# Patient Record
Sex: Male | Born: 1945
Health system: Southern US, Community
[De-identification: ages and names within clinical notes are randomized; demographics above are authoritative.]

## PROBLEM LIST (undated history)

## (undated) DIAGNOSIS — F419 Anxiety disorder, unspecified: Secondary | ICD-10-CM

## (undated) DIAGNOSIS — F32A Depression, unspecified: Secondary | ICD-10-CM

## (undated) DIAGNOSIS — K2270 Barrett's esophagus without dysplasia: Secondary | ICD-10-CM

## (undated) DIAGNOSIS — J9601 Acute respiratory failure with hypoxia: Secondary | ICD-10-CM

## (undated) DIAGNOSIS — G709 Myoneural disorder, unspecified: Secondary | ICD-10-CM

## (undated) DIAGNOSIS — G4730 Sleep apnea, unspecified: Secondary | ICD-10-CM

## (undated) DIAGNOSIS — I10 Essential (primary) hypertension: Secondary | ICD-10-CM

## (undated) DIAGNOSIS — K219 Gastro-esophageal reflux disease without esophagitis: Secondary | ICD-10-CM

## (undated) DIAGNOSIS — R112 Nausea with vomiting, unspecified: Secondary | ICD-10-CM

## (undated) DIAGNOSIS — E119 Type 2 diabetes mellitus without complications: Secondary | ICD-10-CM

## (undated) DIAGNOSIS — Z9889 Other specified postprocedural states: Secondary | ICD-10-CM

## (undated) DIAGNOSIS — M1990 Unspecified osteoarthritis, unspecified site: Secondary | ICD-10-CM

## (undated) DIAGNOSIS — F329 Major depressive disorder, single episode, unspecified: Secondary | ICD-10-CM

## (undated) DIAGNOSIS — N179 Acute kidney failure, unspecified: Secondary | ICD-10-CM

## (undated) DIAGNOSIS — K8000 Calculus of gallbladder with acute cholecystitis without obstruction: Secondary | ICD-10-CM

## (undated) HISTORY — DX: Acute respiratory failure with hypoxia: J96.01

## (undated) HISTORY — DX: Gastro-esophageal reflux disease without esophagitis: K21.9

## (undated) HISTORY — DX: Type 2 diabetes mellitus without complications: E11.9

## (undated) HISTORY — DX: Calculus of gallbladder with acute cholecystitis without obstruction: K80.00

## (undated) HISTORY — DX: Unspecified osteoarthritis, unspecified site: M19.90

## (undated) HISTORY — DX: Sleep apnea, unspecified: G47.30

## (undated) HISTORY — DX: Myoneural disorder, unspecified: G70.9

## (undated) HISTORY — PX: ESOPHAGOGASTRODUODENOSCOPY: SHX1529

## (undated) HISTORY — DX: Anxiety disorder, unspecified: F41.9

## (undated) HISTORY — PX: BACK SURGERY: SHX140

## (undated) HISTORY — PX: COLONOSCOPY: SHX174

## (undated) HISTORY — DX: Depression, unspecified: F32.A

## (undated) HISTORY — PX: REPLACEMENT TOTAL KNEE: SUR1224

## (undated) HISTORY — DX: Major depressive disorder, single episode, unspecified: F32.9

## (undated) HISTORY — PX: ROTATOR CUFF REPAIR: SHX139

## (undated) HISTORY — DX: Essential (primary) hypertension: I10

---

## 1999-09-24 ENCOUNTER — Inpatient Hospital Stay (HOSPITAL_COMMUNITY): Admission: EM | Admit: 1999-09-24 | Discharge: 1999-09-27 | Payer: Self-pay | Admitting: Emergency Medicine

## 1999-09-24 ENCOUNTER — Encounter: Payer: Self-pay | Admitting: Emergency Medicine

## 1999-09-25 ENCOUNTER — Encounter: Payer: Self-pay | Admitting: Nephrology

## 2000-07-30 ENCOUNTER — Ambulatory Visit (HOSPITAL_COMMUNITY): Admission: RE | Admit: 2000-07-30 | Discharge: 2000-07-30 | Payer: Self-pay | Admitting: Orthopedic Surgery

## 2000-07-30 ENCOUNTER — Encounter: Payer: Self-pay | Admitting: Orthopedic Surgery

## 2003-05-18 ENCOUNTER — Encounter: Admission: RE | Admit: 2003-05-18 | Discharge: 2003-08-16 | Payer: Self-pay | Admitting: Family Medicine

## 2003-07-12 ENCOUNTER — Encounter (INDEPENDENT_AMBULATORY_CARE_PROVIDER_SITE_OTHER): Payer: Self-pay | Admitting: *Deleted

## 2003-07-12 ENCOUNTER — Ambulatory Visit (HOSPITAL_COMMUNITY): Admission: RE | Admit: 2003-07-12 | Discharge: 2003-07-12 | Payer: Self-pay | Admitting: *Deleted

## 2006-07-23 ENCOUNTER — Encounter: Admission: RE | Admit: 2006-07-23 | Discharge: 2006-07-23 | Payer: Self-pay | Admitting: Orthopedic Surgery

## 2006-07-31 ENCOUNTER — Ambulatory Visit (HOSPITAL_BASED_OUTPATIENT_CLINIC_OR_DEPARTMENT_OTHER): Admission: RE | Admit: 2006-07-31 | Discharge: 2006-07-31 | Payer: Self-pay | Admitting: Orthopedic Surgery

## 2007-04-17 ENCOUNTER — Inpatient Hospital Stay (HOSPITAL_COMMUNITY): Admission: RE | Admit: 2007-04-17 | Discharge: 2007-04-20 | Payer: Self-pay | Admitting: Orthopedic Surgery

## 2009-03-12 ENCOUNTER — Ambulatory Visit: Payer: Self-pay | Admitting: Family Medicine

## 2009-03-12 DIAGNOSIS — I10 Essential (primary) hypertension: Secondary | ICD-10-CM

## 2009-03-12 DIAGNOSIS — R9431 Abnormal electrocardiogram [ECG] [EKG]: Secondary | ICD-10-CM

## 2009-03-12 HISTORY — DX: Abnormal electrocardiogram (ECG) (EKG): R94.31

## 2009-03-12 HISTORY — DX: Essential (primary) hypertension: I10

## 2009-03-14 ENCOUNTER — Encounter: Payer: Self-pay | Admitting: Family Medicine

## 2011-01-30 NOTE — H&P (Signed)
[name], SAR NO.:  [id number]   MEDICAL RECORD NO.:  [id number]           PATIENT TYPE:   LOCATION:                                 FACILITY:   PHYSICIAN:  Vania Rea. Supple, M.D.  DATE OF BIRTH:  March 26, 1946   DATE OF ADMISSION:  04/17/2007  DATE OF DISCHARGE:                              HISTORY & PHYSICAL   CHIEF COMPLAINT:  Left knee pain.   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  He has now failed outpatient conservative treatment, including  arthroscopy, as well as multiple injections, as well as treatment with a  functional osteoarthrosis knee brace.  He continues to have significant  pain and limitations and his ability to get around and it is impacting  his quality of life.  At this time, risks and benefits were discussed at  length for a total knee arthroplasty and he agrees and wishes to  proceed.   PAST MEDICAL HISTORY:  1. Non-insulin-dependent diabetes.  2. Hypertension.  3. Anxiety, depression.   PAST SURGICAL HISTORY:  1. Lumbar diskectomy x2.  2. Two previous right rotator cuff repairs.  3. Previous left knee arthroscopy.   CURRENT MEDICATIONS:  Include metformin, Glipizide, Nexium, Altace, a  diuretic he does not remember the name of and Xanax.   ALLERGIES:  NO KNOWN DRUG ALLERGIES.   SOCIAL HISTORY:  He does not drink or smoke.  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  He has family and friends that will be helping him  postoperatively in the home.  He has already been contacted by [place].   REVIEW OF SYSTEMS:  Patient denies recent fevers, chills, night sweats,  no bleeding tendency, has no blurred or double vision, history of  paralysis.  RESPIRATORY:  No shortness of breath, productive cough or  hemoptysis.  CARDIOVASCULAR:  No chest pain or orthopnea.  GI:  No  nausea, vomiting, constipation, occasional diarrhea, no melena or  bloody  stools.  GENITOURINARY:  No dysuria or hematuria.  MUSCULOSKELETAL:  As  per history of present illness.   PHYSICAL EXAMINATION:  GENERAL:  Timothy Chapman is a moderately overweight  65 year old gentleman in no apparent distress.  VITAL SIGNS:  Blood pressure today is 158/82.  HEENT:  Normocephalic.  Extraocular motions intact.  NECK:  Supple.  No  lymphadenopathy.  No carotid bruits.  CHEST:  Clear to auscultation bilaterally.  No rales, no rhonchi.  HEART:  Regular rate and rhythm.  ABDOMEN:  Moderately overweight, but positive bowel sounds.  Nontender.  EXTREMITIES:  Painful work of motion of the left knee with diffuse joint  line tenderness.  He is neurovascularly intact distally with no evidence  of pitting edema.   IMPRESSION:  1. Left knee osteoarthrosis.  2. Hypertension.  3. Non-insulin-dependent diabetes.  4. Anxiety, depression.   PLAN:  At this time, we will proceed with left total knee arthroplasty.      Tracy A. Shuford, P.A.-C.      Vania Rea. Supple, M.D.  Electronically Signed    TAS/MEDQ  D:  04/07/2007  T:  04/07/2007  Job:  [id number]

## 2011-01-30 NOTE — Op Note (Signed)
NAMECHRISTIANO, BLANDON NO.:  [id number]   MEDICAL RECORD NO.:  [id number]          PATIENT TYPE:  INP   LOCATION:  2899                         FACILITY:  [facility]   PHYSICIAN:  Vania Rea. Supple, M.D.  DATE OF BIRTH:  [birth year]   DATE OF PROCEDURE:  04/17/2007  DATE OF DISCHARGE:                               OPERATIVE REPORT   PREOPERATIVE DIAGNOSIS:  Left knee osteoarthrosis.   POSTOPERATIVE DIAGNOSIS:  Left knee osteoarthrosis.   PROCEDURE PERFORMED:  Left cemented total knee arthroplasty utilizing a  DePuy Sigma implant, size 4 femur, size 4 tibia, a 10 mm thick rotating  platform with polyethylene insert, and a 35 mm patellar button.   SURGEON:  Vania Rea. Supple, M.D.   ASSISTANT SURGEON:  French Ana A. Shuford, P.A.-C.   ANESTHESIA:  General endotracheal as well as a preoperative femoral  nerve block.   TOURNIQUET TIME:  Tourniquet time was one hour and 14 minutes.   ESTIMATED BLOOD LOSS:  150 mL.   DRAINS:  A Hemovac x one.   HISTORY:  Mr. Haub is a 65 year old gentleman who has chronic and  progressively increasing left knee pain secondary to underlying  arthrosis.  His symptoms had been refractory to prolonged attempts at  conservative management and, due to his ongoing pain and functional  limitations, he is brought to the operating room at this time for a  planned left total knee replacement as described below.   Preoperatively I counseled Mr. Bracknell on treatment options as well as  risks versus benefits throughout.  Possible surgical complications of  bleeding, infection, neurovascular injury, DVT, PE, persistence of pain,  loss of motion, and possible need for revision surgery were all  reviewed.  He understands and accepts and agrees with our plan.   DESCRIPTION OF PROCEDURE IN DETAIL:  After undergoing routine preop  evaluation, the patient received prophylactic antibiotics.  A femoral  nerve block was established in the holding area by the  anesthesia  department.  Placed supine on the operating table and underwent  establishment of a general endotracheal anesthesia.  Received  prophylactic antibiotics.  A Foley catheter was placed.  The tourniquet  was placed on the left thigh and the left leg was sterilely prepped and  draped in the standard fashion.  The leg was exsanguinated with the  tourniquet inflated to 350 mmHg.  An anterior midline incision was then  made approximately 15 cm in length centered over the patella.  Skin  flaps were circumferentially mobilized and sutured back.  A medial  peripatellar arthrotomy was performed.  The patella was everted and the  infrapatellar fat pad was excised and the patella was everted and the  knee was then flexed up.  Remnants of the menisci were removed and a  slight medial release was also performed of the capsule tissues from the  medial tibial plateau.  A drill was then used to gain access to the  femoral canal and an intramedullary guide was passed.  This was then  used to place a cutting guide for this femoral cut where we removed 11  mm of bone at a 5-degree valgus angulation.  The distal pin was then  measured and had a size 4 fit.  The size 4 cutting guide was then placed  and the anterior, posterior and Chamfer cuts were then made with an  oscillating saw.  A trial size 4 implant showed excellent fit.   Attention was then turned to the tibia where using an extramedullary  guide we removed 10 mm of bone from the tibia, maintaining neutral  alignment.  We then confirmed that the tibial plateau was appropriately  cut and exposed.  This was then trialed and the size 4 had the best fit.  With the size 4 tray pinned in position, we performed a trial reduction  and there were excellent soft tissue balance, excellent knee motion, and  symmetric flexion and extension gaps.  The tibia was then terminally  prepared with the reamer and broach.  Attention was then returned to the   distal femur where the box cutting guide was then used to resect the box  cut out of the distal femur.  We then trialed the implant with the box  and it showed excellent fit.   Attention was then turned to the patella where an oscillating saw was  then used to resect approximately 8.5 mm of bone, and then the  stabilizing drill holes were drilled.  The patellar button, 35 mm,  showed a good fit.  At this point the posterior femoral condyles were  then cleaned and an osteotome was used to remove the bony prominences  and then a rongeur was used to debride the residual soft tissue and  meniscus from the posteromedial and posterolateral compartments of all  residual bone and debris.  The joint was then pulsatilely lavaged and  meticulously cleaned.   Cement was then mixed and at the appropriate consistency we cemented the  implants into position, beginning with the tibia and then the femur and  then the patella.  Meticulous removal of all extra cement was completed.  Once again, trial reduction showed excellent soft tissue balance, good  knee mobility, and good stability.  The trial was removed and the final  10 mm rotating platform with polyethylene insert was then placed.  The  knee was taken through a final range of motion with good patellar  stability and good tracking.  A Hemovac drain was brought out  superolaterally.  The tourniquet was let down.  Hemostasis was obtained.  The peripatellar arthrotomy was closed with a series of figure-of-eight  #1 Vicryl sutures; 2-0 Vicryl was used for the subcu and the  intracuticular; with 3-0 Monocryl for skin; followed by Steri-Strips.  A  dry dressing was applied.  The patient was placed in a knee immobilizer  and then extubated and taken to the recovery room in stable condition.      Vania Rea. Supple, M.D.  Electronically Signed     KMS/MEDQ  D:  04/17/2007  T:  04/17/2007  Job:  [id number]

## 2011-02-02 NOTE — Discharge Summary (Signed)
NAMEDIETER, HANE NO.:  [id number]   MEDICAL RECORD NO.:  [id number]          PATIENT TYPE:  INP   LOCATION:  5020                         FACILITY:  [facility]   PHYSICIAN:  Vania Rea. Supple, M.D.  DATE OF BIRTH:  May 05, 1946   DATE OF ADMISSION:  04/17/2007  DATE OF DISCHARGE:  04/20/2007                               DISCHARGE SUMMARY   ADMISSION DIAGNOSES:  1. End-stage osteoarthrosis of the left knee.  2. Non-insulin-dependent diabetes.  3. Hypertension.  4. Anxiety and depression.   DISCHARGE DIAGNOSES:  1. End-stage osteoarthritis of the left knee, status post left total      knee arthroplasty.  2. Non-insulin-dependent diabetes.  3. Hypertension.  4. Anxiety and depression.   OPERATIONS:  Left total knee arthroplasty.   SURGEON:  Dr. Francena Hanly.   ASSISTANT:  Lucita Lora. Shuford, PA-C.   ANESTHESIA:  General anesthetic with femoral nerve block.   BRIEF HISTORY:  Mr. Vallejo is a pleasant 65 year old gentleman who has  failed outpatient conservative measures for ongoing difficulties with  his left knee.  He has been found to have end-stage osteoarthrosis by  radiograph.  His quality of life was significantly affected, and,  therefore, a total knee arthroplasty was indicated.  Risks and benefits  discussed with the patient, and he is in agreement and wishes to  proceed.   HOSPITAL COURSE:  The patient was admitted, underwent above-named  procedure, and tolerated it well.  All appropriate IV antibiotics,  analgesics were provided.  Postoperatively, he was placed on DVT and PE  prophylaxis with Coumadin and started in total knee arthroplasty  protocol.  He was allowed weightbearing as tolerated.  He did have mild  hyponatremia which resolved with free water restriction.  Otherwise, he  hemodynamically remained stable.  He did have significant pain control  difficulties initially.  These were improved throughout his first  postoperative weekend.  By  date April 20, 2007, he was doing well except  for he had had no bowel movement.  He was passing flatus, is  neurovascularly intact, was afebrile, and he had good results with  laxative and enemas.  His incision was clean and dry, had minimal  swelling.  He was found to be medically and orthopedically stable and  ready for discharge to home.   LABORATORY DATA:  Admission labs:  Hemogram within normal limits, a  hemoglobin of 14.5; postoperatively remained stable at 11.2, 10.4, and  10.2.  Chemistries also in the chart showed mild hyponatremia at 133.  Pro-times, INRs followed by pharmacy on Coumadin, and urinalysis  negative.  EKG showed normal sinus rhythm.  Chest x-ray not found at  time of dictation.   CONDITION ON DISCHARGE:  Stable and improved.   DISCHARGE PLAN:  Discharge to home and resume his home medicines other  than  antiinflammatories, Coumadin for a total of 3 weeks.  Prescriptions for Percocet, Robaxin, Ambien, and Coumadin are provided.  He has [facility] providing home health services.  Encouraged to  follow up in our office in 2 weeks.  May shower on postoperative day  5.  Call our office for any further details.      Tracy A. Shuford, P.A.-C.      Vania Rea. Supple, M.D.  Electronically Signed    TAS/MEDQ  D:  06/19/2007  T:  06/19/2007  Job:  [id number]

## 2011-02-02 NOTE — Op Note (Signed)
Timothy Chapman, Timothy Chapman               ACCOUNT NO.:  [id number]   MEDICAL RECORD NO.:  [id number]          PATIENT TYPE:  AMB   LOCATION:  DSC                          FACILITY:  [facility]   PHYSICIAN:  Mila Homer. Sherlean Foot, M.D. DATE OF BIRTH:  [birth year]   DATE OF PROCEDURE:  DATE OF DISCHARGE:                                 OPERATIVE REPORT   PREOPERATIVE DIAGNOSES:  Left knee osteoarthritis, medial meniscal tear.   POSTOPERATIVE DIAGNOSES:  Left knee osteoarthritis, Plica syndrome.   SURGEON:  Mila Homer. Sherlean Foot, M.D.   ASSISTANT:  None.   ANESTHESIA:  General, laryngeal mask anesthesia.   INDICATION FOR PROCEDURE:  Patient is a 65 year-old with cancer symptoms  plus more chronic arthritic symptoms. MRI shows medial meniscus tear.  Informed consent obtained.   DESCRIPTION OF PROCEDURE:  The patient was laid supine and administered  general LMA anesthesia.  The left lower extremity was prepped and draped in  the usual sterile fashion.  Anterolateral and anteromedial portals were  created with a #11 blade, blunt trocar, and cannula.  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 There was a very large synovitic hypertrophied  plica, and I performed a plica resection with the [employer] shaver.  I  went into flexion, and there was completely eburnated bone over much of the  medial formal condyle, loose pieces floating in the nave from this lesion,  obviously the articular cartilage had been flaking off from this lesion for  quite some time.  There was a posterior horn medial meniscal root tear.  The  loose debris was debrided with the [facility] shaver and then the partial  medial meniscectomy was performed with the [employer] shaver and straight  vascular forceps.  The lateral compartment was actually pretty normal with  only grade 1 chondromalacia.  The ACL and PCL were normal as well.  I  performed an  aggressive chondroplasty of the medial femoral condylar lesion.  It measured 4 x 4 cm in circumference.  I then used a 0.62 mm K-wire and  drilled approximately 10 holes in this lesion.  Then, lavaged and closed  with 4-0 nylon suture.  The wound was dressed with Xeroform dressing,  sponges, Webril, and an Ace wrap.           ______________________________  Mila Homer. Sherlean Foot, M.D.     SDL/MEDQ  D:  07/31/2006  T:  08/01/2006  Job:  [id number]

## 2011-07-02 LAB — CBC
HCT: 33.2 — ABNORMAL LOW
HCT: 42.3
Hemoglobin: 10.2 — ABNORMAL LOW
Hemoglobin: 11.2 — ABNORMAL LOW
MCHC: 33.6
MCHC: 34.4
MCHC: 35.1
MCV: 83.9
MCV: 84.1
MCV: 83.8
Platelets: 218
Platelets: 235
Platelets: 304
RBC: 3.61 — ABNORMAL LOW
RDW: 13.2
RDW: 13.5
RDW: 13.5
RDW: 13.4
WBC: 8.4

## 2011-07-02 LAB — DIFFERENTIAL
Eosinophils Relative: 2
Lymphocytes Relative: 27
Monocytes Absolute: 0.6
Monocytes Relative: 8
Neutro Abs: 5.3

## 2011-07-02 LAB — URINALYSIS, ROUTINE W REFLEX MICROSCOPIC
Ketones, ur: 15 — AB
Nitrite: NEGATIVE
Specific Gravity, Urine: 1.03
pH: 5

## 2011-07-02 LAB — BASIC METABOLIC PANEL
BUN: 12
BUN: 9
CO2: 27
CO2: 30
Calcium: 8.6
Chloride: 99
Creatinine, Ser: 0.85
GFR calc non Af Amer: >60
Glucose, Bld: 163 — ABNORMAL HIGH
Glucose, Bld: 173 — ABNORMAL HIGH
Potassium: 4.2
Sodium: 133 — ABNORMAL LOW

## 2011-07-02 LAB — PROTIME-INR
INR: 1.9 — ABNORMAL HIGH
INR: 1.9 — ABNORMAL HIGH
Prothrombin Time: 22.4 — ABNORMAL HIGH

## 2011-07-02 LAB — COMPREHENSIVE METABOLIC PANEL
AST: 28
Albumin: 4.1
BUN: 25 — ABNORMAL HIGH
Creatinine, Ser: 1.1
GFR calc Af Amer: >60
Potassium: 4.2
Total Protein: 7.2

## 2011-07-02 LAB — APTT: aPTT: 36

## 2014-08-26 ENCOUNTER — Other Ambulatory Visit (HOSPITAL_COMMUNITY): Payer: Self-pay | Admitting: Neurosurgery

## 2014-09-29 ENCOUNTER — Inpatient Hospital Stay (HOSPITAL_COMMUNITY): Admission: RE | Admit: 2014-09-29 | Payer: Self-pay | Source: Ambulatory Visit

## 2014-10-08 ENCOUNTER — Encounter (HOSPITAL_COMMUNITY): Admission: RE | Payer: Self-pay | Source: Ambulatory Visit

## 2014-10-08 ENCOUNTER — Inpatient Hospital Stay (HOSPITAL_COMMUNITY): Admission: RE | Admit: 2014-10-08 | Payer: Self-pay | Source: Ambulatory Visit | Admitting: Neurosurgery

## 2014-10-08 SURGERY — POSTERIOR LUMBAR FUSION 1 LEVEL
Anesthesia: General

## 2015-04-25 DIAGNOSIS — Z5189 Encounter for other specified aftercare: Secondary | ICD-10-CM | POA: Insufficient documentation

## 2015-04-25 HISTORY — DX: Encounter for other specified aftercare: Z51.89

## 2015-09-18 HISTORY — PX: PROSTATE SURGERY: SHX751

## 2015-09-26 DIAGNOSIS — E291 Testicular hypofunction: Secondary | ICD-10-CM

## 2015-09-26 DIAGNOSIS — R972 Elevated prostate specific antigen [PSA]: Secondary | ICD-10-CM

## 2015-09-26 HISTORY — DX: Elevated prostate specific antigen (PSA): R97.20

## 2015-09-26 HISTORY — DX: Testicular hypofunction: E29.1

## 2015-10-06 DIAGNOSIS — E1142 Type 2 diabetes mellitus with diabetic polyneuropathy: Secondary | ICD-10-CM | POA: Insufficient documentation

## 2015-10-06 DIAGNOSIS — F5232 Male orgasmic disorder: Secondary | ICD-10-CM

## 2015-10-06 HISTORY — DX: Type 2 diabetes mellitus with diabetic polyneuropathy: E11.42

## 2015-10-06 HISTORY — DX: Male orgasmic disorder: F52.32

## 2016-01-25 ENCOUNTER — Telehealth: Payer: Self-pay

## 2016-01-25 NOTE — Telephone Encounter (Signed)
Felisha from [facility] - called in to get pt established with provider. She says that pt will be traveling to the [place] and would like to know if he can have Chloroquine 90 day supply? (covered by insurance)

## 2016-02-07 ENCOUNTER — Telehealth: Payer: Self-pay

## 2016-02-07 NOTE — Telephone Encounter (Signed)
Pre visit call made to patient.

## 2016-02-08 ENCOUNTER — Encounter: Payer: Self-pay | Admitting: Family Medicine

## 2016-02-08 ENCOUNTER — Ambulatory Visit (INDEPENDENT_AMBULATORY_CARE_PROVIDER_SITE_OTHER): Payer: Medicare Other | Admitting: Family Medicine

## 2016-02-08 VITALS — BP 130/84 | HR 92 | Temp 98.9°F | Ht 69.75 in | Wt 254.2 lb

## 2016-02-08 DIAGNOSIS — K219 Gastro-esophageal reflux disease without esophagitis: Secondary | ICD-10-CM

## 2016-02-08 DIAGNOSIS — N529 Male erectile dysfunction, unspecified: Secondary | ICD-10-CM

## 2016-02-08 DIAGNOSIS — G4700 Insomnia, unspecified: Secondary | ICD-10-CM

## 2016-02-08 DIAGNOSIS — Z119 Encounter for screening for infectious and parasitic diseases, unspecified: Secondary | ICD-10-CM | POA: Diagnosis not present

## 2016-02-08 DIAGNOSIS — N400 Enlarged prostate without lower urinary tract symptoms: Secondary | ICD-10-CM

## 2016-02-08 DIAGNOSIS — Z8719 Personal history of other diseases of the digestive system: Secondary | ICD-10-CM

## 2016-02-08 DIAGNOSIS — E1140 Type 2 diabetes mellitus with diabetic neuropathy, unspecified: Secondary | ICD-10-CM

## 2016-02-08 DIAGNOSIS — G8929 Other chronic pain: Secondary | ICD-10-CM

## 2016-02-08 DIAGNOSIS — E785 Hyperlipidemia, unspecified: Secondary | ICD-10-CM

## 2016-02-08 DIAGNOSIS — Z7189 Other specified counseling: Secondary | ICD-10-CM

## 2016-02-08 DIAGNOSIS — M549 Dorsalgia, unspecified: Secondary | ICD-10-CM | POA: Diagnosis not present

## 2016-02-08 DIAGNOSIS — F5101 Primary insomnia: Secondary | ICD-10-CM

## 2016-02-08 DIAGNOSIS — Z7184 Encounter for health counseling related to travel: Secondary | ICD-10-CM

## 2016-02-08 DIAGNOSIS — I10 Essential (primary) hypertension: Secondary | ICD-10-CM

## 2016-02-08 HISTORY — DX: Type 2 diabetes mellitus with diabetic neuropathy, unspecified: E11.40

## 2016-02-08 HISTORY — DX: Insomnia, unspecified: G47.00

## 2016-02-08 HISTORY — DX: Personal history of other diseases of the digestive system: Z87.19

## 2016-02-08 HISTORY — DX: Other chronic pain: G89.29

## 2016-02-08 LAB — COMPREHENSIVE METABOLIC PANEL
ALK PHOS: 37 U/L — AB (ref 39–117)
ALT: 41 U/L (ref 0–53)
AST: 27 U/L (ref 0–37)
Albumin: 4.5 g/dL (ref 3.5–5.2)
BILIRUBIN TOTAL: 0.4 mg/dL (ref 0.2–1.2)
BUN: 27 mg/dL — AB (ref 6–23)
CALCIUM: 9.9 mg/dL (ref 8.4–10.5)
CO2: 25 meq/L (ref 19–32)
CREATININE: 1.08 mg/dL (ref 0.40–1.50)
Chloride: 102 mEq/L (ref 96–112)
GFR: 72.37 mL/min (ref >60.00)
GLUCOSE: 162 mg/dL — AB (ref 70–99)
Potassium: 4.3 mEq/L (ref 3.5–5.1)
Sodium: 137 mEq/L (ref 135–145)
Total Protein: 7.2 g/dL (ref 6.0–8.3)

## 2016-02-08 LAB — LIPID PANEL
Cholesterol: 180 mg/dL (ref 0–200)
HDL: 24.3 mg/dL — AB (ref >39.00)
NONHDL: 155.58
Total CHOL/HDL Ratio: 7
Triglycerides: 394 mg/dL — ABNORMAL HIGH (ref 0.0–149.0)
VLDL: 78.8 mg/dL — ABNORMAL HIGH (ref 0.0–40.0)

## 2016-02-08 LAB — PSA: PSA: 5.11 ng/mL — ABNORMAL HIGH (ref 0.10–4.00)

## 2016-02-08 LAB — LDL CHOLESTEROL, DIRECT: LDL DIRECT: 104 mg/dL

## 2016-02-08 LAB — HEMOGLOBIN A1C: Hgb A1c MFr Bld: 7.2 % — ABNORMAL HIGH (ref 4.6–6.5)

## 2016-02-08 MED ORDER — ALPRAZOLAM 0.5 MG PO TABS: ORAL_TABLET | ORAL | Status: DC

## 2016-02-08 MED ORDER — NIACIN ER (ANTIHYPERLIPIDEMIC) 500 MG PO TBCR: 500.0000 mg | EXTENDED_RELEASE_TABLET | Freq: Every day | ORAL | Status: DC

## 2016-02-08 MED ORDER — DOXYCYCLINE HYCLATE 100 MG PO TABS: 100.0000 mg | ORAL_TABLET | Freq: Every day | ORAL | Status: DC

## 2016-02-08 MED ORDER — HYDROCODONE-ACETAMINOPHEN 7.5-325 MG PO TABS: 1.0000 | ORAL_TABLET | Freq: Three times a day (TID) | ORAL | Status: DC | PRN

## 2016-02-08 MED FILL — HYDROCODON-APAP 7.5-325: 7.5-325 | 14 days supply | Qty: 40 | Fill #0

## 2016-02-08 NOTE — Progress Notes (Signed)
[facility] [address] [place], [place] [address] [phone] [id number]  Date:  02/08/2016   Name:  Johaan [facility]   DOB:  Oct 14, 1945   MRN:  [id number]  PCP:  Lamar Blinks, MD    Chief Complaint: Establish Care   History of Present Illness:  Timothy Chapman is a 70 y.o. very pleasant male patient who presents with the following:  Here today to establish care.  History of HTN, DM, GERD, OSA. He has decided to see [place] as his PCP. His DM is treated with glipizide, victoza, metformin He states that his biggest issue is chronic insomnia- he uses melatonin for this.  He has used xanax for this in the past- he has tried trazodone but this did not seem to work.  He prefers the xanax and would like to have some but the [place] will not rx this for him   He is also a pt at the [place].   He will visit the [place] soon and needs malaria prophylaxis. He is engaged to a [place] woman and may be moving there if she does not want to come back to the [place] He will be there 3-6 months if not permanently.   He does have spinal stenosis, has had back surgery a couple of times and some sort of nerve deadening procedure "ablation" not too long ago.  This ablation did seem to help him but can only be repeated every 6 months.   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    Has not had an rx in some time but would like to have on hand for his trip Nothing on [place] for him  He has a urologist Dr. Amalia Hailey at [facility] - will do his PSA today  He was taking xanax 0.5 and he took half up to 3x a week for sleep as needed He is not on cholesterol med- has been unable to tolerate statins due to leg cramps.    Patient Active Problem List   Diagnosis Date Noted  . HYPERTENSION, BENIGN  ESSENTIAL, UNCONTROLLED 03/12/2009  . ABNORMAL ELECTROCARDIOGRAM 03/12/2009    Past Medical History  Diagnosis Date  . Anxiety   . Arthritis   . Depression   . Diabetes mellitus without complication ([place])   . GERD (gastroesophageal reflux disease)   . Sleep apnea   . Hypertension   . Neuromuscular disorder [facility])     Past Surgical History  Procedure Laterality Date  . Rotator cuff repair    . Back surgery      X2  . Replacement total knee Left     Social History  Substance Use Topics  . Smoking status: Never Smoker   . Smokeless tobacco: Never Used  . Alcohol Use: 0.0 oz/week    0 Standard drinks or equivalent per week     Comment: Rarely    Family History  Problem Relation Age of Onset  . Heart failure Father   . Heart disease Father   . Emphysema Mother     Allergies  Allergen Reactions  . Codeine     Medication list has been reviewed and updated.  No current outpatient prescriptions on file prior to visit.   No current facility-administered  medications on file prior to visit.    Review of Systems:  As per HPI- otherwise negative.   Physical Examination: Filed Vitals:   02/08/16 1040 02/08/16 1047  BP: 150/92 130/84  Pulse: 92   Temp: 98.9 F (37.2 C)    Filed Vitals:   02/08/16 1040  Height: 5' 9.75" (1.772 m)  Weight: 254 lb 3.2 oz (115.304 kg)   Body mass index is 36.72 kg/(m^2). Ideal Body Weight: Weight in (lb) to have BMI = 25: 172.6  GEN: WDWN, NAD, Non-toxic, A & O x 3, central obesity, looks well HEENT: Atraumatic, Normocephalic. Neck supple. No masses, No LAD.  Bilateral TM wnl, oropharynx normal.  PEERL,EOMI.   Ears and Nose: No external deformity. CV: RRR, No M/G/R. No JVD. No thrill. No extra heart sounds. PULM: CTA B, no wheezes, crackles, rhonchi. No retractions. No resp. distress. No accessory muscle use. EXTR: No c/c/e NEURO Normal gait.  PSYCH: Normally interactive. Conversant. Not depressed or anxious appearing.  Calm  demeanor.   He recently visited the [facility] and got oral typhoid vaccine and hep A vaccine Assessment and Plan: Controlled type 2 diabetes mellitus with diabetic neuropathy, without long-term current use of insulin ([place]) - Plan: Hemoglobin A1c, Comprehensive metabolic panel, Lipid panel  Chronic back pain - Plan: HYDROcodone-acetaminophen (NORCO) 7.5-325 MG tablet  BPH (benign prostatic hyperplasia) - Plan: PSA  Screening examination for infectious disease - Plan: Hepatitis C antibody  Travel advice encounter - Plan: doxycycline (VIBRA-TABS) 100 MG tablet  Insomnia - Plan: ALPRAZolam (XANAX) 0.5 MG tablet  History of diverticulitis  Here today as a new patient with a few concerns Labs to monitor his DM as above Refilled his norco and gave a supply of xanax to use if needed - cautioned regarding sedation and against using these meds together Doxycycline for malaria prophylaxis  Signed Lamar Blinks, MD  Received his labs and called him- he is willing to try niacin for his lipids. Will rx for him  Results for orders placed or performed in visit on 02/08/16  Hemoglobin A1c  Result Value Ref [place]   Hgb A1c MFr Bld 7.2 (H) 4.6 - 6.5 %  PSA  Result Value Ref [place]   PSA 5.11 (H) 0.10 - 4.00 ng/mL  Comprehensive metabolic panel  Result Value Ref [place]   Sodium 137 135 - 145 mEq/L   Potassium 4.3 3.5 - 5.1 mEq/L   Chloride 102 96 - 112 mEq/L   CO2 25 19 - 32 mEq/L   Glucose, Bld 162 (H) 70 - 99 mg/dL   BUN 27 (H) 6 - 23 mg/dL   Creatinine, Ser 1.08 0.40 - 1.50 mg/dL   Total Bilirubin 0.4 0.2 - 1.2 mg/dL   Alkaline Phosphatase 37 (L) 39 - 117 U/L   AST 27 0 - 37 U/L   ALT 41 0 - 53 U/L   Total Protein 7.2 6.0 - 8.3 g/dL   Albumin 4.5 3.5 - 5.2 g/dL   Calcium 9.9 8.4 - 10.5 mg/dL   GFR 72.37 >60.00 mL/min  Lipid panel  Result Value Ref [place]   Cholesterol 180 0 - 200 mg/dL   Triglycerides 394.0 (H) 0.0 - 149.0 mg/dL   HDL 24.30 (L) >39.00 mg/dL   VLDL 78.8 (H) 0.0 - 40.0  mg/dL   Total CHOL/HDL Ratio 7    NonHDL 155.58   LDL cholesterol, direct  Result Value Ref [place]   Direct LDL 104.0 mg/dL

## 2016-02-08 NOTE — Patient Instructions (Signed)
It was nice to meet you today- enjoy your upcoming trip! Start on doxycycline 1-2 days prior to malaria exposure and continue for 4 weeks once home Use the xanax sparingly for sleep, and the pain medication sparingly for pain  I will be in touch with your labs.  If you are still in the [place] let's plan to recheck in 4-6 months

## 2016-02-08 NOTE — Progress Notes (Signed)
Pre visit review using our clinic tool,if applicable. No additional management support is needed unless otherwise documented below in the visit note.  

## 2016-02-09 LAB — HEPATITIS C ANTIBODY: HCV Ab: NEGATIVE

## 2016-02-28 ENCOUNTER — Telehealth: Payer: Self-pay | Admitting: Family Medicine

## 2016-02-28 NOTE — Telephone Encounter (Signed)
Relationship to patient: self Can be reached: [phone]  Reason for call: pt is going to [place] in September and refrigeration of victoza will be an issue. He is asking if there are alternatives. Please f/u with pt.

## 2016-02-28 NOTE — Telephone Encounter (Signed)
Called him back-there is nothing in the same class of meds as victoza that does not need refrigeration.  We could consider changing to something like [place] but it may not work quite as well.  Let me know what he thinks

## 2016-03-01 ENCOUNTER — Encounter: Payer: Self-pay | Admitting: Family Medicine

## 2016-03-21 DIAGNOSIS — H524 Presbyopia: Secondary | ICD-10-CM

## 2016-03-21 DIAGNOSIS — E113292 Type 2 diabetes mellitus with mild nonproliferative diabetic retinopathy without macular edema, left eye: Secondary | ICD-10-CM

## 2016-03-21 DIAGNOSIS — H52223 Regular astigmatism, bilateral: Secondary | ICD-10-CM

## 2016-03-21 DIAGNOSIS — H2513 Age-related nuclear cataract, bilateral: Secondary | ICD-10-CM

## 2016-03-21 DIAGNOSIS — H5213 Myopia, bilateral: Secondary | ICD-10-CM | POA: Insufficient documentation

## 2016-03-21 HISTORY — DX: Type 2 diabetes mellitus with mild nonproliferative diabetic retinopathy without macular edema, left eye: E11.3292

## 2016-03-21 HISTORY — DX: Regular astigmatism, bilateral: H52.223

## 2016-03-21 HISTORY — DX: Presbyopia: H52.4

## 2016-03-21 HISTORY — DX: Age-related nuclear cataract, bilateral: H25.13

## 2016-03-21 HISTORY — DX: Myopia, bilateral: H52.13

## 2016-05-02 DIAGNOSIS — E291 Testicular hypofunction: Secondary | ICD-10-CM | POA: Diagnosis not present

## 2016-05-02 DIAGNOSIS — N138 Other obstructive and reflux uropathy: Secondary | ICD-10-CM | POA: Diagnosis not present

## 2016-05-02 DIAGNOSIS — N401 Enlarged prostate with lower urinary tract symptoms: Secondary | ICD-10-CM | POA: Diagnosis not present

## 2016-05-02 DIAGNOSIS — R972 Elevated prostate specific antigen [PSA]: Secondary | ICD-10-CM | POA: Diagnosis not present

## 2016-05-21 ENCOUNTER — Other Ambulatory Visit: Payer: Self-pay | Admitting: Family Medicine

## 2016-05-21 DIAGNOSIS — Z7184 Encounter for health counseling related to travel: Secondary | ICD-10-CM

## 2016-05-22 ENCOUNTER — Encounter: Payer: Self-pay | Admitting: Family Medicine

## 2016-05-22 ENCOUNTER — Other Ambulatory Visit: Payer: Self-pay | Admitting: Emergency Medicine

## 2016-05-22 DIAGNOSIS — Z7184 Encounter for health counseling related to travel: Secondary | ICD-10-CM

## 2016-05-22 DIAGNOSIS — E1140 Type 2 diabetes mellitus with diabetic neuropathy, unspecified: Secondary | ICD-10-CM

## 2016-05-22 MED ORDER — DOXYCYCLINE HYCLATE 100 MG PO TABS: 100.0000 mg | ORAL_TABLET | Freq: Every day | ORAL | 0 refills | Status: DC

## 2016-05-22 NOTE — Telephone Encounter (Signed)
Received refill request for DOXYCYCLINE 100MG  TAB HYCL. Pt last office visit and refill was 02/08/16. Is it ok to refill? Please advise.

## 2016-05-23 ENCOUNTER — Encounter: Payer: Self-pay | Admitting: Family Medicine

## 2016-05-23 MED ORDER — METFORMIN HCL ER (OSM) 1000 MG PO TB24: 1000.0000 mg | ORAL_TABLET | Freq: Two times a day (BID) | ORAL | 2 refills | Status: DC

## 2016-05-23 MED ORDER — GLIPIZIDE 10 MG PO TABS
10.0000 mg | ORAL_TABLET | Freq: Two times a day (BID) | ORAL | 2 refills | Status: DC
Start: 2016-05-23 — End: 2017-07-08

## 2016-05-30 ENCOUNTER — Other Ambulatory Visit: Payer: Self-pay | Admitting: Emergency Medicine

## 2016-05-30 DIAGNOSIS — G4700 Insomnia, unspecified: Secondary | ICD-10-CM

## 2016-05-30 DIAGNOSIS — N138 Other obstructive and reflux uropathy: Secondary | ICD-10-CM

## 2016-05-30 HISTORY — DX: Other obstructive and reflux uropathy: N13.8

## 2016-05-30 MED ORDER — ALPRAZOLAM 0.5 MG PO TABS: ORAL_TABLET | ORAL | 2 refills | Status: DC

## 2016-05-30 NOTE — Telephone Encounter (Signed)
Received refill request from [facility] for refill on Xanax Last office visit and refill on  02/08/16

## 2016-06-04 ENCOUNTER — Other Ambulatory Visit: Payer: Self-pay | Admitting: Family Medicine

## 2016-06-04 DIAGNOSIS — G8929 Other chronic pain: Secondary | ICD-10-CM

## 2016-06-04 DIAGNOSIS — M549 Dorsalgia, unspecified: Secondary | ICD-10-CM

## 2016-06-04 DIAGNOSIS — E1140 Type 2 diabetes mellitus with diabetic neuropathy, unspecified: Secondary | ICD-10-CM

## 2016-06-04 DIAGNOSIS — I10 Essential (primary) hypertension: Secondary | ICD-10-CM

## 2016-06-04 NOTE — Telephone Encounter (Signed)
Refill request for hydrochlorothiazide, gabapentin, diclofenac   metformin- he says that his insurance will not cover the new Rx because it has OSM. He would like to have the exact same from before     Pharmacy: [place], [place]

## 2016-06-05 ENCOUNTER — Encounter: Payer: Self-pay | Admitting: Family Medicine

## 2016-06-05 MED ORDER — METFORMIN HCL 1000 MG PO TABS: 1000.0000 mg | ORAL_TABLET | Freq: Two times a day (BID) | ORAL | 3 refills | Status: DC

## 2016-06-06 DIAGNOSIS — N138 Other obstructive and reflux uropathy: Secondary | ICD-10-CM | POA: Diagnosis not present

## 2016-06-06 DIAGNOSIS — N401 Enlarged prostate with lower urinary tract symptoms: Secondary | ICD-10-CM | POA: Diagnosis not present

## 2016-06-08 MED ORDER — HYDROCHLOROTHIAZIDE 25 MG PO TABS: 25.0000 mg | ORAL_TABLET | Freq: Every day | ORAL | 3 refills | Status: DC

## 2016-06-08 MED ORDER — GABAPENTIN 300 MG PO CAPS: 300.0000 mg | ORAL_CAPSULE | Freq: Three times a day (TID) | ORAL | 2 refills | Status: DC

## 2016-06-08 MED ORDER — DICLOFENAC SODIUM 75 MG PO TBEC: 75.0000 mg | DELAYED_RELEASE_TABLET | Freq: Two times a day (BID) | ORAL | 3 refills | Status: DC

## 2016-06-08 NOTE — Addendum Note (Signed)
Addended by: Lamar Blinks C on: 06/08/2016 11:39 AM   Modules accepted: Orders

## 2016-06-12 DIAGNOSIS — E1142 Type 2 diabetes mellitus with diabetic polyneuropathy: Secondary | ICD-10-CM | POA: Diagnosis not present

## 2016-06-12 DIAGNOSIS — N401 Enlarged prostate with lower urinary tract symptoms: Secondary | ICD-10-CM | POA: Diagnosis not present

## 2016-06-12 DIAGNOSIS — Z79899 Other long term (current) drug therapy: Secondary | ICD-10-CM | POA: Diagnosis not present

## 2016-06-12 DIAGNOSIS — E113292 Type 2 diabetes mellitus with mild nonproliferative diabetic retinopathy without macular edema, left eye: Secondary | ICD-10-CM | POA: Diagnosis not present

## 2016-06-12 DIAGNOSIS — M1990 Unspecified osteoarthritis, unspecified site: Secondary | ICD-10-CM | POA: Diagnosis not present

## 2016-06-12 DIAGNOSIS — Z87891 Personal history of nicotine dependence: Secondary | ICD-10-CM | POA: Diagnosis not present

## 2016-06-12 DIAGNOSIS — K219 Gastro-esophageal reflux disease without esophagitis: Secondary | ICD-10-CM | POA: Diagnosis not present

## 2016-06-12 DIAGNOSIS — Z7984 Long term (current) use of oral hypoglycemic drugs: Secondary | ICD-10-CM | POA: Diagnosis not present

## 2016-06-12 DIAGNOSIS — N138 Other obstructive and reflux uropathy: Secondary | ICD-10-CM | POA: Diagnosis not present

## 2016-06-12 DIAGNOSIS — I10 Essential (primary) hypertension: Secondary | ICD-10-CM | POA: Diagnosis not present

## 2016-06-12 DIAGNOSIS — G4730 Sleep apnea, unspecified: Secondary | ICD-10-CM | POA: Diagnosis not present

## 2016-06-18 DIAGNOSIS — N401 Enlarged prostate with lower urinary tract symptoms: Secondary | ICD-10-CM | POA: Diagnosis not present

## 2016-06-18 DIAGNOSIS — N138 Other obstructive and reflux uropathy: Secondary | ICD-10-CM | POA: Diagnosis not present

## 2016-06-20 ENCOUNTER — Ambulatory Visit (INDEPENDENT_AMBULATORY_CARE_PROVIDER_SITE_OTHER): Payer: Medicare Other | Admitting: Family Medicine

## 2016-06-20 ENCOUNTER — Encounter: Payer: Self-pay | Admitting: Family Medicine

## 2016-06-20 VITALS — BP 129/72 | HR 66 | Temp 98.6°F | Ht 69.75 in | Wt 243.4 lb

## 2016-06-20 DIAGNOSIS — E1140 Type 2 diabetes mellitus with diabetic neuropathy, unspecified: Secondary | ICD-10-CM

## 2016-06-20 DIAGNOSIS — I10 Essential (primary) hypertension: Secondary | ICD-10-CM

## 2016-06-20 DIAGNOSIS — Z7184 Encounter for health counseling related to travel: Secondary | ICD-10-CM

## 2016-06-20 DIAGNOSIS — E785 Hyperlipidemia, unspecified: Secondary | ICD-10-CM

## 2016-06-20 DIAGNOSIS — Z7189 Other specified counseling: Secondary | ICD-10-CM

## 2016-06-20 DIAGNOSIS — Z862 Personal history of diseases of the blood and blood-forming organs and certain disorders involving the immune mechanism: Secondary | ICD-10-CM | POA: Diagnosis not present

## 2016-06-20 DIAGNOSIS — N401 Enlarged prostate with lower urinary tract symptoms: Secondary | ICD-10-CM | POA: Diagnosis not present

## 2016-06-20 DIAGNOSIS — R350 Frequency of micturition: Secondary | ICD-10-CM

## 2016-06-20 DIAGNOSIS — Z23 Encounter for immunization: Secondary | ICD-10-CM | POA: Diagnosis not present

## 2016-06-20 LAB — CBC
HEMATOCRIT: 38.3 % — AB (ref 39.0–52.0)
Hemoglobin: 12.9 g/dL — ABNORMAL LOW (ref 13.0–17.0)
MCHC: 33.6 g/dL (ref 30.0–36.0)
MCV: 85.5 fl (ref 78.0–100.0)
PLATELETS: 287 10*3/uL (ref 150.0–400.0)
RBC: 4.48 Mil/uL (ref 4.22–5.81)
RDW: 14 % (ref 11.5–15.5)
WBC: 8.2 10*3/uL (ref 4.0–10.5)

## 2016-06-20 LAB — HEMOGLOBIN A1C: Hgb A1c MFr Bld: 6.6 % — ABNORMAL HIGH (ref 4.6–6.5)

## 2016-06-20 NOTE — Patient Instructions (Signed)
Best of luck on your big adventure!  Let me know if you need anything prior to departure You got your flu shot and "prevnar 13" pneumonia shot today I would recommend a "pneumovax" pneumonia booster in one year and a shingles vaccine at your convenience. Also, please have fasting cholesterol checked in the next few months .

## 2016-06-20 NOTE — Progress Notes (Signed)
Pre visit review using our clinic review tool, if applicable. No additional management support is needed unless otherwise documented below in the visit note. 

## 2016-06-20 NOTE — Progress Notes (Signed)
[place] at [facility] [address] [place], [place] [address] [phone]  Date:  06/20/2016   Name:  Timothy Chapman [facility]   DOB:  04-15-1946   MRN:  [id number]  PCP:  Lamar Blinks, MD    Chief Complaint: Medication concern (Pt would like to discuss Victoza. Will need vaccines today. )   History of Present Illness:  Timothy Chapman is a 70 y.o. very pleasant male patient who presents with the following:  Seen here in May of this year with the following HPI:  Here today to establish care.  History of HTN, DM, GERD, OSA. He has decided to see [place] as his PCP. His DM is treated with glipizide, victoza, metformin He states that his biggest issue is chronic insomnia- he uses melatonin for this.  He has used xanax for this in the past- he has tried trazodone but this did not seem to work.  He prefers the xanax and would like to have some but the [place] will not rx this for him   He is also a pt at the [place].   He will visit the [place] soon and needs malaria prophylaxis. He is engaged to a [place] woman and may be moving there if she does not want to come back to the [place] He will be there 3-6 months if not permanently.   He does have spinal stenosis, has had back surgery a couple of times and some sort of nerve deadening procedure "ablation" not too long ago.  This ablation did seem to help him but can only be repeated every 6 months.   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    Has not had an rx in some time but would like to have on hand for his trip Nothing on [place] for him  He has a urologist Dr. Amalia Hailey at [facility] - will do his PSA today  He was taking xanax 0.5 and he took half up to 3x a week for sleep as needed He is not on cholesterol med- has been  unable to tolerate statins due to leg cramps.    He will be traveling to the [place] soon and is concerned aobut refrigeration for his victoza.  He is leaving at the end of this month. He still does have his malaria meds  Last tetanus less than 10 years ago He has not had any vaccine from the [place] in at least a couple of years. He does not think he has had a pneumonia vaccine. He would like a flu shot as well He has completed hep A and B already, and japanese encephalitis first dose. He plans to repeat this vaccine when he gets to the [place] for cost savings He thinks he had a typhoid vaccine at some point at the [facility]  Since our last visit he had a prostate operation - for BPH- which is helping with his symptoms  He will contact me regarding his refills if he needs anything prior to his trip  He is on victoza, metformin and glipizide.  He is not sure if he will be able to continue victoza once he moves due to logistics/ refrigeration concerns.    He did have a colonoscopy in 2013  We added niacin for his dyslipidemia at last visit but he is NOT fasting today. Main issue was triglycerides so will not check as he is not fasting    Lab Results  Component Value Date   HGBA1C 7.2 (H) 02/08/2016     Patient Active Problem List   Diagnosis Date Noted  . Chronic back pain 02/08/2016  . Controlled type 2 diabetes mellitus with diabetic neuropathy, without long-term current use of insulin ([place]) 02/08/2016  . Insomnia 02/08/2016  . History of diverticulitis 02/08/2016  . HYPERTENSION, BENIGN ESSENTIAL, UNCONTROLLED 03/12/2009  . ABNORMAL ELECTROCARDIOGRAM 03/12/2009    Past Medical History:  Diagnosis Date  . Anxiety   . Arthritis   . Depression   . Diabetes mellitus without complication ([place])   . GERD (gastroesophageal reflux disease)   . Hypertension   . Neuromuscular disorder ([place])   . Sleep apnea     Past Surgical History:  Procedure Laterality Date  . BACK SURGERY      X2  . REPLACEMENT TOTAL KNEE Left   . ROTATOR CUFF REPAIR      Social History  Substance Use Topics  . Smoking status: Never Smoker  . Smokeless tobacco: Never Used  . Alcohol use 0.0 oz/week     Comment: Rarely    Family History  Problem Relation Age of Onset  . Heart failure Father   . Heart disease Father   . Emphysema Mother     Allergies  Allergen Reactions  . Codeine     Medication list has been reviewed and updated.  Current Outpatient Prescriptions on File Prior to Visit  Medication Sig Dispense Refill  . ALPRAZolam (XANAX) 0.5 MG tablet Take 1/2 or 1 at bedtime if needed for anxiety or sleep 30 tablet 2  . benazepril (LOTENSIN) 10 MG tablet Take 10 mg by mouth daily.    . diclofenac (VOLTAREN) 75 MG EC tablet Take 1 tablet (75 mg total) by mouth 2 (two) times daily. Use as needed for joint pains 180 tablet 3  . doxycycline (VIBRA-TABS) 100 MG tablet Take 1 tablet (100 mg total) by mouth daily. Start 1-2 days prior to trip and continue for 4 weeks once home. For malaria prophylaxis 90 tablet 0  . gabapentin (NEURONTIN) 300 MG capsule Take 1 capsule (300 mg total) by mouth 3 (three) times daily. 270 capsule 2  . glipiZIDE (GLUCOTROL) 10 MG tablet Take 1 tablet (10 mg total) by mouth 2 (two) times daily before a meal. 180 tablet 2  . hydrochlorothiazide (HYDRODIURIL) 25 MG tablet Take 1 tablet (25 mg total) by mouth daily. 90 tablet 3  . HYDROcodone-acetaminophen (NORCO) 7.5-325 MG tablet Take 1 tablet by mouth every 8 (eight) hours as needed for moderate pain. 40 tablet 0  . Liraglutide (VICTOZA) 18 MG/3ML SOPN Inject into the skin. Patient receives from [place]    . Melatonin 3 MG TABS Take by mouth.    . metFORMIN (GLUCOPHAGE) 1000 MG tablet Take 1 tablet (1,000 mg total) by mouth 2 (two) times daily with a meal. 180 tablet 3  . Multiple Vitamin (MULTIVITAMIN) tablet Take 1 tablet by mouth daily.    . niacin (NIASPAN) 500 MG CR tablet Take 1 tablet (500 mg total) by  mouth at bedtime. 30 tablet 6  . omeprazole (PRILOSEC) 40 MG capsule Take 40 mg by mouth daily.    Marland Kitchen omeprazole-sodium bicarbonate (ZEGERID) 40-1100 MG capsule Take 1 capsule by mouth daily before breakfast.    . tadalafil (CIALIS)  5 MG tablet Take 5 mg by mouth daily as needed for erectile dysfunction.    . Turmeric 450 MG CAPS Take by mouth.     No current facility-administered medications on file prior to visit.     Review of Systems:  As per HPI- otherwise negative. No fever, chills, cough, CP or SOB   Physical Examination: Vitals:   06/20/16 1107  BP: 129/72  Pulse: 66  Temp: 98.6 F (37 C)   Vitals:   06/20/16 1107  Weight: 243 lb 6.4 oz (110.4 kg)  Height: 5' 9.75" (1.772 m)   Body mass index is 35.18 kg/m. Ideal Body Weight: Weight in (lb) to have BMI = 25: 172.6  GEN: WDWN, NAD, Non-toxic, A & O x 3 HEENT: Atraumatic, Normocephalic. Neck supple. No masses, No LAD. Ears and Nose: No external deformity. CV: RRR, No M/G/R. No JVD. No thrill. No extra heart sounds. PULM: CTA B, no wheezes, crackles, rhonchi. No retractions. No resp. distress. No accessory muscle use. ABD: S, NT, ND, +BS. No rebound. No HSM. EXTR: No c/c/e NEURO Normal gait.  PSYCH: Normally interactive. Conversant. Not depressed or anxious appearing.  Calm demeanor.    Assessment and Plan: Controlled type 2 diabetes mellitus with diabetic neuropathy, without long-term current use of insulin ([place]) - Plan: Hemoglobin A1C  Immunization due - Plan: Pneumococcal conjugate vaccine 13-valent IM  Benign prostatic hyperplasia with urinary frequency  Essential hypertension  Dyslipidemia  Travel advice encounter  History of anemia - Plan: CBC  Here today to follow-up and update vacs prior to a long term visit/ move to the [place].  prevnar and flu today He is otherwise generally UTD He has malaria prophylaxis already Check A1c Did not recheck lipids today BP controlled   Signed Lamar Blinks, MD

## 2016-06-24 ENCOUNTER — Encounter: Payer: Self-pay | Admitting: Family Medicine

## 2016-06-28 ENCOUNTER — Encounter: Payer: Self-pay | Admitting: Family Medicine

## 2016-06-28 ENCOUNTER — Other Ambulatory Visit: Payer: Self-pay

## 2016-06-28 MED ORDER — GLUCOSE BLOOD VI STRP: ORAL_STRIP | 12 refills | Status: DC

## 2016-08-28 ENCOUNTER — Telehealth: Payer: Self-pay | Admitting: Family Medicine

## 2016-08-28 NOTE — Telephone Encounter (Signed)
lvm advising patient to schedule medicare wellness appointment.  °

## 2017-03-07 ENCOUNTER — Telehealth: Payer: Self-pay

## 2017-03-07 NOTE — Telephone Encounter (Signed)
Patient is on the list for [employer] 2018 and may be a good candidate for an AWV. Please let me know if/when appt is scheduled.   

## 2017-03-08 NOTE — Telephone Encounter (Signed)
Called pt to schedule awv. Left vm for patient to call office to schedule appt.

## 2017-06-20 ENCOUNTER — Encounter: Payer: Self-pay | Admitting: Family Medicine

## 2017-06-24 ENCOUNTER — Encounter: Payer: Self-pay | Admitting: Family Medicine

## 2017-07-02 DIAGNOSIS — K219 Gastro-esophageal reflux disease without esophagitis: Secondary | ICD-10-CM | POA: Diagnosis not present

## 2017-07-02 DIAGNOSIS — Z0000 Encounter for general adult medical examination without abnormal findings: Secondary | ICD-10-CM | POA: Diagnosis not present

## 2017-07-02 DIAGNOSIS — I10 Essential (primary) hypertension: Secondary | ICD-10-CM | POA: Diagnosis not present

## 2017-07-02 DIAGNOSIS — G4700 Insomnia, unspecified: Secondary | ICD-10-CM | POA: Diagnosis not present

## 2017-07-02 DIAGNOSIS — N400 Enlarged prostate without lower urinary tract symptoms: Secondary | ICD-10-CM | POA: Diagnosis not present

## 2017-07-02 DIAGNOSIS — Z6834 Body mass index (BMI) 34.0-34.9, adult: Secondary | ICD-10-CM | POA: Diagnosis not present

## 2017-07-02 DIAGNOSIS — E878 Other disorders of electrolyte and fluid balance, not elsewhere classified: Secondary | ICD-10-CM | POA: Diagnosis not present

## 2017-07-02 DIAGNOSIS — N529 Male erectile dysfunction, unspecified: Secondary | ICD-10-CM | POA: Diagnosis not present

## 2017-07-02 DIAGNOSIS — E119 Type 2 diabetes mellitus without complications: Secondary | ICD-10-CM | POA: Diagnosis not present

## 2017-07-02 DIAGNOSIS — E669 Obesity, unspecified: Secondary | ICD-10-CM | POA: Diagnosis not present

## 2017-07-07 NOTE — Progress Notes (Addendum)
[place] at [facility] [address] [place], [place] [address] [phone]  Date:  07/08/2017   Name:  Timothy Chapman   DOB:  1946-07-15   MRN:  [id number]  PCP:  Timothy Mclean, MD    Chief Complaint: Medicare Wellness (Would like refills on all medications)   History of Present Illness:  Timothy Chapman is a 71 y.o. very pleasant male patient who presents with the following:  History of DM, HTN, chronic back pain Here today for a CPE I last saw him about a year ago- he also gets care at tha [place] but has not been there since we met last   He is here with his new wife Timothy Chapman who is from the [place]. They had been there together for the last year but now have returned to live [place] the states. They plan to live her for the foreseeable future   He has not had any care in a few months, needs refills and labs  He is not taking norco except for very rare occasion for back pain  Lab Results  Component Value Date   HGBA1C 6.6 (H) 06/20/2016   He is on metformin, glipizide and victoza for his DM- he is afraid that his A1c will be high   He has increased his victoza from 1.2 to 1.8 mg lately on his own   He is using benazepril and hctz 12.5 daily. Not able to tolerate a higher dose of hctz due to leg cramps  He is not taking niaspan now  Psa- due. History of hypogonadism, ED and BPH His urologist has moved- he needs a referral to a new doc in the area He does take cialis 5 mg daily  Flu shot due today He did have an eye check and got new glasses while he was in the [place]   He has noted his BP running high at home for the last few months - he would be ok with increasing his benazepril to 20 mg   No CP or SOB with exercise    BP Readings from Last 3 Encounters:  07/08/17 (!) 159/95  06/20/16 129/72  02/08/16 130/84     Patient Active Problem List   Diagnosis Date Noted  . Chronic back pain 02/08/2016  .  Controlled type 2 diabetes mellitus with diabetic neuropathy, without long-term current use of insulin ([place]) 02/08/2016  . Insomnia 02/08/2016  . History of diverticulitis 02/08/2016  . HYPERTENSION, BENIGN ESSENTIAL, UNCONTROLLED 03/12/2009  . ABNORMAL ELECTROCARDIOGRAM 03/12/2009    Past Medical History:  Diagnosis Date  . Anxiety   . Arthritis   . Depression   . Diabetes mellitus without complication ([place])   . GERD (gastroesophageal reflux disease)   . Hypertension   . Neuromuscular disorder ([place])   . Sleep apnea     Past Surgical History:  Procedure Laterality Date  . BACK SURGERY     X2  . REPLACEMENT TOTAL KNEE Left   . ROTATOR CUFF REPAIR      Social History  Substance Use Topics  . Smoking status: Former Smoker    Quit date: 09/18/1975  . Smokeless tobacco: Never Used  . Alcohol use 0.0 oz/week     Comment: Rarely    Family History  Problem Relation Age of Onset  . Heart failure Father   . Heart disease Father   . Emphysema Mother     Allergies  Allergen Reactions  .  Codeine     Medication list has been reviewed and updated.  Current Outpatient Prescriptions on File Prior to Visit  Medication Sig Dispense Refill  . Acetylcarnitine HCl 500 MG CAPS Take 500 mg by mouth daily.    . Alpha-Lipoic Acid 600 MG CAPS Take 600 mg by mouth daily.    . diclofenac (VOLTAREN) 75 MG EC tablet Take 1 tablet (75 mg total) by mouth 2 (two) times daily. Use as needed for joint pains 180 tablet 3  . glucose blood test strip Use as instructed 100 each 12  . Melatonin 3 MG TABS Take by mouth.    . Multiple Vitamin (MULTIVITAMIN) tablet Take 1 tablet by mouth daily.    . niacin (NIASPAN) 500 MG CR tablet Take 1 tablet (500 mg total) by mouth at bedtime. 30 tablet 6  . Turmeric 450 MG CAPS Take by mouth.     No current facility-administered medications on file prior to visit.     Review of Systems:  As per HPI- otherwise negative. No fever or chills No nausea,  vomiting or diarrhea    Physical Examination: Vitals:   07/08/17 1505 07/08/17 1509  BP: (!) 168/89 (!) 159/95  Pulse: 86   Temp: 97.8 F (36.6 C)   SpO2: 97%    Vitals:   07/08/17 1505  Weight: 245 lb 3.2 oz (111.2 kg)  Height: _0  (1.753 m)   Body mass index is 36.21 kg/m. Ideal Body Weight: Weight in (lb) to have BMI = 25: 168.9  GEN: WDWN, NAD, Non-toxic, A & O x 3, obese, otherwise looks well HEENT: Atraumatic, Normocephalic. Neck supple. No masses, No LAD.  Bilateral TM wnl, oropharynx normal.  PEERL,EOMI.   Ears and Nose: No external deformity. CV: RRR, No M/G/R. No JVD. No thrill. No extra heart sounds. PULM: CTA B, no wheezes, crackles, rhonchi. No retractions. No resp. distress. No accessory muscle use. ABD: S, NT, ND EXTR: No c/c/e NEURO Normal gait.  PSYCH: Normally interactive. Conversant. Not depressed or anxious appearing.  Calm demeanor.    Assessment and Plan: Controlled type 2 diabetes mellitus with diabetic neuropathy, without long-term current use of insulin ([facility]) - Plan: glipiZIDE (GLUCOTROL) 10 MG tablet, liraglutide (VICTOZA) 18 MG/3ML SOPN, metFORMIN (GLUCOPHAGE) 1000 MG tablet, Comprehensive metabolic panel, Hemoglobin A1c  Essential hypertension - Plan: benazepril (LOTENSIN) 20 MG tablet, hydrochlorothiazide (HYDRODIURIL) 25 MG tablet  Gastroesophageal reflux disease, esophagitis presence not specified - Plan: omeprazole (PRILOSEC) 40 MG capsule  Benign prostatic hyperplasia with lower urinary tract symptoms, symptom details unspecified - Plan: Ambulatory referral to Urology, PSA, tadalafil (CIALIS) 5 MG tablet  Hypogonadism in male - Plan: Testosterone Total,Free,Bio, Males  Dyslipidemia - Plan: Lipid panel  Medication monitoring encounter - Plan: CBC, Comprehensive metabolic panel  Erectile dysfunction, unspecified erectile dysfunction type - Plan: tadalafil (CIALIS) 5 MG tablet  Here today for a recheck visit- last seen here about a year  ago He plans to come in for fasting labs in a few days  Refilled medications as above Increased dose of benazepril to 20 mg  Will plan further follow- up pending labs.   Signed Timothy Blinks, MD  Received labs 10/23 Results for orders placed or performed in visit on 07/09/17 -CBC      Result                      Value             Ref Range  WBC                         7.5               4.0 - 10.5 K*      RBC                         4.34              4.22 - 5.81 *      Platelets                   267.0             150.0 - 400.*      Hemoglobin                  12.3 (L)          13.0 - 17.0 *      HCT                         37.7 (L)          39.0 - 52.0 %      MCV                         86.8              78.0 - 100.0*      MCHC                        32.7              30.0 - 36.0 *      RDW                         14.9              11.5 - 15.5 % -Comprehensive metabolic panel      Result                      Value             Ref Range          Sodium                      139               135 - 145 mE*      Potassium                   4.9               3.5 - 5.1 mE*      Chloride                    102               96 - 112 mEq*      CO2                         29                19 - 32 mEq/L      Glucose, Bld  169 (H)           70 - 99 mg/dL      BUN                         27 (H)            6 - 23 mg/dL       Creatinine, Ser             1.11              0.40 - 1.50 *      Total Bilirubin             0.4               0.2 - 1.2 mg*      Alkaline Phosphatase        32 (L)            39 - 117 U/L       AST                         27                0 - 37 U/L         ALT                         43                0 - 53 U/L         Total Protein               6.9               6.0 - 8.3 g/*      Albumin                     4.2               3.5 - 5.2 g/*      Calcium                     9.9               8.4 - 10.5 m*      GFR                          69.82             >60.00 mL/min -Hemoglobin A1c      Result                      Value             Ref Range          Hgb A1c MFr Bld             8.9 (H)           4.6 - 6.5 %   -Lipid panel      Result                      Value             Ref Range          Cholesterol  141               0 - 200 mg/dL      Triglycerides               285.0 (H)         0.0 - 149.0 *      HDL                         25.70 (L)         >39.00 mg/dL       VLDL                        57.0 (H)          0.0 - 40.0 m*      Total CHOL/HDL Ratio        5                                    NonHDL                      115.41                          -PSA      Result                      Value             Ref Range          PSA                         6.57 (H)          0.10 - 4.00 * -LDL cholesterol, direct      Result                      Value             Ref Range          Direct LDL                  77.0              mg/dL

## 2017-07-08 ENCOUNTER — Ambulatory Visit (INDEPENDENT_AMBULATORY_CARE_PROVIDER_SITE_OTHER): Payer: Medicare HMO | Admitting: Family Medicine

## 2017-07-08 ENCOUNTER — Encounter: Payer: Self-pay | Admitting: Family Medicine

## 2017-07-08 VITALS — BP 159/95 | HR 86 | Temp 97.8°F | Ht 69.0 in | Wt 245.2 lb

## 2017-07-08 DIAGNOSIS — I10 Essential (primary) hypertension: Secondary | ICD-10-CM | POA: Diagnosis not present

## 2017-07-08 DIAGNOSIS — Z5181 Encounter for therapeutic drug level monitoring: Secondary | ICD-10-CM

## 2017-07-08 DIAGNOSIS — N401 Enlarged prostate with lower urinary tract symptoms: Secondary | ICD-10-CM

## 2017-07-08 DIAGNOSIS — E291 Testicular hypofunction: Secondary | ICD-10-CM

## 2017-07-08 DIAGNOSIS — K219 Gastro-esophageal reflux disease without esophagitis: Secondary | ICD-10-CM

## 2017-07-08 DIAGNOSIS — N529 Male erectile dysfunction, unspecified: Secondary | ICD-10-CM | POA: Diagnosis not present

## 2017-07-08 DIAGNOSIS — Z23 Encounter for immunization: Secondary | ICD-10-CM | POA: Diagnosis not present

## 2017-07-08 DIAGNOSIS — E1140 Type 2 diabetes mellitus with diabetic neuropathy, unspecified: Secondary | ICD-10-CM | POA: Diagnosis not present

## 2017-07-08 DIAGNOSIS — E785 Hyperlipidemia, unspecified: Secondary | ICD-10-CM | POA: Diagnosis not present

## 2017-07-08 MED ORDER — TADALAFIL 5 MG PO TABS: 5.0000 mg | ORAL_TABLET | Freq: Every day | ORAL | 1 refills | Status: DC | PRN

## 2017-07-08 MED ORDER — OMEPRAZOLE 40 MG PO CPDR: 40.0000 mg | DELAYED_RELEASE_CAPSULE | Freq: Every day | ORAL | 3 refills | Status: DC

## 2017-07-08 MED ORDER — LIRAGLUTIDE 18 MG/3ML ~~LOC~~ SOPN: PEN_INJECTOR | SUBCUTANEOUS | 3 refills | Status: DC

## 2017-07-08 MED ORDER — GLIPIZIDE 10 MG PO TABS: 10.0000 mg | ORAL_TABLET | Freq: Two times a day (BID) | ORAL | 3 refills | Status: DC

## 2017-07-08 MED ORDER — BENAZEPRIL HCL 20 MG PO TABS: 20.0000 mg | ORAL_TABLET | Freq: Every day | ORAL | 3 refills | Status: DC

## 2017-07-08 MED ORDER — TADALAFIL 5 MG PO TABS: 5.0000 mg | ORAL_TABLET | Freq: Every day | ORAL | 3 refills | Status: DC | PRN

## 2017-07-08 MED ORDER — HYDROCHLOROTHIAZIDE 25 MG PO TABS: 12.5000 mg | ORAL_TABLET | Freq: Every day | ORAL | 3 refills | Status: DC

## 2017-07-08 MED ORDER — METFORMIN HCL 1000 MG PO TABS: 1000.0000 mg | ORAL_TABLET | Freq: Two times a day (BID) | ORAL | 3 refills | Status: DC

## 2017-07-08 NOTE — Patient Instructions (Addendum)
It was nice to see you and meet your wife today!  Please come in for fasting labs at your convenience- please have your blood drawn prior to 9am for the most accurate testosterone level I am going to refer you to urology in [facility] for follow-up We will increase your benazepril to 20 mg once a day for your BP I will be in touch with your labs asap and we can plan out next visit Take care!

## 2017-07-09 ENCOUNTER — Encounter: Payer: Self-pay | Admitting: Family Medicine

## 2017-07-09 ENCOUNTER — Other Ambulatory Visit (INDEPENDENT_AMBULATORY_CARE_PROVIDER_SITE_OTHER): Payer: Medicare HMO

## 2017-07-09 DIAGNOSIS — N401 Enlarged prostate with lower urinary tract symptoms: Secondary | ICD-10-CM

## 2017-07-09 DIAGNOSIS — E1140 Type 2 diabetes mellitus with diabetic neuropathy, unspecified: Secondary | ICD-10-CM | POA: Diagnosis not present

## 2017-07-09 DIAGNOSIS — Z5181 Encounter for therapeutic drug level monitoring: Secondary | ICD-10-CM | POA: Diagnosis not present

## 2017-07-09 DIAGNOSIS — E785 Hyperlipidemia, unspecified: Secondary | ICD-10-CM | POA: Diagnosis not present

## 2017-07-09 DIAGNOSIS — E291 Testicular hypofunction: Secondary | ICD-10-CM

## 2017-07-09 LAB — CBC
HCT: 37.7 % — ABNORMAL LOW (ref 39.0–52.0)
Hemoglobin: 12.3 g/dL — ABNORMAL LOW (ref 13.0–17.0)
MCHC: 32.7 g/dL (ref 30.0–36.0)
MCV: 86.8 fl (ref 78.0–100.0)
PLATELETS: 267 10*3/uL (ref 150.0–400.0)
RBC: 4.34 Mil/uL (ref 4.22–5.81)
RDW: 14.9 % (ref 11.5–15.5)
WBC: 7.5 10*3/uL (ref 4.0–10.5)

## 2017-07-09 LAB — LIPID PANEL
CHOLESTEROL: 141 mg/dL (ref 0–200)
HDL: 25.7 mg/dL — ABNORMAL LOW (ref >39.00)
NonHDL: 115.41
TRIGLYCERIDES: 285 mg/dL — AB (ref 0.0–149.0)
Total CHOL/HDL Ratio: 5
VLDL: 57 mg/dL — ABNORMAL HIGH (ref 0.0–40.0)

## 2017-07-09 LAB — LDL CHOLESTEROL, DIRECT: LDL DIRECT: 77 mg/dL

## 2017-07-09 LAB — COMPREHENSIVE METABOLIC PANEL
ALBUMIN: 4.2 g/dL (ref 3.5–5.2)
ALK PHOS: 32 U/L — AB (ref 39–117)
ALT: 43 U/L (ref 0–53)
AST: 27 U/L (ref 0–37)
BILIRUBIN TOTAL: 0.4 mg/dL (ref 0.2–1.2)
BUN: 27 mg/dL — ABNORMAL HIGH (ref 6–23)
CALCIUM: 9.9 mg/dL (ref 8.4–10.5)
CO2: 29 mEq/L (ref 19–32)
Chloride: 102 mEq/L (ref 96–112)
Creatinine, Ser: 1.11 mg/dL (ref 0.40–1.50)
GFR: 69.82 mL/min (ref >60.00)
Glucose, Bld: 169 mg/dL — ABNORMAL HIGH (ref 70–99)
Potassium: 4.9 mEq/L (ref 3.5–5.1)
SODIUM: 139 meq/L (ref 135–145)
TOTAL PROTEIN: 6.9 g/dL (ref 6.0–8.3)

## 2017-07-09 LAB — PSA: PSA: 6.57 ng/mL — ABNORMAL HIGH (ref 0.10–4.00)

## 2017-07-09 LAB — HEMOGLOBIN A1C: Hgb A1c MFr Bld: 8.9 % — ABNORMAL HIGH (ref 4.6–6.5)

## 2017-07-09 NOTE — Progress Notes (Signed)
Mailed

## 2017-07-10 ENCOUNTER — Encounter: Payer: Self-pay | Admitting: Family Medicine

## 2017-07-10 LAB — TESTOSTERONE TOTAL,FREE,BIO, MALES
Albumin: 4 g/dL (ref 3.6–5.1)
Sex Hormone Binding: 7 nmol/L — ABNORMAL LOW (ref 22–77)
TESTOSTERONE: 142 ng/dL — AB (ref 250–827)

## 2017-07-12 ENCOUNTER — Telehealth: Payer: Self-pay

## 2017-07-12 ENCOUNTER — Encounter: Payer: Self-pay | Admitting: Family Medicine

## 2017-07-12 DIAGNOSIS — N529 Male erectile dysfunction, unspecified: Secondary | ICD-10-CM

## 2017-07-12 DIAGNOSIS — N401 Enlarged prostate with lower urinary tract symptoms: Secondary | ICD-10-CM

## 2017-07-12 NOTE — Telephone Encounter (Signed)
PA initiated via Covermymeds; KEY: [id number]. Awaiting determination.

## 2017-07-15 NOTE — Telephone Encounter (Signed)
PA denied. Medicare Part D rules do not allow coverage of drugs being used for erectile and sexual dysfunction or decreased libido (sex drive).

## 2017-07-15 NOTE — Telephone Encounter (Signed)
Pt actually using Cialis for BPH symptoms. PA re-initiated; KEY: [id number]. Awaiting determination.

## 2017-07-16 NOTE — Telephone Encounter (Signed)
PA appeal approved through 09/16/2017. Approval number: [id number].

## 2017-07-18 ENCOUNTER — Encounter: Payer: Self-pay | Admitting: Family Medicine

## 2017-07-19 ENCOUNTER — Other Ambulatory Visit: Payer: Medicare HMO

## 2017-07-19 ENCOUNTER — Encounter: Payer: Self-pay | Admitting: Family Medicine

## 2017-07-19 MED ORDER — DICLOFENAC SODIUM 75 MG PO TBEC: 75.0000 mg | DELAYED_RELEASE_TABLET | Freq: Two times a day (BID) | ORAL | 3 refills | Status: DC

## 2017-07-19 NOTE — Addendum Note (Signed)
Addended by: Lamar Blinks C on: 07/19/2017 12:52 PM   Modules accepted: Orders

## 2017-07-22 ENCOUNTER — Other Ambulatory Visit: Payer: Self-pay

## 2017-07-22 ENCOUNTER — Telehealth: Payer: Self-pay | Admitting: Family Medicine

## 2017-07-22 ENCOUNTER — Other Ambulatory Visit (INDEPENDENT_AMBULATORY_CARE_PROVIDER_SITE_OTHER): Payer: Medicare HMO

## 2017-07-22 DIAGNOSIS — K625 Hemorrhage of anus and rectum: Secondary | ICD-10-CM | POA: Diagnosis not present

## 2017-07-22 LAB — FECAL OCCULT BLOOD, IMMUNOCHEMICAL: FECAL OCCULT BLD: NEGATIVE

## 2017-07-22 NOTE — Telephone Encounter (Signed)
Copied from [place] [id number]. Topic: General - Other >> Jul 22, 2017 12:06 PM Oneta Rack wrote: [facility] in [place] requesting IFOB LAB TEST order, patient dropped off specimen today, please advise

## 2017-07-22 NOTE — Telephone Encounter (Signed)
Cassandra from [facility] called to have order put in for IFOB. Orders put in while lab was on the phone. Confirmed correct orders and association was put in. Completed

## 2017-07-24 MED ORDER — TADALAFIL 5 MG PO TABS: 5.0000 mg | ORAL_TABLET | Freq: Every day | ORAL | 3 refills | Status: DC | PRN

## 2017-07-24 NOTE — Addendum Note (Signed)
Addended by: Lamar Blinks C on: 07/24/2017 08:40 AM   Modules accepted: Orders

## 2017-08-06 ENCOUNTER — Telehealth: Payer: Self-pay

## 2017-08-06 DIAGNOSIS — D649 Anemia, unspecified: Secondary | ICD-10-CM

## 2017-08-06 NOTE — Telephone Encounter (Signed)
Copied from [place] [id number]. Topic: General - Other >> Jul 22, 2017 12:06 PM Oneta Rack wrote:  Lab in [place] requesting IFOB LAB TEST order, patient dropped off specimen today, please advise

## 2017-08-07 NOTE — Telephone Encounter (Signed)
done

## 2017-08-21 ENCOUNTER — Encounter: Payer: Self-pay | Admitting: Family Medicine

## 2017-08-21 MED ORDER — ALCOHOL PREP PADS: MEDICATED_PAD | 99 refills | Status: DC

## 2017-08-21 MED ORDER — PEN NEEDLES 31G X 5 MM MISC: 1.0000 | Freq: Every day | 99 refills | Status: DC

## 2017-08-22 MED ORDER — PEN NEEDLES 31G X 5 MM MISC: 1.0000 | Freq: Every day | 99 refills | Status: DC

## 2017-08-22 MED ORDER — ALCOHOL PREP PADS: MEDICATED_PAD | 99 refills | Status: DC

## 2017-08-22 NOTE — Addendum Note (Signed)
Addended byDamita Dunnings D on: 08/22/2017 12:50 PM   Modules accepted: Orders

## 2017-10-02 ENCOUNTER — Inpatient Hospital Stay (HOSPITAL_BASED_OUTPATIENT_CLINIC_OR_DEPARTMENT_OTHER)
Admission: EM | Admit: 2017-10-02 | Discharge: 2017-10-11 | DRG: 871 | Disposition: A | Discharge status: Home Health | Payer: Medicare HMO | Attending: Internal Medicine | Admitting: Internal Medicine

## 2017-10-02 ENCOUNTER — Inpatient Hospital Stay (HOSPITAL_COMMUNITY): Payer: Medicare HMO

## 2017-10-02 ENCOUNTER — Encounter (HOSPITAL_BASED_OUTPATIENT_CLINIC_OR_DEPARTMENT_OTHER): Payer: Self-pay | Admitting: Emergency Medicine

## 2017-10-02 ENCOUNTER — Emergency Department (HOSPITAL_BASED_OUTPATIENT_CLINIC_OR_DEPARTMENT_OTHER): Payer: Medicare HMO

## 2017-10-02 ENCOUNTER — Ambulatory Visit: Payer: Self-pay

## 2017-10-02 ENCOUNTER — Other Ambulatory Visit: Payer: Self-pay

## 2017-10-02 DIAGNOSIS — E1165 Type 2 diabetes mellitus with hyperglycemia: Secondary | ICD-10-CM | POA: Diagnosis present

## 2017-10-02 DIAGNOSIS — G4733 Obstructive sleep apnea (adult) (pediatric): Secondary | ICD-10-CM | POA: Diagnosis present

## 2017-10-02 DIAGNOSIS — Z96652 Presence of left artificial knee joint: Secondary | ICD-10-CM | POA: Diagnosis present

## 2017-10-02 DIAGNOSIS — K829 Disease of gallbladder, unspecified: Secondary | ICD-10-CM | POA: Diagnosis not present

## 2017-10-02 DIAGNOSIS — J189 Pneumonia, unspecified organism: Secondary | ICD-10-CM

## 2017-10-02 DIAGNOSIS — G9341 Metabolic encephalopathy: Secondary | ICD-10-CM | POA: Diagnosis present

## 2017-10-02 DIAGNOSIS — Z9911 Dependence on respirator [ventilator] status: Secondary | ICD-10-CM | POA: Diagnosis not present

## 2017-10-02 DIAGNOSIS — D649 Anemia, unspecified: Secondary | ICD-10-CM | POA: Diagnosis present

## 2017-10-02 DIAGNOSIS — R6521 Severe sepsis with septic shock: Secondary | ICD-10-CM | POA: Diagnosis present

## 2017-10-02 DIAGNOSIS — Z5329 Procedure and treatment not carried out because of patient's decision for other reasons: Secondary | ICD-10-CM | POA: Diagnosis not present

## 2017-10-02 DIAGNOSIS — M1990 Unspecified osteoarthritis, unspecified site: Secondary | ICD-10-CM | POA: Diagnosis present

## 2017-10-02 DIAGNOSIS — R933 Abnormal findings on diagnostic imaging of other parts of digestive tract: Secondary | ICD-10-CM | POA: Diagnosis not present

## 2017-10-02 DIAGNOSIS — F40240 Claustrophobia: Secondary | ICD-10-CM | POA: Diagnosis present

## 2017-10-02 DIAGNOSIS — K8063 Calculus of gallbladder and bile duct with acute cholecystitis with obstruction: Secondary | ICD-10-CM | POA: Diagnosis not present

## 2017-10-02 DIAGNOSIS — I10 Essential (primary) hypertension: Secondary | ICD-10-CM | POA: Diagnosis present

## 2017-10-02 DIAGNOSIS — R451 Restlessness and agitation: Secondary | ICD-10-CM | POA: Diagnosis not present

## 2017-10-02 DIAGNOSIS — Z79899 Other long term (current) drug therapy: Secondary | ICD-10-CM

## 2017-10-02 DIAGNOSIS — R402414 Glasgow coma scale score 13-15, 24 hours or more after hospital admission: Secondary | ICD-10-CM | POA: Diagnosis not present

## 2017-10-02 DIAGNOSIS — N189 Chronic kidney disease, unspecified: Secondary | ICD-10-CM | POA: Diagnosis not present

## 2017-10-02 DIAGNOSIS — E876 Hypokalemia: Secondary | ICD-10-CM | POA: Diagnosis present

## 2017-10-02 DIAGNOSIS — G709 Myoneural disorder, unspecified: Secondary | ICD-10-CM | POA: Diagnosis present

## 2017-10-02 DIAGNOSIS — Z6835 Body mass index (BMI) 35.0-35.9, adult: Secondary | ICD-10-CM

## 2017-10-02 DIAGNOSIS — Z0189 Encounter for other specified special examinations: Secondary | ICD-10-CM

## 2017-10-02 DIAGNOSIS — N179 Acute kidney failure, unspecified: Secondary | ICD-10-CM | POA: Diagnosis not present

## 2017-10-02 DIAGNOSIS — R1011 Right upper quadrant pain: Secondary | ICD-10-CM | POA: Diagnosis not present

## 2017-10-02 DIAGNOSIS — R197 Diarrhea, unspecified: Secondary | ICD-10-CM | POA: Diagnosis not present

## 2017-10-02 DIAGNOSIS — Z4659 Encounter for fitting and adjustment of other gastrointestinal appliance and device: Secondary | ICD-10-CM

## 2017-10-02 DIAGNOSIS — R945 Abnormal results of liver function studies: Secondary | ICD-10-CM | POA: Diagnosis not present

## 2017-10-02 DIAGNOSIS — I4891 Unspecified atrial fibrillation: Secondary | ICD-10-CM | POA: Diagnosis not present

## 2017-10-02 DIAGNOSIS — K810 Acute cholecystitis: Secondary | ICD-10-CM

## 2017-10-02 DIAGNOSIS — K5710 Diverticulosis of small intestine without perforation or abscess without bleeding: Secondary | ICD-10-CM | POA: Diagnosis not present

## 2017-10-02 DIAGNOSIS — K760 Fatty (change of) liver, not elsewhere classified: Secondary | ICD-10-CM | POA: Diagnosis not present

## 2017-10-02 DIAGNOSIS — E669 Obesity, unspecified: Secondary | ICD-10-CM | POA: Diagnosis present

## 2017-10-02 DIAGNOSIS — J90 Pleural effusion, not elsewhere classified: Secondary | ICD-10-CM | POA: Diagnosis not present

## 2017-10-02 DIAGNOSIS — Z434 Encounter for attention to other artificial openings of digestive tract: Secondary | ICD-10-CM

## 2017-10-02 DIAGNOSIS — E860 Dehydration: Secondary | ICD-10-CM | POA: Diagnosis present

## 2017-10-02 DIAGNOSIS — R1110 Vomiting, unspecified: Secondary | ICD-10-CM

## 2017-10-02 DIAGNOSIS — R188 Other ascites: Secondary | ICD-10-CM | POA: Diagnosis not present

## 2017-10-02 DIAGNOSIS — K8000 Calculus of gallbladder with acute cholecystitis without obstruction: Secondary | ICD-10-CM

## 2017-10-02 DIAGNOSIS — K56609 Unspecified intestinal obstruction, unspecified as to partial versus complete obstruction: Secondary | ICD-10-CM | POA: Diagnosis not present

## 2017-10-02 DIAGNOSIS — N400 Enlarged prostate without lower urinary tract symptoms: Secondary | ICD-10-CM | POA: Diagnosis present

## 2017-10-02 DIAGNOSIS — R9389 Abnormal findings on diagnostic imaging of other specified body structures: Secondary | ICD-10-CM | POA: Diagnosis not present

## 2017-10-02 DIAGNOSIS — N281 Cyst of kidney, acquired: Secondary | ICD-10-CM | POA: Diagnosis present

## 2017-10-02 DIAGNOSIS — A419 Sepsis, unspecified organism: Secondary | ICD-10-CM

## 2017-10-02 DIAGNOSIS — K8020 Calculus of gallbladder without cholecystitis without obstruction: Secondary | ICD-10-CM | POA: Diagnosis not present

## 2017-10-02 DIAGNOSIS — J9690 Respiratory failure, unspecified, unspecified whether with hypoxia or hypercapnia: Secondary | ICD-10-CM | POA: Diagnosis not present

## 2017-10-02 DIAGNOSIS — R112 Nausea with vomiting, unspecified: Secondary | ICD-10-CM | POA: Diagnosis not present

## 2017-10-02 DIAGNOSIS — K819 Cholecystitis, unspecified: Secondary | ICD-10-CM | POA: Diagnosis not present

## 2017-10-02 DIAGNOSIS — Z87891 Personal history of nicotine dependence: Secondary | ICD-10-CM

## 2017-10-02 DIAGNOSIS — R1111 Vomiting without nausea: Secondary | ICD-10-CM | POA: Diagnosis not present

## 2017-10-02 DIAGNOSIS — Z885 Allergy status to narcotic agent status: Secondary | ICD-10-CM

## 2017-10-02 DIAGNOSIS — K219 Gastro-esophageal reflux disease without esophagitis: Secondary | ICD-10-CM | POA: Diagnosis present

## 2017-10-02 DIAGNOSIS — R109 Unspecified abdominal pain: Secondary | ICD-10-CM | POA: Diagnosis not present

## 2017-10-02 DIAGNOSIS — Z7984 Long term (current) use of oral hypoglycemic drugs: Secondary | ICD-10-CM

## 2017-10-02 DIAGNOSIS — N170 Acute kidney failure with tubular necrosis: Secondary | ICD-10-CM | POA: Diagnosis not present

## 2017-10-02 DIAGNOSIS — Z4682 Encounter for fitting and adjustment of non-vascular catheter: Secondary | ICD-10-CM | POA: Diagnosis not present

## 2017-10-02 DIAGNOSIS — Z01818 Encounter for other preprocedural examination: Secondary | ICD-10-CM

## 2017-10-02 DIAGNOSIS — R7989 Other specified abnormal findings of blood chemistry: Secondary | ICD-10-CM | POA: Diagnosis not present

## 2017-10-02 DIAGNOSIS — K567 Ileus, unspecified: Secondary | ICD-10-CM | POA: Diagnosis not present

## 2017-10-02 DIAGNOSIS — R1084 Generalized abdominal pain: Secondary | ICD-10-CM | POA: Diagnosis not present

## 2017-10-02 DIAGNOSIS — J9601 Acute respiratory failure with hypoxia: Secondary | ICD-10-CM | POA: Diagnosis present

## 2017-10-02 DIAGNOSIS — K828 Other specified diseases of gallbladder: Secondary | ICD-10-CM | POA: Diagnosis not present

## 2017-10-02 DIAGNOSIS — Z888 Allergy status to other drugs, medicaments and biological substances status: Secondary | ICD-10-CM

## 2017-10-02 DIAGNOSIS — K8309 Other cholangitis: Secondary | ICD-10-CM | POA: Diagnosis present

## 2017-10-02 DIAGNOSIS — R52 Pain, unspecified: Secondary | ICD-10-CM

## 2017-10-02 DIAGNOSIS — K7689 Other specified diseases of liver: Secondary | ICD-10-CM | POA: Diagnosis not present

## 2017-10-02 DIAGNOSIS — R509 Fever, unspecified: Secondary | ICD-10-CM | POA: Diagnosis not present

## 2017-10-02 DIAGNOSIS — J9600 Acute respiratory failure, unspecified whether with hypoxia or hypercapnia: Secondary | ICD-10-CM

## 2017-10-02 HISTORY — DX: Acute kidney failure, unspecified: N17.9

## 2017-10-02 LAB — COMPREHENSIVE METABOLIC PANEL
ALK PHOS: 73 U/L (ref 38–126)
ALT: 259 U/L — AB (ref 17–63)
ANION GAP: 14 (ref 5–15)
AST: 89 U/L — ABNORMAL HIGH (ref 15–41)
Albumin: 3.7 g/dL (ref 3.5–5.0)
BILIRUBIN TOTAL: 1 mg/dL (ref 0.3–1.2)
BUN: 47 mg/dL — ABNORMAL HIGH (ref 6–20)
CALCIUM: 9.1 mg/dL (ref 8.9–10.3)
CO2: 19 mmol/L — AB (ref 22–32)
Chloride: 100 mmol/L — ABNORMAL LOW (ref 101–111)
Creatinine, Ser: 3.42 mg/dL — ABNORMAL HIGH (ref 0.61–1.24)
GFR, EST AFRICAN AMERICAN: 20 mL/min — AB (ref >60)
GFR, EST NON AFRICAN AMERICAN: 17 mL/min — AB (ref >60)
Glucose, Bld: 222 mg/dL — ABNORMAL HIGH (ref 65–99)
Potassium: 4.3 mmol/L (ref 3.5–5.1)
SODIUM: 133 mmol/L — AB (ref 135–145)
TOTAL PROTEIN: 7.5 g/dL (ref 6.5–8.1)

## 2017-10-02 LAB — CBC WITH DIFFERENTIAL/PLATELET
Basophils Absolute: 0 10*3/uL (ref 0.0–0.1)
Basophils Relative: 0 %
EOS ABS: 0.1 10*3/uL (ref 0.0–0.7)
EOS PCT: 1 %
HCT: 36.3 % — ABNORMAL LOW (ref 39.0–52.0)
Hemoglobin: 12.3 g/dL — ABNORMAL LOW (ref 13.0–17.0)
LYMPHS ABS: 1.2 10*3/uL (ref 0.7–4.0)
Lymphocytes Relative: 12 %
MCH: 28.9 pg (ref 26.0–34.0)
MCHC: 33.9 g/dL (ref 30.0–36.0)
MCV: 85.2 fL (ref 78.0–100.0)
Monocytes Absolute: 0.9 10*3/uL (ref 0.1–1.0)
Monocytes Relative: 9 %
NEUTROS PCT: 78 %
Neutro Abs: 8.1 10*3/uL — ABNORMAL HIGH (ref 1.7–7.7)
PLATELETS: 238 10*3/uL (ref 150–400)
RBC: 4.26 MIL/uL (ref 4.22–5.81)
RDW: 13.8 % (ref 11.5–15.5)
WBC: 10.4 10*3/uL (ref 4.0–10.5)

## 2017-10-02 LAB — LIPASE, BLOOD: Lipase: 30 U/L (ref 11–51)

## 2017-10-02 LAB — INFLUENZA PANEL BY PCR (TYPE A & B)
INFLAPCR: NEGATIVE
Influenza B By PCR: NEGATIVE

## 2017-10-02 LAB — I-STAT CG4 LACTIC ACID, ED
LACTIC ACID, VENOUS: 2.48 mmol/L — AB (ref 0.5–1.9)
LACTIC ACID, VENOUS: 1.33 mmol/L (ref 0.5–1.9)
Lactic Acid, Venous: 1.54 mmol/L (ref 0.5–1.9)

## 2017-10-02 MED ORDER — ONDANSETRON HCL 4 MG PO TABS: 4.0000 mg | ORAL_TABLET | Freq: Four times a day (QID) | ORAL | Status: DC | PRN

## 2017-10-02 MED ORDER — HEPARIN SODIUM (PORCINE) 5000 UNIT/ML IJ SOLN
5000.0000 [IU] | Freq: Three times a day (TID) | INTRAMUSCULAR | Status: DC
  Administered 2017-10-02 – 2017-10-11 (×25): 5000 [IU] via SUBCUTANEOUS
  Filled 2017-10-02 (×25): qty 1

## 2017-10-02 MED ORDER — SENNA 8.6 MG PO TABS
1.0000 | ORAL_TABLET | Freq: Two times a day (BID) | ORAL | Status: DC
  Administered 2017-10-10 – 2017-10-11 (×2): 8.6 mg via ORAL
  Filled 2017-10-02 (×6): qty 1

## 2017-10-02 MED ORDER — VANCOMYCIN HCL IN DEXTROSE 1-5 GM/200ML-% IV SOLN
1000.0000 mg | INTRAVENOUS | Status: DC
  Administered 2017-10-02: 1000 mg via INTRAVENOUS
  Filled 2017-10-02: qty 200

## 2017-10-02 MED ORDER — SODIUM CHLORIDE 0.9% FLUSH: 3.0000 mL | INTRAVENOUS | Status: DC | PRN

## 2017-10-02 MED ORDER — PANTOPRAZOLE SODIUM 40 MG PO TBEC
40.0000 mg | DELAYED_RELEASE_TABLET | Freq: Two times a day (BID) | ORAL | Status: DC
  Administered 2017-10-03: 40 mg via ORAL
  Filled 2017-10-02: qty 1

## 2017-10-02 MED ORDER — SODIUM CHLORIDE 0.9 % IV BOLUS (SEPSIS)
1000.0000 mL | Freq: Once | INTRAVENOUS | Status: AC
  Administered 2017-10-02: 1000 mL via INTRAVENOUS

## 2017-10-02 MED ORDER — ONDANSETRON HCL 4 MG/2ML IJ SOLN
4.0000 mg | Freq: Four times a day (QID) | INTRAMUSCULAR | Status: DC | PRN
Start: 2017-10-02 — End: 2017-10-11
  Administered 2017-10-04 – 2017-10-07 (×5): 4 mg via INTRAVENOUS
  Filled 2017-10-02 (×5): qty 2

## 2017-10-02 MED ORDER — SODIUM CHLORIDE 0.9 % IV SOLN
INTRAVENOUS | Status: DC
  Administered 2017-10-02: 17:00:00 via INTRAVENOUS
  Filled 2017-10-02 (×6): qty 1000

## 2017-10-02 MED ORDER — IOPAMIDOL (ISOVUE-300) INJECTION 61%
INTRAVENOUS | Status: AC
  Filled 2017-10-02: qty 30

## 2017-10-02 MED ORDER — ADULT MULTIVITAMIN W/MINERALS CH: 1.0000 | ORAL_TABLET | Freq: Every day | ORAL | Status: DC

## 2017-10-02 MED ORDER — POLYETHYLENE GLYCOL 3350 17 G PO PACK: 17.0000 g | PACK | Freq: Every day | ORAL | Status: DC | PRN

## 2017-10-02 MED ORDER — PIPERACILLIN-TAZOBACTAM 3.375 G IVPB
3.3750 g | Freq: Three times a day (TID) | INTRAVENOUS | Status: DC
  Administered 2017-10-02 – 2017-10-11 (×26): 3.375 g via INTRAVENOUS
  Filled 2017-10-02 (×27): qty 50

## 2017-10-02 MED ORDER — MORPHINE SULFATE (PF) 4 MG/ML IV SOLN
4.0000 mg | Freq: Once | INTRAVENOUS | Status: AC
  Administered 2017-10-02: 4 mg via INTRAVENOUS
  Filled 2017-10-02: qty 1

## 2017-10-02 MED ORDER — HYDRALAZINE HCL 20 MG/ML IJ SOLN
10.0000 mg | Freq: Four times a day (QID) | INTRAMUSCULAR | Status: DC | PRN
  Administered 2017-10-04: 10 mg via INTRAVENOUS
  Filled 2017-10-02: qty 1

## 2017-10-02 MED ORDER — SODIUM CHLORIDE 0.9% FLUSH
3.0000 mL | Freq: Two times a day (BID) | INTRAVENOUS | Status: DC
  Administered 2017-10-02: 3 mL via INTRAVENOUS

## 2017-10-02 MED ORDER — ACETAMINOPHEN 325 MG PO TABS: 650.0000 mg | ORAL_TABLET | Freq: Once | ORAL | Status: DC

## 2017-10-02 MED ORDER — BENAZEPRIL HCL 20 MG PO TABS: 20.0000 mg | ORAL_TABLET | Freq: Every day | ORAL | Status: DC

## 2017-10-02 MED ORDER — ACETAMINOPHEN 650 MG RE SUPP
650.0000 mg | Freq: Four times a day (QID) | RECTAL | Status: DC | PRN
  Administered 2017-10-03: 650 mg via RECTAL
  Filled 2017-10-02: qty 1

## 2017-10-02 MED ORDER — ALBUTEROL SULFATE (2.5 MG/3ML) 0.083% IN NEBU: 2.5000 mg | INHALATION_SOLUTION | RESPIRATORY_TRACT | Status: DC | PRN

## 2017-10-02 MED ORDER — LORAZEPAM 2 MG/ML IJ SOLN
1.0000 mg | INTRAMUSCULAR | Status: DC | PRN
  Administered 2017-10-02: 1 mg via INTRAVENOUS
  Filled 2017-10-02: qty 1

## 2017-10-02 MED ORDER — SODIUM CHLORIDE 0.9 % IV SOLN: 250.0000 mL | INTRAVENOUS | Status: DC | PRN

## 2017-10-02 MED ORDER — ACETAMINOPHEN 650 MG RE SUPP
650.0000 mg | Freq: Once | RECTAL | Status: AC
  Administered 2017-10-02: 650 mg via RECTAL
  Filled 2017-10-02: qty 1

## 2017-10-02 MED ORDER — GADOBENATE DIMEGLUMINE 529 MG/ML IV SOLN: 20.0000 mL | Freq: Once | INTRAVENOUS | Status: DC | PRN

## 2017-10-02 MED ORDER — PIPERACILLIN-TAZOBACTAM 3.375 G IVPB 30 MIN
3.3750 g | Freq: Once | INTRAVENOUS | Status: AC
  Administered 2017-10-02: 3.375 g via INTRAVENOUS
  Filled 2017-10-02 (×2): qty 50

## 2017-10-02 MED ORDER — SODIUM CHLORIDE 0.9 % IV BOLUS (SEPSIS): 500.0000 mL | Freq: Once | INTRAVENOUS | Status: DC

## 2017-10-02 MED ORDER — TURMERIC 450 MG PO CAPS: 1.0000 | ORAL_CAPSULE | Freq: Every day | ORAL | Status: DC

## 2017-10-02 MED ORDER — ALPHA-LIPOIC ACID 600 MG PO CAPS: 600.0000 mg | ORAL_CAPSULE | Freq: Every day | ORAL | Status: DC

## 2017-10-02 MED ORDER — INSULIN ASPART 100 UNIT/ML ~~LOC~~ SOLN: 0.0000 [IU] | Freq: Three times a day (TID) | SUBCUTANEOUS | Status: DC

## 2017-10-02 MED ORDER — TRAZODONE HCL 50 MG PO TABS
50.0000 mg | ORAL_TABLET | Freq: Every evening | ORAL | Status: DC | PRN
  Administered 2017-10-03: 50 mg via ORAL
  Filled 2017-10-02: qty 1

## 2017-10-02 MED ORDER — ACETYLCARNITINE HCL 500 MG PO CAPS: 500.0000 mg | ORAL_CAPSULE | Freq: Every day | ORAL | Status: DC

## 2017-10-02 MED ORDER — ONDANSETRON HCL 4 MG/2ML IJ SOLN
4.0000 mg | Freq: Once | INTRAMUSCULAR | Status: AC
Start: 2017-10-02 — End: 2017-10-02
  Administered 2017-10-02: 4 mg via INTRAVENOUS
  Filled 2017-10-02: qty 2

## 2017-10-02 MED ORDER — ACETAMINOPHEN 325 MG PO TABS
650.0000 mg | ORAL_TABLET | Freq: Four times a day (QID) | ORAL | Status: DC | PRN
  Administered 2017-10-02 – 2017-10-03 (×2): 650 mg via ORAL
  Filled 2017-10-02 (×3): qty 2

## 2017-10-02 MED ORDER — SODIUM CHLORIDE 0.9 % IV SOLN
INTRAVENOUS | Status: DC
  Administered 2017-10-02 – 2017-10-03 (×3): via INTRAVENOUS

## 2017-10-02 NOTE — ED Notes (Signed)
Dr Kennon Holter repaged at this time for pt Hypotension

## 2017-10-02 NOTE — Telephone Encounter (Signed)
   Answer Assessment - Initial Assessment Questions 1. VOMITING SEVERITY: "How many times have you vomited in the past 24 hours?"     - MILD:  1 - 2 times/day    - MODERATE: 3 - 5 times/day, decreased oral intake without significant weight loss or symptoms of dehydration    - SEVERE: 6 or more times/day, vomits everything or nearly everything, with significant weight loss, symptoms of dehydration      6-7 2. ONSET: "When did the vomiting begin?"      Started Sunday 3. FLUIDS: "What fluids or food have you vomited up today?" "Have you been able to keep any fluids down?"     Not able to keep anything down 4. ABDOMINAL PAIN: "Are your having any abdominal pain?" If yes : "How bad is it and what does it feel like?" (e.g., crampy, dull, intermittent, constant)      Cramping 5. DIARRHEA: "Is there any diarrhea?" If so, ask: "How many times today?"      Yes - 6-7 in the past 24 hours 6. CONTACTS: "Is there anyone else in the family with the same symptoms?"      No 7. CAUSE: "What do you think is causing your vomiting?"     Unsure 8. HYDRATION STATUS: "Any signs of dehydration?" (e.g., dry mouth [not only dry lips], too weak to stand) "When did you last urinate?"     Keeping power aid down 9. OTHER SYMPTOMS: "Do you have any other symptoms?" (e.g., fever, headache, vertigo, vomiting blood or coffee grounds, recent head injury)     Yes 102 Tuesday 10. PREGNANCY: "Is there any chance you are pregnant?" "When was your last menstrual period?"       No  Protocols used: [facility]

## 2017-10-02 NOTE — ED Notes (Signed)
Gave report to Estill Bamberg, Therapist, sports for [employer].

## 2017-10-02 NOTE — ED Notes (Signed)
Family at bedside.  Wife and daughter

## 2017-10-02 NOTE — Progress Notes (Signed)
GI is aware of this patient.  Abdominal pain, nausea, vomiting.  Elevated AST and ALT with normal total bili and ALP.  Lipase normal.  Ultrasound shows gallstones vs polyps and pericholecystic fluid, CBD 8 mm.  MRCP has been ordered.  Will check results of MRCP tomorrow and if positive for CBD stone then will do formal consult.  Viral hepatitis studies already ordered as well.  Will follow-up repeat LFT's in AM.   Patient didn't tolerate MRI, plan to send him [facility] for MRCP as they have bigger MRI machine.  Full consult note to follow K. Denzil Magnuson , MD [phone] Mon-Fri 8a-5p [phone] after 5p, weekends, holidays

## 2017-10-02 NOTE — ED Notes (Signed)
XKennon Holter notified of BP ok to send pt to floor 1400

## 2017-10-02 NOTE — ED Notes (Signed)
Pt returned from MRI; could not complete MRI

## 2017-10-02 NOTE — ED Triage Notes (Signed)
Patient reports left sided abdominal pain since Sunday.  Reports 6-7 episodes of both vomiting and diarrhea over the past 24 hours.  Denies blood in stool, hematuria, dysuria.

## 2017-10-02 NOTE — ED Notes (Signed)
Patient is resting comfortably. 

## 2017-10-02 NOTE — H&P (Signed)
Patient Demographics:    Timothy Chapman, is a 72 y.o. male  MRN: [id number]   DOB - 1946-01-28  Admit Date - 10/02/2017  Outpatient Primary MD for the patient is Copland, Gay Filler, MD   Assessment & Plan:    Principal Problem:   Abdominal pain Active Problems:   HYPERTENSION, BENIGN ESSENTIAL, UNCONTROLLED  Gallbladder  US- RUQ  Ultrasound shows gallstones vs polyps and pericholecystic fluid, CBD 8 mm.   1)Abd Pain/N/V/Elevated LFTs-   ALT to AST ratio is more than 2:1, so viral hepatitis profile been ordered to rule out acute viral hepatitis, MRCP ordered to rule out gall bladder and biliary etiology, discussed with GI service official GI consult pending findings of MRCP, PRN opiates and PRN antiemetics at this time.  Avoid hepatotoxic agents.  Lipase is not elevated.  Patient admits to EtOH use  2)Abdominal x-rays suggest possible SBO, patient has been kept n.p.o, MRCP pending, surgical consult will depend on findings of MRCP   3)AKI- Elevated creatinine of 3.4, previously renal function was normal with a creatinine of 1.1, hydrate, avoid nephrotoxic agents , hold HCTZ and metformin due to renal concerns at this time  4)Sepsis secondary to presumed GI etiology-patient presented with fevers, cardiac, borderline hypotension which responded to IV fluids, lactic acid peaked at 2.48, after IV fluids lactic acid is down to 1.3, patient received Zosyn and vancomycin in ED, discontinue vancomycin, but continue Zosyn pending cultures and MRCP findings  5)DM- Pt  is currently n.p.o, Allow some permissive Hyperglycemia rather than risk life-threatening hypoglycemia in a patient with unreliable oral intake.  Hold glipizide, Victoza and metformin . use Novolog/Humalog Sliding scale insulin with Accu-Cheks/Fingersticks as  ordered   With History of - Reviewed by me  Past Medical History:  Diagnosis Date  . Anxiety   . Arthritis   . Depression   . Diabetes mellitus without complication ([place])   . GERD (gastroesophageal reflux disease)   . Hypertension   . Neuromuscular disorder ([place])   . Sleep apnea       Past Surgical History:  Procedure Laterality Date  . BACK SURGERY     X2  . REPLACEMENT TOTAL KNEE Left   . ROTATOR CUFF REPAIR      Chief Complaint  Patient presents with  . Abdominal Pain      HPI:    Timothy Chapman  is a 72 y.o. male past medical history relevant for obesity, diabetes, hypertension and GERD who presents from [facility] with concerns about fever, chills, abdominal pain and nausea with emesis for the last 3 days.   Additional history obtained from patient wife and daughter at bedside, apparently no sick contacts at home  No chest pains no palpitations no dizziness no leg pain leg swelling no pleuritic symptoms  Emesis is without bile or blood, no diarrhea, no sick contacts  In ED  Elevated AST and ALT with normal total bili and  ALP noted,(ALT to AST ratio is more than 2:1).  WBC 10.4, lipase 30, AST 89, ALT 259, alk phos 73, total bilirubin 1.0,  Lipase normal. RUQ  Ultrasound shows gallstones vs polyps and pericholecystic fluid, CBD 8 mm.  MRCP pending, viral hepatitis profile pending  In ED--- abdominal x-rays suggest possible SBO, patient has been kept n.p.o.  In ED changes noted to have elevated creatinine of 3.4, previously renal function was normal with a creatinine of 1.1   Review of systems:    In addition to the HPI above,   A full 12 point Review of 10 Systems was done, except as stated above, all other Review of 10 Systems were negative.    Social History:  Reviewed by me    Social History   Tobacco Use  . Smoking status: Former Smoker    Last attempt to quit: 09/18/1975    Years since quitting: 42.0  . Smokeless tobacco: Never  Used  Substance Use Topics  . Alcohol use: Yes    Alcohol/week: 0.0 oz    Comment: Rarely      Family History :  Reviewed by me   Family History  Problem Relation Age of Onset  . Heart failure Father   . Heart disease Father   . Emphysema Mother      Home Medications:   Prior to Admission medications   Medication Sig Start Date End Date Taking? Authorizing Provider  Alpha-Lipoic Acid 600 MG CAPS Take 600 mg by mouth daily.   Yes [provider]  benazepril (LOTENSIN) 20 MG tablet Take 1 tablet (20 mg total) by mouth daily. 07/08/17  Yes Copland, Gay Filler, MD  diclofenac (VOLTAREN) 75 MG EC tablet Take 1 tablet (75 mg total) by mouth 2 (two) times daily. Use as needed for joint pains 07/19/17  Yes Copland, Gay Filler, MD  glipiZIDE (GLUCOTROL) 10 MG tablet Take 1 tablet (10 mg total) by mouth 2 (two) times daily before a meal. 07/08/17  Yes Copland, Gay Filler, MD  hydrochlorothiazide (HYDRODIURIL) 25 MG tablet Take 0.5 tablets (12.5 mg total) by mouth daily. 07/08/17  Yes Copland, Gay Filler, MD  Melatonin 3 MG TABS Take 1 tablet by mouth at bedtime.    Yes [provider]  Multiple Vitamin (MULTIVITAMIN) tablet Take 1 tablet by mouth daily.   Yes [provider]  Turmeric 450 MG CAPS Take 1 tablet by mouth daily.    Yes [provider]  Acetylcarnitine HCl 500 MG CAPS Take 500 mg by mouth daily.    [provider]  Alcohol Swabs (ALCOHOL PREP) PADS Use prior to insulin dose 08/22/17   Copland, Jessica C, MD  glucose blood test strip Use as instructed 06/28/16   Copland, Gay Filler, MD  Insulin Pen Needle (PEN NEEDLES) 31G X 5 MM MISC 1 each by Does not apply route daily. 08/22/17   Copland, Gay Filler, MD  liraglutide (VICTOZA) 18 MG/3ML SOPN Inject 1.8 mg [place] daily 07/08/17   Copland, Gay Filler, MD  metFORMIN (GLUCOPHAGE) 1000 MG tablet Take 1 tablet (1,000 mg total) by mouth 2 (two) times daily with a meal. 07/08/17   Copland, Gay Filler, MD   niacin (NIASPAN) 500 MG CR tablet Take 1 tablet (500 mg total) by mouth at bedtime. Patient not taking: Reported on 10/02/2017 02/08/16   Copland, Gay Filler, MD  omeprazole (PRILOSEC) 40 MG capsule Take 1 capsule (40 mg total) by mouth daily. Patient taking differently: Take 40 mg by mouth  daily as needed (heartburn).  07/08/17   Copland, Gay Filler, MD  tadalafil (CIALIS) 5 MG tablet Take 1 tablet (5 mg total) daily as needed by mouth for erectile dysfunction (and BPH). 07/24/17   Copland, Gay Filler, MD     Allergies:     Allergies  Allergen Reactions  . Codeine      Physical Exam:   Vitals  Blood pressure (!) 102/40, pulse (!) 103, temperature (!) 100.8 F (38.2 C), temperature source Rectal, resp. rate (!) 22, height '5\' 9"'$  (1.753 m), weight 104.3 kg (230 lb), SpO2 95 %.  Physical Examination: General appearance - alert, well appearing, and in no distress  Mental status - alert, oriented to person, place, and time,  Eyes - sclera anicteric Neck - supple, no JVD elevation , Chest - clear  to auscultation bilaterally, symmetrical air movement,  Heart - S1 and S2 normal,  Abdomen - soft, tenderness is worse in the right upper quadrant area without rebound or guarding, no CVA area tenderness  Neurological - screening mental status exam normal, neck supple without rigidity, cranial nerves II through XII intact, DTR's normal and symmetric Extremities - no pedal edema noted, intact peripheral pulses  Skin - warm, dry Psych-affect is flat   Data Review:    CBC Recent Labs  Lab 10/02/17 0948  WBC 10.4  HGB 12.3*  HCT 36.3*  PLT 238  MCV 85.2  MCH 28.9  MCHC 33.9  RDW 13.8  LYMPHSABS 1.2  MONOABS 0.9  EOSABS 0.1  BASOSABS 0.0   ------------------------------------------------------------------------------------------------------------------  Chemistries  Recent Labs  Lab 10/02/17 0948  NA 133*  K 4.3  CL 100*  CO2 19*  GLUCOSE 222*  BUN 47*  CREATININE 3.42*   CALCIUM 9.1  AST 89*  ALT 259*  ALKPHOS 73  BILITOT 1.0   ------------------------------------------------------------------------------------------------------------------ estimated creatinine clearance is 24.2 mL/min (A) (by C-G formula based on SCr of 3.42 mg/dL (H)). ------------------------------------------------------------------------------------------------------------------ No results for input(s): TSH, T4TOTAL, T3FREE, THYROIDAB in the last 72 hours.  Invalid input(s): FREET3   Coagulation profile No results for input(s): INR, PROTIME in the last 168 hours. ------------------------------------------------------------------------------------------------------------------- No results for input(s): DDIMER in the last 72 hours. -------------------------------------------------------------------------------------------------------------------  Cardiac Enzymes No results for input(s): CKMB, TROPONINI, MYOGLOBIN in the last 168 hours.  Invalid input(s): CK ------------------------------------------------------------------------------------------------------------------ No results found for: BNP   ---------------------------------------------------------------------------------------------------------------  Urinalysis    Component Value Date/Time   COLORURINE YELLOW 04/11/2007 [place] 04/11/2007 0917   LABSPEC 1.030 04/11/2007 0917   PHURINE 5.0 04/11/2007 0917   GLUCOSEU NEGATIVE 04/11/2007 0917   HGBUR NEGATIVE 04/11/2007 0917   BILIRUBINUR SMALL (A) 04/11/2007 0917   KETONESUR 15 (A) 04/11/2007 0917   PROTEINUR NEGATIVE 04/11/2007 0917   UROBILINOGEN 0.2 04/11/2007 0917   NITRITE NEGATIVE 04/11/2007 0917   LEUKOCYTESUR  04/11/2007 0917    NEGATIVE MICROSCOPIC NOT DONE ON URINES WITH NEGATIVE PROTEIN, BLOOD, LEUKOCYTES, NITRITE, OR GLUCOSE <1000 mg/dL.     ----------------------------------------------------------------------------------------------------------------   Imaging Results:    Dg Abdomen Acute W/chest  Result Date: 10/02/2017 CLINICAL DATA:  72 year old male with abdominal pain, distention, vomiting, diarrhea. Symptoms for 3 days. EXAM: DG ABDOMEN ACUTE W/ 1V CHEST COMPARISON:  Chest radiograph 04/11/2007. Lumbar radiographs 02/11/2014. FINDINGS: Lung volumes are stable and within normal limits. Normal cardiac size and mediastinal contours. Visualized tracheal air column is within normal limits. No pneumothorax or pneumoperitoneum. No abnormal pulmonary opacity. Multiple upper limits of normal to abnormally dilated gas containing small bowel  loops in the mid abdomen, up to 4.5-5 centimeters diameter. Paucity of large bowel gas. Other abdominal and pelvic visceral contours appear normal. There are surgical clips about the pubic symphysis which are new since 2,015. Degenerative changes in the spine. No acute osseous abnormality identified. IMPRESSION: 1. Bowel-gas pattern suggesting a small bowel obstruction or small bowel ileus. No free air. 2.  No acute cardiopulmonary abnormality. Electronically Signed   By: Genevie Ann M.D.   On: 10/02/2017 10:18   US Abdomen Limited Ruq  Result Date: 10/02/2017 CLINICAL DATA:  Abdominal pain EXAM: ULTRASOUND ABDOMEN LIMITED RIGHT UPPER QUADRANT COMPARISON:  None. FINDINGS: Gallbladder: 2 small echogenic foci in the gallbladder do not move or shadow and could be polyps or stones. Gallbladder wall 2 mm. Mild pericholecystic fluid. Negative sonographic Murphy sign Common bile duct: Diameter: Mildly prominent 8 mm Liver: Echogenic liver difficult to image without focal abnormality. Portal vein is patent on color Doppler imaging with normal direction of blood flow towards the liver. IMPRESSION: Small polyps versus adherent gallstones. Pericholecystic fluid. Negative sonographic Murphy sign. Common bile duct  mildly dilated 8 mm. Fatty liver Electronically Signed   By: Franchot Gallo M.D.   On: 10/02/2017 13:05    Radiological Exams on Admission: Dg Abdomen Acute W/chest  Result Date: 10/02/2017 CLINICAL DATA:  72 year old male with abdominal pain, distention, vomiting, diarrhea. Symptoms for 3 days. EXAM: DG ABDOMEN ACUTE W/ 1V CHEST COMPARISON:  Chest radiograph 04/11/2007. Lumbar radiographs 02/11/2014. FINDINGS: Lung volumes are stable and within normal limits. Normal cardiac size and mediastinal contours. Visualized tracheal air column is within normal limits. No pneumothorax or pneumoperitoneum. No abnormal pulmonary opacity. Multiple upper limits of normal to abnormally dilated gas containing small bowel loops in the mid abdomen, up to 4.5-5 centimeters diameter. Paucity of large bowel gas. Other abdominal and pelvic visceral contours appear normal. There are surgical clips about the pubic symphysis which are new since 2,015. Degenerative changes in the spine. No acute osseous abnormality identified. IMPRESSION: 1. Bowel-gas pattern suggesting a small bowel obstruction or small bowel ileus. No free air. 2.  No acute cardiopulmonary abnormality. Electronically Signed   By: Genevie Ann M.D.   On: 10/02/2017 10:18   US Abdomen Limited Ruq  Result Date: 10/02/2017 CLINICAL DATA:  Abdominal pain EXAM: ULTRASOUND ABDOMEN LIMITED RIGHT UPPER QUADRANT COMPARISON:  None. FINDINGS: Gallbladder: 2 small echogenic foci in the gallbladder do not move or shadow and could be polyps or stones. Gallbladder wall 2 mm. Mild pericholecystic fluid. Negative sonographic Murphy sign Common bile duct: Diameter: Mildly prominent 8 mm Liver: Echogenic liver difficult to image without focal abnormality. Portal vein is patent on color Doppler imaging with normal direction of blood flow towards the liver. IMPRESSION: Small polyps versus adherent gallstones. Pericholecystic fluid. Negative sonographic Murphy sign. Common bile duct mildly  dilated 8 mm. Fatty liver Electronically Signed   By: Franchot Gallo M.D.   On: 10/02/2017 13:05    DVT Prophylaxis -SCD   AM Labs Ordered, also please review Full Orders  Family Communication: Admission, patients condition and plan of care including tests being ordered have been discussed with the patient and daughter who indicate understanding and agree with the plan   Code Status - Full Code  Likely DC to  home  Condition   fair  Roxan Hockey M.D on 10/02/2017 at 6:44 PM   Between 7am to 7pm - Pager - [phone] After 7pm go to [email or web address] - password [email or web address]  [facility] - Office  [phone]  Voice Recognition Viviann Spare dictation system was used to create this note, attempts have been made to correct errors. Please contact the author with questions and/or clarifications.

## 2017-10-02 NOTE — ED Notes (Signed)
Pt taken to MRI  

## 2017-10-02 NOTE — Telephone Encounter (Signed)
FYI. Pt headed to ED.  

## 2017-10-02 NOTE — Telephone Encounter (Signed)
Pt. Instructed to go to ED per protocol. Verbalizes understanding.

## 2017-10-02 NOTE — ED Notes (Signed)
Dr Kennon Holter paged d/t abnormal BP no return call at this time

## 2017-10-02 NOTE — Progress Notes (Signed)
Pharmacy Antibiotic Note  Timothy Chapman is a 72 y.o. male admitted on 10/02/2017 with unknown sorce of infection, potentially inta-abdominal.    Plan: Vanc 1 g q24h Zosyn 3.375 gm iv q 8h Monitor renal fx vt prn  Height: 5\' 9"  (175.3 cm) Weight: 230 lb (104.3 kg) IBW/kg (Calculated) : 70.7  Temp (24hrs), Avg:99.4 F (37.4 C), Min:98.7 F (37.1 C), Max:100.4 F (38 C)  Recent Labs  Lab 10/02/17 0948 10/02/17 1106  WBC 10.4  --   CREATININE 3.42*  --   LATICACIDVEN  --  1.54    Estimated Creatinine Clearance: 24.2 mL/min (A) (by C-G formula based on SCr of 3.42 mg/dL (H)).    Allergies  Allergen Reactions  . Codeine    Levester Fresh, PharmD, BCPS, BCCCP Clinical Pharmacist Clinical phone for 10/02/2017 from 7a-3:30p: [phone] If after 3:30p, please call main pharmacy at: [phone] 10/02/2017 12:57 PM

## 2017-10-02 NOTE — ED Notes (Signed)
ED TO INPATIENT HANDOFF REPORT  Name/Age/Gender Timothy Chapman [place] 72 y.o. male  Code Status    Code Status Orders  (From admission, onward)        Start     Ordered   10/02/17 1857  Full code  Continuous     10/02/17 1856    Code Status History    Date Active Date Inactive Code Status Order ID Comments User Context   This patient has a current code status but no historical code status.      Home/SNF/Other Home  Chief Complaint Abdominal pain [R10.9]  Level of Care/Admitting Diagnosis ED Disposition    ED Disposition Condition [facility] Area: [place] [[id number]]  Level of Care: Telemetry [5]  Admit to tele based on following criteria: Other see comments  Comments: tachy  Diagnosis: Abdominal pain [626948]  Admitting Physician: Morrison Old  Attending Physician: Morrison Old  Estimated length of stay: 3 - 4 days  Certification:: I certify this patient will need inpatient services for at least 2 midnights  Bed request comments: Tele  PT Class (Do Not Modify): Inpatient [101]  PT Acc Code (Do Not Modify): Private [1]       Medical History Past Medical History:  Diagnosis Date  . Anxiety   . Arthritis   . Depression   . Diabetes mellitus without complication ([place])   . GERD (gastroesophageal reflux disease)   . Hypertension   . Neuromuscular disorder ([place])   . Sleep apnea     Allergies Allergies  Allergen Reactions  . Codeine     IV Location/Drains/Wounds Patient Lines/Drains/Airways Status   Active Line/Drains/Airways    Name:   Placement date:   Placement time:   Site:   Days:   Peripheral IV 10/02/17 Left Antecubital   10/02/17    0954    Antecubital   less than 1   Peripheral IV 10/02/17 Right Antecubital   10/02/17    1539    Antecubital   less than 1          Labs/Imaging Results for orders placed or performed during the hospital encounter of 10/02/17 (from the past 48 hour(s))   Comprehensive metabolic panel     Status: Abnormal   Collection Time: 10/02/17  9:48 AM  Result Value Ref Range   Sodium 133 (L) 135 - 145 mmol/L   Potassium 4.3 3.5 - 5.1 mmol/L   Chloride 100 (L) 101 - 111 mmol/L   CO2 19 (L) 22 - 32 mmol/L   Glucose, Bld 222 (H) 65 - 99 mg/dL   BUN 47 (H) 6 - 20 mg/dL   Creatinine, Ser 3.42 (H) 0.61 - 1.24 mg/dL   Calcium 9.1 8.9 - 10.3 mg/dL   Total Protein 7.5 6.5 - 8.1 g/dL   Albumin 3.7 3.5 - 5.0 g/dL   AST 89 (H) 15 - 41 U/L   ALT 259 (H) 17 - 63 U/L   Alkaline Phosphatase 73 38 - 126 U/L   Total Bilirubin 1.0 0.3 - 1.2 mg/dL   GFR calc non Af Amer 17 (L) >60 mL/min   GFR calc Af Amer 20 (L) >60 mL/min    Comment: (NOTE) The eGFR has been calculated using the CKD EPI equation. This calculation has not been validated in all clinical situations. eGFR's persistently <60 mL/min signify possible Chronic Kidney Disease.    Anion gap 14 5 - 15  Lipase, blood  Status: None   Collection Time: 10/02/17  9:48 AM  Result Value Ref Range   Lipase 30 11 - 51 U/L  CBC with Differential     Status: Abnormal   Collection Time: 10/02/17  9:48 AM  Result Value Ref Range   WBC 10.4 4.0 - 10.5 K/uL   RBC 4.26 4.22 - 5.81 MIL/uL   Hemoglobin 12.3 (L) 13.0 - 17.0 g/dL   HCT 36.3 (L) 39.0 - 52.0 %   MCV 85.2 78.0 - 100.0 fL   MCH 28.9 26.0 - 34.0 pg   MCHC 33.9 30.0 - 36.0 g/dL   RDW 13.8 11.5 - 15.5 %   Platelets 238 150 - 400 K/uL   Neutrophils Relative % 78 %   Neutro Abs 8.1 (H) 1.7 - 7.7 K/uL   Lymphocytes Relative 12 %   Lymphs Abs 1.2 0.7 - 4.0 K/uL   Monocytes Relative 9 %   Monocytes Absolute 0.9 0.1 - 1.0 K/uL   Eosinophils Relative 1 %   Eosinophils Absolute 0.1 0.0 - 0.7 K/uL   Basophils Relative 0 %   Basophils Absolute 0.0 0.0 - 0.1 K/uL  I-Stat CG4 Lactic Acid, ED     Status: None   Collection Time: 10/02/17 11:06 AM  Result Value Ref Range   Lactic Acid, Venous 1.54 0.5 - 1.9 mmol/L  Influenza panel by PCR (type A & B)      Status: None   Collection Time: 10/02/17 11:07 AM  Result Value Ref Range   Influenza A By PCR NEGATIVE NEGATIVE   Influenza B By PCR NEGATIVE NEGATIVE    Comment: (NOTE) The Xpert Xpress Flu assay is intended as an aid in the diagnosis of  influenza and should not be used as a sole basis for treatment.  This  assay is FDA approved for nasopharyngeal swab specimens only. Nasal  washings and aspirates are unacceptable for Xpert Xpress Flu testing. Performed at [facility], [place] [address], [place], [place] [address]   I-Stat CG4 Lactic Acid, ED     Status: Abnormal   Collection Time: 10/02/17  1:23 PM  Result Value Ref Range   Lactic Acid, Venous 2.48 (HH) 0.5 - 1.9 mmol/L   Comment NOTIFIED PHYSICIAN   I-Stat CG4 Lactic Acid, ED     Status: None   Collection Time: 10/02/17  3:49 PM  Result Value Ref Range   Lactic Acid, Venous 1.33 0.5 - 1.9 mmol/L   Dg Abdomen Acute W/chest  Result Date: 10/02/2017 CLINICAL DATA:  72 year old male with abdominal pain, distention, vomiting, diarrhea. Symptoms for 3 days. EXAM: DG ABDOMEN ACUTE W/ 1V CHEST COMPARISON:  Chest radiograph 04/11/2007. Lumbar radiographs 02/11/2014. FINDINGS: Lung volumes are stable and within normal limits. Normal cardiac size and mediastinal contours. Visualized tracheal air column is within normal limits. No pneumothorax or pneumoperitoneum. No abnormal pulmonary opacity. Multiple upper limits of normal to abnormally dilated gas containing small bowel loops in the mid abdomen, up to 4.5-5 centimeters diameter. Paucity of large bowel gas. Other abdominal and pelvic visceral contours appear normal. There are surgical clips about the pubic symphysis which are new since 2,015. Degenerative changes in the spine. No acute osseous abnormality identified. IMPRESSION: 1. Bowel-gas pattern suggesting a small bowel obstruction or small bowel ileus. No free air. 2.  No acute cardiopulmonary abnormality. Electronically Signed   By: Genevie Ann M.D.   On: 10/02/2017 10:18   US Abdomen Limited Ruq  Result Date: 10/02/2017 CLINICAL DATA:  Abdominal pain EXAM: ULTRASOUND ABDOMEN LIMITED RIGHT UPPER QUADRANT COMPARISON:  None. FINDINGS: Gallbladder: 2 small echogenic foci in the gallbladder do not move or shadow and could be polyps or stones. Gallbladder wall 2 mm. Mild pericholecystic fluid. Negative sonographic Murphy sign Common bile duct: Diameter: Mildly prominent 8 mm Liver: Echogenic liver difficult to image without focal abnormality. Portal vein is patent on color Doppler imaging with normal direction of blood flow towards the liver. IMPRESSION: Small polyps versus adherent gallstones. Pericholecystic fluid. Negative sonographic Murphy sign. Common bile duct mildly dilated 8 mm. Fatty liver Electronically Signed   By: Franchot Gallo M.D.   On: 10/02/2017 13:05    Pending Labs Unresulted Labs (From admission, onward)   Start     Ordered   10/03/17 0500  CBC  Tomorrow morning,   R     10/02/17 1606   10/03/17 0500  Comprehensive metabolic panel  Tomorrow morning,   R     10/02/17 1606   10/03/17 0500  Lipase, blood  Tomorrow morning,   R     10/02/17 1606   10/02/17 1908  Culture, blood (Routine X 2) w Reflex to ID Panel  BLOOD CULTURE X 2,   R    Comments:  Blood cultures x 2 from peripheral line   Question:  Patient immune status  Answer:  Normal   10/02/17 1907   10/02/17 1040  Hepatitis panel, acute  STAT,   STAT     10/02/17 1040   10/02/17 0944  Urinalysis, Routine w reflex microscopic  STAT,   STAT     10/02/17 0944      Vitals/Pain Today's Vitals   10/02/17 1529 10/02/17 1700 10/02/17 1704 10/02/17 1732  BP:   (!) 102/40   Pulse:  (!) 103    Resp:  (!) 22    Temp: 98.6 F (37 C) (!) 100.8 F (38.2 C)    TempSrc: Oral Rectal    SpO2:  95%    Weight:      Height:      PainSc:    4     Isolation Precautions Droplet precaution  Medications Medications  piperacillin-tazobactam (ZOSYN) IVPB 3.375 g  (not administered)  iopamidol (ISOVUE-300) 61 % injection (not administered)  sodium chloride 0.9 % 1,000 mL infusion ( Intravenous Rate/Dose Change 10/02/17 2038)  multivitamin tablet 1 tablet (not administered)  Acetylcarnitine HCl CAPS 500 mg (not administered)  Alpha-Lipoic Acid CAPS 600 mg (not administered)  pantoprazole (PROTONIX) EC tablet 40 mg (not administered)  sodium chloride flush (NS) 0.9 % injection 3 mL (not administered)  sodium chloride flush (NS) 0.9 % injection 3 mL (not administered)  0.9 %  sodium chloride infusion (not administered)  acetaminophen (TYLENOL) tablet 650 mg (650 mg Oral Given 10/02/17 1722)    Or  acetaminophen (TYLENOL) suppository 650 mg ( Rectal See Alternative 10/02/17 1722)  traZODone (DESYREL) tablet 50 mg (not administered)  senna (SENOKOT) tablet 8.6 mg (not administered)  polyethylene glycol (MIRALAX / GLYCOLAX) packet 17 g (not administered)  ondansetron (ZOFRAN) tablet 4 mg (not administered)    Or  ondansetron (ZOFRAN) injection 4 mg (not administered)  albuterol (PROVENTIL) (2.5 MG/3ML) 0.083% nebulizer solution 2.5 mg (not administered)  heparin injection 5,000 Units (not administered)  0.9 %  sodium chloride infusion (not administered)  LORazepam (ATIVAN) injection 1 mg (1 mg Intravenous Given 10/02/17 1754)  gadobenate dimeglumine (MULTIHANCE) injection 20 mL (not administered)  hydrALAZINE (APRESOLINE) injection 10 mg (not administered)  insulin  aspart (novoLOG) injection 0-9 Units (not administered)  sodium chloride 0.9 % bolus 1,000 mL (not administered)  ondansetron (ZOFRAN) injection 4 mg (4 mg Intravenous Given 10/02/17 1012)  morphine 4 MG/ML injection 4 mg (4 mg Intravenous Given 10/02/17 1012)  sodium chloride 0.9 % bolus 1,000 mL (0 mLs Intravenous Stopped 10/02/17 1102)  sodium chloride 0.9 % bolus 1,000 mL (0 mLs Intravenous Stopped 10/02/17 1227)  acetaminophen (TYLENOL) suppository 650 mg (650 mg Rectal Given 10/02/17 1119)   sodium chloride 0.9 % bolus 1,000 mL (0 mLs Intravenous Stopped 10/02/17 1358)  piperacillin-tazobactam (ZOSYN) IVPB 3.375 g (0 g Intravenous Stopped 10/02/17 1352)    Mobility walks

## 2017-10-02 NOTE — Consult Note (Signed)
[facility] Surgery Consult Note  Timothy Chapman [facility] [birth year]  [id number].    Requesting MD: Jola Schmidt Chief Complaint/Reason for Consult: abdominal pain  HPI:  Timothy Chapman is a 72yo male transferred from [facility] to [facility] today with 3 days of diffuse, sharp and cramping abdominal pain.  States that the pain has gradually gotten worse. It is associated with nausea, vomiting, diarrhea, abdominal bloating, fever, and chills. TMAX 101.3. The pain is not worse in any one location on the abdomen. States that he had pain one time in the past that was similar, but it was due to food poisoning. He has been unable to eat or take his prescribed medications for 3 days. Abdominal xray concerning for SBO versus ileus therefore patient was transferred to [facility] for CT scan. RUQ u/s was first obtained and showed pericholecystic fluid, negative Murphy sign, and a mildly dilated common bile duct at 39m. WBC 10.4, lipase 30, AST 89, ALT 259, alk phos 73, total bilirubin 1.0, creatinine 3.42. Patient has been started on vancomycin and zosyn. He is being admitted to medicine. Gastroenterology has been consulted and recommend MRCP; this is pending.  PMH significant for DM, HTN, GERD Abdominal surgical history: none Anticoagulants: none Nonsmoker Retired Lives at home with wife  ROS: Review of Systems  Constitutional: Positive for chills and fever.  HENT: Negative.   Eyes: Negative.   Respiratory: Negative.   Cardiovascular: Negative.   Gastrointestinal: Positive for abdominal pain, diarrhea, nausea and vomiting. Negative for blood in stool, constipation and melena.  Genitourinary: Negative.   Musculoskeletal: Negative.   Skin: Negative.   Neurological: Positive for weakness.   All systems reviewed and otherwise negative except for as above  Family History  Problem Relation Age of Onset  . Heart failure Father   . Heart disease Father   . Emphysema Mother     Past Medical History:   Diagnosis Date  . Anxiety   . Arthritis   . Depression   . Diabetes mellitus without complication ([place]   . GERD (gastroesophageal reflux disease)   . Hypertension   . Neuromuscular disorder ([place]   . Sleep apnea     Past Surgical History:  Procedure Laterality Date  . BACK SURGERY     X2  . REPLACEMENT TOTAL KNEE Left   . ROTATOR CUFF REPAIR      Social History:  reports that he quit smoking about 42 years ago. he has never used smokeless tobacco. He reports that he drinks alcohol. His drug history is not on file.  Allergies:  Allergies  Allergen Reactions  . Codeine      (Not in a hospital admission)  Prior to Admission medications   Medication Sig Start Date End Date Taking? Authorizing Provider  Alpha-Lipoic Acid 600 MG CAPS Take 600 mg by mouth daily.   Yes [provider]  benazepril (LOTENSIN) 20 MG tablet Take 1 tablet (20 mg total) by mouth daily. 07/08/17  Yes Copland, JGay Filler MD  diclofenac (VOLTAREN) 75 MG EC tablet Take 1 tablet (75 mg total) by mouth 2 (two) times daily. Use as needed for joint pains 07/19/17  Yes Copland, JGay Filler MD  glipiZIDE (GLUCOTROL) 10 MG tablet Take 1 tablet (10 mg total) by mouth 2 (two) times daily before a meal. 07/08/17  Yes Copland, JGay Filler MD  hydrochlorothiazide (HYDRODIURIL) 25 MG tablet Take 0.5 tablets (12.5 mg total) by mouth daily. 07/08/17  Yes Copland, JGay Filler MD  Melatonin 3 MG TABS  Take 1 tablet by mouth at bedtime.    Yes [provider]  Multiple Vitamin (MULTIVITAMIN) tablet Take 1 tablet by mouth daily.   Yes [provider]  Turmeric 450 MG CAPS Take 1 tablet by mouth daily.    Yes [provider]  Acetylcarnitine HCl 500 MG CAPS Take 500 mg by mouth daily.    [provider]  Alcohol Swabs (ALCOHOL PREP) PADS Use prior to insulin dose 08/22/17   Copland, Jessica C, MD  glucose blood test strip Use as instructed 06/28/16   Copland, Gay Filler, MD  Insulin Pen  Needle (PEN NEEDLES) 31G X 5 MM MISC 1 each by Does not apply route daily. 08/22/17   Copland, Gay Filler, MD  liraglutide (VICTOZA) 18 MG/3ML SOPN Inject 1.8 mg Anselmo daily 07/08/17   Copland, Gay Filler, MD  metFORMIN (GLUCOPHAGE) 1000 MG tablet Take 1 tablet (1,000 mg total) by mouth 2 (two) times daily with a meal. 07/08/17   Copland, Gay Filler, MD  niacin (NIASPAN) 500 MG CR tablet Take 1 tablet (500 mg total) by mouth at bedtime. Patient not taking: Reported on 10/02/2017 02/08/16   Copland, Gay Filler, MD  omeprazole (PRILOSEC) 40 MG capsule Take 1 capsule (40 mg total) by mouth daily. Patient taking differently: Take 40 mg by mouth daily as needed (heartburn).  07/08/17   Copland, Gay Filler, MD  tadalafil (CIALIS) 5 MG tablet Take 1 tablet (5 mg total) daily as needed by mouth for erectile dysfunction (and BPH). 07/24/17   Copland, Gay Filler, MD    Blood pressure (!) 95/52, pulse 94, temperature 98.6 F (37 C), temperature source Oral, resp. rate (!) 28, height 5' 9"  (1.753 m), weight 230 lb (104.3 kg), SpO2 91 %. Physical Exam: General: pleasant, WD/WN white male who is laying in bed in NAD but appears uncomfortable HEENT: head is normocephalic, atraumatic.  Sclera are noninjected.  Pupils equal and round.  Ears and nose without any masses or lesions.  Mouth is dry. Dentition fair Heart: regular, rate, and rhythm.  No obvious murmurs, gallops, or rubs noted.  Palpable pedal pulses bilaterally Lungs: CTAB, no wheezes, rhonchi, or rales noted.  Respiratory effort nonlabored Abd: soft, distended, +BS, no masses, hernias, or organomegaly. Mild global tenderness without rebound or guarding, no focal tenderness. Negative Murphy sign MS: all 4 extremities are symmetrical with no cyanosis, clubbing, or edema. Skin: warm and dry with no masses, lesions, or rashes Psych: A&Ox3 with an appropriate affect. Neuro: cranial nerves grossly intact, extremity CSM intact bilaterally, normal speech  Results for  orders placed or performed during the hospital encounter of 10/02/17 (from the past 48 hour(s))  Comprehensive metabolic panel     Status: Abnormal   Collection Time: 10/02/17  9:48 AM  Result Value Ref Range   Sodium 133 (L) 135 - 145 mmol/L   Potassium 4.3 3.5 - 5.1 mmol/L   Chloride 100 (L) 101 - 111 mmol/L   CO2 19 (L) 22 - 32 mmol/L   Glucose, Bld 222 (H) 65 - 99 mg/dL   BUN 47 (H) 6 - 20 mg/dL   Creatinine, Ser 3.42 (H) 0.61 - 1.24 mg/dL   Calcium 9.1 8.9 - 10.3 mg/dL   Total Protein 7.5 6.5 - 8.1 g/dL   Albumin 3.7 3.5 - 5.0 g/dL   AST 89 (H) 15 - 41 U/L   ALT 259 (H) 17 - 63 U/L   Alkaline Phosphatase 73 38 - 126 U/L   Total Bilirubin  1.0 0.3 - 1.2 mg/dL   GFR calc non Af Amer 17 (L) >60 mL/min   GFR calc Af Amer 20 (L) >60 mL/min    Comment: (NOTE) The eGFR has been calculated using the CKD EPI equation. This calculation has not been validated in all clinical situations. eGFR's persistently <60 mL/min signify possible Chronic Kidney Disease.    Anion gap 14 5 - 15  Lipase, blood     Status: None   Collection Time: 10/02/17  9:48 AM  Result Value Ref Range   Lipase 30 11 - 51 U/L  CBC with Differential     Status: Abnormal   Collection Time: 10/02/17  9:48 AM  Result Value Ref Range   WBC 10.4 4.0 - 10.5 K/uL   RBC 4.26 4.22 - 5.81 MIL/uL   Hemoglobin 12.3 (L) 13.0 - 17.0 g/dL   HCT 36.3 (L) 39.0 - 52.0 %   MCV 85.2 78.0 - 100.0 fL   MCH 28.9 26.0 - 34.0 pg   MCHC 33.9 30.0 - 36.0 g/dL   RDW 13.8 11.5 - 15.5 %   Platelets 238 150 - 400 K/uL   Neutrophils Relative % 78 %   Neutro Abs 8.1 (H) 1.7 - 7.7 K/uL   Lymphocytes Relative 12 %   Lymphs Abs 1.2 0.7 - 4.0 K/uL   Monocytes Relative 9 %   Monocytes Absolute 0.9 0.1 - 1.0 K/uL   Eosinophils Relative 1 %   Eosinophils Absolute 0.1 0.0 - 0.7 K/uL   Basophils Relative 0 %   Basophils Absolute 0.0 0.0 - 0.1 K/uL  I-Stat CG4 Lactic Acid, ED     Status: None   Collection Time: 10/02/17 11:06 AM  Result Value  Ref Range   Lactic Acid, Venous 1.54 0.5 - 1.9 mmol/L  Influenza panel by PCR (type A & B)     Status: None   Collection Time: 10/02/17 11:07 AM  Result Value Ref Range   Influenza A By PCR NEGATIVE NEGATIVE   Influenza B By PCR NEGATIVE NEGATIVE    Comment: (NOTE) The Xpert Xpress Flu assay is intended as an aid in the diagnosis of  influenza and should not be used as a sole basis for treatment.  This  assay is FDA approved for nasopharyngeal swab specimens only. Nasal  washings and aspirates are unacceptable for Xpert Xpress Flu testing. Performed at [facility], [place] [address], [place], [place] [address]   I-Stat CG4 Lactic Acid, ED     Status: Abnormal   Collection Time: 10/02/17  1:23 PM  Result Value Ref Range   Lactic Acid, Venous 2.48 (HH) 0.5 - 1.9 mmol/L   Comment NOTIFIED PHYSICIAN   I-Stat CG4 Lactic Acid, ED     Status: None   Collection Time: 10/02/17  3:49 PM  Result Value Ref Range   Lactic Acid, Venous 1.33 0.5 - 1.9 mmol/L   Dg Abdomen Acute W/chest  Result Date: 10/02/2017 CLINICAL DATA:  72 year old male with abdominal pain, distention, vomiting, diarrhea. Symptoms for 3 days. EXAM: DG ABDOMEN ACUTE W/ 1V CHEST COMPARISON:  Chest radiograph 04/11/2007. Lumbar radiographs 02/11/2014. FINDINGS: Lung volumes are stable and within normal limits. Normal cardiac size and mediastinal contours. Visualized tracheal air column is within normal limits. No pneumothorax or pneumoperitoneum. No abnormal pulmonary opacity. Multiple upper limits of normal to abnormally dilated gas containing small bowel loops in the mid abdomen, up to 4.5-5 centimeters diameter. Paucity of large bowel gas. Other abdominal and pelvic  visceral contours appear normal. There are surgical clips about the pubic symphysis which are new since 2,015. Degenerative changes in the spine. No acute osseous abnormality identified. IMPRESSION: 1. Bowel-gas pattern suggesting a small bowel obstruction or small  bowel ileus. No free air. 2.  No acute cardiopulmonary abnormality. Electronically Signed   By: Genevie Ann M.D.   On: 10/02/2017 10:18   US Abdomen Limited Ruq  Result Date: 10/02/2017 CLINICAL DATA:  Abdominal pain EXAM: ULTRASOUND ABDOMEN LIMITED RIGHT UPPER QUADRANT COMPARISON:  None. FINDINGS: Gallbladder: 2 small echogenic foci in the gallbladder do not move or shadow and could be polyps or stones. Gallbladder wall 2 mm. Mild pericholecystic fluid. Negative sonographic Murphy sign Common bile duct: Diameter: Mildly prominent 8 mm Liver: Echogenic liver difficult to image without focal abnormality. Portal vein is patent on color Doppler imaging with normal direction of blood flow towards the liver. IMPRESSION: Small polyps versus adherent gallstones. Pericholecystic fluid. Negative sonographic Murphy sign. Common bile duct mildly dilated 8 mm. Fatty liver Electronically Signed   By: Franchot Gallo M.D.   On: 10/02/2017 13:05      Assessment/Plan DM HTN GERD AKI - Cr 3.4, it was 1.08 on 02/08/2016. Continue IVF and monitor I&Os closely  Abdominal pain, nausea, vomiting, diarrhea Elevated LFTs - 3 days of diffuse abdominal pain, nausea, vomiting, diarrhea - RUQ u/s showed pericholecystic fluid, negative Murphy sign, and a mildly dilated common bile duct at 72m - WBC 10.4, lipase 30, AST 89, ALT 259, alk phos 73, total bilirubin 1.0 - MRCP pending  ID - zosyn/vancomycin 1/16>> VTE - SCDs FEN - IVF, NPO  Plan - Patient with 3 days of abdominal pain, nausea, vomiting, diarrhea, and fever. He was found to have elevated LFTs, a normal WBC, and on u/s pericholecystic fluid and a mildly dilated common bile duct. Concern for ascending cholangitis, acute cholecystitis, or enteritis.  Admit to medicine. Continue IVF, NPO, antiemetics, pain control, and broad spectrum antibiotics. MRCP is pending.  Recommend repeat labs in AM. Will continue to follow.  [facility] 10/02/2017, 4:04 PM Pager: [phone]: [phone]-Fri 7:00 am-4:30 pm Sat-Sun 7:00 am-11:30 am

## 2017-10-02 NOTE — ED Provider Notes (Signed)
[facility] EMERGENCY DEPARTMENT Provider Note   CSN: [id number] Arrival date & time: 10/02/17  9937     History   Chief Complaint Chief Complaint  Patient presents with  . Abdominal Pain    HPI Timothy Chapman is a 72 y.o. male.  HPI 72 year old Caucasian male past medical history significant for hypertension, diabetes, depression, GERD presents to the emergency department today with complaints of diffuse abdominal pain, vomiting and diarrhea.  The patient states that his symptoms started approximately 3 days ago.  Patient reports a 6-7 episodes of vomiting and diarrhea over the past 24 hours.  Patient denies any blood in the stool.  Denies any blood in his vomit.  Patient reports fever with T-max of 101.3.  Patient not taking for symptoms prior to arrival.  Nothing makes better or worse.  Pain not associated with food.  Patient denies any associated urinary symptoms.  States that he did have similar episodes 1 year ago while he was in the [place] that was likely related to food poisoning.  States that he denies any p.o. intake for the past 3 days.  Patient also states that he has not been able to take his diabetes and hypertensive medications as well.  Patient describes the pain as sharp and cramping in nature.  The pain is constant.  Pt denies any fever, chill, ha, vision changes, lightheadedness, dizziness, congestion, neck pain, cp, sob, cough, urinary symptoms,  melena, hematochezia, lower extremity paresthesias.  Past Medical History:  Diagnosis Date  . Anxiety   . Arthritis   . Depression   . Diabetes mellitus without complication ([place])   . GERD (gastroesophageal reflux disease)   . Hypertension   . Neuromuscular disorder ([place])   . Sleep apnea     Patient Active Problem List   Diagnosis Date Noted  . Chronic back pain 02/08/2016  . Controlled type 2 diabetes mellitus with diabetic neuropathy, without long-term current use of insulin ([place]) 02/08/2016  .  Insomnia 02/08/2016  . History of diverticulitis 02/08/2016  . HYPERTENSION, BENIGN ESSENTIAL, UNCONTROLLED 03/12/2009  . ABNORMAL ELECTROCARDIOGRAM 03/12/2009    Past Surgical History:  Procedure Laterality Date  . BACK SURGERY     X2  . REPLACEMENT TOTAL KNEE Left   . ROTATOR CUFF REPAIR         Home Medications    Prior to Admission medications   Medication Sig Start Date End Date Taking? Authorizing Provider  Acetylcarnitine HCl 500 MG CAPS Take 500 mg by mouth daily.    [provider]  Alcohol Swabs (ALCOHOL PREP) PADS Use prior to insulin dose 08/22/17   Copland, Gay Filler, MD  Alpha-Lipoic Acid 600 MG CAPS Take 600 mg by mouth daily.    [provider]  benazepril (LOTENSIN) 20 MG tablet Take 1 tablet (20 mg total) by mouth daily. 07/08/17   Copland, Gay Filler, MD  diclofenac (VOLTAREN) 75 MG EC tablet Take 1 tablet (75 mg total) by mouth 2 (two) times daily. Use as needed for joint pains 07/19/17   Copland, Gay Filler, MD  glipiZIDE (GLUCOTROL) 10 MG tablet Take 1 tablet (10 mg total) by mouth 2 (two) times daily before a meal. 07/08/17   Copland, Gay Filler, MD  glucose blood test strip Use as instructed 06/28/16   Copland, Gay Filler, MD  hydrochlorothiazide (HYDRODIURIL) 25 MG tablet Take 0.5 tablets (12.5 mg total) by mouth daily. 07/08/17   Copland, Gay Filler, MD  Insulin Pen Needle (PEN NEEDLES) 31G  X 5 MM MISC 1 each by Does not apply route daily. 08/22/17   Copland, Gay Filler, MD  liraglutide (VICTOZA) 18 MG/3ML SOPN Inject 1.8 mg [place] daily 07/08/17   Copland, Gay Filler, MD  Melatonin 3 MG TABS Take by mouth.    [provider]  metFORMIN (GLUCOPHAGE) 1000 MG tablet Take 1 tablet (1,000 mg total) by mouth 2 (two) times daily with a meal. 07/08/17   Copland, Gay Filler, MD  Multiple Vitamin (MULTIVITAMIN) tablet Take 1 tablet by mouth daily.    [provider]  niacin (NIASPAN) 500 MG CR tablet Take 1 tablet (500 mg total) by mouth at  bedtime. 02/08/16   Copland, Gay Filler, MD  omeprazole (PRILOSEC) 40 MG capsule Take 1 capsule (40 mg total) by mouth daily. 07/08/17   Copland, Gay Filler, MD  tadalafil (CIALIS) 5 MG tablet Take 1 tablet (5 mg total) daily as needed by mouth for erectile dysfunction (and BPH). 07/24/17   Copland, Gay Filler, MD  Turmeric 450 MG CAPS Take by mouth.    [provider]    Family History Family History  Problem Relation Age of Onset  . Heart failure Father   . Heart disease Father   . Emphysema Mother     Social History Social History   Tobacco Use  . Smoking status: Former Smoker    Last attempt to quit: 09/18/1975    Years since quitting: 42.0  . Smokeless tobacco: Never Used  Substance Use Topics  . Alcohol use: Yes    Alcohol/week: 0.0 oz    Comment: Rarely  . Drug use: Not on file     Allergies   Codeine   Review of Systems Review of Systems  Constitutional: Positive for activity change, appetite change, chills and fever.  HENT: Negative for congestion and sore throat.   Eyes: Negative for visual disturbance.  Respiratory: Negative for cough and shortness of breath.   Cardiovascular: Negative for chest pain.  Gastrointestinal: Positive for abdominal pain, diarrhea, nausea and vomiting.  Genitourinary: Negative for dysuria, flank pain, frequency, hematuria, scrotal swelling, testicular pain and urgency.  Musculoskeletal: Negative for arthralgias and myalgias.  Skin: Negative for rash.  Neurological: Negative for dizziness, syncope, weakness, light-headedness, numbness and headaches.  Psychiatric/Behavioral: Negative for sleep disturbance. The patient is not nervous/anxious.      Physical Exam Updated Vital Signs BP 107/68 (BP Location: Left Arm)   Pulse (!) 107   Temp 98.7 F (37.1 C) (Oral)   Resp 20   Ht 5\' 9"  (1.753 m)   Wt 104.3 kg (230 lb)   SpO2 100%   BMI 33.97 kg/m   Physical Exam  Constitutional: He is oriented to person, place, and time.  He appears well-developed and well-nourished.  Non-toxic appearance. No distress.  HENT:  Head: Normocephalic and atraumatic.  Mouth/Throat: Oropharynx is clear and moist.  Mucous membranes are dry  Eyes: Conjunctivae are normal. Pupils are equal, round, and reactive to light. Right eye exhibits no discharge. Left eye exhibits no discharge.  Neck: Normal range of motion. Neck supple.  Cardiovascular: Normal rate, regular rhythm, normal heart sounds and intact distal pulses. Exam reveals no gallop and no friction rub.  No murmur heard. Pulmonary/Chest: Effort normal and breath sounds normal. No respiratory distress. He exhibits no tenderness.  Abdominal: Soft. He exhibits no distension. Bowel sounds are increased. There is generalized tenderness. There is guarding. There is no rigidity, no rebound, no CVA tenderness, no tenderness at McBurney's point  and negative Murphy's sign.  Musculoskeletal: Normal range of motion. He exhibits no tenderness.  Lymphadenopathy:    He has no cervical adenopathy.  Neurological: He is alert and oriented to person, place, and time.  Skin: Skin is warm and dry. Capillary refill takes less than 2 seconds. No rash noted.  Psychiatric: His behavior is normal. Judgment and thought content normal.  Nursing note and vitals reviewed.    ED Treatments / Results  Labs (all labs ordered are listed, but only abnormal results are displayed) Labs Reviewed  COMPREHENSIVE METABOLIC PANEL  LIPASE, BLOOD  CBC WITH DIFFERENTIAL/PLATELET  URINALYSIS, ROUTINE W REFLEX MICROSCOPIC    EKG  EKG Interpretation None       Radiology No results found.  Procedures Procedures (including critical care time)  Medications Ordered in ED Medications  ondansetron (ZOFRAN) injection 4 mg (not administered)  morphine 4 MG/ML injection 4 mg (not administered)     Initial Impression / Assessment and Plan / ED Course  I have reviewed the triage vital signs and the nursing  notes.  Pertinent labs & imaging results that were available during my care of the patient were reviewed by me and considered in my medical decision making (see chart for details).  Clinical Course as of Oct 02 1206  Wed Oct 02, 2017  1100 X-rays show signs of possible small bowel obstruction versus ileus.  Will consult general surgery.  Patient is not actively vomiting at this time. DG Abdomen Acute W/Chest [KL]  1101 Creatinine is 3.42 up from baseline of 1.  Patient reports ability to urinate this morning.  Creatinine: (!) 3.42 [KL]  1102 Elevated liver enzymes with without history of same.  Will obtain ultrasound right upper quadrant to assess for liver pathology versus obstructing stone.  Hepatitis panel was ordered. ALT: (!) 259 [KL]    Clinical Course User Index [KL] Doristine Devoid, PA-C    Patient presents to the ED with complaints of generalized abdominal pain, nausea, vomiting, diarrhea.  Patient also noted to have a fever at home.  Is any blood in his stool.  No history of same.  Prior colonoscopy 6 years ago that was unremarkable.  She is unsure if he is passing gas.  Last normal bowel movement was 3 days ago.  Reports lots of belching.  On exam patient is nontoxic or septic appearing.  He is febrile with some tachycardia noted.  His blood pressures are soft with systolics in the low 053Z to upper 90s.  Satting at 100% on room air.  Patient has decreased bowel sounds on exam.  There is no distention.  No peritonitis noted.  No focal abdominal tenderness.  Lungs clear to auscultation bilaterally.  Tachycardia noted on exam.  Lab work reveals no leukocytosis.  Patient does have an elevated creatinine at 3.42 up from baseline of 1.  He also has noted elevation in his liver enzymes.  Bilirubin is normal..  Is within normal limits.  Her glycemia noted with corrected sodium that is normal.  Hemoglobin appears at patient's baseline.  Hepatitis panel is pending.  UA is pending.   Influenza is pending.  Unable to obtain CT scan in the ED today.  I did perform an x-ray that shows concern for small bowel obstruction.  Given the elevated liver enzymes ultrasound was performed.  These results are pending at this time.  I spoke with PA Creig Hines with general surgery who will see patient in consultation.  Recommends hospital admission.  Spoke with  Dr. Denton Brick with hospital medicine who agrees to admission will place admission orders.  Patient will be sent to [facility] by EMS.  Patient remains hemodynamically stable with maintaining stable blood pressure at this time.  He has been treated with 2 and half liters of fluid.  Patient's fever was treated with Tylenol.  Pt seen and eval by my attending who agrees with the above plan.   1345: Patient has decompensated while in the ED.  Patient's blood pressures are failing to respond to IV fluids.  They are married remaining stable however in the low to upper 41U systolic.  Ultrasound returned that shows some peri-cholecystic fluid with probable stone with a mildly dilated common bile duct.  Patient had a repeat lactic acid given the blood pressures.  His lactic acid is now 2.48.  Patient was treated with a 30 cc/kg fluid bolus.  Broad-spectrum antibiotics were started.  Patient did not have blood cultures prior to antibiotic initiation.  Patient has no signs of peritonitis on exam.  Unknown etiology of patient's symptoms.  Differential diagnosis includes cholecystitis, obstructing/infected renal stone, small bowel obstruction.  Patient needs a CT scan however unable to provide this at this time.  Given that patient is not improving and decompensating in the ED awaiting bed at this time.  I spoke back with the hospitalist to order stepdown bed for patient.  I did speak with Dr. Venora Maples at [facility] ED for ED ED transfer to obtain CT scan given that we cannot obtain a CT scan in the ED here at [facility] today.  General surgery is still on  board patient and will evaluate patient once patient is transferred.  Patient's care has been discussed with my attending who is also evaluated patient and agrees with this plan.  Patient is transferred by EMS to [facility] ED for CT scan and admission.  Patient has a bed placement pending at this time.  Final Clinical Impressions(s) / ED Diagnoses   Final diagnoses:  Abdominal pain  Small bowel obstruction (HCC)  Nausea vomiting and diarrhea    ED Discharge Orders    None       Doristine Devoid, PA-C 10/02/17 1214    Doristine Devoid, PA-C 10/02/17 1403    [place], Fredia Sorrow, MD 10/04/17 1707

## 2017-10-02 NOTE — Telephone Encounter (Signed)
  Reason for Disposition . [1] SEVERE vomiting (e.g., 6 or more times/day) AND [2] present > 8 hours  Protocols used: VOMITING-A-AH

## 2017-10-02 NOTE — ED Notes (Signed)
CALL TO sONYA rn  Unable to take report at this time

## 2017-10-02 NOTE — ED Notes (Signed)
Pt can go off monitor for MRI, then needs to be on cardiac monitor

## 2017-10-02 NOTE — ED Notes (Signed)
Critical Lactic Acid 2.48 reported to Dr. Thomasene Lot and Eugene Garnet, [place].

## 2017-10-02 NOTE — ED Notes (Signed)
BP falling to 86/52 500 ml bolus begun

## 2017-10-02 NOTE — ED Notes (Signed)
Patient to [facility] ED[facility] accepting.  Report called to Patty, CN

## 2017-10-02 NOTE — ED Provider Notes (Signed)
2:46 PM Arrived to [facility] ER. RUQ focused tenderness. Dilated CBD and radiographic signs concerning for acute cholecystitis as pt has pericholecysitic fluid.  Zosyn given prior to transfer. hospitalist admission and GI consultation. May benefit from ERCP.   I personally reviewed the imaging tests through PACS system I reviewed available ER/hospitalization records through the EMR   Dg Abdomen Acute W/chest IMPRESSION: 1. Bowel-gas pattern suggesting a small bowel obstruction or small bowel ileus. No free air. 2.  No acute cardiopulmonary abnormality.    [place] RUQ: IMPRESSION: Small polyps versus adherent gallstones. Pericholecystic fluid. Negative sonographic Murphy sign. Common bile duct mildly dilated 8 mm. Fatty liver      ALT  Date Value Ref Range Status  10/02/2017 259 (H) 17 - 63 U/L Final  07/09/2017 43 0 - 53 U/L Final  02/08/2016 41 0 - 53 U/L Final  04/11/2007 31  Final    AST  Date Value Ref Range Status  10/02/2017 89 (H) 15 - 41 U/L Final  07/09/2017 27 0 - 37 U/L Final  02/08/2016 27 0 - 37 U/L Final  04/11/2007 28  Final      Jola Schmidt, MD 10/02/17 1449

## 2017-10-02 NOTE — Progress Notes (Signed)
Patient gives permission to ask questions on nursing admission history in front of his daughter and wife.  Lucius Conn BSN, RN-BC Admissions RN 10/02/2017 4:04 PM

## 2017-10-02 NOTE — ED Notes (Signed)
ED Provider at bedside. 

## 2017-10-03 ENCOUNTER — Other Ambulatory Visit (HOSPITAL_COMMUNITY): Payer: Medicare HMO

## 2017-10-03 ENCOUNTER — Inpatient Hospital Stay (HOSPITAL_COMMUNITY): Payer: Medicare HMO

## 2017-10-03 ENCOUNTER — Ambulatory Visit (HOSPITAL_COMMUNITY)
Admit: 2017-10-03 | Discharge: 2017-10-03 | Disposition: A | Discharge status: Home / Self Care | Payer: Medicare HMO | Attending: Family Medicine | Admitting: Family Medicine

## 2017-10-03 DIAGNOSIS — R933 Abnormal findings on diagnostic imaging of other parts of digestive tract: Secondary | ICD-10-CM

## 2017-10-03 DIAGNOSIS — I10 Essential (primary) hypertension: Secondary | ICD-10-CM

## 2017-10-03 DIAGNOSIS — R112 Nausea with vomiting, unspecified: Secondary | ICD-10-CM

## 2017-10-03 DIAGNOSIS — R509 Fever, unspecified: Secondary | ICD-10-CM

## 2017-10-03 DIAGNOSIS — K8020 Calculus of gallbladder without cholecystitis without obstruction: Secondary | ICD-10-CM | POA: Diagnosis not present

## 2017-10-03 DIAGNOSIS — R945 Abnormal results of liver function studies: Secondary | ICD-10-CM

## 2017-10-03 DIAGNOSIS — R9389 Abnormal findings on diagnostic imaging of other specified body structures: Secondary | ICD-10-CM | POA: Diagnosis not present

## 2017-10-03 DIAGNOSIS — N189 Chronic kidney disease, unspecified: Secondary | ICD-10-CM

## 2017-10-03 LAB — COMPREHENSIVE METABOLIC PANEL
ALT: 156 U/L — ABNORMAL HIGH (ref 17–63)
ANION GAP: 9 (ref 5–15)
AST: 43 U/L — ABNORMAL HIGH (ref 15–41)
Albumin: 3.2 g/dL — ABNORMAL LOW (ref 3.5–5.0)
Alkaline Phosphatase: 56 U/L (ref 38–126)
BUN: 48 mg/dL — ABNORMAL HIGH (ref 6–20)
CHLORIDE: 110 mmol/L (ref 101–111)
CO2: 16 mmol/L — AB (ref 22–32)
Calcium: 7.8 mg/dL — ABNORMAL LOW (ref 8.9–10.3)
Creatinine, Ser: 2.87 mg/dL — ABNORMAL HIGH (ref 0.61–1.24)
GFR calc non Af Amer: 21 mL/min — ABNORMAL LOW (ref >60)
GFR, EST AFRICAN AMERICAN: 24 mL/min — AB (ref >60)
Glucose, Bld: 173 mg/dL — ABNORMAL HIGH (ref 65–99)
Potassium: 4.2 mmol/L (ref 3.5–5.1)
SODIUM: 135 mmol/L (ref 135–145)
Total Bilirubin: 1.3 mg/dL — ABNORMAL HIGH (ref 0.3–1.2)
Total Protein: 6.6 g/dL (ref 6.5–8.1)

## 2017-10-03 LAB — CBC
HCT: 32.4 % — ABNORMAL LOW (ref 39.0–52.0)
HEMATOCRIT: 29.7 % — AB (ref 39.0–52.0)
Hemoglobin: 11.1 g/dL — ABNORMAL LOW (ref 13.0–17.0)
Hemoglobin: 9.9 g/dL — ABNORMAL LOW (ref 13.0–17.0)
MCH: 28.6 pg (ref 26.0–34.0)
MCH: 29.4 pg (ref 26.0–34.0)
MCHC: 33.3 g/dL (ref 30.0–36.0)
MCHC: 34.3 g/dL (ref 30.0–36.0)
MCV: 85.7 fL (ref 78.0–100.0)
MCV: 85.8 fL (ref 78.0–100.0)
PLATELETS: 192 10*3/uL (ref 150–400)
Platelets: 207 10*3/uL (ref 150–400)
RBC: 3.46 MIL/uL — ABNORMAL LOW (ref 4.22–5.81)
RBC: 3.78 MIL/uL — ABNORMAL LOW (ref 4.22–5.81)
RDW: 14 % (ref 11.5–15.5)
RDW: 14.1 % (ref 11.5–15.5)
WBC: 10.7 10*3/uL — ABNORMAL HIGH (ref 4.0–10.5)
WBC: 9.6 10*3/uL (ref 4.0–10.5)

## 2017-10-03 LAB — GLUCOSE, CAPILLARY
Glucose-Capillary: 185 mg/dL — ABNORMAL HIGH (ref 65–99)
Glucose-Capillary: 184 mg/dL — ABNORMAL HIGH (ref 65–99)
Glucose-Capillary: 151 mg/dL — ABNORMAL HIGH (ref 65–99)
Glucose-Capillary: 152 mg/dL — ABNORMAL HIGH (ref 65–99)

## 2017-10-03 LAB — HEPATITIS PANEL, ACUTE
HCV Ab: <0.1 s/co ratio (ref 0.0–0.9)
HEP A IGM: NEGATIVE
HEP B C IGM: NEGATIVE
HEP B S AG: NEGATIVE

## 2017-10-03 LAB — LIPASE, BLOOD: Lipase: 24 U/L (ref 11–51)

## 2017-10-03 MED ORDER — IOPAMIDOL (ISOVUE-300) INJECTION 61%
INTRAVENOUS | Status: AC
  Filled 2017-10-03: qty 30

## 2017-10-03 MED ORDER — IOPAMIDOL (ISOVUE-300) INJECTION 61%: 15.0000 mL | Freq: Two times a day (BID) | INTRAVENOUS | Status: DC | PRN

## 2017-10-03 MED ORDER — TECHNETIUM TC 99M MEBROFENIN IV KIT
5.5000 | PACK | Freq: Once | INTRAVENOUS | Status: AC | PRN
  Administered 2017-10-03: 5.5 via INTRAVENOUS

## 2017-10-03 MED ORDER — TRAMADOL HCL 50 MG PO TABS
50.0000 mg | ORAL_TABLET | Freq: Four times a day (QID) | ORAL | Status: DC | PRN
  Administered 2017-10-03 – 2017-10-04 (×2): 50 mg via ORAL
  Filled 2017-10-03 (×2): qty 1

## 2017-10-03 MED ORDER — ACETAMINOPHEN 500 MG PO TABS
1000.0000 mg | ORAL_TABLET | Freq: Once | ORAL | Status: AC
  Administered 2017-10-03: 1000 mg via ORAL
  Filled 2017-10-03: qty 2

## 2017-10-03 MED ORDER — LORAZEPAM 2 MG/ML IJ SOLN
1.0000 mg | INTRAMUSCULAR | Status: DC | PRN
  Administered 2017-10-03 – 2017-10-04 (×2): 2 mg via INTRAVENOUS
  Filled 2017-10-03 (×3): qty 1

## 2017-10-03 NOTE — Progress Notes (Addendum)
Attending MD note  Patient was seen, examined,treatment plan was discussed with the PA-.  I have personally reviewed the clinical findings, lab, imaging studies and management of this patient in detail. I agree with the documentation, as recorded by the PA-S.   Patient is a 72 year old male with DM-2, hypertension who presented with 2-3-day history of abdominal pain, nausea vomiting, diarrhea and fever.  He was admitted to the hospitalist service for further evaluation and treatment.   Assessment and plan: Fever along with nausea/vomiting and abdominal pain: Could be gastroenteritis/viral syndrome-but need to rule out biliary pathology given elevated LFTs.  He is slightly improved-but continues to have fever overnight, and continues to complain of diffuse abdominal pain.No Peritoneal signs.  Plans are to continue with IV fluids, antiemetics, and empiric Zosyn.  Awaiting MRCP (could not complete yesterday due to claustrophobia) and blood cultures.  Sepsis: Likely secondary to above-sepsis pathophysiology slowly resolving.  Awaiting cultures-continue with empiric Zosyn-but will go ahead and stop vancomycin.  Transaminitis: Probably a viral syndrome (although acute hepatitis panel negative)-or biliary etiology-awaiting MRCP  Acute kidney injury: Likely hemodynamically mediated in the setting of sepsis and ACE inhibitor use-slowly improving with IV fluids.  Avoid nephrotoxic agents.  DM-2: Continue to hold oral hypoglycemic agents-follow CBGs while on SSI.  Will adjust accordingly.  Hypertension: Continue to hold Lotensin-blood pressure appears to be reasonably well controlled.    On Exam: Vitals:   10/03/17 0555 10/03/17 0900 10/03/17 1048 10/03/17 1113  BP: (!) 136/58 132/74 (!) 122/109 125/60  Pulse: (!) 112 (!) 102 (!) 102   Resp: (!) 22 (!) 21 18   Temp: 99.8 F (37.7 C)  (!) 100.9 F (38.3 C) 100.3 F (37.9 C)  TempSrc: Oral  Oral Oral  SpO2: 98% 97% 98%   Weight:      Height:        Gen. exam: Awake, alert, not in any distress Chest: Good air entry bilaterally, no rhonchi or rales CVS: S1-S2 regular, no murmurs Abdomen: Soft, mild diffuse tenderness-but no peritoneal signs.  No Murphy's sign. Neurology: Non-focal Skin: No rash or lesions   Lab Data: CBC: Recent Labs  Lab 10/02/17 0948 10/03/17 0443  WBC 10.4 10.7*  NEUTROABS 8.1*  --   HGB 12.3* 11.1*  HCT 36.3* 32.4*  MCV 85.2 85.7  PLT 238 671    Basic Metabolic Panel: Recent Labs  Lab 10/02/17 0948 10/03/17 0443  NA 133* 135  K 4.3 4.2  CL 100* 110  CO2 19* 16*  GLUCOSE 222* 173*  BUN 47* 48*  CREATININE 3.42* 2.87*  CALCIUM 9.1 7.8*    GFR: Estimated Creatinine Clearance: 29.7 mL/min (A) (by C-G formula based on SCr of 2.87 mg/dL (H)).  Liver Function Tests: Recent Labs  Lab 10/02/17 0948 10/03/17 0443  AST 89* 43*  ALT 259* 156*  ALKPHOS 73 56  BILITOT 1.0 1.3*  PROT 7.5 6.6  ALBUMIN 3.7 3.2*   Recent Labs  Lab 10/02/17 0948 10/03/17 0443  LIPASE 30 24   No results for input(s): AMMONIA in the last 168 hours.  Coagulation Profile: No results for input(s): INR, PROTIME in the last 168 hours.  [facility]         PROGRESS NOTE  Caedmon Louque Gryder IWP:[id number] DOB: [birth year] DOA: 10/02/2017 PCP: [name], MD   LOS: 1 day   Brief Narrative / Interim history: 72 y.o. Male with history of DM uncontrolled, HTN, GERD, depression presented to the ED yesterday  for diffuse abdominal accompanied by nausua, vomiting, diarrhea, and fever of 102.2. Symptoms started Sunday night and has been n.p.o since then. Pain is dull and constant in RUQ, increased with pressure. In ED patient became unstable, B/P dropped, lactic acid was 2.48. Labs in ED showed elevated LFT's, normal bilirubin, normal lipase, normal alk phose. X-ray showed possible bowel obstruction.Hep panel and Influenza negative. MCRP performed, waiting on results. Today patient  appeared lethargic/weak with mild confusion, reported diarrhea x 2, no n/v.   Assessment & Plan: Principal Problem:   Abdominal pain Active Problems:   HYPERTENSION, BENIGN ESSENTIAL, UNCONTROLLED  Abdominal pain,N/V/D/  -  New onset N,v,d since Sunday with diffuse abdominal pain, persistent. -possible bilary etiology. Elevated LFTs on admission, trending down. AST-43 and ALT-156. Lipase and alk phos normal. WBC normal. Hep panel negative.  - RUQ [place] with no significant findings, negative Murphy's sign - MCRP performed, awaiting reuslts  Fever -102.2 on admission, 99.8 today with some tachycardia.  - Lactic acid yesterday elevated at 2.39, but improved since receiving zosyn in ED. Today levels normal 1.33 - WBC 10.7 unchanged since admission - possible gasteroenteritis  DM, uncontrolled -  07/09/17 the A1C was 8.9.  - continue to hold glipizide, victoza, and metformin -Novology/humalog sliding scale. Continue to monitor   AKI - Creatinine 3.42 upon admission. Likely due to dehydration, n/v/d. Creatin today 2.87. Continue IVF -continue to hold HCTZ and metformin      DVT prophylaxis: SCD Code Status: Full Family Communication:wife and daughter at bedside Disposition Plan: home  Consultants:     Procedures:   MRCP  Antimicrobials:  zosyn  Subjective: Patient appears lethargic with obvious discomfort on movement. Some confusion while answering questions. He says he is feeling a little better today.  Objective: Vitals:   10/03/17 0018 10/03/17 0112 10/03/17 0555 10/03/17 0900  BP:   (!) 136/58 132/74  Pulse:   (!) 112 (!) 102  Resp:   (!) 22 (!) 21  Temp: (!) 103 F (39.4 C) (!) 100.7 F (38.2 C) 99.8 F (37.7 C)   TempSrc: Oral Oral Oral   SpO2:   98% 97%  Weight:      Height:        Intake/Output Summary (Last 24 hours) at 10/03/2017 1024 Last data filed at 10/02/2017 2200 Gross per 24 hour  Intake 4728.63 ml  Output -  Net 4728.63 ml   Filed  Weights   10/02/17 0936 10/02/17 2206  Weight: 104.3 kg (230 lb) 110.3 kg (243 lb 3.2 oz)    Examination:  Constitutional: Generalized weakness, fatigue Eyes: PERRL, lids and conjunctivae normal ENMT: Mucous membranes are moist. No oropharyngeal exudates Neck: normal, supple, no LAD Respiratory: clear to auscultation bilaterally, no wheezing, no crackles. Normal respiratory effort. No accessory muscle use.  Cardiovascular: Regular rate and rhythm, no murmurs / rubs / gallops. No LE edema. 2+ pedal pulses.  Abdomen:  Tenderness RUQ.  -Murphy's Musculoskeletal: No joint deformity upper and lower extremities. No contractures. Normal muscle tone.  Skin: no rashes, lesions, ulcers. No induration Neurologic: CN 2-12 grossly intact. Strength 5/5 in all 4.  Psychiatric: Normal judgment and insight. minimal confusion  Data Reviewed: I have independently reviewed following labs and imaging studies   CBC: Recent Labs  Lab 10/02/17 0948 10/03/17 0443  WBC 10.4 10.7*  NEUTROABS 8.1*  --   HGB 12.3* 11.1*  HCT 36.3* 32.4*  MCV 85.2 85.7  PLT 238 027   Basic Metabolic Panel: Recent Labs  Lab  10/02/17 0948 10/03/17 0443  NA 133* 135  K 4.3 4.2  CL 100* 110  CO2 19* 16*  GLUCOSE 222* 173*  BUN 47* 48*  CREATININE 3.42* 2.87*  CALCIUM 9.1 7.8*   GFR: Estimated Creatinine Clearance: 29.7 mL/min (A) (by C-G formula based on SCr of 2.87 mg/dL (H)). Liver Function Tests: Recent Labs  Lab 10/02/17 0948 10/03/17 0443  AST 89* 43*  ALT 259* 156*  ALKPHOS 73 56  BILITOT 1.0 1.3*  PROT 7.5 6.6  ALBUMIN 3.7 3.2*   Recent Labs  Lab 10/02/17 0948 10/03/17 0443  LIPASE 30 24   No results for input(s): AMMONIA in the last 168 hours. Coagulation Profile: No results for input(s): INR, PROTIME in the last 168 hours. Cardiac Enzymes: No results for input(s): CKTOTAL, CKMB, CKMBINDEX, TROPONINI in the last 168 hours. BNP (last 3 results) No results for input(s): PROBNP in the last  8760 hours. HbA1C: No results for input(s): HGBA1C in the last 72 hours. CBG: Recent Labs  Lab 10/03/17 0746  GLUCAP 185*   Lipid Profile: No results for input(s): CHOL, HDL, LDLCALC, TRIG, CHOLHDL, LDLDIRECT in the last 72 hours. Thyroid Function Tests: No results for input(s): TSH, T4TOTAL, FREET4, T3FREE, THYROIDAB in the last 72 hours. Anemia Panel: No results for input(s): VITAMINB12, FOLATE, FERRITIN, TIBC, IRON, RETICCTPCT in the last 72 hours. Urine analysis:    Component Value Date/Time   COLORURINE YELLOW 04/11/2007 [place] 04/11/2007 0917   LABSPEC 1.030 04/11/2007 0917   PHURINE 5.0 04/11/2007 0917   GLUCOSEU NEGATIVE 04/11/2007 0917   HGBUR NEGATIVE 04/11/2007 0917   BILIRUBINUR SMALL (A) 04/11/2007 0917   KETONESUR 15 (A) 04/11/2007 0917   PROTEINUR NEGATIVE 04/11/2007 0917   UROBILINOGEN 0.2 04/11/2007 0917   NITRITE NEGATIVE 04/11/2007 0917   LEUKOCYTESUR  04/11/2007 0917    NEGATIVE MICROSCOPIC NOT DONE ON URINES WITH NEGATIVE PROTEIN, BLOOD, LEUKOCYTES, NITRITE, OR GLUCOSE <1000 mg/dL.   Sepsis Labs: Invalid input(s): PROCALCITONIN, LACTICIDVEN  No results found for this or any previous visit (from the past 240 hour(s)).    Radiology Studies: Dg Abdomen Acute W/chest  Result Date: 10/02/2017 CLINICAL DATA:  72 year old male with abdominal pain, distention, vomiting, diarrhea. Symptoms for 3 days. EXAM: DG ABDOMEN ACUTE W/ 1V CHEST COMPARISON:  Chest radiograph 04/11/2007. Lumbar radiographs 02/11/2014. FINDINGS: Lung volumes are stable and within normal limits. Normal cardiac size and mediastinal contours. Visualized tracheal air column is within normal limits. No pneumothorax or pneumoperitoneum. No abnormal pulmonary opacity. Multiple upper limits of normal to abnormally dilated gas containing small bowel loops in the mid abdomen, up to 4.5-5 centimeters diameter. Paucity of large bowel gas. Other abdominal and pelvic visceral contours  appear normal. There are surgical clips about the pubic symphysis which are new since 2,015. Degenerative changes in the spine. No acute osseous abnormality identified. IMPRESSION: 1. Bowel-gas pattern suggesting a small bowel obstruction or small bowel ileus. No free air. 2.  No acute cardiopulmonary abnormality. Electronically Signed   By: Genevie Ann M.D.   On: 10/02/2017 10:18   US Abdomen Limited Ruq  Result Date: 10/02/2017 CLINICAL DATA:  Abdominal pain EXAM: ULTRASOUND ABDOMEN LIMITED RIGHT UPPER QUADRANT COMPARISON:  None. FINDINGS: Gallbladder: 2 small echogenic foci in the gallbladder do not move or shadow and could be polyps or stones. Gallbladder wall 2 mm. Mild pericholecystic fluid. Negative sonographic Murphy sign Common bile duct: Diameter: Mildly prominent 8 mm Liver: Echogenic liver difficult to image without focal abnormality. Portal  vein is patent on color Doppler imaging with normal direction of blood flow towards the liver. IMPRESSION: Small polyps versus adherent gallstones. Pericholecystic fluid. Negative sonographic Murphy sign. Common bile duct mildly dilated 8 mm. Fatty liver Electronically Signed   By: Franchot Gallo M.D.   On: 10/02/2017 13:05     Scheduled Meds: . acetaminophen  1,000 mg Oral Once  . heparin  5,000 Units Subcutaneous Q8H  . insulin aspart  0-9 Units Subcutaneous TID WC  . multivitamin with minerals  1 tablet Oral Daily  . pantoprazole  40 mg Oral BID  . senna  1 tablet Oral BID  . sodium chloride flush  3 mL Intravenous Q12H   Continuous Infusions: . sodium chloride    . sodium chloride 200 mL/hr at 10/02/17 2231  . piperacillin-tazobactam (ZOSYN)  IV 3.375 g (10/03/17 0627)  . sodium chloride 0.9 % 1,000 mL infusion 999 mL/hr at 10/02/17 2038    Time spent: 25 minutes  Cadence Kathlen Mody, PA-S

## 2017-10-03 NOTE — Consult Note (Signed)
Referring Provider:  Dr. Venora Maples Primary Care Physician:  Lorelei Pont Gay Filler, MD Primary Gastroenterologist:  None, unassigned  Reason for Consultation:  ? CBD stone, elevated LFT's  HPI: Timothy Chapman is a 72 y.o. male with past medical history significant for obesity, diabetes, hypertension, and GERD who presented to the ED on 1/16 with concerns about fever, chills, abdominal pain, and nausea with emesis for the last 3 days prior to presentation.   In ED Elevated AST at 89 and ALT at 259 with normal total bili and ALP noted.  WBC 10.4, lipase 30.  RUQ ultrasound shows gallstones vs polyps and pericholecystic fluid, CBD 8 mm.  In ED--- abdominal x-rays suggest possible SBO vs ileus.  Changes noted to have elevated creatinine of 3.4, previously renal function was normal with a creatinine of 1.1.  MRCP was ordered, but apparently the patient has refused twice due to claustrophobia and being uncomfortable.  He is going to be transferred to [facility] for open MRI later today.  Viral hepatitis studies are negative.  He's spiked fever to 103 overnight.  Is on Zosyn.  AST is down to 43 and ALT down to 156.  ALP remains normal, but total bili trending up slightly to 1.3.  Past Medical History:  Diagnosis Date  . Anxiety   . Arthritis   . Depression   . Diabetes mellitus without complication ([place])   . GERD (gastroesophageal reflux disease)   . Hypertension   . Neuromuscular disorder ([place])   . Sleep apnea     Past Surgical History:  Procedure Laterality Date  . BACK SURGERY     X2  . REPLACEMENT TOTAL KNEE Left   . ROTATOR CUFF REPAIR      Prior to Admission medications   Medication Sig Start Date End Date Taking? Authorizing Provider  Alpha-Lipoic Acid 600 MG CAPS Take 600 mg by mouth daily.   Yes [provider]  benazepril (LOTENSIN) 20 MG tablet Take 1 tablet (20 mg total) by mouth daily. 07/08/17  Yes Copland, Gay Filler, MD  diclofenac (VOLTAREN) 75 MG EC tablet  Take 1 tablet (75 mg total) by mouth 2 (two) times daily. Use as needed for joint pains 07/19/17  Yes Copland, Gay Filler, MD  glipiZIDE (GLUCOTROL) 10 MG tablet Take 1 tablet (10 mg total) by mouth 2 (two) times daily before a meal. 07/08/17  Yes Copland, Gay Filler, MD  hydrochlorothiazide (HYDRODIURIL) 25 MG tablet Take 0.5 tablets (12.5 mg total) by mouth daily. 07/08/17  Yes Copland, Gay Filler, MD  Melatonin 3 MG TABS Take 1 tablet by mouth at bedtime.    Yes [provider]  Multiple Vitamin (MULTIVITAMIN) tablet Take 1 tablet by mouth daily.   Yes [provider]  Turmeric 450 MG CAPS Take 1 tablet by mouth daily.    Yes [provider]  Acetylcarnitine HCl 500 MG CAPS Take 500 mg by mouth daily.    [provider]  Alcohol Swabs (ALCOHOL PREP) PADS Use prior to insulin dose 08/22/17   Copland, Jessica C, MD  glucose blood test strip Use as instructed 06/28/16   Copland, Gay Filler, MD  Insulin Pen Needle (PEN NEEDLES) 31G X 5 MM MISC 1 each by Does not apply route daily. 08/22/17   Copland, Gay Filler, MD  liraglutide (VICTOZA) 18 MG/3ML SOPN Inject 1.8 mg [place] daily 07/08/17   Copland, Gay Filler, MD  metFORMIN (GLUCOPHAGE) 1000 MG tablet Take 1 tablet (1,000 mg total) by mouth 2 (  two) times daily with a meal. 07/08/17   Copland, Gay Filler, MD  niacin (NIASPAN) 500 MG CR tablet Take 1 tablet (500 mg total) by mouth at bedtime. Patient not taking: Reported on 10/02/2017 02/08/16   Copland, Gay Filler, MD  omeprazole (PRILOSEC) 40 MG capsule Take 1 capsule (40 mg total) by mouth daily. Patient taking differently: Take 40 mg by mouth daily as needed (heartburn).  07/08/17   Copland, Gay Filler, MD  tadalafil (CIALIS) 5 MG tablet Take 1 tablet (5 mg total) daily as needed by mouth for erectile dysfunction (and BPH). 07/24/17   Copland, Gay Filler, MD    Current Facility-Administered Medications  Medication Dose Route Frequency Provider Last Rate Last Dose  . 0.9 %   sodium chloride infusion  250 mL Intravenous PRN Emokpae, Courage, MD      . 0.9 %  sodium chloride infusion   Intravenous Continuous Emokpae, Courage, MD 200 mL/hr at 10/03/17 1111    . acetaminophen (TYLENOL) tablet 650 mg  650 mg Oral Q6H PRN Roxan Hockey, MD   650 mg at 10/03/17 0017   Or  . acetaminophen (TYLENOL) suppository 650 mg  650 mg Rectal Q6H PRN Emokpae, Courage, MD      . albuterol (PROVENTIL) (2.5 MG/3ML) 0.083% nebulizer solution 2.5 mg  2.5 mg Nebulization Q2H PRN Emokpae, Courage, MD      . gadobenate dimeglumine (MULTIHANCE) injection 20 mL  20 mL Intravenous Once PRN Emokpae, Courage, MD      . heparin injection 5,000 Units  5,000 Units Subcutaneous Q8H Roxan Hockey, MD   5,000 Units at 10/03/17 7902  . hydrALAZINE (APRESOLINE) injection 10 mg  10 mg Intravenous Q6H PRN Emokpae, Courage, MD      . insulin aspart (novoLOG) injection 0-9 Units  0-9 Units Subcutaneous TID WC Roxan Hockey, MD   2 Units at 10/03/17 0846  . iopamidol (ISOVUE-300) 61 % injection 15 mL  15 mL Oral BID PRN Meuth, Brooke A, PA-C      . iopamidol (ISOVUE-300) 61 % injection           . LORazepam (ATIVAN) injection 1-2 mg  1-2 mg Intravenous Q4H PRN Meuth, Brooke A, PA-C      . multivitamin with minerals tablet 1 tablet  1 tablet Oral Daily Emokpae, Courage, MD      . ondansetron (ZOFRAN) tablet 4 mg  4 mg Oral Q6H PRN Emokpae, Courage, MD       Or  . ondansetron (ZOFRAN) injection 4 mg  4 mg Intravenous Q6H PRN Emokpae, Courage, MD      . pantoprazole (PROTONIX) EC tablet 40 mg  40 mg Oral BID Emokpae, Courage, MD      . piperacillin-tazobactam (ZOSYN) IVPB 3.375 g  3.375 g Intravenous Q8H Wynell Balloon, RPH 12.5 mL/hr at 10/03/17 0627 3.375 g at 10/03/17 0627  . polyethylene glycol (MIRALAX / GLYCOLAX) packet 17 g  17 g Oral Daily PRN Emokpae, Courage, MD      . senna (SENOKOT) tablet 8.6 mg  1 tablet Oral BID Emokpae, Courage, MD      . sodium chloride 0.9 % 1,000 mL infusion    Intravenous Continuous Roxan Hockey, MD 999 mL/hr at 10/02/17 2038    . sodium chloride flush (NS) 0.9 % injection 3 mL  3 mL Intravenous Q12H Emokpae, Courage, MD   3 mL at 10/02/17 2235  . sodium chloride flush (NS) 0.9 % injection 3 mL  3 mL Intravenous PRN Emokpae,  Courage, MD      . traZODone (DESYREL) tablet 50 mg  50 mg Oral QHS PRN Roxan Hockey, MD        Allergies as of 10/02/2017 - Review Complete 10/02/2017  Allergen Reaction Noted  . Codeine  03/12/2009    Family History  Problem Relation Age of Onset  . Heart failure Father   . Heart disease Father   . Emphysema Mother     Social History   Socioeconomic History  . Marital status: Divorced    Spouse name: Not on file  . Number of children: Not on file  . Years of education: Not on file  . Highest education level: Not on file  Social Needs  . Financial resource strain: Not on file  . Food insecurity - worry: Not on file  . Food insecurity - inability: Not on file  . Transportation needs - medical: Not on file  . Transportation needs - non-medical: Not on file  Occupational History  . Not on file  Tobacco Use  . Smoking status: Former Smoker    Last attempt to quit: 09/18/1975    Years since quitting: 42.0  . Smokeless tobacco: Never Used  Substance and Sexual Activity  . Alcohol use: Yes    Alcohol/week: 0.0 oz    Comment: Rarely  . Drug use: Not on file  . Sexual activity: Not on file  Other Topics Concern  . Not on file  Social History Narrative  . Not on file    Review of Systems: ROS is O/W negative except as mentioned in HPI.  Physical Exam: Vital signs in last 24 hours: Temp:  [98.6 F (37 C)-103 F (39.4 C)] 100.3 F (37.9 C) (01/17 1113) Pulse Rate:  [94-114] 102 (01/17 1048) Resp:  [16-32] 18 (01/17 1048) BP: (90-136)/(40-109) 125/60 (01/17 1113) SpO2:  [91 %-100 %] 98 % (01/17 1048) Weight:  [243 lb 3.2 oz (110.3 kg)] 243 lb 3.2 oz (110.3 kg) (01/16 2206)   General:  Alert,  Well-developed, well-nourished, cooperative in NAD Head:  Normocephalic and atraumatic. Eyes:  Sclera clear, no icterus.  Conjunctiva pink. Ears:  Normal auditory acuity. Mouth:  No deformity or lesions.   Lungs:  Clear throughout to auscultation.  No wheezes, crackles, or rhonchi.  Heart:  Slightly tachy, no murmurs. Abdomen:  Soft, non-distended.  BS present.  Moderate RUQ TTP. Rectal:  Deferred  Msk:  Symmetrical without gross deformities. Pulses:  Normal pulses noted. Extremities:  Without clubbing or edema. Neurologic:  Alert and oriented x 4;  grossly normal neurologically. Skin:  Intact without significant lesions or rashes. Psych:  Alert and cooperative. Normal mood and affect.  Intake/Output from previous day: 01/16 0701 - 01/17 0700 In: 4728.6 [I.V.:1728.6; IV Piggyback:3000] Out: -   Lab Results: Recent Labs    10/02/17 0948 10/03/17 0443  WBC 10.4 10.7*  HGB 12.3* 11.1*  HCT 36.3* 32.4*  PLT 238 192   BMET Recent Labs    10/02/17 0948 10/03/17 0443  NA 133* 135  K 4.3 4.2  CL 100* 110  CO2 19* 16*  GLUCOSE 222* 173*  BUN 47* 48*  CREATININE 3.42* 2.87*  CALCIUM 9.1 7.8*   LFT Recent Labs    10/03/17 0443  PROT 6.6  ALBUMIN 3.2*  AST 43*  ALT 156*  ALKPHOS 56  BILITOT 1.3*   Hepatitis Panel Recent Labs    10/02/17 1057  HEPBSAG Negative  HCVAB <0.1  HEPAIGM Negative  HEPBIGM Negative  Studies/Results: Dg Abdomen Acute W/chest  Result Date: 10/02/2017 CLINICAL DATA:  72 year old male with abdominal pain, distention, vomiting, diarrhea. Symptoms for 3 days. EXAM: DG ABDOMEN ACUTE W/ 1V CHEST COMPARISON:  Chest radiograph 04/11/2007. Lumbar radiographs 02/11/2014. FINDINGS: Lung volumes are stable and within normal limits. Normal cardiac size and mediastinal contours. Visualized tracheal air column is within normal limits. No pneumothorax or pneumoperitoneum. No abnormal pulmonary opacity. Multiple upper limits of normal to abnormally  dilated gas containing small bowel loops in the mid abdomen, up to 4.5-5 centimeters diameter. Paucity of large bowel gas. Other abdominal and pelvic visceral contours appear normal. There are surgical clips about the pubic symphysis which are new since 2,015. Degenerative changes in the spine. No acute osseous abnormality identified. IMPRESSION: 1. Bowel-gas pattern suggesting a small bowel obstruction or small bowel ileus. No free air. 2.  No acute cardiopulmonary abnormality. Electronically Signed   By: Genevie Ann M.D.   On: 10/02/2017 10:18   US Abdomen Limited Ruq  Result Date: 10/02/2017 CLINICAL DATA:  Abdominal pain EXAM: ULTRASOUND ABDOMEN LIMITED RIGHT UPPER QUADRANT COMPARISON:  None. FINDINGS: Gallbladder: 2 small echogenic foci in the gallbladder do not move or shadow and could be polyps or stones. Gallbladder wall 2 mm. Mild pericholecystic fluid. Negative sonographic Murphy sign Common bile duct: Diameter: Mildly prominent 8 mm Liver: Echogenic liver difficult to image without focal abnormality. Portal vein is patent on color Doppler imaging with normal direction of blood flow towards the liver. IMPRESSION: Small polyps versus adherent gallstones. Pericholecystic fluid. Negative sonographic Murphy sign. Common bile duct mildly dilated 8 mm. Fatty liver Electronically Signed   By: Franchot Gallo M.D.   On: 10/02/2017 13:05   IMPRESSION:  *RUQ abdominal pain, fever, nausea, vomiting:  LFT's elevated.  Ultrasound shows gallstones vs gallbladder polyps and pericholecystic fluid.  8 mm CBD.  Viral hepatitis panel negative.  ? Cholecystitis vs cholangitis.  MRCP has been attempted twice, but patient refusing to being uncomfortable.  He is being transferred to [facility] for open MRI later today.  He is on zosyn and surgery is following as well. *AKI:  Improving.  PLAN: *Continue antibiotics. *Await results of MRCP, if positive for CBD stone then will need ERCP.  If negative for CBD stone then likely will  need cholecystectomy.  Laban Emperor. Zehr  10/03/2017, 11:18 AM  Pager number [phone]   Attending physician's note   I have taken a history, examined the patient and reviewed the chart. I agree with the Advanced Practitioner's note, impression and recommendations. 39 yr admitted with abdominal pain, fever, nausea and vomiting, abdominal ultrasound showed gallstones versus gallbladder polyp and pericholecystic fluid.  CBD 8 mm with no definitive stone He has severe right upper quadrant tenderness on exam with rebound Patient did not tolerate MRI at [facility], transferred to [facility] for MRCP as they have an open MRI machine. Has mild elevation of bilirubin 1.3 , as well as AST 43 and ALT156 Acute cholecystitis +,cannot exclude cholangitis MRCP results to evaluate CBD and exclude CBD stones  K Denzil Magnuson, MD [phone] Mon-Fri 8a-5p [phone] after 5p, weekends, holidays

## 2017-10-03 NOTE — Progress Notes (Signed)
Subjective: Says he feels a little better.  Not much. No apparent vomiting or diarrhea overnight.  MRCP not done yet.  Radiology says he refused.  He says his shoulder hurt.  We are going to try again this morning.  Radiology called.  Vital signs reveal BP 136/58.  Temp 99.8 but was higher yesterday.  Heart rate 100.  A little tachypnea kit 22.  SPO2 98%.  Lab work this morning reveals AST down to 43.  A LT down to 156.  Total bilirubin 1.3.  Lipase 24.  Creatinine down to 2.87 from 3.42.  WBC 10,700.  Hemoglobin 11.1.  CO2 16.  Objective: Vital signs in last 24 hours: Temp:  [98.6 F (37 C)-103 F (39.4 C)] 99.8 F (37.7 C) (01/17 0555) Pulse Rate:  [94-114] 112 (01/17 0555) Resp:  [16-32] 22 (01/17 0555) BP: (90-136)/(40-68) 136/58 (01/17 0555) SpO2:  [91 %-100 %] 98 % (01/17 0555) Weight:  [104.3 kg (230 lb)-110.3 kg (243 lb 3.2 oz)] 110.3 kg (243 lb 3.2 oz) (01/16 2206)    Intake/Output from previous day: 01/16 0701 - 01/17 0700 In: 4728.6 [I.V.:1728.6; IV Piggyback:3000] Out: -  Intake/Output this shift: Total I/O In: 1728.6 [I.V.:1728.6] Out: -   General appearance: Await.  Moderate distress.  Answers questions appropriately but not a very good historian. Resp: clear to auscultation bilaterally.  Mild tachypnea GI: Abdomen somewhat obese.  Somewhat distended.  Soft without guarding.  Mild subjective tenderness but no focal tenderness.  Particularly no tenderness to deep palpation in upper abdomen.  No tenderness to percuss costal margins right or left.  No mass.  Lab Results:  Recent Labs    10/02/17 0948 10/03/17 0443  WBC 10.4 10.7*  HGB 12.3* 11.1*  HCT 36.3* 32.4*  PLT 238 192   BMET Recent Labs    10/02/17 0948 10/03/17 0443  NA 133* 135  K 4.3 4.2  CL 100* 110  CO2 19* 16*  GLUCOSE 222* 173*  BUN 47* 48*  CREATININE 3.42* 2.87*  CALCIUM 9.1 7.8*   PT/INR No results for input(s): LABPROT, INR in the last 72 hours. ABG No results for  input(s): PHART, HCO3 in the last 72 hours.  Invalid input(s): PCO2, PO2  Studies/Results: Dg Abdomen Acute W/chest  Result Date: 10/02/2017 CLINICAL DATA:  72 year old male with abdominal pain, distention, vomiting, diarrhea. Symptoms for 3 days. EXAM: DG ABDOMEN ACUTE W/ 1V CHEST COMPARISON:  Chest radiograph 04/11/2007. Lumbar radiographs 02/11/2014. FINDINGS: Lung volumes are stable and within normal limits. Normal cardiac size and mediastinal contours. Visualized tracheal air column is within normal limits. No pneumothorax or pneumoperitoneum. No abnormal pulmonary opacity. Multiple upper limits of normal to abnormally dilated gas containing small bowel loops in the mid abdomen, up to 4.5-5 centimeters diameter. Paucity of large bowel gas. Other abdominal and pelvic visceral contours appear normal. There are surgical clips about the pubic symphysis which are new since 2,015. Degenerative changes in the spine. No acute osseous abnormality identified. IMPRESSION: 1. Bowel-gas pattern suggesting a small bowel obstruction or small bowel ileus. No free air. 2.  No acute cardiopulmonary abnormality. Electronically Signed   By: Genevie Ann M.D.   On: 10/02/2017 10:18   US Abdomen Limited Ruq  Result Date: 10/02/2017 CLINICAL DATA:  Abdominal pain EXAM: ULTRASOUND ABDOMEN LIMITED RIGHT UPPER QUADRANT COMPARISON:  None. FINDINGS: Gallbladder: 2 small echogenic foci in the gallbladder do not move or shadow and could be polyps or stones. Gallbladder wall 2 mm. Mild pericholecystic fluid. Negative sonographic  Murphy sign Common bile duct: Diameter: Mildly prominent 8 mm Liver: Echogenic liver difficult to image without focal abnormality. Portal vein is patent on color Doppler imaging with normal direction of blood flow towards the liver. IMPRESSION: Small polyps versus adherent gallstones. Pericholecystic fluid. Negative sonographic Murphy sign. Common bile duct mildly dilated 8 mm. Fatty liver Electronically Signed    By: Franchot Gallo M.D.   On: 10/02/2017 13:05    Anti-infectives: Anti-infectives (From admission, onward)   Start     Dose/Rate Route Frequency Ordered Stop   10/02/17 2200  piperacillin-tazobactam (ZOSYN) IVPB 3.375 g     3.375 g 12.5 mL/hr over 240 Minutes Intravenous Every 8 hours 10/02/17 1257     10/02/17 1400  vancomycin (VANCOCIN) IVPB 1000 mg/200 mL premix  Status:  Discontinued     1,000 mg 200 mL/hr over 60 Minutes Intravenous Every 24 hours 10/02/17 1257 10/02/17 1843   10/02/17 1300  piperacillin-tazobactam (ZOSYN) IVPB 3.375 g     3.375 g 100 mL/hr over 30 Minutes Intravenous  Once 10/02/17 1257 10/02/17 1352      Assessment/Plan:  Abdominal pain, fever ,nausea, vomiting, diarrhea.  Acute illness. -Imaging studies and physical exam are not classic for acute cholecystitis or cholangitis, but this needs to be considered -Other etiologies are possible, including SBO or other enteritis -Proceed with MRCP now -If this is nondiagnostic would proceed with CT scan with oral contrast... Avoid IV contrast due to abnormal creatinine  Sepsis.  Likely GI source.  Elevated creatinine.  This appears to be acute renal failure as creatinine was 1.1 on 07/09/2017.  History DM 2 History hypertension History BPH -   LOS: 1 day    Timothy Chapman 10/03/2017

## 2017-10-03 NOTE — Progress Notes (Signed)
Patient to hold off on oral contrast d/t timing of CT and scheduled [facility] pick-up for MRCP at [facility] (patient to leave here at 1230pm to go to [facility]).

## 2017-10-03 NOTE — Progress Notes (Signed)
Patient has refused/stated he preferred not to have meal coverage novolog today.  Patient has been NPO throughout the day and going for multiple procedures; d/t risk of patient CBG dropping and RN not with patient for large amounts of time today, RN did hold novolog today. Will continue to monitor.

## 2017-10-03 NOTE — Progress Notes (Signed)
Patient back from MRCP.  Patient stable at this time.  Wife at bedside.  See flowsheet for vitals.

## 2017-10-03 NOTE — Progress Notes (Signed)
Surgery:  MRCP inconclusive:  -Extensive motion artifact  -Tiny stones probable in gallbladder with trace pericholecystic fluid no wall thickening -CBD incompletely evaluated but no CBD stone seen -Pancreas probably okay  We are no closer to establishing whether he has acute cholecystitis or not. History physical exam is nonlocalizing  I have ordered stat hepatobiliary scan Repeat labs in a.m. If hepatobiliary scan normal, then would proceed with CT abdomen and pelvis with oral contrast only  If hepatobiliary scan positive for cystic duct obstruction we will need to  decide whether he gets cholecystectomy or percutaneous cholecystostomy.   Edsel Petrin. Dalbert Batman, M.D., [facility] General and Minimally invasive Surgery Breast and Colorectal Surgery Office:   [phone]

## 2017-10-03 NOTE — Progress Notes (Signed)
Nuc Med Tech called to floor stating patient was refusing test and trying to crawl out of his bed during start of procedure.  Tech has informed Dr. Marlou Starks that patient is refusing test.  Patient agitated, stating he wants to go home tomorrow, 'why can't this be done outpatient, i'm not that sick, no one told me about this test'.  Attempted to educate patient that he is acutely ill and these tests are a way for MDs to assess his symptoms without invasive procedures.  Patient stated ' you are trying to starve me to death'.  Daughter at bedside, attempted to calm patient down.  Patient sitting in recliner with ice chips (per Dr. Sloan Leiter) at this time.  Patient and wife stated no one has talked to them about the HIDA scan or the ERCP that is ordered in the computer.  Night RN aware.

## 2017-10-04 ENCOUNTER — Encounter (HOSPITAL_COMMUNITY): Payer: Self-pay | Admitting: Internal Medicine

## 2017-10-04 ENCOUNTER — Inpatient Hospital Stay (HOSPITAL_COMMUNITY): Payer: Medicare HMO

## 2017-10-04 ENCOUNTER — Encounter (HOSPITAL_COMMUNITY)
Admission: EM | Disposition: A | Discharge status: Home Health | Payer: Self-pay | Source: Home / Self Care | Attending: Emergency Medicine

## 2017-10-04 DIAGNOSIS — J9601 Acute respiratory failure with hypoxia: Secondary | ICD-10-CM

## 2017-10-04 DIAGNOSIS — N179 Acute kidney failure, unspecified: Secondary | ICD-10-CM

## 2017-10-04 DIAGNOSIS — A419 Sepsis, unspecified organism: Secondary | ICD-10-CM

## 2017-10-04 DIAGNOSIS — K8000 Calculus of gallbladder with acute cholecystitis without obstruction: Secondary | ICD-10-CM

## 2017-10-04 DIAGNOSIS — R6521 Severe sepsis with septic shock: Secondary | ICD-10-CM

## 2017-10-04 DIAGNOSIS — R1011 Right upper quadrant pain: Secondary | ICD-10-CM

## 2017-10-04 HISTORY — DX: Sepsis, unspecified organism: A41.9

## 2017-10-04 LAB — CBC
HCT: 31 % — ABNORMAL LOW (ref 39.0–52.0)
Hemoglobin: 10.3 g/dL — ABNORMAL LOW (ref 13.0–17.0)
MCH: 28.3 pg (ref 26.0–34.0)
MCHC: 33.2 g/dL (ref 30.0–36.0)
MCV: 85.2 fL (ref 78.0–100.0)
Platelets: 226 10*3/uL (ref 150–400)
RBC: 3.64 MIL/uL — ABNORMAL LOW (ref 4.22–5.81)
RDW: 14 % (ref 11.5–15.5)
WBC: 10.8 10*3/uL — ABNORMAL HIGH (ref 4.0–10.5)

## 2017-10-04 LAB — COMPREHENSIVE METABOLIC PANEL
ALK PHOS: 53 U/L (ref 38–126)
ALT: 115 U/L — ABNORMAL HIGH (ref 17–63)
ANION GAP: 9 (ref 5–15)
AST: 53 U/L — ABNORMAL HIGH (ref 15–41)
Albumin: 3.4 g/dL — ABNORMAL LOW (ref 3.5–5.0)
BILIRUBIN TOTAL: 1.1 mg/dL (ref 0.3–1.2)
BUN: 44 mg/dL — ABNORMAL HIGH (ref 6–20)
CALCIUM: 8.4 mg/dL — AB (ref 8.9–10.3)
CO2: 17 mmol/L — ABNORMAL LOW (ref 22–32)
Chloride: 112 mmol/L — ABNORMAL HIGH (ref 101–111)
Creatinine, Ser: 1.63 mg/dL — ABNORMAL HIGH (ref 0.61–1.24)
GFR calc non Af Amer: 41 mL/min — ABNORMAL LOW (ref >60)
GFR, EST AFRICAN AMERICAN: 48 mL/min — AB (ref >60)
Glucose, Bld: 207 mg/dL — ABNORMAL HIGH (ref 65–99)
Potassium: 4 mmol/L (ref 3.5–5.1)
SODIUM: 138 mmol/L (ref 135–145)
TOTAL PROTEIN: 7 g/dL (ref 6.5–8.1)

## 2017-10-04 LAB — BASIC METABOLIC PANEL
ANION GAP: 8 (ref 5–15)
BUN: 40 mg/dL — ABNORMAL HIGH (ref 6–20)
CHLORIDE: 115 mmol/L — AB (ref 101–111)
CO2: 16 mmol/L — ABNORMAL LOW (ref 22–32)
Calcium: 8.1 mg/dL — ABNORMAL LOW (ref 8.9–10.3)
Creatinine, Ser: 1.65 mg/dL — ABNORMAL HIGH (ref 0.61–1.24)
GFR, EST AFRICAN AMERICAN: 47 mL/min — AB (ref >60)
GFR, EST NON AFRICAN AMERICAN: 41 mL/min — AB (ref >60)
Glucose, Bld: 145 mg/dL — ABNORMAL HIGH (ref 65–99)
POTASSIUM: 3.7 mmol/L (ref 3.5–5.1)
Sodium: 139 mmol/L (ref 135–145)

## 2017-10-04 LAB — BLOOD GAS, ARTERIAL
ACID-BASE DEFICIT: 7.5 mmol/L — AB (ref 0.0–2.0)
Bicarbonate: 16.1 mmol/L — ABNORMAL LOW (ref 20.0–28.0)
DRAWN BY: 103701
FIO2: 100
MECHVT: 600 mL
O2 Saturation: 99.7 %
PEEP/CPAP: 5 cmH2O
PO2 ART: 401 mmHg — AB (ref 83.0–108.0)
Patient temperature: 101.4
RATE: 20 resp/min
pCO2 arterial: 30.1 mmHg — ABNORMAL LOW (ref 32.0–48.0)
pH, Arterial: 7.358 (ref 7.350–7.450)

## 2017-10-04 LAB — PROTIME-INR
INR: 1.04
PROTHROMBIN TIME: 13.5 s (ref 11.4–15.2)

## 2017-10-04 LAB — GLUCOSE, CAPILLARY
GLUCOSE-CAPILLARY: 175 mg/dL — AB (ref 65–99)
GLUCOSE-CAPILLARY: 218 mg/dL — AB (ref 65–99)
GLUCOSE-CAPILLARY: 199 mg/dL — AB (ref 65–99)
GLUCOSE-CAPILLARY: 149 mg/dL — AB (ref 65–99)
GLUCOSE-CAPILLARY: 149 mg/dL — AB (ref 65–99)
Glucose-Capillary: 129 mg/dL — ABNORMAL HIGH (ref 65–99)

## 2017-10-04 LAB — GRAM STAIN

## 2017-10-04 LAB — TRIGLYCERIDES: TRIGLYCERIDES: 315 mg/dL — AB (ref <150)

## 2017-10-04 SURGERY — ERCP, WITH INTERVENTION IF INDICATED
Anesthesia: General

## 2017-10-04 MED ORDER — IOPAMIDOL (ISOVUE-300) INJECTION 61%
INTRAVENOUS | Status: AC
  Filled 2017-10-04: qty 30

## 2017-10-04 MED ORDER — METOPROLOL TARTRATE 5 MG/5ML IV SOLN
5.0000 mg | INTRAVENOUS | Status: DC | PRN
  Administered 2017-10-04: 5 mg via INTRAVENOUS
  Filled 2017-10-04: qty 5

## 2017-10-04 MED ORDER — MEPERIDINE HCL 100 MG/ML IJ SOLN
INTRAMUSCULAR | Status: AC
  Filled 2017-10-04: qty 1

## 2017-10-04 MED ORDER — PROPOFOL 1000 MG/100ML IV EMUL
INTRAVENOUS | Status: AC
  Administered 2017-10-04: 5 ug/kg/min via INTRAVENOUS
  Filled 2017-10-04: qty 100

## 2017-10-04 MED ORDER — PROPOFOL 1000 MG/100ML IV EMUL
0.0000 ug/kg/min | INTRAVENOUS | Status: DC
  Administered 2017-10-04: 35 ug/kg/min via INTRAVENOUS
  Administered 2017-10-04: 45 ug/kg/min via INTRAVENOUS
  Administered 2017-10-04: 5 ug/kg/min via INTRAVENOUS
  Administered 2017-10-04: 45 ug/kg/min via INTRAVENOUS
  Administered 2017-10-05: 40 ug/kg/min via INTRAVENOUS
  Administered 2017-10-05: 30 ug/kg/min via INTRAVENOUS
  Administered 2017-10-05 (×2): 35 ug/kg/min via INTRAVENOUS
  Administered 2017-10-05: 20 ug/kg/min via INTRAVENOUS
  Administered 2017-10-06 (×2): 40 ug/kg/min via INTRAVENOUS
  Filled 2017-10-04 (×10): qty 100

## 2017-10-04 MED ORDER — INSULIN ASPART 100 UNIT/ML ~~LOC~~ SOLN
2.0000 [IU] | SUBCUTANEOUS | Status: DC
  Administered 2017-10-04 (×2): 2 [IU] via SUBCUTANEOUS
  Administered 2017-10-04: 4 [IU] via SUBCUTANEOUS
  Administered 2017-10-05 (×4): 2 [IU] via SUBCUTANEOUS
  Administered 2017-10-05: 4 [IU] via SUBCUTANEOUS
  Administered 2017-10-05: 2 [IU] via SUBCUTANEOUS
  Administered 2017-10-06: 4 [IU] via SUBCUTANEOUS
  Administered 2017-10-06 (×5): 2 [IU] via SUBCUTANEOUS
  Administered 2017-10-07 (×2): 4 [IU] via SUBCUTANEOUS
  Administered 2017-10-07: 3 [IU] via SUBCUTANEOUS
  Administered 2017-10-07: 4 [IU] via SUBCUTANEOUS
  Administered 2017-10-07: 6 [IU] via SUBCUTANEOUS
  Administered 2017-10-07: 4 [IU] via SUBCUTANEOUS
  Administered 2017-10-08 (×2): 2 [IU] via SUBCUTANEOUS
  Administered 2017-10-08 (×3): 4 [IU] via SUBCUTANEOUS
  Administered 2017-10-09: 2 [IU] via SUBCUTANEOUS
  Administered 2017-10-09: 4 [IU] via SUBCUTANEOUS
  Administered 2017-10-09: 2 [IU] via SUBCUTANEOUS
  Administered 2017-10-09: 6 [IU] via SUBCUTANEOUS
  Administered 2017-10-09: 4 [IU] via SUBCUTANEOUS
  Administered 2017-10-09: 2 [IU] via SUBCUTANEOUS
  Administered 2017-10-10 (×2): 4 [IU] via SUBCUTANEOUS
  Administered 2017-10-10 – 2017-10-11 (×3): 2 [IU] via SUBCUTANEOUS
  Administered 2017-10-11 (×2): 4 [IU] via SUBCUTANEOUS

## 2017-10-04 MED ORDER — MIDAZOLAM HCL 2 MG/2ML IJ SOLN
INTRAMUSCULAR | Status: AC
  Filled 2017-10-04: qty 6

## 2017-10-04 MED ORDER — FLUMAZENIL 0.5 MG/5ML IV SOLN
INTRAVENOUS | Status: AC
  Filled 2017-10-04: qty 5

## 2017-10-04 MED ORDER — ACETAMINOPHEN 160 MG/5ML PO SOLN
650.0000 mg | Freq: Once | ORAL | Status: AC
  Administered 2017-10-04: 650 mg
  Filled 2017-10-04: qty 20.3

## 2017-10-04 MED ORDER — PIPERACILLIN-TAZOBACTAM 3.375 G IVPB
INTRAVENOUS | Status: AC
  Administered 2017-10-04: 3.375 g via INTRAVENOUS
  Filled 2017-10-04: qty 50

## 2017-10-04 MED ORDER — FENTANYL CITRATE (PF) 100 MCG/2ML IJ SOLN
50.0000 ug | INTRAMUSCULAR | Status: DC | PRN
  Administered 2017-10-04 – 2017-10-05 (×3): 50 ug via INTRAVENOUS
  Filled 2017-10-04 (×4): qty 2

## 2017-10-04 MED ORDER — FENTANYL CITRATE (PF) 100 MCG/2ML IJ SOLN
100.0000 ug | Freq: Once | INTRAMUSCULAR | Status: AC
  Administered 2017-10-04: 100 ug via INTRAVENOUS

## 2017-10-04 MED ORDER — FAMOTIDINE IN NACL 20-0.9 MG/50ML-% IV SOLN
20.0000 mg | Freq: Two times a day (BID) | INTRAVENOUS | Status: DC
  Administered 2017-10-04 – 2017-10-09 (×12): 20 mg via INTRAVENOUS
  Filled 2017-10-04 (×12): qty 50

## 2017-10-04 MED ORDER — FENTANYL CITRATE (PF) 100 MCG/2ML IJ SOLN
INTRAMUSCULAR | Status: AC
  Filled 2017-10-04: qty 6

## 2017-10-04 MED ORDER — HYDROMORPHONE HCL 1 MG/ML IJ SOLN
0.5000 mg | INTRAMUSCULAR | Status: DC | PRN
  Administered 2017-10-04: 0.5 mg via INTRAVENOUS
  Filled 2017-10-04: qty 0.5

## 2017-10-04 MED ORDER — FENTANYL CITRATE (PF) 100 MCG/2ML IJ SOLN
50.0000 ug | INTRAMUSCULAR | Status: AC | PRN
  Administered 2017-10-04 (×4): 50 ug via INTRAVENOUS
  Filled 2017-10-04 (×3): qty 2

## 2017-10-04 MED ORDER — MIDAZOLAM HCL 2 MG/2ML IJ SOLN
2.0000 mg | Freq: Once | INTRAMUSCULAR | Status: AC
  Administered 2017-10-04: 2 mg via INTRAVENOUS

## 2017-10-04 MED ORDER — NALOXONE HCL 0.4 MG/ML IJ SOLN
INTRAMUSCULAR | Status: AC
  Filled 2017-10-04: qty 1

## 2017-10-04 NOTE — Progress Notes (Signed)
During shift change report patient having nausea/vomiting and in severe pain in the abdomen. Patient with increased restlessness and was up and down from the bed to the chair. Respirations 50/minute. Zofran given for N/V and MD at bedside to assess patient. Orders received for Dilaudid 0.5mg  IV for pain, medication given and patient appeared to be a little more comfortable. MD gave orders to transfer to ICU for possible intubation to protect airway. Rapid Response RN at bedside to assess patient and assist with transfer to ICU. Temp 102.5 Rectal. BP 189/96, after receiving via IV- 10mg  of Hydralazine at 0241 and 5mg  of Metoprolol @ 0532. HR 112. O2 sats 90% on RA. Family updated by MD at the bedside.   Othella Boyer [facility] 10/04/2017 8:13 AM

## 2017-10-04 NOTE — Procedures (Signed)
Intubation Procedure Note Timothy Chapman [facility] [id number] 1945/10/10  Procedure: Intubation Indications: Airway protection and maintenance  Procedure Details Consent: Risks of procedure as well as the alternatives and risks of each were explained to the (patient/caregiver).  Consent for procedure obtained. Time Out: Verified patient identification, verified procedure, site/side was marked, verified correct patient position, special equipment/implants available, medications/allergies/relevent history reviewed, required imaging and test results available.  Performed  Maximum sterile technique was used including antiseptics, gloves, gown and hand hygiene.   MAC-4 Glidescope.   Meds: Versed 2 mg, fentanyl 100 mcg, etomidate 40 mg, rocuronium 70  Patient intubated using an 8.0 ET tube, MAC-4 glide scope.  No complications.  On initial placement it was unclear as to whether we were adequately positioned.  Endotracheal tube was withdrawn and repositioned posteriorly and inserted without difficulty.  Good position confirmed by direct visualization, end-tidal CO2, auscultation.  I participated in and supervised the entire procedure by PA-student.   Evaluation Hemodynamic Status: BP stable throughout; O2 sats: transiently fell during during procedure Patient's Current Condition: stable Complications: No apparent complications Patient did tolerate procedure well. Chest X-ray ordered to verify placement.  CXR: pending.  Baltazar Apo, MD, PhD 10/04/2017, 9:27 AM [facility] [phone] or if no answer [phone]

## 2017-10-04 NOTE — Progress Notes (Signed)
PROGRESS NOTE        PATIENT DETAILS Name: Timothy Chapman Age: 72 y.o. Sex: male Date of Birth: [birth year] Admit Date: 10/02/2017 Admitting Physician Courage Denton Brick, MD DXI:[id number], Gay Filler, MD  Brief Narrative: Patient is a 72 y.o. male with prior history of DM-2, hypertension, GERD who presented to the ED with complaints of diffuse abdominal pain, along with nausea, vomiting, diarrhea and fever.  He was subsequently evaluated and admitted to the hospitalist service.  Initially concerns for either gastroenteritis or biliary infection-given significant amounts of anxiety/agitation-numerous diagnostic studies were attempted but were inconclusive due to motion artifact/lack of cooperation.He was maintained on empiric Zosyn throughout this hospital stay, on 1/18, patient had markedly deteriorated with worsening abdominal pain, tachypnea and persistent fever.  After discussion with general surgery-patient was transferred to the intensive care unit-for elective intubation-and subsequent cholecystostomy tube placement and possible ERCP.  See below for further details  Subjective: Looks markedly worse than yesterday-very tachypneic.  Complains of diffuse abdominal pain.  Sitting a chair at bedside.   Assessment/Plan: Sepsis likely secondary to acute calculus cholecystitis: Initially diagnosis was not very evident-biliary etiology was being ruled out with numerous radiological studies (either inconclusive or incomplete)-it appears that the patient has a markedly worsened-with significant tachypnea-he will be transferred to the intensive care unit-where he will be intubated-so that we can then proceed with cholecystostomy tube placement, and further imaging. CT of the abdomen is also currently pending to rule out other etiologies.  Plans are to continue with Zosyn-follow cultures.  Have spoken with Dr. Dalbert Batman of general surgery service-who actually recommended  above-subsequently spoke with GI MD over the phone in regards to possible ERCP.  GI does not think patient has choledocholithiasis/cholangitis at this time.  Have also consulted Dr. Gabriel Rung.    Acute hypoxic respiratory failure: Tachypneic-likely due to worsening sepsis pathophysiology due to probable calculus cholecystitis.  As noted above-we will likely need to be intubated in order to proceed with further imaging and intervention.  Have consulted critical care MD-being transferred to the ICU.  See above.  Acute kidney injury: Likely hemodynamically mediated in the setting of sepsis and ACE inhibitor use-improving with supportive care.  Continue to avoid nephrotoxic agents.  Metabolic encephalopathy: Probably due to renal failure and sepsis pathophysiology.  Nonfocal exam-follow some commands.  Metabolic encephalopathy should improve after treatment of underlying sepsis.  Monitor closely.  Hypertension: Blood pressure relatively well controlled-continue to hold Lotensin.  DM-2:  relatively stable with SSI  DVT Prophylaxis: Prophylactic Heparin   Code Status: Full code   Family Communication: Spouse at bedside  Disposition Plan: Remain inpatient-transfer to ICU  Antimicrobial agents: Anti-infectives (From admission, onward)   Start     Dose/Rate Route Frequency Ordered Stop   10/02/17 2200  piperacillin-tazobactam (ZOSYN) IVPB 3.375 g     3.375 g 12.5 mL/hr over 240 Minutes Intravenous Every 8 hours 10/02/17 1257     10/02/17 1400  vancomycin (VANCOCIN) IVPB 1000 mg/200 mL premix  Status:  Discontinued     1,000 mg 200 mL/hr over 60 Minutes Intravenous Every 24 hours 10/02/17 1257 10/02/17 1843   10/02/17 1300  piperacillin-tazobactam (ZOSYN) IVPB 3.375 g     3.375 g 100 mL/hr over 30 Minutes Intravenous  Once 10/02/17 1257 10/02/17 1352      Procedures: None  CONSULTS:  pulmonary/intensive care, GI and general  surgery  Time spent: 50 minutes-Greater than 50% of this  time was spent in counseling, explanation of diagnosis, planning of further management, and coordination of care.  MEDICATIONS: Scheduled Meds: . heparin  5,000 Units Subcutaneous Q8H  . insulin aspart  0-9 Units Subcutaneous TID WC  . multivitamin with minerals  1 tablet Oral Daily  . pantoprazole  40 mg Oral BID  . senna  1 tablet Oral BID  . sodium chloride flush  3 mL Intravenous Q12H   Continuous Infusions: . sodium chloride    . sodium chloride 125 mL/hr at 10/03/17 2042  . piperacillin-tazobactam (ZOSYN)  IV 3.375 g (10/04/17 0009)  . sodium chloride 0.9 % 1,000 mL infusion 999 mL/hr at 10/02/17 2038   PRN Meds:.sodium chloride, acetaminophen **OR** acetaminophen, albuterol, hydrALAZINE, HYDROmorphone (DILAUDID) injection, iopamidol, LORazepam, metoprolol tartrate, ondansetron **OR** ondansetron (ZOFRAN) IV, polyethylene glycol, sodium chloride flush, traMADol, traZODone   PHYSICAL EXAM: Vital signs: Vitals:   10/03/17 2300 10/04/17 0230 10/04/17 0444 10/04/17 0615  BP: 138/79 (!) 173/100 (!) 172/107 (!) 181/78  Pulse: 95 92 88 81  Resp: 18 20 (!) 22   Temp: 98.7 F (37.1 C) 98.8 F (37.1 C) 98.9 F (37.2 C)   TempSrc: Oral Oral Oral   SpO2: 99% 100% 98%   Weight:      Height:       Filed Weights   10/02/17 0936 10/02/17 2206  Weight: 104.3 kg (230 lb) 110.3 kg (243 lb 3.2 oz)   Body mass index is 35.91 kg/m.   General appearance: Acutely sick appearing-very tachypneic.  Answer some questions appropriately. Eyes:, pupils equally reactive to light and accomodation HEENT: Atraumatic and Normocephalic Neck: supple, no JVD. Resp:Good air entry bilaterally, bibasilar rales CVS: S1 S2 regular, no murmurs.  GI: Bowel sounds present, abdomen seems to be more distended today-he has more RUQ tenderness today compared to yesterday.  He still has generalized tenderness-he has significant amount of guarding this morning. Extremities: B/L Lower Ext shows no edema, both legs  are warm to touch Neurology:  speech clear,Non focal, sensation is grossly intact. Musculoskeletal:No digital cyanosis Skin:No Rash, warm and dry Wounds:N/A  I have personally reviewed following labs and imaging studies  LABORATORY DATA: CBC: Recent Labs  Lab 10/02/17 0948 10/03/17 0443 10/03/17 2340 10/04/17 0556  WBC 10.4 10.7* 9.6 10.8*  NEUTROABS 8.1*  --   --   --   HGB 12.3* 11.1* 9.9* 10.3*  HCT 36.3* 32.4* 29.7* 31.0*  MCV 85.2 85.7 85.8 85.2  PLT 238 192 207 322    Basic Metabolic Panel: Recent Labs  Lab 10/02/17 0948 10/03/17 0443 10/04/17 0412  NA 133* 135 138  K 4.3 4.2 4.0  CL 100* 110 112*  CO2 19* 16* 17*  GLUCOSE 222* 173* 207*  BUN 47* 48* 44*  CREATININE 3.42* 2.87* 1.63*  CALCIUM 9.1 7.8* 8.4*    GFR: Estimated Creatinine Clearance: 52.3 mL/min (A) (by C-G formula based on SCr of 1.63 mg/dL (H)).  Liver Function Tests: Recent Labs  Lab 10/02/17 0948 10/03/17 0443 10/04/17 0412  AST 89* 43* 53*  ALT 259* 156* 115*  ALKPHOS 73 56 53  BILITOT 1.0 1.3* 1.1  PROT 7.5 6.6 7.0  ALBUMIN 3.7 3.2* 3.4*   Recent Labs  Lab 10/02/17 0948 10/03/17 0443  LIPASE 30 24   No results for input(s): AMMONIA in the last 168 hours.  Coagulation Profile: Recent Labs  Lab 10/04/17 0412  INR 1.04    Cardiac Enzymes:  No results for input(s): CKTOTAL, CKMB, CKMBINDEX, TROPONINI in the last 168 hours.  BNP (last 3 results) No results for input(s): PROBNP in the last 8760 hours.  HbA1C: No results for input(s): HGBA1C in the last 72 hours.  CBG: Recent Labs  Lab 10/03/17 0746 10/03/17 1147 10/03/17 1643 10/03/17 2057 10/04/17 0228  GLUCAP 185* 184* 151* 152* 175*    Lipid Profile: No results for input(s): CHOL, HDL, LDLCALC, TRIG, CHOLHDL, LDLDIRECT in the last 72 hours.  Thyroid Function Tests: No results for input(s): TSH, T4TOTAL, FREET4, T3FREE, THYROIDAB in the last 72 hours.  Anemia Panel: No results for input(s):  VITAMINB12, FOLATE, FERRITIN, TIBC, IRON, RETICCTPCT in the last 72 hours.  Urine analysis:    Component Value Date/Time   COLORURINE YELLOW 04/11/2007 [place] 04/11/2007 0917   LABSPEC 1.030 04/11/2007 0917   PHURINE 5.0 04/11/2007 0917   GLUCOSEU NEGATIVE 04/11/2007 0917   HGBUR NEGATIVE 04/11/2007 0917   BILIRUBINUR SMALL (A) 04/11/2007 0917   KETONESUR 15 (A) 04/11/2007 0917   PROTEINUR NEGATIVE 04/11/2007 0917   UROBILINOGEN 0.2 04/11/2007 0917   NITRITE NEGATIVE 04/11/2007 0917   LEUKOCYTESUR  04/11/2007 0917    NEGATIVE MICROSCOPIC NOT DONE ON URINES WITH NEGATIVE PROTEIN, BLOOD, LEUKOCYTES, NITRITE, OR GLUCOSE <1000 mg/dL.    Sepsis Labs: Lactic Acid, Venous    Component Value Date/Time   LATICACIDVEN 1.33 10/02/2017 1549    MICROBIOLOGY: No results found for this or any previous visit (from the past 240 hour(s)).  RADIOLOGY STUDIES/RESULTS: Nm Hepatobiliary Liver Func  Result Date: 10/03/2017 CLINICAL DATA:  Clinical concern for cystic duct obstruction. EXAM: NUCLEAR MEDICINE HEPATOBILIARY IMAGING TECHNIQUE: Sequential images of the abdomen were obtained out to 60 minutes following intravenous administration of radiopharmaceutical. RADIOPHARMACEUTICALS:  5.5 millicurie mCi [id number]  Choletec IV COMPARISON:  Ultrasound exam from yesterday. MRI from earlier today. FINDINGS: The patient was unable to complete the exam and terminated the study after 25 minutes of imaging. During this 25 minutes, there is brisk uptake of radiotracer by the hepatic parenchyma with rapid normal clearance of blood pool activity. Common duct activity is visible by 15 minutes and gut activity is visible by 20 minutes. No gallbladder filling observed during this initial 25 minutes of imaging. IMPRESSION: 1. Patent common bile duct. 2. Cystic duct patency cannot be assessed on this study as patient terminated the exam before it was complete. Electronically Signed   By: Misty Stanley M.D.    On: 10/03/2017 19:36   Mr Abdomen Mrcp Wo Contrast  Result Date: 10/03/2017 CLINICAL DATA:  Jaundice with abdominal pain. Gallstones noted on recent ultrasound exam. EXAM: MRI ABDOMEN WITHOUT CONTRAST  (INCLUDING MRCP) TECHNIQUE: Multiplanar multisequence MR imaging of the abdomen was performed. Heavily T2-weighted images of the biliary and pancreatic ducts were obtained, and three-dimensional MRCP images were rendered by post processing. COMPARISON:  Ultrasound exam 10/02/2017 FINDINGS: Study is markedly motion degraded due to patient inability to reproducibly breath hold. Lower chest: Heart size upper normal. Small bilateral pleural effusions. Hepatobiliary: Liver measures 24.3 cm craniocaudal length, enlarged. Diffuse fatty deposition noted within the liver parenchyma. Tiny stones are seen dependently in the lumen of the gallbladder (images 30 and 31 of series 13). There is some debris or possible stones in the fundus of the gallbladder. Trace peri cholecystic fluid evident. No substantial intrahepatic biliary duct dilatation. The common duct and common bile ducts are not well evaluated due to the substantial motion artifact. Extrahepatic common duct measures 7 mm  diameter where visualized on axial single shot T2 weighted imaging. Common bile duct in the head of the pancreas is 7 mm diameter although incompletely visualized. There is some central flow artifact within the common bile duct on the axial T2 imaging. Pancreas: No dilatation of the main duct. 17 x 11 x 20 mm cystic lesion identified in the posterior pancreatic head. Susceptibility artifact in the head of the pancreas on multiple sequences may be related to gas and duodenal diverticuli or parenchymal calcification in the pancreatic head. Spleen:  No splenomegaly. No focal mass lesion. Adrenals/Urinary Tract: No adrenal nodule or mass. 4.1 cm simple appearing cyst identified posterior right kidney. No overtly suspicious lesion identified in either  kidney on this noncontrast exam. Stomach/Bowel: Stomach is nondistended. Duodenum is normally positioned as is the ligament of Treitz. No small bowel or colonic dilatation within the visualized abdomen. Vascular/Lymphatic: No abdominal aortic aneurysm. Signal void within the portal vein suggests patency. No abdominal lymphadenopathy. Other:  Trace fluid is noted at the inferior liver. Musculoskeletal: No abnormal marrow signal within the visualized bony anatomy. IMPRESSION: 1. Markedly motion degraded study due to patient inability to reproducibly breath hold. Common duct and common bile duct are incompletely evaluated. Where visualized, the extrahepatic biliary tree measures up to 7 mm diameter. Portions of the common bile duct entering and tracking through the head of pancreas are not visualized. 2. Tiny gallstones noted in the dependent gallbladder lumen. There is trace pericholecystic fluid. Debris is noted in the gallbladder fundus. 3. No dilatation of the main pancreatic duct with a 7 x 11 x 20 mm cystic lesion in the posterior pancreatic head showing no definite communication to the main duct but motion artifact limits assessment. Repeat MRI in 6 months recommended to re-evaluate. This recommendation follows ACR consensus guidelines: Management of Incidental Pancreatic Cysts: A White Paper of the ACR Incidental Findings Committee. [place] [id number]. 4. Hepatic steatosis with hepatomegaly. 5. Small bilateral pleural effusions. Electronically Signed   By: Misty Stanley M.D.   On: 10/03/2017 16:04   Dg Abdomen Acute W/chest  Result Date: 10/02/2017 CLINICAL DATA:  72 year old male with abdominal pain, distention, vomiting, diarrhea. Symptoms for 3 days. EXAM: DG ABDOMEN ACUTE W/ 1V CHEST COMPARISON:  Chest radiograph 04/11/2007. Lumbar radiographs 02/11/2014. FINDINGS: Lung volumes are stable and within normal limits. Normal cardiac size and mediastinal contours. Visualized tracheal air column  is within normal limits. No pneumothorax or pneumoperitoneum. No abnormal pulmonary opacity. Multiple upper limits of normal to abnormally dilated gas containing small bowel loops in the mid abdomen, up to 4.5-5 centimeters diameter. Paucity of large bowel gas. Other abdominal and pelvic visceral contours appear normal. There are surgical clips about the pubic symphysis which are new since 2,015. Degenerative changes in the spine. No acute osseous abnormality identified. IMPRESSION: 1. Bowel-gas pattern suggesting a small bowel obstruction or small bowel ileus. No free air. 2.  No acute cardiopulmonary abnormality. Electronically Signed   By: Genevie Ann M.D.   On: 10/02/2017 10:18   US Abdomen Limited Ruq  Result Date: 10/02/2017 CLINICAL DATA:  Abdominal pain EXAM: ULTRASOUND ABDOMEN LIMITED RIGHT UPPER QUADRANT COMPARISON:  None. FINDINGS: Gallbladder: 2 small echogenic foci in the gallbladder do not move or shadow and could be polyps or stones. Gallbladder wall 2 mm. Mild pericholecystic fluid. Negative sonographic Murphy sign Common bile duct: Diameter: Mildly prominent 8 mm Liver: Echogenic liver difficult to image without focal abnormality. Portal vein is patent on color Doppler imaging with  normal direction of blood flow towards the liver. IMPRESSION: Small polyps versus adherent gallstones. Pericholecystic fluid. Negative sonographic Murphy sign. Common bile duct mildly dilated 8 mm. Fatty liver Electronically Signed   By: Franchot Gallo M.D.   On: 10/02/2017 13:05     LOS: 2 days   Oren Binet, MD  [facility] Pager:[phone]  If 7PM-7AM, please contact night-coverage [email or web address] Password [facility] 10/04/2017, 7:45 AM

## 2017-10-04 NOTE — Progress Notes (Signed)
Patient is Alert and oriented, however he has moments of confusion. As the night progressed, family reported the patient was "talking out of his head." Orientation was reassessed and patient remained oriented however there were moments in which the patient was slow to make decisions and Wife reported odd conversations from the patient. There was noted increased diarrhea, weakness and [place] compared to the previous night. Concerns of the patient's condition were communicated to the MD along with assessment findings. MD reviewed patients chart and advised to continue to monitor the patient. Patient also had increased blood pressure which did not seem to respond to PRN medication when reassessed, MD was made aware and ordered another PRN medication. Patient's blood pressure still remained high after second PRN medication was administered. MD made aware. Family expressed increasing concern for the patient. The surgeon, later followed by internal medicine, came and updated the family with findings and communicated the new plan of care addressing the families concerns. It was determined that the patient would undergo further testing and anesthesiology was consulted to aid in the patients ability to complete needed tests. The patient was also ordered to transfer to ICU/Stepdown. Report was given to the receiving nurse.

## 2017-10-04 NOTE — Procedures (Signed)
I Pre-operative Diagnosis: Acute cholecystitis      Post-operative Diagnosis: Acute cholecystitis   Indications: Abdominal pain.  Confusion.  Procedure: Image guided cholecystostomy tube placement  Findings: Dilated gallbladder with wall thickening and perihepatic ascites.  10 Fr drain placed in gallbladder.  Removed 90 ml of cloudy brownish fluid.    Complications: No immediate      EBL: Minimal  Plan: Back to ICU.  Send fluid for culture.

## 2017-10-04 NOTE — Progress Notes (Signed)
Discussed patient with Dr. Angela Cox, the hospitalist, on behalf of Dr. Nyoka Cowden. Hospitalist states patient will be transferred to ICU and Critical Care service will assume care. Instructed MD Anesthesia is available if needed.

## 2017-10-04 NOTE — Progress Notes (Signed)
[facility] ROUNDING NOTE   Subjective:  Worsening clinical status with respiratory distress, abdominal pain and confusion. Transferred to ICU this morning.  MRCP poor quality due to motion artifact. Dr Lamonte Sakai was preparing to intubate the patient soon.   Objective: Vital signs in last 24 hours: Temp:  [98.2 F (36.8 C)-102.5 F (39.2 C)] 102.5 F (39.2 C) (01/18 0747) Pulse Rate:  [81-112] 112 (01/18 0747) Resp:  [18-32] 32 (01/18 0747) BP: (122-189)/(60-109) 189/96 (01/18 0747) SpO2:  [90 %-100 %] 90 % (01/18 0747) Last BM Date: 10/03/17 General: NAD Lungs: tachypnic  Heart: tachycardic, s1s2 Abdomen: distended, firm, generalized tenderness     Intake/Output from previous day: 01/17 0701 - 01/18 0700 In: 4754.2 [I.V.:4554.2; IV Piggyback:200] Out: -  Intake/Output this shift: No intake/output data recorded.   Lab Results: Recent Labs    10/03/17 0443 10/03/17 2340 10/04/17 0556  WBC 10.7* 9.6 10.8*  HGB 11.1* 9.9* 10.3*  PLT 192 207 226  MCV 85.7 85.8 85.2   BMET Recent Labs    10/02/17 0948 10/03/17 0443 10/04/17 0412  NA 133* 135 138  K 4.3 4.2 4.0  CL 100* 110 112*  CO2 19* 16* 17*  GLUCOSE 222* 173* 207*  BUN 47* 48* 44*  CREATININE 3.42* 2.87* 1.63*  CALCIUM 9.1 7.8* 8.4*   LFT Recent Labs    10/02/17 0948 10/03/17 0443 10/04/17 0412  PROT 7.5 6.6 7.0  ALBUMIN 3.7 3.2* 3.4*  AST 89* 43* 53*  ALT 259* 156* 115*  ALKPHOS 73 56 53  BILITOT 1.0 1.3* 1.1   PT/INR Recent Labs    10/04/17 0412  INR 1.04      Imaging/Other results: Nm Hepatobiliary Liver Func  Result Date: 10/03/2017 CLINICAL DATA:  Clinical concern for cystic duct obstruction. EXAM: NUCLEAR MEDICINE HEPATOBILIARY IMAGING TECHNIQUE: Sequential images of the abdomen were obtained out to 60 minutes following intravenous administration of radiopharmaceutical. RADIOPHARMACEUTICALS:  5.5 millicurie mCi [id number]  Choletec IV COMPARISON:  Ultrasound exam from  yesterday. MRI from earlier today. FINDINGS: The patient was unable to complete the exam and terminated the study after 25 minutes of imaging. During this 25 minutes, there is brisk uptake of radiotracer by the hepatic parenchyma with rapid normal clearance of blood pool activity. Common duct activity is visible by 15 minutes and gut activity is visible by 20 minutes. No gallbladder filling observed during this initial 25 minutes of imaging. IMPRESSION: 1. Patent common bile duct. 2. Cystic duct patency cannot be assessed on this study as patient terminated the exam before it was complete. Electronically Signed   By: Misty Stanley M.D.   On: 10/03/2017 19:36   Mr Abdomen Mrcp Wo Contrast  Result Date: 10/03/2017 CLINICAL DATA:  Jaundice with abdominal pain. Gallstones noted on recent ultrasound exam. EXAM: MRI ABDOMEN WITHOUT CONTRAST  (INCLUDING MRCP) TECHNIQUE: Multiplanar multisequence MR imaging of the abdomen was performed. Heavily T2-weighted images of the biliary and pancreatic ducts were obtained, and three-dimensional MRCP images were rendered by post processing. COMPARISON:  Ultrasound exam 10/02/2017 FINDINGS: Study is markedly motion degraded due to patient inability to reproducibly breath hold. Lower chest: Heart size upper normal. Small bilateral pleural effusions. Hepatobiliary: Liver measures 24.3 cm craniocaudal length, enlarged. Diffuse fatty deposition noted within the liver parenchyma. Tiny stones are seen dependently in the lumen of the gallbladder (images 30 and 31 of series 13). There is some debris or possible stones in the fundus of the gallbladder. Trace peri cholecystic fluid evident. No substantial intrahepatic  biliary duct dilatation. The common duct and common bile ducts are not well evaluated due to the substantial motion artifact. Extrahepatic common duct measures 7 mm diameter where visualized on axial single shot T2 weighted imaging. Common bile duct in the head of the pancreas  is 7 mm diameter although incompletely visualized. There is some central flow artifact within the common bile duct on the axial T2 imaging. Pancreas: No dilatation of the main duct. 17 x 11 x 20 mm cystic lesion identified in the posterior pancreatic head. Susceptibility artifact in the head of the pancreas on multiple sequences may be related to gas and duodenal diverticuli or parenchymal calcification in the pancreatic head. Spleen:  No splenomegaly. No focal mass lesion. Adrenals/Urinary Tract: No adrenal nodule or mass. 4.1 cm simple appearing cyst identified posterior right kidney. No overtly suspicious lesion identified in either kidney on this noncontrast exam. Stomach/Bowel: Stomach is nondistended. Duodenum is normally positioned as is the ligament of Treitz. No small bowel or colonic dilatation within the visualized abdomen. Vascular/Lymphatic: No abdominal aortic aneurysm. Signal void within the portal vein suggests patency. No abdominal lymphadenopathy. Other:  Trace fluid is noted at the inferior liver. Musculoskeletal: No abnormal marrow signal within the visualized bony anatomy. IMPRESSION: 1. Markedly motion degraded study due to patient inability to reproducibly breath hold. Common duct and common bile duct are incompletely evaluated. Where visualized, the extrahepatic biliary tree measures up to 7 mm diameter. Portions of the common bile duct entering and tracking through the head of pancreas are not visualized. 2. Tiny gallstones noted in the dependent gallbladder lumen. There is trace pericholecystic fluid. Debris is noted in the gallbladder fundus. 3. No dilatation of the main pancreatic duct with a 7 x 11 x 20 mm cystic lesion in the posterior pancreatic head showing no definite communication to the main duct but motion artifact limits assessment. Repeat MRI in 6 months recommended to re-evaluate. This recommendation follows ACR consensus guidelines: Management of Incidental Pancreatic Cysts: A  White Paper of the ACR Incidental Findings Committee. [place] 5465;68:[id number]. 4. Hepatic steatosis with hepatomegaly. 5. Small bilateral pleural effusions. Electronically Signed   By: Misty Stanley M.D.   On: 10/03/2017 16:04   Dg Abdomen Acute W/chest  Result Date: 10/02/2017 CLINICAL DATA:  72 year old male with abdominal pain, distention, vomiting, diarrhea. Symptoms for 3 days. EXAM: DG ABDOMEN ACUTE W/ 1V CHEST COMPARISON:  Chest radiograph 04/11/2007. Lumbar radiographs 02/11/2014. FINDINGS: Lung volumes are stable and within normal limits. Normal cardiac size and mediastinal contours. Visualized tracheal air column is within normal limits. No pneumothorax or pneumoperitoneum. No abnormal pulmonary opacity. Multiple upper limits of normal to abnormally dilated gas containing small bowel loops in the mid abdomen, up to 4.5-5 centimeters diameter. Paucity of large bowel gas. Other abdominal and pelvic visceral contours appear normal. There are surgical clips about the pubic symphysis which are new since 2,015. Degenerative changes in the spine. No acute osseous abnormality identified. IMPRESSION: 1. Bowel-gas pattern suggesting a small bowel obstruction or small bowel ileus. No free air. 2.  No acute cardiopulmonary abnormality. Electronically Signed   By: Genevie Ann M.D.   On: 10/02/2017 10:18   US Abdomen Limited Ruq  Result Date: 10/02/2017 CLINICAL DATA:  Abdominal pain EXAM: ULTRASOUND ABDOMEN LIMITED RIGHT UPPER QUADRANT COMPARISON:  None. FINDINGS: Gallbladder: 2 small echogenic foci in the gallbladder do not move or shadow and could be polyps or stones. Gallbladder wall 2 mm. Mild pericholecystic fluid. Negative sonographic Percell Miller sign  Common bile duct: Diameter: Mildly prominent 8 mm Liver: Echogenic liver difficult to image without focal abnormality. Portal vein is patent on color Doppler imaging with normal direction of blood flow towards the liver. IMPRESSION: Small polyps versus  adherent gallstones. Pericholecystic fluid. Negative sonographic Murphy sign. Common bile duct mildly dilated 8 mm. Fatty liver Electronically Signed   By: Franchot Gallo M.D.   On: 10/02/2017 13:05      Assessment &Plan  72 yr male admitted with abdominal pain, fever, nausea and vomiting.  Evidence of pericholecystic fluid and gallstones on abdominal ultrasound.  MRCP was significantly limited due to motion degradation, there was no proximal or mid CBD dilation, 7 mm in size.  Distal CBD was not visualized.  He does not have elevated bilirubin or alk phos to suggest cholestasis or biliary obstruction. HIDA scan was incomplete but did note tracer in CBD along with prompt flow of tracer into the small bowel.  He has acute abdomen based on exam Plan for CT abdomen & pelvis this morning post intubation to evaluate  Dr Dalbert Batman requested IR for Cholecystostomy  Based on labs and imaging done so far, it seems very unlikely that he has CBD obstruction or cholangitis is precipitating his symptoms Will f/u CT abd & pelvis We will be available for any questions or concerns    K. Denzil Magnuson , MD [phone] Mon-Fri 8a-5p [phone] after 5p, weekends, holidays [facility]

## 2017-10-04 NOTE — Progress Notes (Signed)
150 mcg of Fentanyl wasted in the trash from three separate 176mcg vials where 27mcg of each vial was administered to the patient and documented in the [facility]. Each waste witnessed by Elwin Mocha, RN at time of medication administration.

## 2017-10-04 NOTE — Progress Notes (Addendum)
Subjective: Remains confused, poorly cooperative. MRCP not diagnostic for acute cholecystitis or common duct stones due to motion artifact trace peri-cholecystic fluid. Hepatobiliary scan shows patent common bile duct but the patient terminated the procedure early and so inconclusive about cystic duct obstruction Gallbladder did not fill on early phase, however  This morning wife, daughter, and son-in-law in room Temp has come down to 98.9.  Heart rate down to 81.  SPO2 98%.  BP 181/78  Physical exam has changed with crackles at both lung bases and localized tenderness and guarding in the right upper quadrant.confusion continues.  Lab work shows bilirubin 1.1.  Alkaline phosphatase normal.  AST and ALTs still elevated somewhat.  Creatinine 1.63, a little better.  WBC 10,800.  Hemoglobin 10.3.  INR 1.04.  Glucose 207.    Objective: Vital signs in last 24 hours: Temp:  [98.7 F (37.1 C)-100.9 F (38.3 C)] 98.9 F (37.2 C) (01/18 0444) Pulse Rate:  [81-102] 81 (01/18 0615) Resp:  [18-22] 22 (01/18 0444) BP: (122-181)/(60-109) 181/78 (01/18 0615) SpO2:  [97 %-100 %] 98 % (01/18 0444) Last BM Date: 10/03/17  Intake/Output from previous day: 01/17 0701 - 01/18 0700 In: 2904.2 [I.V.:2804.2; IV Piggyback:100] Out: -  Intake/Output this shift: No intake/output data recorded.  General appearance: Slightly agitated.  Can't sit still.  Did cooperate for exam.  Somewhat confused which is a well change for him at home Resp: Crackles at both lung bases GI: Soft.  Borderline distended.  Exam today reveals localized tenderness and guarding in the right upper quadrant.  I do not palpate a mass.  Much less tender elsewhere.  Lab Results:  Recent Labs    10/03/17 0443 10/03/17 2340  WBC 10.7* 9.6  HGB 11.1* 9.9*  HCT 32.4* 29.7*  PLT 192 207   BMET Recent Labs    10/03/17 0443 10/04/17 0412  NA 135 138  K 4.2 4.0  CL 110 112*  CO2 16* 17*  GLUCOSE 173* 207*  BUN 48* 44*   CREATININE 2.87* 1.63*  CALCIUM 7.8* 8.4*   PT/INR Recent Labs    10/04/17 0412  LABPROT 13.5  INR 1.04   ABG No results for input(s): PHART, HCO3 in the last 72 hours.  Invalid input(s): PCO2, PO2  Studies/Results: Nm Hepatobiliary Liver Func  Result Date: 10/03/2017 CLINICAL DATA:  Clinical concern for cystic duct obstruction. EXAM: NUCLEAR MEDICINE HEPATOBILIARY IMAGING TECHNIQUE: Sequential images of the abdomen were obtained out to 60 minutes following intravenous administration of radiopharmaceutical. RADIOPHARMACEUTICALS:  5.5 millicurie mCi [id number]  Choletec IV COMPARISON:  Ultrasound exam from yesterday. MRI from earlier today. FINDINGS: The patient was unable to complete the exam and terminated the study after 25 minutes of imaging. During this 25 minutes, there is brisk uptake of radiotracer by the hepatic parenchyma with rapid normal clearance of blood pool activity. Common duct activity is visible by 15 minutes and gut activity is visible by 20 minutes. No gallbladder filling observed during this initial 25 minutes of imaging. IMPRESSION: 1. Patent common bile duct. 2. Cystic duct patency cannot be assessed on this study as patient terminated the exam before it was complete. Electronically Signed   By: Misty Stanley M.D.   On: 10/03/2017 19:36   Mr Abdomen Mrcp Wo Contrast  Result Date: 10/03/2017 CLINICAL DATA:  Jaundice with abdominal pain. Gallstones noted on recent ultrasound exam. EXAM: MRI ABDOMEN WITHOUT CONTRAST  (INCLUDING MRCP) TECHNIQUE: Multiplanar multisequence MR imaging of the abdomen was performed. Heavily T2-weighted images of the  biliary and pancreatic ducts were obtained, and three-dimensional MRCP images were rendered by post processing. COMPARISON:  Ultrasound exam 10/02/2017 FINDINGS: Study is markedly motion degraded due to patient inability to reproducibly breath hold. Lower chest: Heart size upper normal. Small bilateral pleural effusions. Hepatobiliary:  Liver measures 24.3 cm craniocaudal length, enlarged. Diffuse fatty deposition noted within the liver parenchyma. Tiny stones are seen dependently in the lumen of the gallbladder (images 30 and 31 of series 13). There is some debris or possible stones in the fundus of the gallbladder. Trace peri cholecystic fluid evident. No substantial intrahepatic biliary duct dilatation. The common duct and common bile ducts are not well evaluated due to the substantial motion artifact. Extrahepatic common duct measures 7 mm diameter where visualized on axial single shot T2 weighted imaging. Common bile duct in the head of the pancreas is 7 mm diameter although incompletely visualized. There is some central flow artifact within the common bile duct on the axial T2 imaging. Pancreas: No dilatation of the main duct. 17 x 11 x 20 mm cystic lesion identified in the posterior pancreatic head. Susceptibility artifact in the head of the pancreas on multiple sequences may be related to gas and duodenal diverticuli or parenchymal calcification in the pancreatic head. Spleen:  No splenomegaly. No focal mass lesion. Adrenals/Urinary Tract: No adrenal nodule or mass. 4.1 cm simple appearing cyst identified posterior right kidney. No overtly suspicious lesion identified in either kidney on this noncontrast exam. Stomach/Bowel: Stomach is nondistended. Duodenum is normally positioned as is the ligament of Treitz. No small bowel or colonic dilatation within the visualized abdomen. Vascular/Lymphatic: No abdominal aortic aneurysm. Signal void within the portal vein suggests patency. No abdominal lymphadenopathy. Other:  Trace fluid is noted at the inferior liver. Musculoskeletal: No abnormal marrow signal within the visualized bony anatomy. IMPRESSION: 1. Markedly motion degraded study due to patient inability to reproducibly breath hold. Common duct and common bile duct are incompletely evaluated. Where visualized, the extrahepatic biliary tree  measures up to 7 mm diameter. Portions of the common bile duct entering and tracking through the head of pancreas are not visualized. 2. Tiny gallstones noted in the dependent gallbladder lumen. There is trace pericholecystic fluid. Debris is noted in the gallbladder fundus. 3. No dilatation of the main pancreatic duct with a 7 x 11 x 20 mm cystic lesion in the posterior pancreatic head showing no definite communication to the main duct but motion artifact limits assessment. Repeat MRI in 6 months recommended to re-evaluate. This recommendation follows ACR consensus guidelines: Management of Incidental Pancreatic Cysts: A White Paper of the ACR Incidental Findings Committee. [place] 6440;[id number]. 4. Hepatic steatosis with hepatomegaly. 5. Small bilateral pleural effusions. Electronically Signed   By: Misty Stanley M.D.   On: 10/03/2017 16:04   Dg Abdomen Acute W/chest  Result Date: 10/02/2017 CLINICAL DATA:  72 year old male with abdominal pain, distention, vomiting, diarrhea. Symptoms for 3 days. EXAM: DG ABDOMEN ACUTE W/ 1V CHEST COMPARISON:  Chest radiograph 04/11/2007. Lumbar radiographs 02/11/2014. FINDINGS: Lung volumes are stable and within normal limits. Normal cardiac size and mediastinal contours. Visualized tracheal air column is within normal limits. No pneumothorax or pneumoperitoneum. No abnormal pulmonary opacity. Multiple upper limits of normal to abnormally dilated gas containing small bowel loops in the mid abdomen, up to 4.5-5 centimeters diameter. Paucity of large bowel gas. Other abdominal and pelvic visceral contours appear normal. There are surgical clips about the pubic symphysis which are new since 2,015. Degenerative changes in  the spine. No acute osseous abnormality identified. IMPRESSION: 1. Bowel-gas pattern suggesting a small bowel obstruction or small bowel ileus. No free air. 2.  No acute cardiopulmonary abnormality. Electronically Signed   By: Genevie Ann M.D.   On:  10/02/2017 10:18   US Abdomen Limited Ruq  Result Date: 10/02/2017 CLINICAL DATA:  Abdominal pain EXAM: ULTRASOUND ABDOMEN LIMITED RIGHT UPPER QUADRANT COMPARISON:  None. FINDINGS: Gallbladder: 2 small echogenic foci in the gallbladder do not move or shadow and could be polyps or stones. Gallbladder wall 2 mm. Mild pericholecystic fluid. Negative sonographic Murphy sign Common bile duct: Diameter: Mildly prominent 8 mm Liver: Echogenic liver difficult to image without focal abnormality. Portal vein is patent on color Doppler imaging with normal direction of blood flow towards the liver. IMPRESSION: Small polyps versus adherent gallstones. Pericholecystic fluid. Negative sonographic Murphy sign. Common bile duct mildly dilated 8 mm. Fatty liver Electronically Signed   By: Franchot Gallo M.D.   On: 10/02/2017 13:05    Anti-infectives: Anti-infectives (From admission, onward)   Start     Dose/Rate Route Frequency Ordered Stop   10/02/17 2200  piperacillin-tazobactam (ZOSYN) IVPB 3.375 g     3.375 g 12.5 mL/hr over 240 Minutes Intravenous Every 8 hours 10/02/17 1257     10/02/17 1400  vancomycin (VANCOCIN) IVPB 1000 mg/200 mL premix  Status:  Discontinued     1,000 mg 200 mL/hr over 60 Minutes Intravenous Every 24 hours 10/02/17 1257 10/02/17 1843   10/02/17 1300  piperacillin-tazobactam (ZOSYN) IVPB 3.375 g     3.375 g 100 mL/hr over 30 Minutes Intravenous  Once 10/02/17 1257 10/02/17 1352      Assessment/Plan: s/p Procedure(s): ENDOSCOPIC RETROGRADE CHOLANGIOPANCREATOGRAPHY (ERCP)  Abdominal pain, fever ,nausea, vomiting, diarrhea.  Acute illness. -Given imaging findings and change in physical exam, I believe that the most likely diagnosis is acute cholecystitis -Altered mental status makes me suspect that he is still septic. -There may be a component of cholangitis -Because of his acute abdominal pain is not entirely clear, however -Given his altered mental status, possibly developing  pneumonia, elevated creatinine I think he is too high risk for cholecystectomy -I have requested stat percutaneous cholecystostomy this morning -I will talk with anesthesia and radiology.  I suspect he may have to be intubated to cooperate with the procedure and taken to ICU postop -We will need to proceed with CT scan abdomen and pelvis today to rule out other etiologies. -I have discussed his care plan with Dr. Markus Daft, the anesthesia department, and Dr. Silverio Decamp -Very long talk with family regarding indications and risks of these procedures.  Question cholangitis. -Defer to GI as to timing of ERCP  Question pneumonia. -2 view chest x-ray ordered  Sepsis.  Likely GI source.  Elevated creatinine.  This appears to be acute renal failure as creatinine was 1.1 on 07/09/2017.  History DM 2 History hypertension History BPH     LOS: 2 days    Adin Hector 10/04/2017

## 2017-10-04 NOTE — Sedation Documentation (Signed)
Pt sedated with propofol. Will not given additional sedation unless patient condition warrants. MD aware. Will continue to monitor. Roselyn Reef Alden Bensinger,RN

## 2017-10-04 NOTE — Consult Note (Signed)
Chief Complaint: Patient was seen in consultation today for percutaneous cholecystostomy Chief Complaint  Patient presents with  . Abdominal Pain    Referring Physician(s): Ingram,H  Supervising Physician: Markus Daft  [facility]  History of Present Illness: Timothy Chapman is a 72 y.o. male with history of diabetes, hypertension, obesity, GERD, sleep apnea recently admitted to [facility] on 1/16 with several day history of fever, chills, abdominal pain, nausea and vomiting.  Subsequent labs revealed elevated creatinine, elevated LFTs.  Plain abdominal films revealed bowel gas pattern suggesting small bowel obstruction or small bowel ileus.  No free air.  Ultrasound abdomen revealed small polyps versus adherent gallstones with pericholecystic fluid.  Common bile duct was mildly dilated and Murphy sign was negative sonographically.  MRCP was markedly motion degraded due to patient inability to hold breath.  There were tiny gallstones noted in the dependent gallbladder lumen with trace pericholecystic fluid, no dilatation of the main pancreatic duct 20 mm cystic lesion in the posterior pancreatic head.  Nuclear medicine hepatobiliary study yesterday revealed patent common bile duct but cystic duct patency cannot be assessed due to patient agitation.  Today patient has altered mental status and is extremely restless.  He is currently being intubated.  He is a poor surgical candidate for cholecystectomy at this time and request now received from surgery for percutaneous cholecystostomy.   Past Medical History:  Diagnosis Date  . Anxiety   . Arthritis   . Depression   . Diabetes mellitus without complication ([place])   . GERD (gastroesophageal reflux disease)   . Hypertension   . Neuromuscular disorder ([place])   . Sleep apnea     Past Surgical History:  Procedure Laterality Date  . BACK SURGERY     X2  . REPLACEMENT TOTAL KNEE Left   . ROTATOR CUFF REPAIR       Allergies: Codeine  Medications: Prior to Admission medications   Medication Sig Start Date End Date Taking? Authorizing Provider  Alpha-Lipoic Acid 600 MG CAPS Take 600 mg by mouth daily.   Yes [provider]  benazepril (LOTENSIN) 20 MG tablet Take 1 tablet (20 mg total) by mouth daily. 07/08/17  Yes Copland, Gay Filler, MD  diclofenac (VOLTAREN) 75 MG EC tablet Take 1 tablet (75 mg total) by mouth 2 (two) times daily. Use as needed for joint pains 07/19/17  Yes Copland, Gay Filler, MD  glipiZIDE (GLUCOTROL) 10 MG tablet Take 1 tablet (10 mg total) by mouth 2 (two) times daily before a meal. 07/08/17  Yes Copland, Gay Filler, MD  hydrochlorothiazide (HYDRODIURIL) 25 MG tablet Take 0.5 tablets (12.5 mg total) by mouth daily. 07/08/17  Yes Copland, Gay Filler, MD  Melatonin 3 MG TABS Take 1 tablet by mouth at bedtime.    Yes [provider]  Multiple Vitamin (MULTIVITAMIN) tablet Take 1 tablet by mouth daily.   Yes [provider]  Turmeric 450 MG CAPS Take 1 tablet by mouth daily.    Yes [provider]  Acetylcarnitine HCl 500 MG CAPS Take 500 mg by mouth daily.    [provider]  Alcohol Swabs (ALCOHOL PREP) PADS Use prior to insulin dose 08/22/17   Copland, Jessica C, MD  glucose blood test strip Use as instructed 06/28/16   Copland, Gay Filler, MD  Insulin Pen Needle (PEN NEEDLES) 31G X 5 MM MISC 1 each by Does not apply route daily. 08/22/17   Copland, Gay Filler, MD  liraglutide (Ephrata)  18 MG/3ML SOPN Inject 1.8 mg [place] daily 07/08/17   Copland, Gay Filler, MD  metFORMIN (GLUCOPHAGE) 1000 MG tablet Take 1 tablet (1,000 mg total) by mouth 2 (two) times daily with a meal. 07/08/17   Copland, Gay Filler, MD  niacin (NIASPAN) 500 MG CR tablet Take 1 tablet (500 mg total) by mouth at bedtime. Patient not taking: Reported on 10/02/2017 02/08/16   Copland, Gay Filler, MD  omeprazole (PRILOSEC) 40 MG capsule Take 1 capsule (40 mg total) by mouth  daily. Patient taking differently: Take 40 mg by mouth daily as needed (heartburn).  07/08/17   Copland, Gay Filler, MD  tadalafil (CIALIS) 5 MG tablet Take 1 tablet (5 mg total) daily as needed by mouth for erectile dysfunction (and BPH). 07/24/17   Copland, Gay Filler, MD     Family History  Problem Relation Age of Onset  . Heart failure Father   . Heart disease Father   . Emphysema Mother     Social History   Socioeconomic History  . Marital status: Divorced    Spouse name: None  . Number of children: None  . Years of education: None  . Highest education level: None  Social Needs  . Financial resource strain: None  . Food insecurity - worry: None  . Food insecurity - inability: None  . Transportation needs - medical: None  . Transportation needs - non-medical: None  Occupational History  . None  Tobacco Use  . Smoking status: Former Smoker    Last attempt to quit: 09/18/1975    Years since quitting: 42.0  . Smokeless tobacco: Never Used  Substance and Sexual Activity  . Alcohol use: Yes    Alcohol/week: 0.0 oz    Comment: Rarely  . Drug use: None  . Sexual activity: None  Other Topics Concern  . None  Social History Narrative  . None      Review of Systems see above  Vital Signs: BP (!) 181/78 (BP Location: Right Arm) Comment: MD notified  Pulse 81   Temp 98.9 F (37.2 C) (Oral)   Resp (!) 22   Ht 5\' 9"  (1.753 m)   Wt 243 lb 3.2 oz (110.3 kg)   SpO2 98%   BMI 35.91 kg/m   Physical Exam patient currently being intubated.  Chest with bibasilar crackles.  Heart with tachycardic but regular rhythm; abdomen slightly distended, moderate tenderness and guarding right upper quadrant. Bilateral LE edema noted Imaging: Nm Hepatobiliary Liver Func  Result Date: 10/03/2017 CLINICAL DATA:  Clinical concern for cystic duct obstruction. EXAM: NUCLEAR MEDICINE HEPATOBILIARY IMAGING TECHNIQUE: Sequential images of the abdomen were obtained out to 60 minutes following  intravenous administration of radiopharmaceutical. RADIOPHARMACEUTICALS:  5.5 millicurie mCi [id number]  Choletec IV COMPARISON:  Ultrasound exam from yesterday. MRI from earlier today. FINDINGS: The patient was unable to complete the exam and terminated the study after 25 minutes of imaging. During this 25 minutes, there is brisk uptake of radiotracer by the hepatic parenchyma with rapid normal clearance of blood pool activity. Common duct activity is visible by 15 minutes and gut activity is visible by 20 minutes. No gallbladder filling observed during this initial 25 minutes of imaging. IMPRESSION: 1. Patent common bile duct. 2. Cystic duct patency cannot be assessed on this study as patient terminated the exam before it was complete. Electronically Signed   By: Misty Stanley M.D.   On: 10/03/2017 19:36   Mr Abdomen Mrcp Wo Contrast  Result Date: 10/03/2017 CLINICAL DATA:  Jaundice with abdominal pain. Gallstones noted on recent ultrasound exam. EXAM: MRI ABDOMEN WITHOUT CONTRAST  (INCLUDING MRCP) TECHNIQUE: Multiplanar multisequence MR imaging of the abdomen was performed. Heavily T2-weighted images of the biliary and pancreatic ducts were obtained, and three-dimensional MRCP images were rendered by post processing. COMPARISON:  Ultrasound exam 10/02/2017 FINDINGS: Study is markedly motion degraded due to patient inability to reproducibly breath hold. Lower chest: Heart size upper normal. Small bilateral pleural effusions. Hepatobiliary: Liver measures 24.3 cm craniocaudal length, enlarged. Diffuse fatty deposition noted within the liver parenchyma. Tiny stones are seen dependently in the lumen of the gallbladder (images 30 and 31 of series 13). There is some debris or possible stones in the fundus of the gallbladder. Trace peri cholecystic fluid evident. No substantial intrahepatic biliary duct dilatation. The common duct and common bile ducts are not well evaluated due to the substantial motion artifact.  Extrahepatic common duct measures 7 mm diameter where visualized on axial single shot T2 weighted imaging. Common bile duct in the head of the pancreas is 7 mm diameter although incompletely visualized. There is some central flow artifact within the common bile duct on the axial T2 imaging. Pancreas: No dilatation of the main duct. 17 x 11 x 20 mm cystic lesion identified in the posterior pancreatic head. Susceptibility artifact in the head of the pancreas on multiple sequences may be related to gas and duodenal diverticuli or parenchymal calcification in the pancreatic head. Spleen:  No splenomegaly. No focal mass lesion. Adrenals/Urinary Tract: No adrenal nodule or mass. 4.1 cm simple appearing cyst identified posterior right kidney. No overtly suspicious lesion identified in either kidney on this noncontrast exam. Stomach/Bowel: Stomach is nondistended. Duodenum is normally positioned as is the ligament of Treitz. No small bowel or colonic dilatation within the visualized abdomen. Vascular/Lymphatic: No abdominal aortic aneurysm. Signal void within the portal vein suggests patency. No abdominal lymphadenopathy. Other:  Trace fluid is noted at the inferior liver. Musculoskeletal: No abnormal marrow signal within the visualized bony anatomy. IMPRESSION: 1. Markedly motion degraded study due to patient inability to reproducibly breath hold. Common duct and common bile duct are incompletely evaluated. Where visualized, the extrahepatic biliary tree measures up to 7 mm diameter. Portions of the common bile duct entering and tracking through the head of pancreas are not visualized. 2. Tiny gallstones noted in the dependent gallbladder lumen. There is trace pericholecystic fluid. Debris is noted in the gallbladder fundus. 3. No dilatation of the main pancreatic duct with a 7 x 11 x 20 mm cystic lesion in the posterior pancreatic head showing no definite communication to the main duct but motion artifact limits  assessment. Repeat MRI in 6 months recommended to re-evaluate. This recommendation follows ACR consensus guidelines: Management of Incidental Pancreatic Cysts: A White Paper of the ACR Incidental Findings Committee. Why 7510;[id number]. 4. Hepatic steatosis with hepatomegaly. 5. Small bilateral pleural effusions. Electronically Signed   By: Misty Stanley M.D.   On: 10/03/2017 16:04   Dg Abdomen Acute W/chest  Result Date: 10/02/2017 CLINICAL DATA:  72 year old male with abdominal pain, distention, vomiting, diarrhea. Symptoms for 3 days. EXAM: DG ABDOMEN ACUTE W/ 1V CHEST COMPARISON:  Chest radiograph 04/11/2007. Lumbar radiographs 02/11/2014. FINDINGS: Lung volumes are stable and within normal limits. Normal cardiac size and mediastinal contours. Visualized tracheal air column is within normal limits. No pneumothorax or pneumoperitoneum. No abnormal pulmonary opacity. Multiple upper limits of normal to abnormally dilated gas containing small bowel loops in the mid abdomen, up  to 4.5-5 centimeters diameter. Paucity of large bowel gas. Other abdominal and pelvic visceral contours appear normal. There are surgical clips about the pubic symphysis which are new since 2,015. Degenerative changes in the spine. No acute osseous abnormality identified. IMPRESSION: 1. Bowel-gas pattern suggesting a small bowel obstruction or small bowel ileus. No free air. 2.  No acute cardiopulmonary abnormality. Electronically Signed   By: Genevie Ann M.D.   On: 10/02/2017 10:18   US Abdomen Limited Ruq  Result Date: 10/02/2017 CLINICAL DATA:  Abdominal pain EXAM: ULTRASOUND ABDOMEN LIMITED RIGHT UPPER QUADRANT COMPARISON:  None. FINDINGS: Gallbladder: 2 small echogenic foci in the gallbladder do not move or shadow and could be polyps or stones. Gallbladder wall 2 mm. Mild pericholecystic fluid. Negative sonographic Murphy sign Common bile duct: Diameter: Mildly prominent 8 mm Liver: Echogenic liver difficult to image  without focal abnormality. Portal vein is patent on color Doppler imaging with normal direction of blood flow towards the liver. IMPRESSION: Small polyps versus adherent gallstones. Pericholecystic fluid. Negative sonographic Murphy sign. Common bile duct mildly dilated 8 mm. Fatty liver Electronically Signed   By: Franchot Gallo M.D.   On: 10/02/2017 13:05    Labs:  CBC: Recent Labs    10/02/17 0948 10/03/17 0443 10/03/17 2340 10/04/17 0556  WBC 10.4 10.7* 9.6 10.8*  HGB 12.3* 11.1* 9.9* 10.3*  HCT 36.3* 32.4* 29.7* 31.0*  PLT 238 192 207 226    COAGS: Recent Labs    10/04/17 0412  INR 1.04    BMP: Recent Labs    07/09/17 0826 10/02/17 0948 10/03/17 0443 10/04/17 0412  NA 139 133* 135 138  K 4.9 4.3 4.2 4.0  CL 102 100* 110 112*  CO2 29 19* 16* 17*  GLUCOSE 169* 222* 173* 207*  BUN 27* 47* 48* 44*  CALCIUM 9.9 9.1 7.8* 8.4*  CREATININE 1.11 3.42* 2.87* 1.63*  GFRNONAA  --  17* 21* 41*  GFRAA  --  20* 24* 48*    LIVER FUNCTION TESTS: Recent Labs    07/09/17 0826 10/02/17 0948 10/03/17 0443 10/04/17 0412  BILITOT 0.4 1.0 1.3* 1.1  AST 27 89* 43* 53*  ALT 43 259* 156* 115*  ALKPHOS 32* 73 56 53  PROT 6.9 7.5 6.6 7.0  ALBUMIN 4.2 3.7 3.2* 3.4*    TUMOR MARKERS: No results for input(s): AFPTM, CEA, CA199, CHROMGRNA in the last 8760 hours.  Assessment and Plan: 72 y.o. male with history of diabetes, hypertension, obesity, GERD, sleep apnea recently admitted to [facility] on 1/16 with several day history of fever, chills, abdominal pain, nausea and vomiting.  Subsequent labs revealed elevated creatinine, elevated LFTs.  Plain abdominal films revealed bowel gas pattern suggesting small bowel obstruction or small bowel ileus.  No free air.  Ultrasound abdomen revealed small polyps versus adherent gallstones with pericholecystic fluid.  Common bile duct was mildly dilated and Murphy sign was negative sonographically.  MRCP was markedly motion degraded  due to patient inability to hold breath.  There were tiny gallstones noted in the dependent gallbladder lumen with trace pericholecystic fluid, no dilatation of the main pancreatic duct 20 mm cystic lesion in the posterior pancreatic head.  Nuclear medicine hepatobiliary study yesterday revealed patent common bile duct but cystic duct patency cannot be assessed due to patient agitation.  Today patient has altered mental status and is extremely restless.  He is currently being intubated.  He is a poor surgical candidate for cholecystectomy at this time and request  now received from surgery for percutaneous cholecystostomy.  Last recorded temperature 102.5, WBC 10.8, hemoglobin 10.3, platelets 226 k, creatinine 1.63, PT 13.5, INR 1.04, T bili 1.1.  Imaging studies have been reviewed by Dr. Anselm Pancoast and case discussed with Drs. Dalbert Batman and Byrum.  Following intubation patient will undergo CT abdomen prior to planned cholecystostomy.  Details/risks of procedure, including but not limited to, internal bleeding, infection, injury to adjacent structures, need for emergent surgery, discussed with patient's daughter with her understanding and consent.  Patient received SQ heparin this morning at 0530.   Thank you for this interesting consult.  I greatly enjoyed meeting Timothy Chapman and look forward to participating in their care.  A copy of this report was sent to the requesting provider on this date.  Electronically Signed: D. Rowe Robert, PA-C 10/04/2017, 9:01 AM   I spent a total of 40 minutes  in face to face in clinical consultation, greater than 50% of which was counseling/coordinating care for percutaneous cholecystostomy

## 2017-10-04 NOTE — Progress Notes (Signed)
Pt intubated at bedside with Dr. Lamonte Sakai present. Verbal orders for the following medications provided and medications administered at the following times per orders: 2 versed 0853 100 fentanyl 0906 20 etomidate 0856 20 etomidate 0901 100 rocuronium 0911  Intubation successful.

## 2017-10-04 NOTE — Significant Event (Signed)
Rapid Response Event Note  Overview: Time Called: 0745(Not called, ICU admission placed, helped to assist ) Arrival Time: 0746 Event Type: Respiratory, Other (Comment)(AMS)  Order was placed for ICU transfer, went to 1445 to assist with transfer. Upon arrival patient had increase WOB, and AMS with restlessness. Staff RN at beside.  Initial Focused Assessment: Neuro: Patient was able to follow simple commands: grip hands, and wiggle toes with ease and with equal strength. Pupils 3+, both side equals, reactive but sluggish to light. Patient was oriented to self and place but not time or situation. Cardiac: ST, HR 113, pulses 1-2+ upon palpation of radial and pedal pulses. BP HTN 189/96 Pulmonary: Patient utilizing accessory muscles to breathe, RR 30-40, O2 Sats 93% on RA. Interventions: Transfer to ICU for intubation  CCM MD aware  Plan of Care (if not transferred):  Event Summary:   at      at          [facility]

## 2017-10-04 NOTE — Progress Notes (Signed)
Initial Nutrition Assessment  DOCUMENTATION CODES:   Obesity unspecified  INTERVENTION:  - Will monitor for plan concerning nutrition-support and provide recommendations/interventions as warranted.   NUTRITION DIAGNOSIS:   Inadequate oral intake related to inability to eat as evidenced by NPO status.  GOAL:   Provide needs based on ASPEN/SCCM guidelines  MONITOR:   Vent status, Weight trends, Labs, Other (Comment)(Plan concerning nutrition support)  REASON FOR ASSESSMENT:   Malnutrition Screening Tool, Ventilator  ASSESSMENT:   72 year old obese gentleman with a history of hypertension, diabetes, obstructive sleep apnea, GERD.  Admitted on 1/16 with abdominal pain, nausea with vomiting, fever and chills for proximally 3 days.  His evaluation revealed a transaminitis, acute renal failure.  Initial x-rays suggested a small bowel obstruction and he was kept n.p.o.  Concern for either a viral hepatitis or cholangitis.  Unfortunately his course been complicated by some respiratory distress and agitation, delirium likely due to his evolving underlying illness.    BMI indicates obesity. OGT in place at this time. GI note from this AM states that MRCP was of poor quality and HIDA scan was incomplete. CT abd/pelvis done earlier today and report states findings of bilateral pleural effusions with overlying atelectasis, gallstones present, concern for acute cholecystitis, OGT is in the stomach. Pt was intubated at about 9:25 AM today.  Cholecystostomy tube placed by IR with 90cc cloudy brown fluid removed at time of placement.   No family/visitors present at this time. On MST screen, pt or another individual had reported a 30 lb weight loss in the past 1 year. Based on chart review, current weight is consistent with weight as far back as 06/20/16.   Patient is currently intubated on ventilator support MV: 14.9 L/min Temp (24hrs), Avg:101.1 F (38.4 C), Min:98.2 F (36.8 C), Max:104.2 F  (40.1 C) Propofol: 29.8 ml/hr (787 kcal) BP: 124/70 and MAP: 86  Medications reviewed; 20 mg IV Pepcid BID, sliding scale Novolog, 1 tablet Senokot BID.  Labs reviewed; CBGs: 175, 218, 199 mg/dL today, Cl: 112 mmol/L, BUN: 44 mg/dL, creatinine: 1.63 mg/dL, Ca: 8.4 mg/dL, LFTs elevated, GFR: 41 mL/min.    IVF: NS @ 30 mL/hr.  Drip: Propofol @ 45 mcg/kg/min.   NUTRITION - FOCUSED PHYSICAL EXAM:  Completed/assessed with no fat or muscle wasting noted, no edema.  Diet Order:  Diet NPO time specified  EDUCATION NEEDS:   No education needs have been identified at this time  Skin:  Skin Assessment: Reviewed RN Assessment  Last BM:  1/18  Height:   Ht Readings from Last 1 Encounters:  10/04/17 5\' 9"  (1.753 m)    Weight:   Wt Readings from Last 1 Encounters:  10/04/17 243 lb 9.7 oz (110.5 kg)    Ideal Body Weight:  72.73 kg  BMI:  Body mass index is 35.97 kg/m.  Estimated Nutritional Needs:   Kcal:  4665-9935   Protein:  145 grams  Fluid:  >/= 2 L/day     Jarome Matin, MS, RD, LDN, [facility] Inpatient Clinical Dietitian Pager # [phone] After hours/weekend pager # [phone]

## 2017-10-04 NOTE — Consult Note (Signed)
PULMONARY / CRITICAL CARE MEDICINE   Name: Timothy Chapman MRN: [id number] DOB: October 14, 1945    ADMISSION DATE:  10/02/2017 CONSULTATION DATE: 10/04/17  REFERRING MD:  Dr Sloan Leiter, [facility]  Reason for consultation: Agitation, tachypnea, in the setting of acute abdomen  HISTORY OF PRESENT ILLNESS:   72 year old obese gentleman with a history of hypertension, diabetes, obstructive sleep apnea, GERD.  Admitted on 1/16 with abdominal pain, nausea with vomiting, fever and chills for proximally 3 days.  His evaluation revealed a transaminitis, acute renal failure.  Initial x-rays suggested a small bowel obstruction and he was kept n.p.o.  Concern for either a viral hepatitis or cholangitis.  Unfortunately his course been complicated by some respiratory distress and agitation, delirium likely due to his evolving underlying illness.  Imaging has not been definitive- HIDA scan did not show occlusion of the common bile duct, MRCP was a poor study but the common bile duct was 7 mm in size.  Transaminases and renal function slightly improved but patient overall clinically worsening.  He moved to the ICU for stabilization in order to facilitate next steps including better abdominal imaging, possible cholecystostomy or surgical procedure.  PAST MEDICAL HISTORY :  He  has a past medical history of Anxiety, Arthritis, Depression, Diabetes mellitus without complication ([place]), GERD (gastroesophageal reflux disease), Hypertension, Neuromuscular disorder ([place]), and Sleep apnea.  PAST SURGICAL HISTORY: He  has a past surgical history that includes Rotator cuff repair; Back surgery; and Replacement total knee (Left).  Allergies  Allergen Reactions  . Codeine     No current facility-administered medications on file prior to encounter.    Current Outpatient Medications on File Prior to Encounter  Medication Sig  . Alpha-Lipoic Acid 600 MG CAPS Take 600 mg by mouth daily.  . benazepril (LOTENSIN) 20 MG tablet Take 1  tablet (20 mg total) by mouth daily.  . diclofenac (VOLTAREN) 75 MG EC tablet Take 1 tablet (75 mg total) by mouth 2 (two) times daily. Use as needed for joint pains  . glipiZIDE (GLUCOTROL) 10 MG tablet Take 1 tablet (10 mg total) by mouth 2 (two) times daily before a meal.  . hydrochlorothiazide (HYDRODIURIL) 25 MG tablet Take 0.5 tablets (12.5 mg total) by mouth daily.  . Melatonin 3 MG TABS Take 1 tablet by mouth at bedtime.   . Multiple Vitamin (MULTIVITAMIN) tablet Take 1 tablet by mouth daily.  . Turmeric 450 MG CAPS Take 1 tablet by mouth daily.   . Acetylcarnitine HCl 500 MG CAPS Take 500 mg by mouth daily.  . Alcohol Swabs (ALCOHOL PREP) PADS Use prior to insulin dose  . glucose blood test strip Use as instructed  . Insulin Pen Needle (PEN NEEDLES) 31G X 5 MM MISC 1 each by Does not apply route daily.  Marland Kitchen liraglutide (VICTOZA) 18 MG/3ML SOPN Inject 1.8 mg [place] daily  . metFORMIN (GLUCOPHAGE) 1000 MG tablet Take 1 tablet (1,000 mg total) by mouth 2 (two) times daily with a meal.  . niacin (NIASPAN) 500 MG CR tablet Take 1 tablet (500 mg total) by mouth at bedtime. (Patient not taking: Reported on 10/02/2017)  . omeprazole (PRILOSEC) 40 MG capsule Take 1 capsule (40 mg total) by mouth daily. (Patient taking differently: Take 40 mg by mouth daily as needed (heartburn). )  . tadalafil (CIALIS) 5 MG tablet Take 1 tablet (5 mg total) daily as needed by mouth for erectile dysfunction (and BPH).    FAMILY HISTORY:  His indicated that his mother is deceased.  He indicated that his father is deceased.   SOCIAL HISTORY: He  reports that he quit smoking about 42 years ago. he has never used smokeless tobacco. He reports that he drinks alcohol.  REVIEW OF SYSTEMS:   Uncomfortable, some abd pain, feels that he needs to get up, to urinate.  Tachypnea, dyspnea Some jaundice  SUBJECTIVE:   VITAL SIGNS: BP (!) 181/78 (BP Location: Right Arm) Comment: MD notified  Pulse 81   Temp 98.9 F (37.2  C) (Oral)   Resp (!) 22   Ht 5\' 9"  (1.753 m)   Wt 110.3 kg (243 lb 3.2 oz)   SpO2 98%   BMI 35.91 kg/m   HEMODYNAMICS:    VENTILATOR SETTINGS:    INTAKE / OUTPUT: I/O last 3 completed shifts: In: 6482.8 [I.V.:6282.8; IV Piggyback:200] Out: -   PHYSICAL EXAMINATION: General: Obese man, uncomfortable and somewhat agitated, mild jaundice Neuro: Awake, agitated, able to answer questions but fidgeting and wants to get up, moves all extremities with good strength HEENT: Oropharynx dry, pupils equal Cardiovascular: Tachycardic, regular, distant but no murmur heard, trace bilateral pretibial edema Lungs: Tachypneic, small volumes, decreased at both bases, no wheezing or crackles Abdomen: Obese, somewhat distended and tympanitic, tender to palpation especially in the right upper quadrant but no rebound Musculoskeletal: No deformities Skin: No rash, no breakdown noted  LABS:  BMET Recent Labs  Lab 10/02/17 0948 10/03/17 0443 10/04/17 0412  NA 133* 135 138  K 4.3 4.2 4.0  CL 100* 110 112*  CO2 19* 16* 17*  BUN 47* 48* 44*  CREATININE 3.42* 2.87* 1.63*  GLUCOSE 222* 173* 207*    Electrolytes Recent Labs  Lab 10/02/17 0948 10/03/17 0443 10/04/17 0412  CALCIUM 9.1 7.8* 8.4*    CBC Recent Labs  Lab 10/03/17 0443 10/03/17 2340 10/04/17 0556  WBC 10.7* 9.6 10.8*  HGB 11.1* 9.9* 10.3*  HCT 32.4* 29.7* 31.0*  PLT 192 207 226    Coag's Recent Labs  Lab 10/04/17 0412  INR 1.04    Sepsis Markers Recent Labs  Lab 10/02/17 1106 10/02/17 1323 10/02/17 1549  LATICACIDVEN 1.54 2.48* 1.33    ABG No results for input(s): PHART, PCO2ART, PO2ART in the last 168 hours.  Liver Enzymes Recent Labs  Lab 10/02/17 0948 10/03/17 0443 10/04/17 0412  AST 89* 43* 53*  ALT 259* 156* 115*  ALKPHOS 73 56 53  BILITOT 1.0 1.3* 1.1  ALBUMIN 3.7 3.2* 3.4*    Cardiac Enzymes No results for input(s): TROPONINI, PROBNP in the last 168 hours.  Glucose Recent Labs   Lab 10/03/17 0746 10/03/17 1147 10/03/17 1643 10/03/17 2057 10/04/17 0228 10/04/17 0744  GLUCAP 185* 184* 151* 152* 175* 218*    Imaging Nm Hepatobiliary Liver Func  Result Date: 10/03/2017 CLINICAL DATA:  Clinical concern for cystic duct obstruction. EXAM: NUCLEAR MEDICINE HEPATOBILIARY IMAGING TECHNIQUE: Sequential images of the abdomen were obtained out to 60 minutes following intravenous administration of radiopharmaceutical. RADIOPHARMACEUTICALS:  5.5 millicurie mCi [id number]  Choletec IV COMPARISON:  Ultrasound exam from yesterday. MRI from earlier today. FINDINGS: The patient was unable to complete the exam and terminated the study after 25 minutes of imaging. During this 25 minutes, there is brisk uptake of radiotracer by the hepatic parenchyma with rapid normal clearance of blood pool activity. Common duct activity is visible by 15 minutes and gut activity is visible by 20 minutes. No gallbladder filling observed during this initial 25 minutes of imaging. IMPRESSION: 1. Patent common bile duct. 2. Cystic  duct patency cannot be assessed on this study as patient terminated the exam before it was complete. Electronically Signed   By: Misty Stanley M.D.   On: 10/03/2017 19:36   Mr Abdomen Mrcp Wo Contrast  Result Date: 10/03/2017 CLINICAL DATA:  Jaundice with abdominal pain. Gallstones noted on recent ultrasound exam. EXAM: MRI ABDOMEN WITHOUT CONTRAST  (INCLUDING MRCP) TECHNIQUE: Multiplanar multisequence MR imaging of the abdomen was performed. Heavily T2-weighted images of the biliary and pancreatic ducts were obtained, and three-dimensional MRCP images were rendered by post processing. COMPARISON:  Ultrasound exam 10/02/2017 FINDINGS: Study is markedly motion degraded due to patient inability to reproducibly breath hold. Lower chest: Heart size upper normal. Small bilateral pleural effusions. Hepatobiliary: Liver measures 24.3 cm craniocaudal length, enlarged. Diffuse fatty deposition noted  within the liver parenchyma. Tiny stones are seen dependently in the lumen of the gallbladder (images 30 and 31 of series 13). There is some debris or possible stones in the fundus of the gallbladder. Trace peri cholecystic fluid evident. No substantial intrahepatic biliary duct dilatation. The common duct and common bile ducts are not well evaluated due to the substantial motion artifact. Extrahepatic common duct measures 7 mm diameter where visualized on axial single shot T2 weighted imaging. Common bile duct in the head of the pancreas is 7 mm diameter although incompletely visualized. There is some central flow artifact within the common bile duct on the axial T2 imaging. Pancreas: No dilatation of the main duct. 17 x 11 x 20 mm cystic lesion identified in the posterior pancreatic head. Susceptibility artifact in the head of the pancreas on multiple sequences may be related to gas and duodenal diverticuli or parenchymal calcification in the pancreatic head. Spleen:  No splenomegaly. No focal mass lesion. Adrenals/Urinary Tract: No adrenal nodule or mass. 4.1 cm simple appearing cyst identified posterior right kidney. No overtly suspicious lesion identified in either kidney on this noncontrast exam. Stomach/Bowel: Stomach is nondistended. Duodenum is normally positioned as is the ligament of Treitz. No small bowel or colonic dilatation within the visualized abdomen. Vascular/Lymphatic: No abdominal aortic aneurysm. Signal void within the portal vein suggests patency. No abdominal lymphadenopathy. Other:  Trace fluid is noted at the inferior liver. Musculoskeletal: No abnormal marrow signal within the visualized bony anatomy. IMPRESSION: 1. Markedly motion degraded study due to patient inability to reproducibly breath hold. Common duct and common bile duct are incompletely evaluated. Where visualized, the extrahepatic biliary tree measures up to 7 mm diameter. Portions of the common bile duct entering and tracking  through the head of pancreas are not visualized. 2. Tiny gallstones noted in the dependent gallbladder lumen. There is trace pericholecystic fluid. Debris is noted in the gallbladder fundus. 3. No dilatation of the main pancreatic duct with a 7 x 11 x 20 mm cystic lesion in the posterior pancreatic head showing no definite communication to the main duct but motion artifact limits assessment. Repeat MRI in 6 months recommended to re-evaluate. This recommendation follows ACR consensus guidelines: Management of Incidental Pancreatic Cysts: A White Paper of the ACR Incidental Findings Committee. [place] 9211;[id number]. 4. Hepatic steatosis with hepatomegaly. 5. Small bilateral pleural effusions. Electronically Signed   By: Misty Stanley M.D.   On: 10/03/2017 16:04     STUDIES:  KUB 1/16 >> dilated gas containing small bowel loops, paucity of large bowel gas consistent with a small bowel obstruction or small bowel ileus Right upper quadrant ultrasound 1/16 >> 2 small echogenic foci in the gallbladder, either  stones or possibly polyps.  Mild pericholecystic fluid, negative sonographic Murphy, CBD 8 mm, good portal flow. HIDA scan 1/16 >> incomplete study, good uptake of tracer and rapid normal clearance, no gallbladder filling noted (study terminated early by the patient) MRCP 1/17 >> poor study due to motion, tiny gallstones in the dependent gallbladder lumen, trace pericholecystic fluid and gallbladder debris, no dilation of the main pancreatic duct, the incompletely visualized extrahepatic biliary tree measures up to 7 mm in diameter  CULTURES: Blood 1/16 >> C. Difficile PCR >>   ANTIBIOTICS: Zosyn 1/16 >>   SIGNIFICANT EVENTS: Intubated for stabilization, sedation and procedures 1/18  LINES/TUBES: ETT 1/18 >>   DISCUSSION: 72 year old man with hypertension, diabetes, sleep apnea, GERD.  He presents with abdominal pain, transaminitis and has evolved an acute abdomen.  Unclear as to  whether this reflects small bowel obstruction, bowel injury.  Cholangitis also being considered but compromised radiographical evaluation not entirely consistent.  General surgery and GI following.  He needs definitive imaging in order to plan next steps.  He will not be able to get this as long as he is agitated, tachypneic with compromised respiratory status.  He will be sedated, intubated to facilitate work up and trreatment  ASSESSMENT / PLAN:  PULMONARY A: Acute respiratory failure P:   Sedate and intubate now PRVC 8cc/kg  Daily WUA Assess for SBT once abdominal issues have been addressed  VAP prevention orders  CARDIOVASCULAR A:  Hypertension, likely exacerbated by his abdominal pain, respiratory discomfort P:  Add metoprolol, hydralazine as needed Suspect this will improve also with adequate pain control, sedation  RENAL A:   Acute renal failure, probably ATN, improving P:   Follow BMP, urine output Careful with contrast media and possible nephrotoxins   GASTROINTESTINAL A:   Abdominal pain, evolving acute abdomen Transaminitis, improving SUP P:   Etiology unclear.  Some aspects of his exam and imaging have suggested possible gallbladder disease, but other than consistent with cholangitis.  Studies have been compromised by technique. Once stabilized he would go for dedicated CT scan of the abdomen and pelvis, plan for placement of a percutaneous cholecystostomy tube. Plan to review the CT scan, gallbladder findings with Drs Briant Sites.  He may need abdominal exploration.  If he does have a cholangitis he will at some point need cholecystectomy. Add Pepcid Continue to follow LFT.  Lipase has been normal but will recheck  HEMATOLOGIC A:   Mild leukocytosis Relative anemia, normocytic, likely due to acute illness P:  Follow CBC No evidence of active blood loss  INFECTIOUS A:   Suspect SBO, possible bowel ischemia Possible cholangitis -Viral hepatitis panel  negative -Flu panel negative P:   On empiric Zosyn at this time. Blood cultures pending  ENDOCRINE A:   Diabetes mellitus P:   Change to ICU hyperglycemia protocol  NEUROLOGIC A:   Abdominal pain Sedation for MV P:   RASS goal: -1 Propofol for sedation, hopefully will facilitate a quick wake up once procedures have been done and he is stabilized Watch for elevated triglycerides, any risk for pancreatic inflammation on propofol Fentanyl for pain control   FAMILY  - Updates: discussed acute issues, plans with his daughter at bedside 1/18.   - Inter-disciplinary family meet or Palliative Care meeting due by:  10/11/17  Independent CC time 90 minutes   Baltazar Apo, MD, PhD 10/04/2017, 10:04 AM [facility] [phone] or if no answer [phone]

## 2017-10-05 ENCOUNTER — Inpatient Hospital Stay (HOSPITAL_COMMUNITY): Payer: Medicare HMO

## 2017-10-05 LAB — GLUCOSE, CAPILLARY
GLUCOSE-CAPILLARY: 143 mg/dL — AB (ref 65–99)
GLUCOSE-CAPILLARY: 153 mg/dL — AB (ref 65–99)
GLUCOSE-CAPILLARY: 136 mg/dL — AB (ref 65–99)
Glucose-Capillary: 123 mg/dL — ABNORMAL HIGH (ref 65–99)
Glucose-Capillary: 130 mg/dL — ABNORMAL HIGH (ref 65–99)

## 2017-10-05 LAB — COMPREHENSIVE METABOLIC PANEL
ALBUMIN: 2.6 g/dL — AB (ref 3.5–5.0)
ALK PHOS: 143 U/L — AB (ref 38–126)
ALT: 176 U/L — AB (ref 17–63)
AST: 103 U/L — AB (ref 15–41)
Anion gap: 8 (ref 5–15)
BUN: 40 mg/dL — AB (ref 6–20)
CALCIUM: 7.7 mg/dL — AB (ref 8.9–10.3)
CO2: 16 mmol/L — AB (ref 22–32)
CREATININE: 1.67 mg/dL — AB (ref 0.61–1.24)
Chloride: 112 mmol/L — ABNORMAL HIGH (ref 101–111)
GFR calc Af Amer: 47 mL/min — ABNORMAL LOW (ref >60)
GFR calc non Af Amer: 40 mL/min — ABNORMAL LOW (ref >60)
GLUCOSE: 144 mg/dL — AB (ref 65–99)
Potassium: 3.9 mmol/L (ref 3.5–5.1)
SODIUM: 136 mmol/L (ref 135–145)
Total Bilirubin: 3.4 mg/dL — ABNORMAL HIGH (ref 0.3–1.2)
Total Protein: 6.2 g/dL — ABNORMAL LOW (ref 6.5–8.1)

## 2017-10-05 LAB — CBC
HEMATOCRIT: 31 % — AB (ref 39.0–52.0)
HEMOGLOBIN: 10.2 g/dL — AB (ref 13.0–17.0)
MCH: 28.3 pg (ref 26.0–34.0)
MCHC: 32.9 g/dL (ref 30.0–36.0)
MCV: 85.9 fL (ref 78.0–100.0)
Platelets: 167 10*3/uL (ref 150–400)
RBC: 3.61 MIL/uL — AB (ref 4.22–5.81)
RDW: 14.2 % (ref 11.5–15.5)
WBC: 7.5 10*3/uL (ref 4.0–10.5)

## 2017-10-05 LAB — LIPASE, BLOOD: Lipase: 19 U/L (ref 11–51)

## 2017-10-05 MED ORDER — CHLORHEXIDINE GLUCONATE 0.12% ORAL RINSE (MEDLINE KIT)
15.0000 mL | Freq: Two times a day (BID) | OROMUCOSAL | Status: DC
  Administered 2017-10-05 – 2017-10-06 (×3): 15 mL via OROMUCOSAL

## 2017-10-05 MED ORDER — HYDRALAZINE HCL 20 MG/ML IJ SOLN
10.0000 mg | Freq: Four times a day (QID) | INTRAMUSCULAR | Status: DC | PRN
  Administered 2017-10-06: 10 mg via INTRAVENOUS
  Filled 2017-10-05: qty 1

## 2017-10-05 MED ORDER — ORAL CARE MOUTH RINSE
15.0000 mL | Freq: Four times a day (QID) | OROMUCOSAL | Status: DC
  Administered 2017-10-05 – 2017-10-06 (×4): 15 mL via OROMUCOSAL

## 2017-10-05 MED ORDER — DEXTROSE IN LACTATED RINGERS 5 % IV SOLN
INTRAVENOUS | Status: DC
  Administered 2017-10-05 – 2017-10-07 (×3): via INTRAVENOUS

## 2017-10-05 MED ORDER — LACTATED RINGERS IV BOLUS (SEPSIS)
1000.0000 mL | Freq: Once | INTRAVENOUS | Status: AC
  Administered 2017-10-05: 1000 mL via INTRAVENOUS

## 2017-10-05 MED ORDER — METOPROLOL TARTRATE 5 MG/5ML IV SOLN
5.0000 mg | INTRAVENOUS | Status: AC | PRN
  Administered 2017-10-05: 5 mg via INTRAVENOUS
  Filled 2017-10-05: qty 5

## 2017-10-05 NOTE — Progress Notes (Signed)
PULMONARY / CRITICAL CARE MEDICINE   Name: Timothy Chapman MRN: [id number] DOB: 08-03-1946    ADMISSION DATE:  10/02/2017 CONSULTATION DATE: 10/04/17  REFERRING MD:  Dr Sloan Leiter, [facility]  Reason for consultation: Agitation, tachypnea, in the setting of acute abdomen   BRIEF  72 year old obese gentleman with a history of hypertension, diabetes, obstructive sleep apnea, GERD.  Admitted on 1/16 with abdominal pain, nausea with vomiting, fever and chills for proximally 3 days.  His evaluation revealed a transaminitis, acute renal failure.  Initial x-rays suggested a small bowel obstruction and he was kept n.p.o.  Concern for either a viral hepatitis or cholangitis.  Unfortunately his course been complicated by some respiratory distress and agitation, delirium likely due to his evolving underlying illness.  Imaging has not been definitive- HIDA scan did not show occlusion of the common bile duct, MRCP was a poor study but the common bile duct was 7 mm in size.  Transaminases and renal function slightly improved but patient overall clinically worsening.  He moved to the ICU for stabilization in order to facilitate next steps including better abdominal imaging, possible cholecystostomy or surgical procedure.    PAST MEDICAL HISTORY :  He  has a past medical history of AKI (acute kidney injury) ([place]), Anxiety, Arthritis, Depression, Diabetes mellitus without complication ([place]), GERD (gastroesophageal reflux disease), Hypertension, Neuromuscular disorder ([place]), and Sleep apnea.  PAST SURGICAL HISTORY: He  has a past surgical history that includes Rotator cuff repair; Back surgery; and Replacement total knee (Left).     STUDIES:  KUB 1/16 >> dilated gas containing small bowel loops, paucity of large bowel gas consistent with a small bowel obstruction or small bowel ileus Right upper quadrant ultrasound 1/16 >> 2 small echogenic foci in the gallbladder, either stones or possibly polyps.  Mild  pericholecystic fluid, negative sonographic Murphy, CBD 8 mm, good portal flow. HIDA scan 1/16 >> incomplete study, good uptake of tracer and rapid normal clearance, no gallbladder filling noted (study terminated early by the patient) MRCP 1/17 >> poor study due to motion, tiny gallstones in the dependent gallbladder lumen, trace pericholecystic fluid and gallbladder debris, no dilation of the main pancreatic duct, the incompletely visualized extrahepatic biliary tree measures up to 7 mm in diameter  CULTURES: Blood 1/16 >> C. Difficile PCR >>   ANTIBIOTICS: Zosyn 1/16 >>   SIGNIFICANT EVENTS: Intubated for stabilization, sedation and procedures 1/18  LINES/TUBES: ETT 1/18 >>     SUBJECTIVE/OVERNIGHT/INTERVAL HX 10/05/2017 - urine cloudy. Not on pressors. On diprivan being weaned. On SBT /s/p cholecystostomy yesterday by IR.  Got very tachpneic when diprivan stopped while on SBT  VITAL SIGNS: BP (!) 109/55   Pulse 82   Temp (!) 100.9 F (38.3 C)   Resp (!) 26   Ht 5\' 9"  (1.753 m)   Wt 107.7 kg (237 lb 7 oz)   SpO2 100%   BMI 35.06 kg/m   HEMODYNAMICS:    VENTILATOR SETTINGS: Vent Mode: PRVC FiO2 (%):  [30 %-100 %] 30 % Set Rate:  [16 bmp-18 bmp] 16 bmp Vt Set:  [570 mL-600 mL] 570 mL PEEP:  [5 cmH20] 5 cmH20 Plateau Pressure:  [17 cmH20-29 cmH20] 19 cmH20  INTAKE / OUTPUT: I/O last 3 completed shifts: In: 6873.2 [I.V.:6573.2; IV Piggyback:300] Out: 1685 [Urine:1635; Drains:50]  PHYSICAL EXAMINATION:  General Appearance:    Looks criticall ill OBESE - yes3  Head:    Normocephalic, without obvious abnormality, atraumatic  Eyes:    PERRL - yes, conjunctiva/corneas -  clear      Ears:    Normal external ear canals, both ears  Nose:   NG tube - no  Throat:  ETT TUBE - yes , OG tube - yes  Neck:   Supple,  No enlargement/tenderness/nodules     Lungs:     Clear to auscultation bilaterally, Ventilator   Synchrony - yes  Chest wall:    No deformity  Heart:    S1 and  S2 normal, no murmur, CVP - no.  Pressors - no  Abdomen:     Soft, no masses, no organomegaly  Genitalia:    Not done  Rectal:   not done  Extremities:   Extremities- intact     Skin:   Intact in exposed areas . Sacral area - no decub per rn     Neurologic:   Sedation - dioprivan gtt -> RASS - -1 . Moves all 4s - yes. CAM-ICU - na . Orientation - na     PULMONARY Recent Labs  Lab 10/04/17 1250  PHART 7.358  PCO2ART 30.1*  PO2ART 401*  HCO3 16.1*  O2SAT 99.7    CBC Recent Labs  Lab 10/03/17 2340 10/04/17 0556 10/05/17 0300  HGB 9.9* 10.3* 10.2*  HCT 29.7* 31.0* 31.0*  WBC 9.6 10.8* 7.5  PLT 207 226 167    COAGULATION Recent Labs  Lab 10/04/17 0412  INR 1.04    CARDIAC  No results for input(s): TROPONINI in the last 168 hours. No results for input(s): PROBNP in the last 168 hours.   CHEMISTRY Recent Labs  Lab 10/02/17 0948 10/03/17 0443 10/04/17 0412 10/04/17 2149 10/05/17 0300  NA 133* 135 138 139 136  K 4.3 4.2 4.0 3.7 3.9  CL 100* 110 112* 115* 112*  CO2 19* 16* 17* 16* 16*  GLUCOSE 222* 173* 207* 145* 144*  BUN 47* 48* 44* 40* 40*  CREATININE 3.42* 2.87* 1.63* 1.65* 1.67*  CALCIUM 9.1 7.8* 8.4* 8.1* 7.7*   Estimated Creatinine Clearance: 50.5 mL/min (A) (by C-G formula based on SCr of 1.67 mg/dL (H)).   LIVER Recent Labs  Lab 10/02/17 0948 10/03/17 0443 10/04/17 0412 10/05/17 0300  AST 89* 43* 53* 103*  ALT 259* 156* 115* 176*  ALKPHOS 73 56 53 143*  BILITOT 1.0 1.3* 1.1 3.4*  PROT 7.5 6.6 7.0 6.2*  ALBUMIN 3.7 3.2* 3.4* 2.6*  INR  --   --  1.04  --      INFECTIOUS Recent Labs  Lab 10/02/17 1106 10/02/17 1323 10/02/17 1549  LATICACIDVEN 1.54 2.48* 1.33     ENDOCRINE CBG (last 3)  Recent Labs    10/04/17 1931 10/04/17 2334 10/05/17 0328  GLUCAP 149* 129* 143*         IMAGING x48h  - image(s) personally visualized  -   highlighted in bold Ct Abdomen Pelvis Wo Contrast  Result Date: 10/04/2017 CLINICAL  DATA:  Fever, chills, abdominal pain, nausea and vomiting. Elevated liver function studies. EXAM: CT ABDOMEN AND PELVIS WITHOUT CONTRAST TECHNIQUE: Multidetector CT imaging of the abdomen and pelvis was performed following the standard protocol without IV contrast. COMPARISON:  MRI abdomen 10/03/2017 FINDINGS: Lower chest: Small to moderate-sized bilateral pleural effusions with overlying atelectasis. Mild cardiac enlargement. Coronary artery calcifications. There is an NG tube coursing down the esophagus and into the stomach. Hepatobiliary: The gallbladder is distended and contains gallstones. Surrounding inflammatory changes suspicious for acute cholecystitis. Mild common bile duct dilatation with maximum diameter of 8.5 mm. No obvious common  bile duct stones. Pancreas: No mass, inflammation or ductal dilatation. Duodenum diverticulum noted near the pancreatic head. Spleen: Normal size.  No focal lesions. Adrenals/Urinary Tract: The adrenal glands and kidneys are unremarkable. Right renal cyst is noted. The bladder contains a Foley catheter. Stomach/Bowel: The stomach, duodenum, small bowel and colon are grossly normal without oral contrast. 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 There is an NG tube in the stomach. Vascular/Lymphatic: Scattered aortic calcifications but no aneurysm. Small scattered mesenteric and retroperitoneal lymph nodes but no mass or adenopathy. Reproductive: The prostate gland contains brachytherapy seeds. There is a Foley catheter in the bladder. Seminal vesicles appear normal. Other: Moderate free pelvic fluid and mild free abdominal fluid. This measures as simple fluid. No pelvic mass or adenopathy. No inguinal mass or hernia. Musculoskeletal: Advanced degenerative changes involving the spine. Severe disc disease at L2-3. No acute bony findings. IMPRESSION: 1. CT findings most suggestive of acute cholecystitis. Right upper quadrant inflammatory process could also be due to duodenitis or  pancreatitis but I think both of these are much less likely. 2. Bilateral pleural effusions and overlying atelectasis. 3. Small amount of free abdominal fluid and moderate amount of free pelvic fluid. Electronically Signed   By: Marijo Sanes M.D.   On: 10/04/2017 12:41   Nm Hepatobiliary Liver Func  Result Date: 10/03/2017 CLINICAL DATA:  Clinical concern for cystic duct obstruction. EXAM: NUCLEAR MEDICINE HEPATOBILIARY IMAGING TECHNIQUE: Sequential images of the abdomen were obtained out to 60 minutes following intravenous administration of radiopharmaceutical. RADIOPHARMACEUTICALS:  5.5 millicurie mCi YT-03T  Choletec IV COMPARISON:  Ultrasound exam from yesterday. MRI from earlier today. FINDINGS: The patient was unable to complete the exam and terminated the study after 25 minutes of imaging. During this 25 minutes, there is brisk uptake of radiotracer by the hepatic parenchyma with rapid normal clearance of blood pool activity. Common duct activity is visible by 15 minutes and gut activity is visible by 20 minutes. No gallbladder filling observed during this initial 25 minutes of imaging. IMPRESSION: 1. Patent common bile duct. 2. Cystic duct patency cannot be assessed on this study as patient terminated the exam before it was complete. Electronically Signed   By: Misty Stanley M.D.   On: 10/03/2017 19:36   Mr Abdomen Mrcp Wo Contrast  Result Date: 10/03/2017 CLINICAL DATA:  Jaundice with abdominal pain. Gallstones noted on recent ultrasound exam. EXAM: MRI ABDOMEN WITHOUT CONTRAST  (INCLUDING MRCP) TECHNIQUE: Multiplanar multisequence MR imaging of the abdomen was performed. Heavily T2-weighted images of the biliary and pancreatic ducts were obtained, and three-dimensional MRCP images were rendered by post processing. COMPARISON:  Ultrasound exam 10/02/2017 FINDINGS: Study is markedly motion degraded due to patient inability to reproducibly breath hold. Lower chest: Heart size upper normal. Small  bilateral pleural effusions. Hepatobiliary: Liver measures 24.3 cm craniocaudal length, enlarged. Diffuse fatty deposition noted within the liver parenchyma. Tiny stones are seen dependently in the lumen of the gallbladder (images 30 and 31 of series 13). There is some debris or possible stones in the fundus of the gallbladder. Trace peri cholecystic fluid evident. No substantial intrahepatic biliary duct dilatation. The common duct and common bile ducts are not well evaluated due to the substantial motion artifact. Extrahepatic common duct measures 7 mm diameter where visualized on axial single shot T2 weighted imaging. Common bile duct in the head of the pancreas is 7 mm diameter although incompletely visualized. There is some central flow artifact within the common bile duct on the axial T2 imaging. Pancreas: No  dilatation of the main duct. 17 x 11 x 20 mm cystic lesion identified in the posterior pancreatic head. Susceptibility artifact in the head of the pancreas on multiple sequences may be related to gas and duodenal diverticuli or parenchymal calcification in the pancreatic head. Spleen:  No splenomegaly. No focal mass lesion. Adrenals/Urinary Tract: No adrenal nodule or mass. 4.1 cm simple appearing cyst identified posterior right kidney. No overtly suspicious lesion identified in either kidney on this noncontrast exam. Stomach/Bowel: Stomach is nondistended. Duodenum is normally positioned as is the ligament of Treitz. No small bowel or colonic dilatation within the visualized abdomen. Vascular/Lymphatic: No abdominal aortic aneurysm. Signal void within the portal vein suggests patency. No abdominal lymphadenopathy. Other:  Trace fluid is noted at the inferior liver. Musculoskeletal: No abnormal marrow signal within the visualized bony anatomy. IMPRESSION: 1. Markedly motion degraded study due to patient inability to reproducibly breath hold. Common duct and common bile duct are incompletely evaluated.  Where visualized, the extrahepatic biliary tree measures up to 7 mm diameter. Portions of the common bile duct entering and tracking through the head of pancreas are not visualized. 2. Tiny gallstones noted in the dependent gallbladder lumen. There is trace pericholecystic fluid. Debris is noted in the gallbladder fundus. 3. No dilatation of the main pancreatic duct with a 7 x 11 x 20 mm cystic lesion in the posterior pancreatic head showing no definite communication to the main duct but motion artifact limits assessment. Repeat MRI in 6 months recommended to re-evaluate. This recommendation follows ACR consensus guidelines: Management of Incidental Pancreatic Cysts: A White Paper of the ACR Incidental Findings Committee. [place] [id number]. 4. Hepatic steatosis with hepatomegaly. 5. Small bilateral pleural effusions. Electronically Signed   By: Misty Stanley M.D.   On: 10/03/2017 16:04   Dg Chest Port 1 View  Result Date: 10/05/2017 CLINICAL DATA:  Hypoxia EXAM: PORTABLE CHEST 1 VIEW COMPARISON:  October 04, 2017 FINDINGS: Endotracheal tube tip is 3.2 cm above the carina. Nasogastric tube tip and side port are below the diaphragm. No pneumothorax. There is atelectatic change in the left base with small left pleural effusion. Lungs elsewhere are clear. Heart is mildly enlarged with pulmonary vascularity within normal limits. No adenopathy. No bone lesions. IMPRESSION: Tube positions as described without pneumothorax. Left base atelectasis with small left pleural effusion. Lungs elsewhere clear. Stable cardiac silhouette. Electronically Signed   By: Lowella Grip III M.D.   On: 10/05/2017 07:15   Dg Chest Port 1 View  Result Date: 10/04/2017 CLINICAL DATA:  Endotracheal and orogastric tube placement. EXAM: PORTABLE CHEST 1 VIEW COMPARISON:  Radiograph October 02, 2017. FINDINGS: Stable cardiomediastinal silhouette. Endotracheal tube is seen projected over tracheal air shadow with distal tip  3 cm above the carina. Orogastric tube is seen passing through esophagus into stomach. Distal tip is not clearly visualized. Mild central pulmonary vascular congestion is noted. Mild bibasilar subsegmental atelectasis is noted. No pneumothorax is noted. Bony thorax is unremarkable. IMPRESSION: Endotracheal and orogastric tubes are in grossly good position. Mild central pulmonary vascular congestion is noted. Mild bibasilar subsegmental atelectasis is noted. Electronically Signed   By: Marijo Conception, M.D.   On: 10/04/2017 09:55   Dg Abd Portable 1v  Result Date: 10/04/2017 CLINICAL DATA:  Orogastric tube placement. EXAM: PORTABLE ABDOMEN - 1 VIEW COMPARISON:  Radiographs of October 02, 2017. FINDINGS: No definite abnormal bowel dilatation is seen currently. Air-filled colon is noted. Distal tip of orogastric tube is seen in proximal  stomach. IMPRESSION: Distal tip of orogastric tube seen in proximal stomach. Electronically Signed   By: Marijo Conception, M.D.   On: 10/04/2017 09:56   Ct Perc Cholecystostomy  Result Date: 10/04/2017 INDICATION: 72 year old with abdominal pain, sepsis and confusion. Imaging studies and physical examination are suggestive for acute cholecystitis. EXAM: IMAGE GUIDED PERCUTANEOUS CHOLECYSTOSTOMY TUBE PLACEMENT MEDICATIONS: Scheduled IV Zosyn 3.375 gm was given immediately prior to the procedure. ANESTHESIA/SEDATION: Patient was intubated and sedated prior to arriving in the Radiology department. Patient was monitored by radiology nurse throughout the procedure. FLUOROSCOPY TIME:  None COMPLICATIONS: None immediate. PROCEDURE: Informed written consent was obtained from the patient's daughter after a thorough discussion of the procedural risks, benefits and alternatives. All questions were addressed. Maximal Sterile Barrier Technique was utilized including caps, mask, sterile gowns, sterile gloves, sterile drape, hand hygiene and skin antiseptic. A timeout was performed prior to the  initiation of the procedure. Patient was supine on the CT scanner. The gallbladder was identified with CT and ultrasound. The anterior abdomen was prepped and draped in sterile fashion. Skin was anesthetized with 1% lidocaine. Using ultrasound guidance, an 18 gauge trocar needle was directed into the dilated gallbladder. A stiff Amplatz wire was advanced into the gallbladder with ultrasound guidance. The tract was dilated to accommodate a 10.2 [place] multipurpose drain. 90 mL of cloudy dark brown fluid was removed. Catheter was sutured to skin and placed to gravity bag. Follow up CT images confirm placement in the gallbladder. Ultrasound and CT images were saved for documentation. FINDINGS: Mild distention of the gallbladder with mild gallbladder wall thickening. Small amount of perihepatic ascites. Gallbladder drain was successfully placed within the gallbladder. 90 mL of cloudy brown fluid was removed and sent for culture. IMPRESSION: Successful image guided percutaneous cholecystostomy tube placement. Electronically Signed   By: Markus Daft M.D.   On: 10/04/2017 14:44       DISCUSSION: 72 year old man with hypertension, diabetes, sleep apnea, GERD.  He presents with abdominal pain, transaminitis and has evolved an acute abdomen.  Unclear as to whether this reflects small bowel obstruction, bowel injury.  Cholangitis also being considered but compromised radiographical evaluation not entirely consistent.  General surgery and GI following.  He needs definitive imaging in order to plan next steps.  He will not be able to get this as long as he is agitated, tachypneic with compromised respiratory status.  He will be sedated, intubated to facilitate work up and trreatment  ASSESSMENT / PLAN:  PULMONARY A: Acute respiratory failure  10/05/2017 - seems to fail sBT off sedation  P:   PRVC 8cc/kg ; doubt we can extubate 10/05/2017 Daily WUA Assess for SBT once abdominal issues have been addressed  VAP  prevention orders  CARDIOVASCULAR A:  Hypertension, likely exacerbated by his abdominal pain, respiratory discomfort   - 10/05/2017 - not on pressors P:  metoprolol, hydralazine as needed Suspect this will improve also with adequate pain control, sedation  RENAL Recent Labs  Lab 10/02/17 0948 10/03/17 0443 10/04/17 0412 10/04/17 2149 10/05/17 0300  CREATININE 3.42* 2.87* 1.63* 1.65* 1.67*    A:   Acute renal failure, probably ATN,   10/05/2017  - urine cloudy  P:   Follow BMP, urine output Careful with contrast media and possible nephrotoxins Change saline to LR LR bolus   GASTROINTESTINAL A:   Abdominal pain, evolving acute abdomen Transaminitis Possible Pancreat lesion on MRI at admit :   Etiology unclear.  Some aspects of his exam and imaging  have suggested possible gallbladder disease, but other than consistent with cholangitis.  Studies have been compromised by technique.. S/p  percutaneous cholecystostomy tube.  PLAN H2 blockade TF 10/06/17 if ok with consults Continue gall bladder drain OPD fu pancreatic lesion needed  HEMATOLOGIC A:   Mild leukocytosis Relative anemia, normocytic, likely due to acute illness P:  Follow CBC - PRBC for hgb </= 6.9gm%    - exceptions are   -  if ACS susepcted/confirmed then transfuse for hgb </= 8.0gm%,  or    -  active bleeding with hemodynamic instability, then transfuse regardless of hemoglobin value   At at all times try to transfuse 1 unit prbc as possible with exception of active hemorrhage    INFECTIOUS A:   Suspect SBO, possible bowel ischemia Possible cholangitis -Viral hepatitis panel negative -Flu panel negative P:   On empiric Zosyn at this time. Blood cultures pending  ENDOCRINE A:   Diabetes mellitus P:    ICU hyperglycemia protocol  NEUROLOGIC A:   Abdominal pain Sedation for MV  - tacypneic when diprivan weaned  P:   RASS goal: -1 Propofol for sedation, hopefully will facilitate  a quick wake up once procedures have been done and he is stabilized Watch for elevated triglycerides, any risk for pancreatic inflammation on propofol Fentanyl for pain control   FAMILY  - Updates: discussed acute issues, plans with his daughter at bedside 1/18. No family bedside 10/05/2017   - Inter-disciplinary family meet or Palliative Care meeting due by:  10/11/17   The patient is critically ill with multiple organ systems failure and requires high complexity decision making for assessment and support, frequent evaluation and titration of therapies, application of advanced monitoring technologies and extensive interpretation of multiple databases.   Critical Care Time devoted to patient care services described in this note is  30  Minutes. This time reflects time of care of this signee Dr Brand Males. This critical care time does not reflect procedure time, or teaching time or supervisory time of PA/NP/Med student/Med Resident etc but could involve care discussion time    Dr. Brand Males, M.D., [facility].C.P Pulmonary and Critical Care Medicine Staff Physician [place] [facility] Pager: [phone], If no answer or between  15:00h - 7:00h: call [phone]  10/05/2017 9:14 AM

## 2017-10-05 NOTE — Progress Notes (Signed)
Referring Physician(s): Dr. Dalbert Batman  Supervising Physician: Markus Daft  Patient Status:  [facility] - In-pt  Chief Complaint: Acute cholecystitis  Subjective: Remains intubated.  Fevers up to 102 today. Drain intact.   Allergies: Codeine  Medications: Prior to Admission medications   Medication Sig Start Date End Date Taking? Authorizing Provider  Alpha-Lipoic Acid 600 MG CAPS Take 600 mg by mouth daily.   Yes [provider]  benazepril (LOTENSIN) 20 MG tablet Take 1 tablet (20 mg total) by mouth daily. 07/08/17  Yes Copland, Gay Filler, MD  diclofenac (VOLTAREN) 75 MG EC tablet Take 1 tablet (75 mg total) by mouth 2 (two) times daily. Use as needed for joint pains 07/19/17  Yes Copland, Gay Filler, MD  glipiZIDE (GLUCOTROL) 10 MG tablet Take 1 tablet (10 mg total) by mouth 2 (two) times daily before a meal. 07/08/17  Yes Copland, Gay Filler, MD  hydrochlorothiazide (HYDRODIURIL) 25 MG tablet Take 0.5 tablets (12.5 mg total) by mouth daily. 07/08/17  Yes Copland, Gay Filler, MD  Melatonin 3 MG TABS Take 1 tablet by mouth at bedtime.    Yes [provider]  Multiple Vitamin (MULTIVITAMIN) tablet Take 1 tablet by mouth daily.   Yes [provider]  Turmeric 450 MG CAPS Take 1 tablet by mouth daily.    Yes [provider]  Acetylcarnitine HCl 500 MG CAPS Take 500 mg by mouth daily.    [provider]  Alcohol Swabs (ALCOHOL PREP) PADS Use prior to insulin dose 08/22/17   Copland, Jessica C, MD  glucose blood test strip Use as instructed 06/28/16   Copland, Gay Filler, MD  Insulin Pen Needle (PEN NEEDLES) 31G X 5 MM MISC 1 each by Does not apply route daily. 08/22/17   Copland, Gay Filler, MD  liraglutide (VICTOZA) 18 MG/3ML SOPN Inject 1.8 mg [place] daily 07/08/17   Copland, Gay Filler, MD  metFORMIN (GLUCOPHAGE) 1000 MG tablet Take 1 tablet (1,000 mg total) by mouth 2 (two) times daily with a meal. 07/08/17   Copland, Gay Filler, MD  niacin (NIASPAN) 500  MG CR tablet Take 1 tablet (500 mg total) by mouth at bedtime. Patient not taking: Reported on 10/02/2017 02/08/16   Copland, Gay Filler, MD  omeprazole (PRILOSEC) 40 MG capsule Take 1 capsule (40 mg total) by mouth daily. Patient taking differently: Take 40 mg by mouth daily as needed (heartburn).  07/08/17   Copland, Gay Filler, MD  tadalafil (CIALIS) 5 MG tablet Take 1 tablet (5 mg total) daily as needed by mouth for erectile dysfunction (and BPH). 07/24/17   Copland, Gay Filler, MD     Vital Signs: BP 131/65   Pulse (!) 128   Temp (!) 102.9 F (39.4 C)   Resp (!) 25   Ht 5\' 9"  (1.753 m)   Wt 237 lb 7 oz (107.7 kg)   SpO2 98%   BMI 35.06 kg/m   Physical Exam  NAD, alert Abd: distended.  Insertion site intact.  Dressing replaced.  Thin, bilious output in bag which is placed to gravity.   Imaging: Ct Abdomen Pelvis Wo Contrast  Result Date: 10/04/2017 CLINICAL DATA:  Fever, chills, abdominal pain, nausea and vomiting. Elevated liver function studies. EXAM: CT ABDOMEN AND PELVIS WITHOUT CONTRAST TECHNIQUE: Multidetector CT imaging of the abdomen and pelvis was performed following the standard protocol without IV contrast. COMPARISON:  MRI abdomen 10/03/2017 FINDINGS: Lower chest: Small to moderate-sized bilateral pleural effusions with overlying atelectasis. Mild cardiac enlargement. Coronary  artery calcifications. There is an NG tube coursing down the esophagus and into the stomach. Hepatobiliary: The gallbladder is distended and contains gallstones. Surrounding inflammatory changes suspicious for acute cholecystitis. Mild common bile duct dilatation with maximum diameter of 8.5 mm. No obvious common bile duct stones. Pancreas: No mass, inflammation or ductal dilatation. Duodenum diverticulum noted near the pancreatic head. Spleen: Normal size.  No focal lesions. Adrenals/Urinary Tract: The adrenal glands and kidneys are unremarkable. Right renal cyst is noted. The bladder contains a Foley  catheter. Stomach/Bowel: The stomach, duodenum, small bowel and colon are grossly normal without oral contrast. 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 There is an NG tube in the stomach. Vascular/Lymphatic: Scattered aortic calcifications but no aneurysm. Small scattered mesenteric and retroperitoneal lymph nodes but no mass or adenopathy. Reproductive: The prostate gland contains brachytherapy seeds. There is a Foley catheter in the bladder. Seminal vesicles appear normal. Other: Moderate free pelvic fluid and mild free abdominal fluid. This measures as simple fluid. No pelvic mass or adenopathy. No inguinal mass or hernia. Musculoskeletal: Advanced degenerative changes involving the spine. Severe disc disease at L2-3. No acute bony findings. IMPRESSION: 1. CT findings most suggestive of acute cholecystitis. Right upper quadrant inflammatory process could also be due to duodenitis or pancreatitis but I think both of these are much less likely. 2. Bilateral pleural effusions and overlying atelectasis. 3. Small amount of free abdominal fluid and moderate amount of free pelvic fluid. Electronically Signed   By: Marijo Sanes M.D.   On: 10/04/2017 12:41   Nm Hepatobiliary Liver Func  Result Date: 10/03/2017 CLINICAL DATA:  Clinical concern for cystic duct obstruction. EXAM: NUCLEAR MEDICINE HEPATOBILIARY IMAGING TECHNIQUE: Sequential images of the abdomen were obtained out to 60 minutes following intravenous administration of radiopharmaceutical. RADIOPHARMACEUTICALS:  5.5 millicurie mCi [id number]  Choletec IV COMPARISON:  Ultrasound exam from yesterday. MRI from earlier today. FINDINGS: The patient was unable to complete the exam and terminated the study after 25 minutes of imaging. During this 25 minutes, there is brisk uptake of radiotracer by the hepatic parenchyma with rapid normal clearance of blood pool activity. Common duct activity is visible by 15 minutes and gut activity is visible by 20 minutes. No gallbladder  filling observed during this initial 25 minutes of imaging. IMPRESSION: 1. Patent common bile duct. 2. Cystic duct patency cannot be assessed on this study as patient terminated the exam before it was complete. Electronically Signed   By: Misty Stanley M.D.   On: 10/03/2017 19:36   Mr Abdomen Mrcp Wo Contrast  Result Date: 10/03/2017 CLINICAL DATA:  Jaundice with abdominal pain. Gallstones noted on recent ultrasound exam. EXAM: MRI ABDOMEN WITHOUT CONTRAST  (INCLUDING MRCP) TECHNIQUE: Multiplanar multisequence MR imaging of the abdomen was performed. Heavily T2-weighted images of the biliary and pancreatic ducts were obtained, and three-dimensional MRCP images were rendered by post processing. COMPARISON:  Ultrasound exam 10/02/2017 FINDINGS: Study is markedly motion degraded due to patient inability to reproducibly breath hold. Lower chest: Heart size upper normal. Small bilateral pleural effusions. Hepatobiliary: Liver measures 24.3 cm craniocaudal length, enlarged. Diffuse fatty deposition noted within the liver parenchyma. Tiny stones are seen dependently in the lumen of the gallbladder (images 30 and 31 of series 13). There is some debris or possible stones in the fundus of the gallbladder. Trace peri cholecystic fluid evident. No substantial intrahepatic biliary duct dilatation. The common duct and common bile ducts are not well evaluated due to the substantial motion artifact. Extrahepatic common duct measures 7 mm  diameter where visualized on axial single shot T2 weighted imaging. Common bile duct in the head of the pancreas is 7 mm diameter although incompletely visualized. There is some central flow artifact within the common bile duct on the axial T2 imaging. Pancreas: No dilatation of the main duct. 17 x 11 x 20 mm cystic lesion identified in the posterior pancreatic head. Susceptibility artifact in the head of the pancreas on multiple sequences may be related to gas and duodenal diverticuli or  parenchymal calcification in the pancreatic head. Spleen:  No splenomegaly. No focal mass lesion. Adrenals/Urinary Tract: No adrenal nodule or mass. 4.1 cm simple appearing cyst identified posterior right kidney. No overtly suspicious lesion identified in either kidney on this noncontrast exam. Stomach/Bowel: Stomach is nondistended. Duodenum is normally positioned as is the ligament of Treitz. No small bowel or colonic dilatation within the visualized abdomen. Vascular/Lymphatic: No abdominal aortic aneurysm. Signal void within the portal vein suggests patency. No abdominal lymphadenopathy. Other:  Trace fluid is noted at the inferior liver. Musculoskeletal: No abnormal marrow signal within the visualized bony anatomy. IMPRESSION: 1. Markedly motion degraded study due to patient inability to reproducibly breath hold. Common duct and common bile duct are incompletely evaluated. Where visualized, the extrahepatic biliary tree measures up to 7 mm diameter. Portions of the common bile duct entering and tracking through the head of pancreas are not visualized. 2. Tiny gallstones noted in the dependent gallbladder lumen. There is trace pericholecystic fluid. Debris is noted in the gallbladder fundus. 3. No dilatation of the main pancreatic duct with a 7 x 11 x 20 mm cystic lesion in the posterior pancreatic head showing no definite communication to the main duct but motion artifact limits assessment. Repeat MRI in 6 months recommended to re-evaluate. This recommendation follows ACR consensus guidelines: Management of Incidental Pancreatic Cysts: A White Paper of the ACR Incidental Findings Committee. [place] 2263;[id number]. 4. Hepatic steatosis with hepatomegaly. 5. Small bilateral pleural effusions. Electronically Signed   By: Misty Stanley M.D.   On: 10/03/2017 16:04   Dg Chest Port 1 View  Result Date: 10/05/2017 CLINICAL DATA:  Hypoxia EXAM: PORTABLE CHEST 1 VIEW COMPARISON:  October 04, 2017 FINDINGS:  Endotracheal tube tip is 3.2 cm above the carina. Nasogastric tube tip and side port are below the diaphragm. No pneumothorax. There is atelectatic change in the left base with small left pleural effusion. Lungs elsewhere are clear. Heart is mildly enlarged with pulmonary vascularity within normal limits. No adenopathy. No bone lesions. IMPRESSION: Tube positions as described without pneumothorax. Left base atelectasis with small left pleural effusion. Lungs elsewhere clear. Stable cardiac silhouette. Electronically Signed   By: Lowella Grip III M.D.   On: 10/05/2017 07:15   Dg Chest Port 1 View  Result Date: 10/04/2017 CLINICAL DATA:  Endotracheal and orogastric tube placement. EXAM: PORTABLE CHEST 1 VIEW COMPARISON:  Radiograph October 02, 2017. FINDINGS: Stable cardiomediastinal silhouette. Endotracheal tube is seen projected over tracheal air shadow with distal tip 3 cm above the carina. Orogastric tube is seen passing through esophagus into stomach. Distal tip is not clearly visualized. Mild central pulmonary vascular congestion is noted. Mild bibasilar subsegmental atelectasis is noted. No pneumothorax is noted. Bony thorax is unremarkable. IMPRESSION: Endotracheal and orogastric tubes are in grossly good position. Mild central pulmonary vascular congestion is noted. Mild bibasilar subsegmental atelectasis is noted. Electronically Signed   By: Marijo Conception, M.D.   On: 10/04/2017 09:55   Dg Abdomen Acute W/chest  Result Date: 10/02/2017 CLINICAL DATA:  72 year old male with abdominal pain, distention, vomiting, diarrhea. Symptoms for 3 days. EXAM: DG ABDOMEN ACUTE W/ 1V CHEST COMPARISON:  Chest radiograph 04/11/2007. Lumbar radiographs 02/11/2014. FINDINGS: Lung volumes are stable and within normal limits. Normal cardiac size and mediastinal contours. Visualized tracheal air column is within normal limits. No pneumothorax or pneumoperitoneum. No abnormal pulmonary opacity. Multiple upper limits  of normal to abnormally dilated gas containing small bowel loops in the mid abdomen, up to 4.5-5 centimeters diameter. Paucity of large bowel gas. Other abdominal and pelvic visceral contours appear normal. There are surgical clips about the pubic symphysis which are new since 2,015. Degenerative changes in the spine. No acute osseous abnormality identified. IMPRESSION: 1. Bowel-gas pattern suggesting a small bowel obstruction or small bowel ileus. No free air. 2.  No acute cardiopulmonary abnormality. Electronically Signed   By: Genevie Ann M.D.   On: 10/02/2017 10:18   Dg Abd Portable 1v  Result Date: 10/04/2017 CLINICAL DATA:  Orogastric tube placement. EXAM: PORTABLE ABDOMEN - 1 VIEW COMPARISON:  Radiographs of October 02, 2017. FINDINGS: No definite abnormal bowel dilatation is seen currently. Air-filled colon is noted. Distal tip of orogastric tube is seen in proximal stomach. IMPRESSION: Distal tip of orogastric tube seen in proximal stomach. Electronically Signed   By: Marijo Conception, M.D.   On: 10/04/2017 09:56   Ct Perc Cholecystostomy  Result Date: 10/04/2017 INDICATION: 72 year old with abdominal pain, sepsis and confusion. Imaging studies and physical examination are suggestive for acute cholecystitis. EXAM: IMAGE GUIDED PERCUTANEOUS CHOLECYSTOSTOMY TUBE PLACEMENT MEDICATIONS: Scheduled IV Zosyn 3.375 gm was given immediately prior to the procedure. ANESTHESIA/SEDATION: Patient was intubated and sedated prior to arriving in the Radiology department. Patient was monitored by radiology nurse throughout the procedure. FLUOROSCOPY TIME:  None COMPLICATIONS: None immediate. PROCEDURE: Informed written consent was obtained from the patient's daughter after a thorough discussion of the procedural risks, benefits and alternatives. All questions were addressed. Maximal Sterile Barrier Technique was utilized including caps, mask, sterile gowns, sterile gloves, sterile drape, hand hygiene and skin antiseptic.  A timeout was performed prior to the initiation of the procedure. Patient was supine on the CT scanner. The gallbladder was identified with CT and ultrasound. The anterior abdomen was prepped and draped in sterile fashion. Skin was anesthetized with 1% lidocaine. Using ultrasound guidance, an 18 gauge trocar needle was directed into the dilated gallbladder. A stiff Amplatz wire was advanced into the gallbladder with ultrasound guidance. The tract was dilated to accommodate a 10.2 [place] multipurpose drain. 90 mL of cloudy dark brown fluid was removed. Catheter was sutured to skin and placed to gravity bag. Follow up CT images confirm placement in the gallbladder. Ultrasound and CT images were saved for documentation. FINDINGS: Mild distention of the gallbladder with mild gallbladder wall thickening. Small amount of perihepatic ascites. Gallbladder drain was successfully placed within the gallbladder. 90 mL of cloudy brown fluid was removed and sent for culture. IMPRESSION: Successful image guided percutaneous cholecystostomy tube placement. Electronically Signed   By: Markus Daft M.D.   On: 10/04/2017 14:44   US Abdomen Limited Ruq  Result Date: 10/02/2017 CLINICAL DATA:  Abdominal pain EXAM: ULTRASOUND ABDOMEN LIMITED RIGHT UPPER QUADRANT COMPARISON:  None. FINDINGS: Gallbladder: 2 small echogenic foci in the gallbladder do not move or shadow and could be polyps or stones. Gallbladder wall 2 mm. Mild pericholecystic fluid. Negative sonographic Murphy sign Common bile duct: Diameter: Mildly prominent 8 mm Liver: Echogenic liver difficult to  image without focal abnormality. Portal vein is patent on color Doppler imaging with normal direction of blood flow towards the liver. IMPRESSION: Small polyps versus adherent gallstones. Pericholecystic fluid. Negative sonographic Murphy sign. Common bile duct mildly dilated 8 mm. Fatty liver Electronically Signed   By: Franchot Gallo M.D.   On: 10/02/2017 13:05     Labs:  CBC: Recent Labs    10/03/17 0443 10/03/17 2340 10/04/17 0556 10/05/17 0300  WBC 10.7* 9.6 10.8* 7.5  HGB 11.1* 9.9* 10.3* 10.2*  HCT 32.4* 29.7* 31.0* 31.0*  PLT 192 207 226 167    COAGS: Recent Labs    10/04/17 0412  INR 1.04    BMP: Recent Labs    10/03/17 0443 10/04/17 0412 10/04/17 2149 10/05/17 0300  NA 135 138 139 136  K 4.2 4.0 3.7 3.9  CL 110 112* 115* 112*  CO2 16* 17* 16* 16*  GLUCOSE 173* 207* 145* 144*  BUN 48* 44* 40* 40*  CALCIUM 7.8* 8.4* 8.1* 7.7*  CREATININE 2.87* 1.63* 1.65* 1.67*  GFRNONAA 21* 41* 41* 40*  GFRAA 24* 48* 47* 47*    LIVER FUNCTION TESTS: Recent Labs    10/02/17 0948 10/03/17 0443 10/04/17 0412 10/05/17 0300  BILITOT 1.0 1.3* 1.1 3.4*  AST 89* 43* 53* 103*  ALT 259* 156* 115* 176*  ALKPHOS 73 56 53 143*  PROT 7.5 6.6 7.0 6.2*  ALBUMIN 3.7 3.2* 3.4* 2.6*    Assessment and Plan: Acute cholecystitis, s/p drain placement 1/18 Drain remains in place with thin, bilious output.  WBC improved, but febrile today up to 102.1 Abdomen distended.  Bag placed to gravity.  Continue current management.   Electronically Signed: Docia Barrier, PA 10/05/2017, 3:49 PM   I spent a total of 15 Minutes at the the patient's bedside AND on the patient's hospital floor or unit, greater than 50% of which was counseling/coordinating care for acute cholecystitis

## 2017-10-05 NOTE — Progress Notes (Signed)
Pharmacy Antibiotic Note  JAYDE DAFFIN is a 72 y.o. male admitted on 10/02/2017 with intra-abdominal infection.  Pharmacy has been consulted for Zosyn dosing.  Acute cholecystitis s/p perc cholecystomy drainage 10/04/2017.  Plan:  Continue Zosyn 3.375g IV Q8H infused over 4hrs.  Follow up renal fxn, culture results, and clinical course.   Height: 5\' 9"  (175.3 cm) Weight: 237 lb 7 oz (107.7 kg) IBW/kg (Calculated) : 70.7  Temp (24hrs), Avg:102.6 F (39.2 C), Min:100.9 F (38.3 C), Max:104.2 F (40.1 C)  Recent Labs  Lab 10/02/17 0948 10/02/17 1106 10/02/17 1323 10/02/17 1549 10/03/17 0443 10/03/17 2340 10/04/17 0412 10/04/17 0556 10/04/17 2149 10/05/17 0300  WBC 10.4  --   --   --  10.7* 9.6  --  10.8*  --  7.5  CREATININE 3.42*  --   --   --  2.87*  --  1.63*  --  1.65* 1.67*  LATICACIDVEN  --  1.54 2.48* 1.33  --   --   --   --   --   --     Estimated Creatinine Clearance: 50.5 mL/min (A) (by C-G formula based on SCr of 1.67 mg/dL (H)).    Allergies  Allergen Reactions  . Codeine     Antimicrobials this admission:  1/16 Zosyn >> 1/16 Vanc >> 1/16  Dose adjustments this admission:   Microbiology results:  1/16 BCx: ngtd 1/18 Bile fluid: pending  Thank you for allowing pharmacy to be a part of this patient's care.  Gretta Arab PharmD, BCPS Pager [phone] 10/05/2017 8:26 AM

## 2017-10-05 NOTE — Progress Notes (Signed)
Pt RR sustaining 40's while weaning on the vent, HR sustaining in 150's. Pt coughing and gagging, actively trying to remove ETT. Pt not redirectable at this time. 28mcg of Fentanyl given and RT notified to terminate wean at this time. Will re-evaluate as the day progresses.

## 2017-10-05 NOTE — Progress Notes (Signed)
[facility]:  [phone] General Surgery Progress Note   LOS: 3 days  POD -     Chief Complaint: Confusion, sepsis  Assessment and Plan: 1.  Cholecystitis  Perc drain of gall bladder - 10/04/2017   WBC - 7,500 - 10/05/2017  T. Bili - 3.4  Zosyn   2.  Sepsis 3.  VDRF 4.  A Fib - now back in sinus rythm 5.  Creatinine - 1.67 - 10/05/2017 6.  DVT prophylaxis - SQ Heparin   Principal Problem:   Acute cholecystitis s/p perc cholecystomy drainage 10/04/2017 Active Problems:   HYPERTENSION, BENIGN ESSENTIAL, UNCONTROLLED   Abdominal pain   Sepsis ([place])   Acute respiratory failure with hypoxia ([place])   AKI (acute kidney injury) ([place])   Septic shock ([facility])   Subjective:  Intubated and sedated  Objective:   Vitals:   10/05/17 0600 10/05/17 0700  BP: 105/60 (!) 109/55  Pulse: 89 82  Resp: (!) 23 (!) 26  Temp: (!) 101.1 F (38.4 C) (!) 100.9 F (38.3 C)  SpO2: 100% 100%     Intake/Output from previous day:  01/18 0701 - 01/19 0700 In: 5023.2 [I.V.:4823.2; IV Piggyback:200] Out: 1685 [Urine:1635; Drains:50]  Intake/Output this shift:  No intake/output data recorded.   Physical Exam:   General: Older WM who is intubated and sedated   HEENT: Normal. Pupils equal. .   Lungs: Rhonchi in the bases   Abdomen: Distended and quiet   Wound: Drain in RUQ, 50 cc recorded last 24 hours   Lab Results:    Recent Labs    10/04/17 0556 10/05/17 0300  WBC 10.8* 7.5  HGB 10.3* 10.2*  HCT 31.0* 31.0*  PLT 226 167    BMET   Recent Labs    10/04/17 2149 10/05/17 0300  NA 139 136  K 3.7 3.9  CL 115* 112*  CO2 16* 16*  GLUCOSE 145* 144*  BUN 40* 40*  CREATININE 1.65* 1.67*  CALCIUM 8.1* 7.7*    PT/INR   Recent Labs    10/04/17 0412  LABPROT 13.5  INR 1.04    ABG   Recent Labs    10/04/17 1250  PHART 7.358  HCO3 16.1*     Studies/Results:  Ct Abdomen Pelvis Wo Contrast  Result Date: 10/04/2017 CLINICAL DATA:  Fever, chills,  abdominal pain, nausea and vomiting. Elevated liver function studies. EXAM: CT ABDOMEN AND PELVIS WITHOUT CONTRAST TECHNIQUE: Multidetector CT imaging of the abdomen and pelvis was performed following the standard protocol without IV contrast. COMPARISON:  MRI abdomen 10/03/2017 FINDINGS: Lower chest: Small to moderate-sized bilateral pleural effusions with overlying atelectasis. Mild cardiac enlargement. Coronary artery calcifications. There is an NG tube coursing down the esophagus and into the stomach. Hepatobiliary: The gallbladder is distended and contains gallstones. Surrounding inflammatory changes suspicious for acute cholecystitis. Mild common bile duct dilatation with maximum diameter of 8.5 mm. No obvious common bile duct stones. Pancreas: No mass, inflammation or ductal dilatation. Duodenum diverticulum noted near the pancreatic head. Spleen: Normal size.  No focal lesions. Adrenals/Urinary Tract: The adrenal glands and kidneys are unremarkable. Right renal cyst is noted. The bladder contains a Foley catheter. Stomach/Bowel: The stomach, duodenum, small bowel and colon are grossly normal without oral contrast. 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 There is an NG tube in the stomach. Vascular/Lymphatic: Scattered aortic calcifications but no aneurysm. Small scattered mesenteric and retroperitoneal lymph nodes but no mass or adenopathy. Reproductive: The prostate gland contains brachytherapy seeds. There is a Foley  catheter in the bladder. Seminal vesicles appear normal. Other: Moderate free pelvic fluid and mild free abdominal fluid. This measures as simple fluid. No pelvic mass or adenopathy. No inguinal mass or hernia. Musculoskeletal: Advanced degenerative changes involving the spine. Severe disc disease at L2-3. No acute bony findings. IMPRESSION: 1. CT findings most suggestive of acute cholecystitis. Right upper quadrant inflammatory process could also be due to duodenitis or pancreatitis but I think both  of these are much less likely. 2. Bilateral pleural effusions and overlying atelectasis. 3. Small amount of free abdominal fluid and moderate amount of free pelvic fluid. Electronically Signed   By: Marijo Sanes M.D.   On: 10/04/2017 12:41   Nm Hepatobiliary Liver Func  Result Date: 10/03/2017 CLINICAL DATA:  Clinical concern for cystic duct obstruction. EXAM: NUCLEAR MEDICINE HEPATOBILIARY IMAGING TECHNIQUE: Sequential images of the abdomen were obtained out to 60 minutes following intravenous administration of radiopharmaceutical. RADIOPHARMACEUTICALS:  5.5 millicurie mCi QI-34V  Choletec IV COMPARISON:  Ultrasound exam from yesterday. MRI from earlier today. FINDINGS: The patient was unable to complete the exam and terminated the study after 25 minutes of imaging. During this 25 minutes, there is brisk uptake of radiotracer by the hepatic parenchyma with rapid normal clearance of blood pool activity. Common duct activity is visible by 15 minutes and gut activity is visible by 20 minutes. No gallbladder filling observed during this initial 25 minutes of imaging. IMPRESSION: 1. Patent common bile duct. 2. Cystic duct patency cannot be assessed on this study as patient terminated the exam before it was complete. Electronically Signed   By: Misty Stanley M.D.   On: 10/03/2017 19:36   Mr Abdomen Mrcp Wo Contrast  Result Date: 10/03/2017 CLINICAL DATA:  Jaundice with abdominal pain. Gallstones noted on recent ultrasound exam. EXAM: MRI ABDOMEN WITHOUT CONTRAST  (INCLUDING MRCP) TECHNIQUE: Multiplanar multisequence MR imaging of the abdomen was performed. Heavily T2-weighted images of the biliary and pancreatic ducts were obtained, and three-dimensional MRCP images were rendered by post processing. COMPARISON:  Ultrasound exam 10/02/2017 FINDINGS: Study is markedly motion degraded due to patient inability to reproducibly breath hold. Lower chest: Heart size upper normal. Small bilateral pleural effusions.  Hepatobiliary: Liver measures 24.3 cm craniocaudal length, enlarged. Diffuse fatty deposition noted within the liver parenchyma. Tiny stones are seen dependently in the lumen of the gallbladder (images 30 and 31 of series 13). There is some debris or possible stones in the fundus of the gallbladder. Trace peri cholecystic fluid evident. No substantial intrahepatic biliary duct dilatation. The common duct and common bile ducts are not well evaluated due to the substantial motion artifact. Extrahepatic common duct measures 7 mm diameter where visualized on axial single shot T2 weighted imaging. Common bile duct in the head of the pancreas is 7 mm diameter although incompletely visualized. There is some central flow artifact within the common bile duct on the axial T2 imaging. Pancreas: No dilatation of the main duct. 17 x 11 x 20 mm cystic lesion identified in the posterior pancreatic head. Susceptibility artifact in the head of the pancreas on multiple sequences may be related to gas and duodenal diverticuli or parenchymal calcification in the pancreatic head. Spleen:  No splenomegaly. No focal mass lesion. Adrenals/Urinary Tract: No adrenal nodule or mass. 4.1 cm simple appearing cyst identified posterior right kidney. No overtly suspicious lesion identified in either kidney on this noncontrast exam. Stomach/Bowel: Stomach is nondistended. Duodenum is normally positioned as is the ligament of Treitz. No small bowel or colonic  dilatation within the visualized abdomen. Vascular/Lymphatic: No abdominal aortic aneurysm. Signal void within the portal vein suggests patency. No abdominal lymphadenopathy. Other:  Trace fluid is noted at the inferior liver. Musculoskeletal: No abnormal marrow signal within the visualized bony anatomy. IMPRESSION: 1. Markedly motion degraded study due to patient inability to reproducibly breath hold. Common duct and common bile duct are incompletely evaluated. Where visualized, the  extrahepatic biliary tree measures up to 7 mm diameter. Portions of the common bile duct entering and tracking through the head of pancreas are not visualized. 2. Tiny gallstones noted in the dependent gallbladder lumen. There is trace pericholecystic fluid. Debris is noted in the gallbladder fundus. 3. No dilatation of the main pancreatic duct with a 7 x 11 x 20 mm cystic lesion in the posterior pancreatic head showing no definite communication to the main duct but motion artifact limits assessment. Repeat MRI in 6 months recommended to re-evaluate. This recommendation follows ACR consensus guidelines: Management of Incidental Pancreatic Cysts: A White Paper of the ACR Incidental Findings Committee. [place] 0109;[id number]. 4. Hepatic steatosis with hepatomegaly. 5. Small bilateral pleural effusions. Electronically Signed   By: Misty Stanley M.D.   On: 10/03/2017 16:04   Dg Chest Port 1 View  Result Date: 10/05/2017 CLINICAL DATA:  Hypoxia EXAM: PORTABLE CHEST 1 VIEW COMPARISON:  October 04, 2017 FINDINGS: Endotracheal tube tip is 3.2 cm above the carina. Nasogastric tube tip and side port are below the diaphragm. No pneumothorax. There is atelectatic change in the left base with small left pleural effusion. Lungs elsewhere are clear. Heart is mildly enlarged with pulmonary vascularity within normal limits. No adenopathy. No bone lesions. IMPRESSION: Tube positions as described without pneumothorax. Left base atelectasis with small left pleural effusion. Lungs elsewhere clear. Stable cardiac silhouette. Electronically Signed   By: Lowella Grip III M.D.   On: 10/05/2017 07:15   Dg Chest Port 1 View  Result Date: 10/04/2017 CLINICAL DATA:  Endotracheal and orogastric tube placement. EXAM: PORTABLE CHEST 1 VIEW COMPARISON:  Radiograph October 02, 2017. FINDINGS: Stable cardiomediastinal silhouette. Endotracheal tube is seen projected over tracheal air shadow with distal tip 3 cm above the carina.  Orogastric tube is seen passing through esophagus into stomach. Distal tip is not clearly visualized. Mild central pulmonary vascular congestion is noted. Mild bibasilar subsegmental atelectasis is noted. No pneumothorax is noted. Bony thorax is unremarkable. IMPRESSION: Endotracheal and orogastric tubes are in grossly good position. Mild central pulmonary vascular congestion is noted. Mild bibasilar subsegmental atelectasis is noted. Electronically Signed   By: Marijo Conception, M.D.   On: 10/04/2017 09:55   Dg Abd Portable 1v  Result Date: 10/04/2017 CLINICAL DATA:  Orogastric tube placement. EXAM: PORTABLE ABDOMEN - 1 VIEW COMPARISON:  Radiographs of October 02, 2017. FINDINGS: No definite abnormal bowel dilatation is seen currently. Air-filled colon is noted. Distal tip of orogastric tube is seen in proximal stomach. IMPRESSION: Distal tip of orogastric tube seen in proximal stomach. Electronically Signed   By: Marijo Conception, M.D.   On: 10/04/2017 09:56   Ct Perc Cholecystostomy  Result Date: 10/04/2017 INDICATION: 72 year old with abdominal pain, sepsis and confusion. Imaging studies and physical examination are suggestive for acute cholecystitis. EXAM: IMAGE GUIDED PERCUTANEOUS CHOLECYSTOSTOMY TUBE PLACEMENT MEDICATIONS: Scheduled IV Zosyn 3.375 gm was given immediately prior to the procedure. ANESTHESIA/SEDATION: Patient was intubated and sedated prior to arriving in the Radiology department. Patient was monitored by radiology nurse throughout the procedure. FLUOROSCOPY TIME:  None COMPLICATIONS:  None immediate. PROCEDURE: Informed written consent was obtained from the patient's daughter after a thorough discussion of the procedural risks, benefits and alternatives. All questions were addressed. Maximal Sterile Barrier Technique was utilized including caps, mask, sterile gowns, sterile gloves, sterile drape, hand hygiene and skin antiseptic. A timeout was performed prior to the initiation of the  procedure. Patient was supine on the CT scanner. The gallbladder was identified with CT and ultrasound. The anterior abdomen was prepped and draped in sterile fashion. Skin was anesthetized with 1% lidocaine. Using ultrasound guidance, an 18 gauge trocar needle was directed into the dilated gallbladder. A stiff Amplatz wire was advanced into the gallbladder with ultrasound guidance. The tract was dilated to accommodate a 10.2 [place] multipurpose drain. 90 mL of cloudy dark brown fluid was removed. Catheter was sutured to skin and placed to gravity bag. Follow up CT images confirm placement in the gallbladder. Ultrasound and CT images were saved for documentation. FINDINGS: Mild distention of the gallbladder with mild gallbladder wall thickening. Small amount of perihepatic ascites. Gallbladder drain was successfully placed within the gallbladder. 90 mL of cloudy brown fluid was removed and sent for culture. IMPRESSION: Successful image guided percutaneous cholecystostomy tube placement. Electronically Signed   By: Markus Daft M.D.   On: 10/04/2017 14:44     Anti-infectives:   Anti-infectives (From admission, onward)   Start     Dose/Rate Route Frequency Ordered Stop   10/02/17 2200  piperacillin-tazobactam (ZOSYN) IVPB 3.375 g     3.375 g 12.5 mL/hr over 240 Minutes Intravenous Every 8 hours 10/02/17 1257     10/02/17 1400  vancomycin (VANCOCIN) IVPB 1000 mg/200 mL premix  Status:  Discontinued     1,000 mg 200 mL/hr over 60 Minutes Intravenous Every 24 hours 10/02/17 1257 10/02/17 1843   10/02/17 1300  piperacillin-tazobactam (ZOSYN) IVPB 3.375 g     3.375 g 100 mL/hr over 30 Minutes Intravenous  Once 10/02/17 1257 10/02/17 1352      Alphonsa Overall, MD, FACS Pager: [facility] Office: [phone] 10/05/2017

## 2017-10-06 ENCOUNTER — Encounter (HOSPITAL_COMMUNITY): Payer: Self-pay | Admitting: Student

## 2017-10-06 DIAGNOSIS — K8000 Calculus of gallbladder with acute cholecystitis without obstruction: Secondary | ICD-10-CM

## 2017-10-06 LAB — CBC WITH DIFFERENTIAL/PLATELET
Basophils Absolute: 0 10*3/uL (ref 0.0–0.1)
Basophils Relative: 0 %
EOS PCT: 1 %
Eosinophils Absolute: 0.1 10*3/uL (ref 0.0–0.7)
HCT: 26.4 % — ABNORMAL LOW (ref 39.0–52.0)
Hemoglobin: 8.9 g/dL — ABNORMAL LOW (ref 13.0–17.0)
LYMPHS ABS: 0.9 10*3/uL (ref 0.7–4.0)
LYMPHS PCT: 11 %
MCH: 28.6 pg (ref 26.0–34.0)
MCHC: 33.7 g/dL (ref 30.0–36.0)
MCV: 84.9 fL (ref 78.0–100.0)
MONO ABS: 0.4 10*3/uL (ref 0.1–1.0)
Monocytes Relative: 5 %
Neutro Abs: 6.6 10*3/uL (ref 1.7–7.7)
Neutrophils Relative %: 83 %
PLATELETS: 208 10*3/uL (ref 150–400)
RBC: 3.11 MIL/uL — ABNORMAL LOW (ref 4.22–5.81)
RDW: 14.8 % (ref 11.5–15.5)
WBC: 8 10*3/uL (ref 4.0–10.5)

## 2017-10-06 LAB — GLUCOSE, CAPILLARY
GLUCOSE-CAPILLARY: 138 mg/dL — AB (ref 65–99)
GLUCOSE-CAPILLARY: 180 mg/dL — AB (ref 65–99)
GLUCOSE-CAPILLARY: 186 mg/dL — AB (ref 65–99)
Glucose-Capillary: 138 mg/dL — ABNORMAL HIGH (ref 65–99)
Glucose-Capillary: 123 mg/dL — ABNORMAL HIGH (ref 65–99)
Glucose-Capillary: 133 mg/dL — ABNORMAL HIGH (ref 65–99)
Glucose-Capillary: 136 mg/dL — ABNORMAL HIGH (ref 65–99)

## 2017-10-06 LAB — HEPATIC FUNCTION PANEL
ALK PHOS: 123 U/L (ref 38–126)
ALT: 144 U/L — AB (ref 17–63)
AST: 83 U/L — ABNORMAL HIGH (ref 15–41)
Albumin: 2.3 g/dL — ABNORMAL LOW (ref 3.5–5.0)
BILIRUBIN INDIRECT: 1.2 mg/dL — AB (ref 0.3–0.9)
Bilirubin, Direct: 2.4 mg/dL — ABNORMAL HIGH (ref 0.1–0.5)
TOTAL PROTEIN: 6.1 g/dL — AB (ref 6.5–8.1)
Total Bilirubin: 3.6 mg/dL — ABNORMAL HIGH (ref 0.3–1.2)

## 2017-10-06 LAB — CK TOTAL AND CKMB (NOT AT ARMC)
CK, MB: 22.3 ng/mL — ABNORMAL HIGH (ref 0.5–5.0)
RELATIVE INDEX: 19.4 — AB (ref 0.0–2.5)
Total CK: 115 U/L (ref 49–397)

## 2017-10-06 LAB — URINE CULTURE: CULTURE: NO GROWTH

## 2017-10-06 LAB — BASIC METABOLIC PANEL
ANION GAP: 7 (ref 5–15)
BUN: 37 mg/dL — AB (ref 6–20)
CALCIUM: 8.1 mg/dL — AB (ref 8.9–10.3)
CO2: 19 mmol/L — AB (ref 22–32)
Chloride: 115 mmol/L — ABNORMAL HIGH (ref 101–111)
Creatinine, Ser: 1.93 mg/dL — ABNORMAL HIGH (ref 0.61–1.24)
GFR calc Af Amer: 39 mL/min — ABNORMAL LOW (ref >60)
GFR, EST NON AFRICAN AMERICAN: 34 mL/min — AB (ref >60)
GLUCOSE: 130 mg/dL — AB (ref 65–99)
Potassium: 3.7 mmol/L (ref 3.5–5.1)
Sodium: 141 mmol/L (ref 135–145)

## 2017-10-06 LAB — PHOSPHORUS: Phosphorus: 2.9 mg/dL (ref 2.5–4.6)

## 2017-10-06 LAB — PROCALCITONIN: Procalcitonin: 10.9 ng/mL

## 2017-10-06 LAB — MAGNESIUM: Magnesium: 1.7 mg/dL (ref 1.7–2.4)

## 2017-10-06 LAB — LACTIC ACID, PLASMA: Lactic Acid, Venous: 1.1 mmol/L (ref 0.5–1.9)

## 2017-10-06 MED ORDER — HYDRALAZINE HCL 20 MG/ML IJ SOLN
10.0000 mg | Freq: Four times a day (QID) | INTRAMUSCULAR | Status: DC | PRN
  Administered 2017-10-06: 30 mg via INTRAVENOUS
  Filled 2017-10-06: qty 2

## 2017-10-06 MED ORDER — LACTATED RINGERS IV BOLUS (SEPSIS)
1000.0000 mL | Freq: Once | INTRAVENOUS | Status: AC
  Administered 2017-10-06: 1000 mL via INTRAVENOUS

## 2017-10-06 MED ORDER — LABETALOL HCL 5 MG/ML IV SOLN
5.0000 mg | INTRAVENOUS | Status: DC | PRN
  Administered 2017-10-06 – 2017-10-07 (×3): 5 mg via INTRAVENOUS
  Administered 2017-10-08 (×4): 10 mg via INTRAVENOUS
  Administered 2017-10-09: 5 mg via INTRAVENOUS
  Administered 2017-10-09 – 2017-10-10 (×4): 10 mg via INTRAVENOUS
  Filled 2017-10-06 (×10): qty 4

## 2017-10-06 MED ORDER — LIP MEDEX EX OINT
TOPICAL_OINTMENT | CUTANEOUS | Status: AC
  Administered 2017-10-06: 11:00:00
  Filled 2017-10-06: qty 7

## 2017-10-06 MED ORDER — MAGNESIUM SULFATE 2 GM/50ML IV SOLN
2.0000 g | Freq: Once | INTRAVENOUS | Status: AC
  Administered 2017-10-06: 2 g via INTRAVENOUS
  Filled 2017-10-06: qty 50

## 2017-10-06 MED ORDER — VANCOMYCIN HCL IN DEXTROSE 1-5 GM/200ML-% IV SOLN
1000.0000 mg | INTRAVENOUS | Status: DC
  Administered 2017-10-07: 1000 mg via INTRAVENOUS
  Filled 2017-10-06: qty 200

## 2017-10-06 MED ORDER — CHLORHEXIDINE GLUCONATE 0.12 % MT SOLN: 15.0000 mL | Freq: Two times a day (BID) | OROMUCOSAL | Status: DC

## 2017-10-06 MED ORDER — ORAL CARE MOUTH RINSE
15.0000 mL | Freq: Two times a day (BID) | OROMUCOSAL | Status: DC
  Administered 2017-10-07 – 2017-10-08 (×3): 15 mL via OROMUCOSAL

## 2017-10-06 MED ORDER — HYDRALAZINE HCL 20 MG/ML IJ SOLN
10.0000 mg | Freq: Four times a day (QID) | INTRAMUSCULAR | Status: DC | PRN
  Administered 2017-10-07 – 2017-10-10 (×4): 20 mg via INTRAVENOUS
  Filled 2017-10-06: qty 1
  Filled 2017-10-06: qty 2
  Filled 2017-10-06 (×2): qty 1

## 2017-10-06 MED ORDER — FENTANYL CITRATE (PF) 100 MCG/2ML IJ SOLN
25.0000 ug | INTRAMUSCULAR | Status: DC | PRN
  Administered 2017-10-06: 50 ug via INTRAVENOUS
  Administered 2017-10-06: 100 ug via INTRAVENOUS
  Administered 2017-10-06: 50 ug via INTRAVENOUS
  Administered 2017-10-07: 100 ug via INTRAVENOUS
  Administered 2017-10-07: 25 ug via INTRAVENOUS
  Administered 2017-10-07: 100 ug via INTRAVENOUS
  Administered 2017-10-07 – 2017-10-10 (×3): 50 ug via INTRAVENOUS
  Filled 2017-10-06 (×10): qty 2

## 2017-10-06 MED ORDER — VANCOMYCIN HCL 10 G IV SOLR
1500.0000 mg | Freq: Once | INTRAVENOUS | Status: AC
  Administered 2017-10-06: 1500 mg via INTRAVENOUS
  Filled 2017-10-06: qty 1500

## 2017-10-06 NOTE — Procedures (Signed)
Extubation Procedure Note  Patient Details:   Name: Timothy Chapman [facility] DOB: 30-Nov-1945 MRN: [id number]   Airway Documentation:  Airway 8 mm (Active)  Secured at (cm) 25 cm 10/06/2017  8:22 AM  Measured From Lips 10/06/2017  8:22 AM  [place] 10/06/2017  3:38 AM  Secured By [employer] 10/06/2017  8:22 AM  Tube Holder Repositioned Yes 10/06/2017  8:22 AM  Cuff Pressure (cm H2O) 32 cm H2O 10/05/2017  8:33 PM  Site Condition Dry 10/06/2017  3:38 AM    Evaluation  O2 sats: stable throughout Complications: No apparent complications Patient did tolerate procedure well. Bilateral Breath Sounds: Clear, Diminished   Yes  Baldwin [place] Nannette 10/06/2017, 10:22 AM   Positive cuff leak pre extubation. Placed on 3 lpm [place] post extubation.

## 2017-10-06 NOTE — Progress Notes (Signed)
Pharmacy Antibiotic Note  Timothy Chapman is a 72 y.o. male admitted on 10/02/2017 with intra-abdominal infection.  Pharmacy has been consulted for Zosyn dosing.  Acute cholecystitis s/p perc cholecystomy drainage 10/04/2017. Pharmacy is also consulted to dose vancomycin with ongoing fevers and gram positive cocci in bile drain culture.  Low threshold to stop vancomycin if not MRSA, especially with worsening SCr 1.93.  Plan:  Continue Zosyn 3.375g IV Q8H infused over 4hrs. Vancomycin 1500 mg IV x1 dose followed by 1g IV q24h. Measure Vanc trough, peak at steady state.  Follow up renal fxn, culture results, and clinical course.   Height: 5\' 9"  (175.3 cm) Weight: 241 lb 2.9 oz (109.4 kg) IBW/kg (Calculated) : 70.7  Temp (24hrs), Avg:102.1 F (38.9 C), Min:100.6 F (38.1 C), Max:103.1 F (39.5 C)  Recent Labs  Lab 10/02/17 1106 10/02/17 1323 10/02/17 1549 10/03/17 0443 10/03/17 2340 10/04/17 0412 10/04/17 0556 10/04/17 2149 10/05/17 0300 10/06/17 0216 10/06/17 0719  WBC  --   --   --  10.7* 9.6  --  10.8*  --  7.5  --  8.0  CREATININE  --   --   --  2.87*  --  1.63*  --  1.65* 1.67* 1.93*  --   LATICACIDVEN 1.54 2.48* 1.33  --   --   --   --   --   --  1.1  --     Estimated Creatinine Clearance: 44 mL/min (A) (by C-G formula based on SCr of 1.93 mg/dL (H)).    Allergies  Allergen Reactions  . Codeine     Antimicrobials this admission:  1/16 Vanc >> 1/16, resumed 1/20 >> 1/16 Zosyn >>  Dose adjustments this admission:   Microbiology results:  1/16 BCx: ngtd 1/18 Bile fluid: Gram variable rod, GPC 1/19 UCx:   Thank you for allowing pharmacy to be a part of this patient's care.  Gretta Arab PharmD, BCPS Pager [phone] 10/06/2017 10:16 AM

## 2017-10-06 NOTE — Progress Notes (Signed)
PULMONARY / CRITICAL CARE MEDICINE   Name: Timothy Chapman MRN: [id number] DOB: 1946/01/03    ADMISSION DATE:  10/02/2017 CONSULTATION DATE: 10/04/17  REFERRING MD:  Dr Sloan Leiter, [facility]  Reason for consultation: Agitation, tachypnea, in the setting of acute abdomen   BRIEF  72 year old obese gentleman with a history of hypertension, diabetes, obstructive sleep apnea, GERD.  Admitted on 1/16 with abdominal pain, nausea with vomiting, fever and chills for proximally 3 days.  His evaluation revealed a transaminitis, acute renal failure.  Initial x-rays suggested a small bowel obstruction and he was kept n.p.o.  Concern for either a viral hepatitis or cholangitis.  Unfortunately his course been complicated by some respiratory distress and agitation, delirium likely due to his evolving underlying illness.  Imaging has not been definitive- HIDA scan did not show occlusion of the common bile duct, MRCP was a poor study but the common bile duct was 7 mm in size.  Transaminases and renal function slightly improved but patient overall clinically worsening.  He moved to the ICU for stabilization in order to facilitate next steps including better abdominal imaging, possible cholecystostomy or surgical procedure.    STUDIES and EVENTS Blood 1/16 >> C. Difficile PCR >> never passed stookl KUB 1/16 >> dilated gas containing small bowel loops, paucity of large bowel gas consistent with a small bowel obstruction or small bowel ileus Right upper quadrant ultrasound 1/16 >> 2 small echogenic foci in the gallbladder, either stones or possibly polyps.  Mild pericholecystic fluid, negative sonographic Murphy, CBD 8 mm, good portal flow. HIDA scan 1/16 >> incomplete study, good uptake of tracer and rapid normal clearance, no gallbladder filling noted (study terminated early by the patient) MRCP 1/17 >> poor study due to motion, tiny gallstones in the dependent gallbladder lumen, trace pericholecystic fluid and  gallbladder debris, no dilation of the main pancreatic duct, the incompletely visualized extrahepatic biliary tree measures up to 7 mm in diameter  Zosyn 1/16 >>  ETT 1/18 >>  1/19 -  - urine cloudy. Not on pressors. On diprivan being weaned. On SBT /s/p cholecystostomy yesterday by IR.  Got very tachpneic when diprivan stopped while on SBT   SUBJECTIVE/OVERNIGHT/INTERVAL HX 1/20 - Gram positive cocci in biole fouids/ Doing well on sBT. Stil febrile. Creat rising despite LR bolus 1L: yesterday and maintenance at 50cc/h. Demands extuibation  VITAL SIGNS: BP (!) 143/68 (BP Location: Right Arm)   Pulse 83   Temp (!) 101.5 F (38.6 C)   Resp (!) 24   Ht 5\' 9"  (1.753 m)   Wt 109.4 kg (241 lb 2.9 oz)   SpO2 95%   BMI 35.62 kg/m   HEMODYNAMICS:    VENTILATOR SETTINGS: Vent Mode: CPAP;PSV FiO2 (%):  [30 %] 30 % Set Rate:  [16 bmp] 16 bmp Vt Set:  [570 mL] 570 mL PEEP:  [5 cmH20] 5 cmH20 Pressure Support:  [5 cmH20] 5 cmH20 Plateau Pressure:  [18 cmH20-20 cmH20] 19 cmH20  INTAKE / OUTPUT: I/O last 3 completed shifts: In: 3369.4 [I.V.:1969.4; IV Piggyback:1400] Out: 2245 [Urine:2035; Drains:210]  PHYSICAL EXAMINATION:   General Appearance:    Looks criticall ill OBESE - yes  Head:    Normocephalic, without obvious abnormality, atraumatic  Eyes:    PERRL - yes, conjunctiva/corneas - clear      Ears:    Normal external ear canals, both ears  Nose:   NG tube - no  Throat:  ETT TUBE - yes , OG tube - yes  Neck:  Supple,  No enlargement/tenderness/nodules     Lungs:     Clear to auscultation bilaterally, Ventilator   Synchrony - yes on sBT  Chest wall:    No deformity  Heart:    S1 and S2 normal, no murmur, CVP - no.  Pressors - no  Abdomen:     Soft, no masses, no organomegaly  Genitalia:    Not done  Rectal:   not done  Extremities:   Extremities- intact     Skin:   Intact in exposed areas . Sacral area - no decub     Neurologic:   Sedation - none -> RASS - +1 . Moves  all 4s - yes. CAM-ICU - neg . Orientation - x3+      PULMONARY Recent Labs  Lab 10/04/17 1250  PHART 7.358  PCO2ART 30.1*  PO2ART 401*  HCO3 16.1*  O2SAT 99.7    CBC Recent Labs  Lab 10/04/17 0556 10/05/17 0300 10/06/17 0719  HGB 10.3* 10.2* 8.9*  HCT 31.0* 31.0* 26.4*  WBC 10.8* 7.5 8.0  PLT 226 167 208    COAGULATION Recent Labs  Lab 10/04/17 0412  INR 1.04    CARDIAC  No results for input(s): TROPONINI in the last 168 hours. No results for input(s): PROBNP in the last 168 hours.   CHEMISTRY Recent Labs  Lab 10/03/17 0443 10/04/17 0412 10/04/17 2149 10/05/17 0300 10/06/17 0216  NA 135 138 139 136 141  K 4.2 4.0 3.7 3.9 3.7  CL 110 112* 115* 112* 115*  CO2 16* 17* 16* 16* 19*  GLUCOSE 173* 207* 145* 144* 130*  BUN 48* 44* 40* 40* 37*  CREATININE 2.87* 1.63* 1.65* 1.67* 1.93*  CALCIUM 7.8* 8.4* 8.1* 7.7* 8.1*  MG  --   --   --   --  1.7  PHOS  --   --   --   --  2.9   Estimated Creatinine Clearance: 44 mL/min (A) (by C-G formula based on SCr of 1.93 mg/dL (H)).   LIVER Recent Labs  Lab 10/02/17 0948 10/03/17 0443 10/04/17 0412 10/05/17 0300 10/06/17 0216  AST 89* 43* 53* 103* 83*  ALT 259* 156* 115* 176* 144*  ALKPHOS 73 56 53 143* 123  BILITOT 1.0 1.3* 1.1 3.4* 3.6*  PROT 7.5 6.6 7.0 6.2* 6.1*  ALBUMIN 3.7 3.2* 3.4* 2.6* 2.3*  INR  --   --  1.04  --   --      INFECTIOUS Recent Labs  Lab 10/02/17 1323 10/02/17 1549 10/06/17 0216  LATICACIDVEN 2.48* 1.33 1.1     ENDOCRINE CBG (last 3)  Recent Labs    10/05/17 1939 10/05/17 2336 10/06/17 0328  GLUCAP 136* 123* 123*         IMAGING x48h  - image(s) personally visualized  -   highlighted in bold Ct Abdomen Pelvis Wo Contrast  Result Date: 10/04/2017 CLINICAL DATA:  Fever, chills, abdominal pain, nausea and vomiting. Elevated liver function studies. EXAM: CT ABDOMEN AND PELVIS WITHOUT CONTRAST TECHNIQUE: Multidetector CT imaging of the abdomen and pelvis was  performed following the standard protocol without IV contrast. COMPARISON:  MRI abdomen 10/03/2017 FINDINGS: Lower chest: Small to moderate-sized bilateral pleural effusions with overlying atelectasis. Mild cardiac enlargement. Coronary artery calcifications. There is an NG tube coursing down the esophagus and into the stomach. Hepatobiliary: The gallbladder is distended and contains gallstones. Surrounding inflammatory changes suspicious for acute cholecystitis. Mild common bile duct dilatation with maximum diameter of 8.5 mm. No obvious  common bile duct stones. Pancreas: No mass, inflammation or ductal dilatation. Duodenum diverticulum noted near the pancreatic head. Spleen: Normal size.  No focal lesions. Adrenals/Urinary Tract: The adrenal glands and kidneys are unremarkable. Right renal cyst is noted. The bladder contains a Foley catheter. Stomach/Bowel: The stomach, duodenum, small bowel and colon are grossly normal without oral contrast. 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 There is an NG tube in the stomach. Vascular/Lymphatic: Scattered aortic calcifications but no aneurysm. Small scattered mesenteric and retroperitoneal lymph nodes but no mass or adenopathy. Reproductive: The prostate gland contains brachytherapy seeds. There is a Foley catheter in the bladder. Seminal vesicles appear normal. Other: Moderate free pelvic fluid and mild free abdominal fluid. This measures as simple fluid. No pelvic mass or adenopathy. No inguinal mass or hernia. Musculoskeletal: Advanced degenerative changes involving the spine. Severe disc disease at L2-3. No acute bony findings. IMPRESSION: 1. CT findings most suggestive of acute cholecystitis. Right upper quadrant inflammatory process could also be due to duodenitis or pancreatitis but I think both of these are much less likely. 2. Bilateral pleural effusions and overlying atelectasis. 3. Small amount of free abdominal fluid and moderate amount of free pelvic fluid.  Electronically Signed   By: Marijo Sanes M.D.   On: 10/04/2017 12:41   Dg Chest Port 1 View  Result Date: 10/05/2017 CLINICAL DATA:  Hypoxia EXAM: PORTABLE CHEST 1 VIEW COMPARISON:  October 04, 2017 FINDINGS: Endotracheal tube tip is 3.2 cm above the carina. Nasogastric tube tip and side port are below the diaphragm. No pneumothorax. There is atelectatic change in the left base with small left pleural effusion. Lungs elsewhere are clear. Heart is mildly enlarged with pulmonary vascularity within normal limits. No adenopathy. No bone lesions. IMPRESSION: Tube positions as described without pneumothorax. Left base atelectasis with small left pleural effusion. Lungs elsewhere clear. Stable cardiac silhouette. Electronically Signed   By: Lowella Grip III M.D.   On: 10/05/2017 07:15   Ct Perc Cholecystostomy  Result Date: 10/04/2017 INDICATION: 72 year old with abdominal pain, sepsis and confusion. Imaging studies and physical examination are suggestive for acute cholecystitis. EXAM: IMAGE GUIDED PERCUTANEOUS CHOLECYSTOSTOMY TUBE PLACEMENT MEDICATIONS: Scheduled IV Zosyn 3.375 gm was given immediately prior to the procedure. ANESTHESIA/SEDATION: Patient was intubated and sedated prior to arriving in the Radiology department. Patient was monitored by radiology nurse throughout the procedure. FLUOROSCOPY TIME:  None COMPLICATIONS: None immediate. PROCEDURE: Informed written consent was obtained from the patient's daughter after a thorough discussion of the procedural risks, benefits and alternatives. All questions were addressed. Maximal Sterile Barrier Technique was utilized including caps, mask, sterile gowns, sterile gloves, sterile drape, hand hygiene and skin antiseptic. A timeout was performed prior to the initiation of the procedure. Patient was supine on the CT scanner. The gallbladder was identified with CT and ultrasound. The anterior abdomen was prepped and draped in sterile fashion. Skin was  anesthetized with 1% lidocaine. Using ultrasound guidance, an 18 gauge trocar needle was directed into the dilated gallbladder. A stiff Amplatz wire was advanced into the gallbladder with ultrasound guidance. The tract was dilated to accommodate a 10.2 [place] multipurpose drain. 90 mL of cloudy dark brown fluid was removed. Catheter was sutured to skin and placed to gravity bag. Follow up CT images confirm placement in the gallbladder. Ultrasound and CT images were saved for documentation. FINDINGS: Mild distention of the gallbladder with mild gallbladder wall thickening. Small amount of perihepatic ascites. Gallbladder drain was successfully placed within the gallbladder. 90 mL of cloudy brown fluid  was removed and sent for culture. IMPRESSION: Successful image guided percutaneous cholecystostomy tube placement. Electronically Signed   By: Markus Daft M.D.   On: 10/04/2017 14:44       DISCUSSION: 72 year old man with hypertension, diabetes, sleep apnea, GERD.  He presents with abdominal pain, transaminitis and has evolved an acute abdomen.  Unclear as to whether this reflects small bowel obstruction, bowel injury.  Cholangitis also being considered but compromised radiographical evaluation not entirely consistent.  General surgery and GI following.  He needs definitive imaging in order to plan next steps.  He will not be able to get this as long as he is agitated, tachypneic with compromised respiratory status.  He will be sedated, intubated to facilitate work up and trreatment  ASSESSMENT / PLAN:  PULMONARY A: Acute respiratory failure  10/06/2017 - meets extubation criteria  P:   Extubate 10/06/2017 VAP prevention orders  CARDIOVASCULAR A:  Hypertension, likely exacerbated by his abdominal pain, respiratory discomfort   - 10/06/2017 - not on pressors P:  metoprolol, hydralazine as needed Suspect this will improve also with adequate pain control, sedation  RENAL Recent Labs  Lab  10/03/17 0443 10/04/17 0412 10/04/17 2149 10/05/17 0300 10/06/17 0216  CREATININE 2.87* 1.63* 1.65* 1.67* 1.93*    A:   Acute renal failure, probably ATN,   10/06/2017  - urine cloudy and creat rising despite fluid bolus yesterday  P:   Repeat LR bolus Monitor If still rising, consider [place] ortry diuresis   GASTROINTESTINAL A:   Abdominal pain, evolving acute abdomen Transaminitis Possible Pancreat lesion on MRI at admit :   Etiology unclear.  Some aspects of his exam and imaging have suggested possible gallbladder disease, but other than consistent with cholangitis.  Studies have been compromised by technique.. S/p  percutaneous cholecystostomy tube.  PLAN H2 blockade Continue gall bladder drain OPD fu pancreatic lesion needed NPO post extubation  HEMATOLOGIC A:   Mild leukocytosis Relative anemia, normocytic, likely due to acute illness P:  Follow CBC - PRBC for hgb </= 6.9gm%    - exceptions are   -  if ACS susepcted/confirmed then transfuse for hgb </= 8.0gm%,  or    -  active bleeding with hemodynamic instability, then transfuse regardless of hemoglobin value   At at all times try to transfuse 1 unit prbc as possible with exception of active hemorrhage    INFECTIOUS A:   Suspect SBO, possible bowel ischemia Possible cholangitis -Viral hepatitis panel negative -Flu panel negative P:   On empiric Zosyn at this time. Blood cultures pending  ENDOCRINE A:   Diabetes mellitus P:    ICU hyperglycemia protocol  NEUROLOGIC A:   Abdominal pain Sedation for MV  - normal WUA  P:   RASS goal: -1 Dc diprivan fent prn for pain  FAMILY  - Updates: discussed acute issues, plans with his daughter at bedside 1/18. Wife updated at bedside 10/06/17  - Inter-disciplinary family meet or Palliative Care meeting due by:  10/11/17   The patient is critically ill with multiple organ systems failure and requires high complexity decision making for assessment and  support, frequent evaluation and titration of therapies, application of advanced monitoring technologies and extensive interpretation of multiple databases.   Critical Care Time devoted to patient care services described in this note is  30  Minutes. This time reflects time of care of this signee Dr Brand Males. This critical care time does not reflect procedure time, or teaching time or supervisory time  of PA/NP/Med student/Med Resident etc but could involve care discussion time    Dr. Brand Males, M.D., [facility].C.P Pulmonary and Critical Care Medicine Staff Physician [facility] Pager: [phone], If no answer or between  15:00h - 7:00h: call [phone]  10/06/2017 9:52 AM

## 2017-10-06 NOTE — Plan of Care (Signed)
  Pain Managment: General experience of comfort will improve 10/06/2017 2105 - Progressing by Mickie Kay, RN

## 2017-10-06 NOTE — Plan of Care (Signed)
  Progressing Health Behavior/Discharge Planning: Ability to manage health-related needs will improve 10/06/2017 1634 - Progressing by Orma Render, RN Clinical Measurements: Ability to maintain clinical measurements within normal limits will improve 10/06/2017 1634 - Progressing by Orma Render, RN Diagnostic test results will improve 10/06/2017 1634 - Progressing by Orma Render, RN Respiratory complications will improve 10/06/2017 1634 - Progressing by Orma Render, RN Cardiovascular complication will be avoided 10/06/2017 1634 - Progressing by Orma Render, RN Activity: Risk for activity intolerance will decrease 10/06/2017 1634 - Progressing by Orma Render, RN Nutrition: Adequate nutrition will be maintained 10/06/2017 1634 - Progressing by Orma Render, RN Coping: Level of anxiety will decrease 10/06/2017 1634 - Progressing by Orma Render, RN Elimination: Will not experience complications related to urinary retention 10/06/2017 1634 - Progressing by Orma Render, RN Pain Managment: General experience of comfort will improve 10/06/2017 1634 - Progressing by Orma Render, RN Safety: Ability to remain free from injury will improve 10/06/2017 1634 - Progressing by Orma Render, RN Skin Integrity: Risk for impaired skin integrity will decrease 10/06/2017 1634 - Progressing by Clariece Roesler, Coralee Pesa, RN

## 2017-10-06 NOTE — Progress Notes (Signed)
[facility]:  [phone] General Surgery Progress Note   LOS: 4 days  POD -     Chief Complaint: Confusion, sepsis  Assessment and Plan: 1.  Cholecystitis  Perc drain of gall bladder - 10/04/2017   WBC - 8,000 - 10/07/2017  T. Bili - 3.6 - 10/07/2017  Zosyn - 1/16 >>>  If extubated - OG tube can come out and he can be started on PO's  2.  Sepsis 3.  VDRF  More alert today.  It looks like he can be weaned. 4.  A Fib - now back in sinus rythm 5.  Creatinine - 1.67 - 10/05/2017 6.  DVT prophylaxis - SQ Heparin   Principal Problem:   Acute cholecystitis s/p perc cholecystomy drainage 10/04/2017 Active Problems:   HYPERTENSION, BENIGN ESSENTIAL, UNCONTROLLED   Abdominal pain   Sepsis ([place])   Acute respiratory failure with hypoxia ([place])   AKI (acute kidney injury) ([place])   Septic shock ([facility])   Subjective:  Intubated, but alert.  Wife, Almi, at bedside.  She is from the [place].  They were married in March 2018.  Objective:   Vitals:   10/06/17 0800 10/06/17 0822  BP: (!) 143/68   Pulse: 83   Resp: (!) 24   Temp: (!) 101.5 F (38.6 C)   SpO2: 98% 95%     Intake/Output from previous day:  01/19 0701 - 01/20 0700 In: 2744.5 [I.V.:1444.5; IV Piggyback:1300] Out: 1540 [Urine:1275; Drains:160]  Intake/Output this shift:  No intake/output data recorded.   Physical Exam:   General: Older WM who is intubated.  But he is alert and responds.   HEENT: Normal. Pupils equal.  NGT in place, but it is probably just in the esophagus.  It will hopefully come out if he gets extubated. .   Lungs: Rhonchi in the bases   Abdomen: Less distended.  Few BS.   Wound: Drain in RUQ, 160 cc recorded last 24 hours   Lab Results:    Recent Labs    10/05/17 0300 10/06/17 0719  WBC 7.5 8.0  HGB 10.2* 8.9*  HCT 31.0* 26.4*  PLT 167 208    BMET   Recent Labs    10/05/17 0300 10/06/17 0216  NA 136 141  K 3.9 3.7  CL 112* 115*  CO2 16* 19*  GLUCOSE 144*  130*  BUN 40* 37*  CREATININE 1.67* 1.93*  CALCIUM 7.7* 8.1*    PT/INR   Recent Labs    10/04/17 0412  LABPROT 13.5  INR 1.04    ABG   Recent Labs    10/04/17 1250  PHART 7.358  HCO3 16.1*     Studies/Results:  Ct Abdomen Pelvis Wo Contrast  Result Date: 10/04/2017 CLINICAL DATA:  Fever, chills, abdominal pain, nausea and vomiting. Elevated liver function studies. EXAM: CT ABDOMEN AND PELVIS WITHOUT CONTRAST TECHNIQUE: Multidetector CT imaging of the abdomen and pelvis was performed following the standard protocol without IV contrast. COMPARISON:  MRI abdomen 10/03/2017 FINDINGS: Lower chest: Small to moderate-sized bilateral pleural effusions with overlying atelectasis. Mild cardiac enlargement. Coronary artery calcifications. There is an NG tube coursing down the esophagus and into the stomach. Hepatobiliary: The gallbladder is distended and contains gallstones. Surrounding inflammatory changes suspicious for acute cholecystitis. Mild common bile duct dilatation with maximum diameter of 8.5 mm. No obvious common bile duct stones. Pancreas: No mass, inflammation or ductal dilatation. Duodenum diverticulum noted near the pancreatic head. Spleen: Normal size.  No focal lesions. Adrenals/Urinary Tract:  The adrenal glands and kidneys are unremarkable. Right renal cyst is noted. The bladder contains a Foley catheter. Stomach/Bowel: The stomach, duodenum, small bowel and colon are grossly normal without oral contrast. 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 There is an NG tube in the stomach. Vascular/Lymphatic: Scattered aortic calcifications but no aneurysm. Small scattered mesenteric and retroperitoneal lymph nodes but no mass or adenopathy. Reproductive: The prostate gland contains brachytherapy seeds. There is a Foley catheter in the bladder. Seminal vesicles appear normal. Other: Moderate free pelvic fluid and mild free abdominal fluid. This measures as simple fluid. No pelvic mass or adenopathy.  No inguinal mass or hernia. Musculoskeletal: Advanced degenerative changes involving the spine. Severe disc disease at L2-3. No acute bony findings. IMPRESSION: 1. CT findings most suggestive of acute cholecystitis. Right upper quadrant inflammatory process could also be due to duodenitis or pancreatitis but I think both of these are much less likely. 2. Bilateral pleural effusions and overlying atelectasis. 3. Small amount of free abdominal fluid and moderate amount of free pelvic fluid. Electronically Signed   By: Marijo Sanes M.D.   On: 10/04/2017 12:41   Dg Chest Port 1 View  Result Date: 10/05/2017 CLINICAL DATA:  Hypoxia EXAM: PORTABLE CHEST 1 VIEW COMPARISON:  October 04, 2017 FINDINGS: Endotracheal tube tip is 3.2 cm above the carina. Nasogastric tube tip and side port are below the diaphragm. No pneumothorax. There is atelectatic change in the left base with small left pleural effusion. Lungs elsewhere are clear. Heart is mildly enlarged with pulmonary vascularity within normal limits. No adenopathy. No bone lesions. IMPRESSION: Tube positions as described without pneumothorax. Left base atelectasis with small left pleural effusion. Lungs elsewhere clear. Stable cardiac silhouette. Electronically Signed   By: Lowella Grip III M.D.   On: 10/05/2017 07:15   Dg Chest Port 1 View  Result Date: 10/04/2017 CLINICAL DATA:  Endotracheal and orogastric tube placement. EXAM: PORTABLE CHEST 1 VIEW COMPARISON:  Radiograph October 02, 2017. FINDINGS: Stable cardiomediastinal silhouette. Endotracheal tube is seen projected over tracheal air shadow with distal tip 3 cm above the carina. Orogastric tube is seen passing through esophagus into stomach. Distal tip is not clearly visualized. Mild central pulmonary vascular congestion is noted. Mild bibasilar subsegmental atelectasis is noted. No pneumothorax is noted. Bony thorax is unremarkable. IMPRESSION: Endotracheal and orogastric tubes are in grossly good  position. Mild central pulmonary vascular congestion is noted. Mild bibasilar subsegmental atelectasis is noted. Electronically Signed   By: Marijo Conception, M.D.   On: 10/04/2017 09:55   Dg Abd Portable 1v  Result Date: 10/04/2017 CLINICAL DATA:  Orogastric tube placement. EXAM: PORTABLE ABDOMEN - 1 VIEW COMPARISON:  Radiographs of October 02, 2017. FINDINGS: No definite abnormal bowel dilatation is seen currently. Air-filled colon is noted. Distal tip of orogastric tube is seen in proximal stomach. IMPRESSION: Distal tip of orogastric tube seen in proximal stomach. Electronically Signed   By: Marijo Conception, M.D.   On: 10/04/2017 09:56   Ct Perc Cholecystostomy  Result Date: 10/04/2017 INDICATION: 72 year old with abdominal pain, sepsis and confusion. Imaging studies and physical examination are suggestive for acute cholecystitis. EXAM: IMAGE GUIDED PERCUTANEOUS CHOLECYSTOSTOMY TUBE PLACEMENT MEDICATIONS: Scheduled IV Zosyn 3.375 gm was given immediately prior to the procedure. ANESTHESIA/SEDATION: Patient was intubated and sedated prior to arriving in the Radiology department. Patient was monitored by radiology nurse throughout the procedure. FLUOROSCOPY TIME:  None COMPLICATIONS: None immediate. PROCEDURE: Informed written consent was obtained from the patient's daughter after a thorough discussion of  the procedural risks, benefits and alternatives. All questions were addressed. Maximal Sterile Barrier Technique was utilized including caps, mask, sterile gowns, sterile gloves, sterile drape, hand hygiene and skin antiseptic. A timeout was performed prior to the initiation of the procedure. Patient was supine on the CT scanner. The gallbladder was identified with CT and ultrasound. The anterior abdomen was prepped and draped in sterile fashion. Skin was anesthetized with 1% lidocaine. Using ultrasound guidance, an 18 gauge trocar needle was directed into the dilated gallbladder. A stiff Amplatz wire was  advanced into the gallbladder with ultrasound guidance. The tract was dilated to accommodate a 10.2 [place] multipurpose drain. 90 mL of cloudy dark brown fluid was removed. Catheter was sutured to skin and placed to gravity bag. Follow up CT images confirm placement in the gallbladder. Ultrasound and CT images were saved for documentation. FINDINGS: Mild distention of the gallbladder with mild gallbladder wall thickening. Small amount of perihepatic ascites. Gallbladder drain was successfully placed within the gallbladder. 90 mL of cloudy brown fluid was removed and sent for culture. IMPRESSION: Successful image guided percutaneous cholecystostomy tube placement. Electronically Signed   By: Markus Daft M.D.   On: 10/04/2017 14:44     Anti-infectives:   Anti-infectives (From admission, onward)   Start     Dose/Rate Route Frequency Ordered Stop   10/02/17 2200  piperacillin-tazobactam (ZOSYN) IVPB 3.375 g     3.375 g 12.5 mL/hr over 240 Minutes Intravenous Every 8 hours 10/02/17 1257     10/02/17 1400  vancomycin (VANCOCIN) IVPB 1000 mg/200 mL premix  Status:  Discontinued     1,000 mg 200 mL/hr over 60 Minutes Intravenous Every 24 hours 10/02/17 1257 10/02/17 1843   10/02/17 1300  piperacillin-tazobactam (ZOSYN) IVPB 3.375 g     3.375 g 100 mL/hr over 30 Minutes Intravenous  Once 10/02/17 1257 10/02/17 1352      Alphonsa Overall, MD, FACS Pager: [facility] Office: [phone] 10/06/2017

## 2017-10-07 ENCOUNTER — Inpatient Hospital Stay (HOSPITAL_COMMUNITY): Payer: Medicare HMO

## 2017-10-07 LAB — CBC WITH DIFFERENTIAL/PLATELET
BASOS PCT: 0 %
Basophils Absolute: 0 10*3/uL (ref 0.0–0.1)
EOS ABS: 0.2 10*3/uL (ref 0.0–0.7)
EOS PCT: 2 %
HEMATOCRIT: 29.4 % — AB (ref 39.0–52.0)
Hemoglobin: 9.8 g/dL — ABNORMAL LOW (ref 13.0–17.0)
Lymphocytes Relative: 10 %
Lymphs Abs: 1.2 10*3/uL (ref 0.7–4.0)
MCH: 28.4 pg (ref 26.0–34.0)
MCHC: 33.3 g/dL (ref 30.0–36.0)
MCV: 85.2 fL (ref 78.0–100.0)
MONO ABS: 0.8 10*3/uL (ref 0.1–1.0)
MONOS PCT: 7 %
Neutro Abs: 9.7 10*3/uL — ABNORMAL HIGH (ref 1.7–7.7)
Neutrophils Relative %: 81 %
PLATELETS: 297 10*3/uL (ref 150–400)
RBC: 3.45 MIL/uL — ABNORMAL LOW (ref 4.22–5.81)
RDW: 14.5 % (ref 11.5–15.5)
WBC: 11.9 10*3/uL — ABNORMAL HIGH (ref 4.0–10.5)

## 2017-10-07 LAB — TROPONIN I
TROPONIN I: 0.03 ng/mL — AB (ref <0.03)
Troponin I: 0.06 ng/mL (ref <0.03)
Troponin I: 0.05 ng/mL (ref <0.03)
Troponin I: 0.03 ng/mL (ref <0.03)

## 2017-10-07 LAB — BASIC METABOLIC PANEL
Anion gap: 8 (ref 5–15)
BUN: 33 mg/dL — AB (ref 6–20)
CALCIUM: 8.4 mg/dL — AB (ref 8.9–10.3)
CO2: 18 mmol/L — AB (ref 22–32)
CREATININE: 1.77 mg/dL — AB (ref 0.61–1.24)
Chloride: 113 mmol/L — ABNORMAL HIGH (ref 101–111)
GFR calc Af Amer: 43 mL/min — ABNORMAL LOW (ref >60)
GFR calc non Af Amer: 37 mL/min — ABNORMAL LOW (ref >60)
GLUCOSE: 141 mg/dL — AB (ref 65–99)
Potassium: 3.4 mmol/L — ABNORMAL LOW (ref 3.5–5.1)
Sodium: 139 mmol/L (ref 135–145)

## 2017-10-07 LAB — CULTURE, BODY FLUID W GRAM STAIN -BOTTLE

## 2017-10-07 LAB — GLUCOSE, CAPILLARY
GLUCOSE-CAPILLARY: 134 mg/dL — AB (ref 65–99)
GLUCOSE-CAPILLARY: 187 mg/dL — AB (ref 65–99)
Glucose-Capillary: 163 mg/dL — ABNORMAL HIGH (ref 65–99)
Glucose-Capillary: 204 mg/dL — ABNORMAL HIGH (ref 65–99)
Glucose-Capillary: 194 mg/dL — ABNORMAL HIGH (ref 65–99)
Glucose-Capillary: 177 mg/dL — ABNORMAL HIGH (ref 65–99)

## 2017-10-07 LAB — COMPREHENSIVE METABOLIC PANEL
ALBUMIN: 2.2 g/dL — AB (ref 3.5–5.0)
ALK PHOS: 96 U/L (ref 38–126)
ALT: 75 U/L — ABNORMAL HIGH (ref 17–63)
ANION GAP: 7 (ref 5–15)
AST: 30 U/L (ref 15–41)
BILIRUBIN TOTAL: 2 mg/dL — AB (ref 0.3–1.2)
BUN: 36 mg/dL — AB (ref 6–20)
CALCIUM: 8.4 mg/dL — AB (ref 8.9–10.3)
CO2: 19 mmol/L — ABNORMAL LOW (ref 22–32)
Chloride: 114 mmol/L — ABNORMAL HIGH (ref 101–111)
Creatinine, Ser: 1.85 mg/dL — ABNORMAL HIGH (ref 0.61–1.24)
GFR calc Af Amer: 41 mL/min — ABNORMAL LOW (ref >60)
GFR calc non Af Amer: 36 mL/min — ABNORMAL LOW (ref >60)
GLUCOSE: 215 mg/dL — AB (ref 65–99)
Potassium: 3.9 mmol/L (ref 3.5–5.1)
Sodium: 140 mmol/L (ref 135–145)
TOTAL PROTEIN: 6.3 g/dL — AB (ref 6.5–8.1)

## 2017-10-07 LAB — PHOSPHORUS: Phosphorus: 3.2 mg/dL (ref 2.5–4.6)

## 2017-10-07 LAB — CULTURE, BODY FLUID-BOTTLE

## 2017-10-07 LAB — CREATININE, SERUM
Creatinine, Ser: 1.83 mg/dL — ABNORMAL HIGH (ref 0.61–1.24)
GFR calc Af Amer: 42 mL/min — ABNORMAL LOW (ref >60)
GFR, EST NON AFRICAN AMERICAN: 36 mL/min — AB (ref >60)

## 2017-10-07 LAB — PROCALCITONIN: Procalcitonin: 6.39 ng/mL

## 2017-10-07 LAB — MAGNESIUM: Magnesium: 1.9 mg/dL (ref 1.7–2.4)

## 2017-10-07 LAB — TRIGLYCERIDES: Triglycerides: 362 mg/dL — ABNORMAL HIGH (ref <150)

## 2017-10-07 MED ORDER — ASPIRIN 81 MG PO CHEW
81.0000 mg | CHEWABLE_TABLET | Freq: Every day | ORAL | Status: DC
  Administered 2017-10-07 – 2017-10-11 (×5): 81 mg via ORAL
  Filled 2017-10-07 (×5): qty 1

## 2017-10-07 NOTE — Progress Notes (Signed)
Independently examined pt, evaluated data & formulated above care plan with NP/resident    72 year old obese hypertensive diabetic admitted 1/16 with sepsis due to ascending cholangitis and AK I, underwent cholecystostomy, extubated 1/21 Continues to have distended abdomen Complains of nausea On exam-distended, nontender, afebrile, S1-S2 tacky, 1+ bipedal edema  Labs and imaging reviewed -creatinine is stable procalcitonin decreasing X-ray abdomen shows small bowel obstruction  Impression/plan Acute respiratory failure-resolving Small bowel obstruction-reinsert NG tube in place to low intermittent suction AKI -improving, monitor lites Sepsis/acute cholangitis -discontinue vancomycin, continue Zosyn, cultures negative so far  Rakesh V. Elsworth Soho MD

## 2017-10-07 NOTE — Progress Notes (Signed)
[facility] Progress Note     Subjective: CC: emesis Patient throwing up bilious emesis this AM. Per family member present he was drinking too much liquid at once, patient thinks emesis was more related to movement. Nausea easing off currently. Denies abdominal pain, passing flatus and having stools. Patient wanting to be able to get up out of bed more.  Tmax up to 102.2. UOP good  Objective: Vital signs in last 24 hours: Temp:  [98.5 F (36.9 C)-102.2 F (39 C)] 99.9 F (37.7 C) (01/21 0600) Pulse Rate:  [68-113] 73 (01/21 0600) Resp:  [24-45] 28 (01/21 0600) BP: (158-199)/(68-160) 169/78 (01/21 0600) SpO2:  [95 %-100 %] 98 % (01/21 0600) FiO2 (%):  [30 %] 30 % (01/20 0822) Last BM Date: 10/06/17  Intake/Output from previous day: 01/20 0701 - 01/21 0700 In: 1513 [I.V.:803; IV Piggyback:700] Out: 1715 [Urine:1575; Drains:140] Intake/Output this shift: No intake/output data recorded.  PE: Gen:  Alert, NAD, pleasant Card:  Regular rate and rhythm, pedal pulses 2+ BL, no LE edema BL  Pulm:  Normal effort, mild bibasilar rales Abd: non-tender, distended, bowel sounds hypoactive, drain in RUQ with serous output Skin: warm and dry, no rashes  Psych: A&Ox3   Lab Results:  Recent Labs    10/06/17 0719 10/07/17 0336  WBC 8.0 11.9*  HGB 8.9* 9.8*  HCT 26.4* 29.4*  PLT 208 297   BMET Recent Labs    10/06/17 0216 10/07/17 0336  NA 141 139  K 3.7 3.4*  CL 115* 113*  CO2 19* 18*  GLUCOSE 130* 141*  BUN 37* 33*  CREATININE 1.93* 1.83*  1.77*  CALCIUM 8.1* 8.4*   PT/INR No results for input(s): LABPROT, INR in the last 72 hours. CMP     Component Value Date/Time   NA 139 10/07/2017 0336   K 3.4 (L) 10/07/2017 0336   CL 113 (H) 10/07/2017 0336   CO2 18 (L) 10/07/2017 0336   GLUCOSE 141 (H) 10/07/2017 0336   BUN 33 (H) 10/07/2017 0336   CREATININE 1.77 (H) 10/07/2017 0336   CREATININE 1.83 (H) 10/07/2017 0336   CALCIUM 8.4 (L) 10/07/2017 0336   PROT 6.1 (L) 10/06/2017 0216   ALBUMIN 2.3 (L) 10/06/2017 0216   AST 83 (H) 10/06/2017 0216   ALT 144 (H) 10/06/2017 0216   ALKPHOS 123 10/06/2017 0216   BILITOT 3.6 (H) 10/06/2017 0216   GFRNONAA 37 (L) 10/07/2017 0336   GFRNONAA 36 (L) 10/07/2017 0336   GFRAA 43 (L) 10/07/2017 0336   GFRAA 42 (L) 10/07/2017 0336   Lipase     Component Value Date/Time   LIPASE 19 10/05/2017 0300       Studies/Results: No results found.  Anti-infectives: Anti-infectives (From admission, onward)   Start     Dose/Rate Route Frequency Ordered Stop   10/07/17 1200  vancomycin (VANCOCIN) IVPB 1000 mg/200 mL premix     1,000 mg 200 mL/hr over 60 Minutes Intravenous Every 24 hours 10/06/17 1114     10/06/17 1200  vancomycin (VANCOCIN) 1,500 mg in sodium chloride 0.9 % 500 mL IVPB     1,500 mg 250 mL/hr over 120 Minutes Intravenous  Once 10/06/17 1114 10/06/17 1423   10/02/17 2200  piperacillin-tazobactam (ZOSYN) IVPB 3.375 g     3.375 g 12.5 mL/hr over 240 Minutes Intravenous Every 8 hours 10/02/17 1257     10/02/17 1400  vancomycin (VANCOCIN) IVPB 1000 mg/200 mL premix  Status:  Discontinued     1,000 mg 200  mL/hr over 60 Minutes Intravenous Every 24 hours 10/02/17 1257 10/02/17 1843   10/02/17 1300  piperacillin-tazobactam (ZOSYN) IVPB 3.375 g     3.375 g 100 mL/hr over 30 Minutes Intravenous  Once 10/02/17 1257 10/02/17 1352       Assessment/Plan HYPERTENSION, BENIGN ESSENTIAL, UNCONTROLLED Acute respiratory failure with hypoxia  - patient extubated 1/20 AKI (acute kidney injury) - Cr 1.83 Atrial fibrillation - now in NSR    Sepsis/Septic shock    Acute cholecystitis s/p perc cholecystomy drainage 10/04/2017 - drain with 140 cc/24h - WBC - 11.9 today, trending back up; Tmax up to 102.2 but curve trending down this AM - T. Bili - 3.6 10/06/17 - continue Zosyn  - patient with n/v this AM - if continues will need NGT decompression. Can hold off on NGT for right now and treat with fluid  replacement and prn antiemetics. Check KUB  - mobilize as tolerated  FEN: CLD, IVF VTE: SCDs, SQ heparin  ID: IV zosyn 1/16>>; IV vanc 1/20>>  Patient appears dehydrated, will increase IVF rate slightly. Check KUB. Recheck labs PM, if bili increases further may need repeat MRCP and GI consult. Allow sips of clears as tolerated but NPO if further n/v. PRN antiemetics. PT/OT.                LOS: 5 days    Brigid Re , [facility] 10/07/2017, 8:03 AM Pager: [phone] Mon-Fri 7:00 am-4:30 pm Sat-Sun 7:00 am-11:30 am

## 2017-10-07 NOTE — Progress Notes (Signed)
PULMONARY / CRITICAL CARE MEDICINE   Name: Timothy Chapman MRN: [id number] DOB: May 24, 1946    ADMISSION DATE:  10/02/2017 CONSULTATION DATE: 10/04/17  REFERRING MD:  Dr Sloan Leiter, [facility]  Reason for consultation: Agitation, tachypnea, in the setting of acute abdomen   BRIEF SUMMARY: 72 year old obese gentleman with a history of hypertension, diabetes, obstructive sleep apnea, GERD.  Admitted on 1/16 with abdominal pain, nausea with vomiting, fever and chills for proximally 3 days.  His evaluation revealed a transaminitis, acute renal failure.  Initial x-rays suggested a small bowel obstruction and he was kept n.p.o.  Concern for either a viral hepatitis or cholangitis.  Unfortunately his course been complicated by some respiratory distress and agitation, delirium likely due to his evolving underlying illness.  Imaging has not been definitive- HIDA scan did not show occlusion of the common bile duct, MRCP was a poor study but the common bile duct was 7 mm in size.  Transaminases and renal function slightly improved but patient overall clinically worsening.  He moved to the ICU for stabilization in order to facilitate next steps including better abdominal imaging, possible cholecystostomy or surgical procedure.   SUBJECTIVE:  Tmax 102.2 / WBC 11.9.     VITAL SIGNS: BP (!) 162/77 (BP Location: Right Arm)   Pulse 76   Temp 99.3 F (37.4 C) (Core)   Resp (!) 31   Ht 5\' 9"  (1.753 m)   Wt 241 lb 2.9 oz (109.4 kg)   SpO2 94%   BMI 35.62 kg/m   HEMODYNAMICS:    VENTILATOR SETTINGS:    INTAKE / OUTPUT: I/O last 3 completed shifts: In: 2590.8 [I.V.:1730.8; Other:10; IV Piggyback:850] Out: 3716 [Urine:2300; Drains:240]  PHYSICAL EXAMINATION: General: adult male in NAD HEENT: MM pink/moist Neuro: AAOx4, speech clear< MAE CV: s1s2 rrr, no m/r/g PULM: even/non-labored, lungs bilaterally clear RC:VELF, non-tender, bsx4 active  Extremities: warm/dry, no edema  Skin: no rashes or  lesions   PULMONARY Recent Labs  Lab 10/04/17 1250  PHART 7.358  PCO2ART 30.1*  PO2ART 401*  HCO3 16.1*  O2SAT 99.7    CBC Recent Labs  Lab 10/05/17 0300 10/06/17 0719 10/07/17 0336  HGB 10.2* 8.9* 9.8*  HCT 31.0* 26.4* 29.4*  WBC 7.5 8.0 11.9*  PLT 167 208 297    COAGULATION Recent Labs  Lab 10/04/17 0412  INR 1.04    CARDIAC   Recent Labs  Lab 10/07/17 0336 10/07/17 0745  TROPONINI 0.06* 0.05*   No results for input(s): PROBNP in the last 168 hours.   CHEMISTRY Recent Labs  Lab 10/04/17 0412 10/04/17 2149 10/05/17 0300 10/06/17 0216 10/07/17 0336  NA 138 139 136 141 139  K 4.0 3.7 3.9 3.7 3.4*  CL 112* 115* 112* 115* 113*  CO2 17* 16* 16* 19* 18*  GLUCOSE 207* 145* 144* 130* 141*  BUN 44* 40* 40* 37* 33*  CREATININE 1.63* 1.65* 1.67* 1.93* 1.83*  1.77*  CALCIUM 8.4* 8.1* 7.7* 8.1* 8.4*  MG  --   --   --  1.7 1.9  PHOS  --   --   --  2.9 3.2   Estimated Creatinine Clearance: 48 mL/min (A) (by C-G formula based on SCr of 1.77 mg/dL (H)).   LIVER Recent Labs  Lab 10/02/17 0948 10/03/17 0443 10/04/17 0412 10/05/17 0300 10/06/17 0216  AST 89* 43* 53* 103* 83*  ALT 259* 156* 115* 176* 144*  ALKPHOS 73 56 53 143* 123  BILITOT 1.0 1.3* 1.1 3.4* 3.6*  PROT 7.5 6.6  7.0 6.2* 6.1*  ALBUMIN 3.7 3.2* 3.4* 2.6* 2.3*  INR  --   --  1.04  --   --      INFECTIOUS Recent Labs  Lab 10/02/17 1323 10/02/17 1549 10/06/17 0216 10/06/17 1107 10/07/17 0336  LATICACIDVEN 2.48* 1.33 1.1  --   --   PROCALCITON  --   --   --  10.90 6.39    ENDOCRINE CBG (last 3)  Recent Labs    10/06/17 2340 10/07/17 0341 10/07/17 0824  GLUCAP 186* 134* 163*    IMAGING   Dg Abd Portable 1v  Result Date: 10/07/2017 CLINICAL DATA:  Vomiting.  History of cholecystostomy tube. EXAM: PORTABLE ABDOMEN - 1 VIEW COMPARISON:  CT abdomen pelvis-10/04/2017; ultrasound and CT guided cholecystostomy tube placement-10/04/2017 FINDINGS: There is moderate to marked  gaseous distention of multiple loops of small bowel with index loop of small bowel within the left mid hemiabdomen measuring approximately 5.2 cm in diameter. This finding is associated with a conspicuous paucity of distal colonic gas. Nondiagnostic evaluation for pneumoperitoneum secondary to supine positioning and exclusion of the lower thorax. No definite pneumatosis or portal venous gas. Cholecystostomy tube overlies expected location of the gallbladder fossa. A catheter overlies the expected location of the urinary bladder. 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 Several phleboliths overlie the lower pelvis bilaterally. IMPRESSION: Findings worrisome for developing small bowel obstruction. Clinical correlation and continued attention on follow-up is advised. Electronically Signed   By: Sandi Mariscal M.D.   On: 10/07/2017 10:10    STUDIES  KUB 1/16 >> dilated gas containing small bowel loops, paucity of large bowel gas consistent with a small bowel obstruction or small bowel ileus Right upper quadrant ultrasound 1/16 >> 2 small echogenic foci in the gallbladder, either stones or possibly polyps.  Mild pericholecystic fluid, negative sonographic Murphy, CBD 8 mm, good portal flow. HIDA scan 1/16 >> incomplete study, good uptake of tracer and rapid normal clearance, no gallbladder filling noted (study terminated early by the patient) MRCP 1/17 >> poor study due to motion, tiny gallstones in the dependent gallbladder lumen, trace pericholecystic fluid and gallbladder debris, no dilation of the main pancreatic duct, the incompletely visualized extrahepatic biliary tree measures up to 7 mm in diameter CT ABD/Pelvis 1/21 >> suggestive of acute cholecystitis, RUQ inflammatory process could also be due to duodenitis or pancreatitis but felt to be much less likely, small bilateral pleural effusions,  small amt free abdominal / pelvic fluid  ANTIBIOTICS Zosyn 1/16 >>  Vanco 1/20 >>   CULTURES Blood 1/16 >> C. Difficile PCR >> never passed stool GB Fluid 1/18 (IR) >>   LINES ETT 1/18 >> 1/21  EVENTS: 1/16  Admit 1/18  Cystostomy by IR 1/19  Not on pressors. Weaning diprivan. On SBT.   1/20  GPC's in bile fluid, SBT. Febrile.   1/21  Extubated   DISCUSSION: 72 year old man with hypertension, diabetes, sleep apnea, GERD.  He presented with abdominal pain, transaminitis and has evolved an acute abdomen.  Unclear as to whether this reflects small bowel obstruction, bowel injury.  Cholangitis also being considered but compromised radiographical evaluation not entirely consistent.  General surgery and GI following.  He decompensated 1/18 and required intubation.  CT of abd consistent with acute cholecystitis 1/18 s/p IR perc drain.    ASSESSMENT / PLAN:  PULMONARY A: Acute respiratory insufficiency - secondary to abdominal process, resolved on  2L.  Extubated 1/21.  P:   Pulmonary hygiene, IS / mobilize  Follow intermittent CXR  CARDIOVASCULAR A:  Hypertension, likely exacerbated by his abdominal pain, respiratory discomfort P:  ICU monitoring  PRN hydralazine, labetalol   RENAL A:   Acute renal failure - suspect ATN P:   D5LR @75ml /hr  Trend BMP / urinary output Replace electrolytes as indicated Avoid nephrotoxic agents, ensure adequate renal perfusion  GASTROINTESTINAL A:   Acute Cholecystitis Acute Ascending Cholangitis   Abdominal Pain, evolving acute abdomen Transaminitis SBO Possible Pancreat lesion on MRI at admit P: IR assistance appreciated  Pepcid for SUP  Will need outpatient follow up for pancreatic lesion  NPO x ice chips  Assess KUB now  HEMATOLOGIC A:   Mild leukocytosis Relative anemia, normocytic, likely due to acute illness P:  Trend CBC  Heparin for DVT prophylaxis   INFECTIOUS A:   Suspect SBO, possible bowel ischemia Possible  cholangitis -Viral hepatitis panel negative -Flu panel negative P:   Continue empiric zosyn as above  Follow cultures above Trend PCT  ENDOCRINE A:   Diabetes mellitus P:   ICU SSI   NEUROLOGIC A:   Pain  Sedation Need / Intubated P:   RASS goal:n/a  PRN fentanyl for pain   FAMILY  - Updates: Family updated per Dr. Elsworth Soho  - Global:  To [facility] as of 1/22 am.  PCCM will sign off.     Noe Gens, NP-C [facility] Pgr: [phone] or if no answer [phone] 10/08/2017, 9:01 AM

## 2017-10-07 NOTE — Progress Notes (Signed)
[facility] Progress Note Patient Name: Timothy Chapman [facility] DOB: [birth year] MRN: [id number]   Date of Service  10/07/2017  HPI/Events of Note  Troponin  #1 = 0.06.  eICU Interventions  Will order: 1. 12 Lead EKG now.  2. ASA 81 mg PO now and Q day.  3. Continue to trend Troponin.      Intervention Category Major Interventions: Other:  Lysle Dingwall 10/07/2017, 5:16 AM

## 2017-10-07 NOTE — Evaluation (Addendum)
Physical Therapy Evaluation Patient Details Name: Timothy Chapman MRN: [id number] DOB: Aug 17, 1946 Today's Date: 10/07/2017   History of Present Illness  Pt admitted with vomiting/diarrhea and dx with Cholecystis and multiple organ failure.  Cholecystostomy tube placement 10/04/17.  Pt with hx of DM, L TKR and back sx x 2.  Clinical Impression  Pt admitted as above and presenting with functional mobility limitations 2* generalized weakness and ambulatory balance deficits.  Pt is motivated  to return home with family assist.    Follow Up Recommendations Home health PT    Equipment Recommendations  Other (comment)(TBD)    Recommendations for Other Services OT consult     Precautions / Restrictions Precautions Precautions: Fall Restrictions Weight Bearing Restrictions: No      Mobility  Bed Mobility Overal bed mobility: Needs Assistance Bed Mobility: Supine to Sit     Supine to sit: Min assist;Mod assist;+2 for physical assistance;+2 for safety/equipment;HOB elevated     General bed mobility comments: cues for sequence, HOB elevated, pt utilizing bedrails  Transfers Overall transfer level: Needs assistance Equipment used: Rolling walker (2 wheeled) Transfers: Sit to/from Stand Sit to Stand: From elevated surface;Min assist;Mod assist;+2 physical assistance;+2 safety/equipment         General transfer comment: cues for use of UEs to self assist and physical assist to bring wt up and fwd and to balance in standing  Ambulation/Gait Ambulation/Gait assistance: Min assist;Mod assist;+2 physical assistance;+2 safety/equipment Ambulation Distance (Feet): 32 Feet Assistive device: Rolling walker (2 wheeled) Gait Pattern/deviations: Step-to pattern;Step-through pattern;Decreased step length - right;Decreased step length - left;Shuffle;Trunk flexed Gait velocity: decr Gait velocity interpretation: Below normal speed for age/gender General Gait Details: cues for posture,  position from RW, pacing and safety awareness. Physical assist for balance/support and to manage RW  Stairs            Wheelchair Mobility    Modified Rankin (Stroke Patients Only)       Balance Overall balance assessment: Needs assistance Sitting-balance support: Feet supported;Bilateral upper extremity supported Sitting balance-Leahy Scale: Fair     Standing balance support: Bilateral upper extremity supported Standing balance-Leahy Scale: Poor                               Pertinent Vitals/Pain Pain Assessment: No/denies pain    Home Living Family/patient expects to be discharged to:: Private residence Living Arrangements: Spouse/significant other Available Help at Discharge: Family Type of Home: House Home Access: Stairs to enter Entrance Stairs-Rails: None Technical brewer of Steps: 2 Home Layout: One level Home Equipment: Environmental consultant - 2 wheels      Prior Function Level of Independence: Independent               Hand Dominance        Extremity/Trunk Assessment   Upper Extremity Assessment Upper Extremity Assessment: Generalized weakness    Lower Extremity Assessment Lower Extremity Assessment: Generalized weakness       Communication   Communication: No difficulties  Cognition Arousal/Alertness: Awake/alert Behavior During Therapy: WFL for tasks assessed/performed Overall Cognitive Status: Within Functional Limits for tasks assessed                                        General Comments      Exercises     Assessment/Plan    PT Assessment Patient needs  continued PT services  PT Problem List Decreased strength;Decreased range of motion;Decreased activity tolerance;Decreased balance;Decreased mobility;Decreased knowledge of use of DME;Obesity       PT Treatment Interventions DME instruction;Gait training;Stair training;Therapeutic activities;Functional mobility training;Therapeutic  exercise;Patient/family education    PT Goals (Current goals can be found in the Care Plan section)  Acute Rehab PT Goals Patient Stated Goal: Get up and move PT Goal Formulation: With patient Time For Goal Achievement: 10/21/17 Potential to Achieve Goals: Fair    Frequency 7X/week   Barriers to discharge        Co-evaluation               AM-PAC PT "6 Clicks" Daily Activity  Outcome Measure Difficulty turning over in bed (including adjusting bedclothes, sheets and blankets)?: Unable Difficulty moving from lying on back to sitting on the side of the bed? : Unable Difficulty sitting down on and standing up from a chair with arms (e.g., wheelchair, bedside commode, etc,.)?: Unable Help needed moving to and from a bed to chair (including a wheelchair)?: A Lot Help needed walking in hospital room?: A Lot Help needed climbing 3-5 steps with a railing? : Total 6 Click Score: 8    End of Session Equipment Utilized During Treatment: Oxygen Activity Tolerance: Patient limited by fatigue Patient left: in chair;with call bell/phone within reach;with family/visitor present;with nursing/sitter in room Nurse Communication: Mobility status PT Visit Diagnosis: Unsteadiness on feet (R26.81);Muscle weakness (generalized) (M62.81);Difficulty in walking, not elsewhere classified (R26.2)    Time: 1315-1400 PT Time Calculation (min) (ACUTE ONLY): 45 min   Charges:   PT Evaluation $PT Eval Low Complexity: 1 Low PT Treatments $Gait Training: 8-22 mins $Therapeutic Activity: 8-22 mins   PT G Codes:        Pg [phone]   Famous Eisenhardt 10/07/2017, 3:59 PM

## 2017-10-07 NOTE — Progress Notes (Signed)
Referring Physician(s): Ingram,H  Supervising Physician: Jacqulynn Cadet  Patient Status:  Timothy Chapman - In-pt  Chief Complaint:  cholecystitis  Subjective: Pt awake, sl drowsy; has had N/V; some soreness at GB drain site   Allergies: Codeine  Medications: Prior to Admission medications   Medication Sig Start Date End Date Taking? Authorizing Provider  Alpha-Lipoic Acid 600 MG CAPS Take 600 mg by mouth daily.   Yes [provider]  benazepril (LOTENSIN) 20 MG tablet Take 1 tablet (20 mg total) by mouth daily. 07/08/17  Yes Copland, Gay Filler, MD  diclofenac (VOLTAREN) 75 MG EC tablet Take 1 tablet (75 mg total) by mouth 2 (two) times daily. Use as needed for joint pains 07/19/17  Yes Copland, Gay Filler, MD  glipiZIDE (GLUCOTROL) 10 MG tablet Take 1 tablet (10 mg total) by mouth 2 (two) times daily before a meal. 07/08/17  Yes Copland, Gay Filler, MD  hydrochlorothiazide (HYDRODIURIL) 25 MG tablet Take 0.5 tablets (12.5 mg total) by mouth daily. 07/08/17  Yes Copland, Gay Filler, MD  Melatonin 3 MG TABS Take 1 tablet by mouth at bedtime.    Yes [provider]  Multiple Vitamin (MULTIVITAMIN) tablet Take 1 tablet by mouth daily.   Yes [provider]  Turmeric 450 MG CAPS Take 1 tablet by mouth daily.    Yes [provider]  Acetylcarnitine HCl 500 MG CAPS Take 500 mg by mouth daily.    [provider]  Alcohol Swabs (ALCOHOL PREP) PADS Use prior to insulin dose 08/22/17   Copland, Jessica C, MD  glucose blood test strip Use as instructed 06/28/16   Copland, Gay Filler, MD  Insulin Pen Needle (PEN NEEDLES) 31G X 5 MM MISC 1 each by Does not apply route daily. 08/22/17   Copland, Gay Filler, MD  liraglutide (VICTOZA) 18 MG/3ML SOPN Inject 1.8 mg [place] daily 07/08/17   Copland, Gay Filler, MD  metFORMIN (GLUCOPHAGE) 1000 MG tablet Take 1 tablet (1,000 mg total) by mouth 2 (two) times daily with a meal. 07/08/17   Copland, Gay Filler, MD  niacin  (NIASPAN) 500 MG CR tablet Take 1 tablet (500 mg total) by mouth at bedtime. Patient not taking: Reported on 10/02/2017 02/08/16   Copland, Gay Filler, MD  omeprazole (PRILOSEC) 40 MG capsule Take 1 capsule (40 mg total) by mouth daily. Patient taking differently: Take 40 mg by mouth daily as needed (heartburn).  07/08/17   Copland, Gay Filler, MD  tadalafil (CIALIS) 5 MG tablet Take 1 tablet (5 mg total) daily as needed by mouth for erectile dysfunction (and BPH). 07/24/17   Copland, Gay Filler, MD     Vital Signs: BP (!) 152/69   Pulse 85   Temp 99.7 F (37.6 C)   Resp (!) 32   Ht 5\' 9"  (1.753 m)   Wt 241 lb 2.9 oz (109.4 kg)   SpO2 98%   BMI 35.62 kg/m   Physical Exam  GB drain intact, output 140 cc ; site mildly tender to palp; drain flushed without difficulty; cx pend  Imaging: Ct Abdomen Pelvis Wo Contrast  Result Date: 10/04/2017 CLINICAL DATA:  Fever, chills, abdominal pain, nausea and vomiting. Elevated liver function studies. EXAM: CT ABDOMEN AND PELVIS WITHOUT CONTRAST TECHNIQUE: Multidetector CT imaging of the abdomen and pelvis was performed following the standard protocol without IV contrast. COMPARISON:  MRI abdomen 10/03/2017 FINDINGS: Lower chest: Small to moderate-sized bilateral pleural effusions with overlying atelectasis. Mild cardiac enlargement. Coronary artery calcifications. There is  an NG tube coursing down the esophagus and into the stomach. Hepatobiliary: The gallbladder is distended and contains gallstones. Surrounding inflammatory changes suspicious for acute cholecystitis. Mild common bile duct dilatation with maximum diameter of 8.5 mm. No obvious common bile duct stones. Pancreas: No mass, inflammation or ductal dilatation. Duodenum diverticulum noted near the pancreatic head. Spleen: Normal size.  No focal lesions. Adrenals/Urinary Tract: The adrenal glands and kidneys are unremarkable. Right renal cyst is noted. The bladder contains a Foley catheter.  Stomach/Bowel: The stomach, duodenum, small bowel and colon are grossly normal without oral contrast. 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 There is an NG tube in the stomach. Vascular/Lymphatic: Scattered aortic calcifications but no aneurysm. Small scattered mesenteric and retroperitoneal lymph nodes but no mass or adenopathy. Reproductive: The prostate gland contains brachytherapy seeds. There is a Foley catheter in the bladder. Seminal vesicles appear normal. Other: Moderate free pelvic fluid and mild free abdominal fluid. This measures as simple fluid. No pelvic mass or adenopathy. No inguinal mass or hernia. Musculoskeletal: Advanced degenerative changes involving the spine. Severe disc disease at L2-3. No acute bony findings. IMPRESSION: 1. CT findings most suggestive of acute cholecystitis. Right upper quadrant inflammatory process could also be due to duodenitis or pancreatitis but I think both of these are much less likely. 2. Bilateral pleural effusions and overlying atelectasis. 3. Small amount of free abdominal fluid and moderate amount of free pelvic fluid. Electronically Signed   By: Marijo Sanes M.D.   On: 10/04/2017 12:41   Nm Hepatobiliary Liver Func  Result Date: 10/03/2017 CLINICAL DATA:  Clinical concern for cystic duct obstruction. EXAM: NUCLEAR MEDICINE HEPATOBILIARY IMAGING TECHNIQUE: Sequential images of the abdomen were obtained out to 60 minutes following intravenous administration of radiopharmaceutical. RADIOPHARMACEUTICALS:  5.5 millicurie mCi [id number]  Choletec IV COMPARISON:  Ultrasound exam from yesterday. MRI from earlier today. FINDINGS: The patient was unable to complete the exam and terminated the study after 25 minutes of imaging. During this 25 minutes, there is brisk uptake of radiotracer by the hepatic parenchyma with rapid normal clearance of blood pool activity. Common duct activity is visible by 15 minutes and gut activity is visible by 20 minutes. No gallbladder filling  observed during this initial 25 minutes of imaging. IMPRESSION: 1. Patent common bile duct. 2. Cystic duct patency cannot be assessed on this study as patient terminated the exam before it was complete. Electronically Signed   By: Misty Stanley M.D.   On: 10/03/2017 19:36   Mr Abdomen Mrcp Wo Contrast  Result Date: 10/03/2017 CLINICAL DATA:  Jaundice with abdominal pain. Gallstones noted on recent ultrasound exam. EXAM: MRI ABDOMEN WITHOUT CONTRAST  (INCLUDING MRCP) TECHNIQUE: Multiplanar multisequence MR imaging of the abdomen was performed. Heavily T2-weighted images of the biliary and pancreatic ducts were obtained, and three-dimensional MRCP images were rendered by post processing. COMPARISON:  Ultrasound exam 10/02/2017 FINDINGS: Study is markedly motion degraded due to patient inability to reproducibly breath hold. Lower chest: Heart size upper normal. Small bilateral pleural effusions. Hepatobiliary: Liver measures 24.3 cm craniocaudal length, enlarged. Diffuse fatty deposition noted within the liver parenchyma. Tiny stones are seen dependently in the lumen of the gallbladder (images 30 and 31 of series 13). There is some debris or possible stones in the fundus of the gallbladder. Trace peri cholecystic fluid evident. No substantial intrahepatic biliary duct dilatation. The common duct and common bile ducts are not well evaluated due to the substantial motion artifact. Extrahepatic common duct measures 7 mm diameter where visualized on  axial single shot T2 weighted imaging. Common bile duct in the head of the pancreas is 7 mm diameter although incompletely visualized. There is some central flow artifact within the common bile duct on the axial T2 imaging. Pancreas: No dilatation of the main duct. 17 x 11 x 20 mm cystic lesion identified in the posterior pancreatic head. Susceptibility artifact in the head of the pancreas on multiple sequences may be related to gas and duodenal diverticuli or parenchymal  calcification in the pancreatic head. Spleen:  No splenomegaly. No focal mass lesion. Adrenals/Urinary Tract: No adrenal nodule or mass. 4.1 cm simple appearing cyst identified posterior right kidney. No overtly suspicious lesion identified in either kidney on this noncontrast exam. Stomach/Bowel: Stomach is nondistended. Duodenum is normally positioned as is the ligament of Treitz. No small bowel or colonic dilatation within the visualized abdomen. Vascular/Lymphatic: No abdominal aortic aneurysm. Signal void within the portal vein suggests patency. No abdominal lymphadenopathy. Other:  Trace fluid is noted at the inferior liver. Musculoskeletal: No abnormal marrow signal within the visualized bony anatomy. IMPRESSION: 1. Markedly motion degraded study due to patient inability to reproducibly breath hold. Common duct and common bile duct are incompletely evaluated. Where visualized, the extrahepatic biliary tree measures up to 7 mm diameter. Portions of the common bile duct entering and tracking through the head of pancreas are not visualized. 2. Tiny gallstones noted in the dependent gallbladder lumen. There is trace pericholecystic fluid. Debris is noted in the gallbladder fundus. 3. No dilatation of the main pancreatic duct with a 7 x 11 x 20 mm cystic lesion in the posterior pancreatic head showing no definite communication to the main duct but motion artifact limits assessment. Repeat MRI in 6 months recommended to re-evaluate. This recommendation follows ACR consensus guidelines: Management of Incidental Pancreatic Cysts: A White Paper of the ACR Incidental Findings Committee. [place] 0240;97:[id number]. 4. Hepatic steatosis with hepatomegaly. 5. Small bilateral pleural effusions. Electronically Signed   By: Misty Stanley M.D.   On: 10/03/2017 16:04   Dg Chest Port 1 View  Result Date: 10/05/2017 CLINICAL DATA:  Hypoxia EXAM: PORTABLE CHEST 1 VIEW COMPARISON:  October 04, 2017 FINDINGS: Endotracheal  tube tip is 3.2 cm above the carina. Nasogastric tube tip and side port are below the diaphragm. No pneumothorax. There is atelectatic change in the left base with small left pleural effusion. Lungs elsewhere are clear. Heart is mildly enlarged with pulmonary vascularity within normal limits. No adenopathy. No bone lesions. IMPRESSION: Tube positions as described without pneumothorax. Left base atelectasis with small left pleural effusion. Lungs elsewhere clear. Stable cardiac silhouette. Electronically Signed   By: Lowella Grip III M.D.   On: 10/05/2017 07:15   Dg Chest Port 1 View  Result Date: 10/04/2017 CLINICAL DATA:  Endotracheal and orogastric tube placement. EXAM: PORTABLE CHEST 1 VIEW COMPARISON:  Radiograph October 02, 2017. FINDINGS: Stable cardiomediastinal silhouette. Endotracheal tube is seen projected over tracheal air shadow with distal tip 3 cm above the carina. Orogastric tube is seen passing through esophagus into stomach. Distal tip is not clearly visualized. Mild central pulmonary vascular congestion is noted. Mild bibasilar subsegmental atelectasis is noted. No pneumothorax is noted. Bony thorax is unremarkable. IMPRESSION: Endotracheal and orogastric tubes are in grossly good position. Mild central pulmonary vascular congestion is noted. Mild bibasilar subsegmental atelectasis is noted. Electronically Signed   By: Marijo Conception, M.D.   On: 10/04/2017 09:55   Dg Abd Portable 1v  Result Date: 10/07/2017 CLINICAL  DATA:  Vomiting.  History of cholecystostomy tube. EXAM: PORTABLE ABDOMEN - 1 VIEW COMPARISON:  CT abdomen pelvis-10/04/2017; ultrasound and CT guided cholecystostomy tube placement-10/04/2017 FINDINGS: There is moderate to marked gaseous distention of multiple loops of small bowel with index loop of small bowel within the left mid hemiabdomen measuring approximately 5.2 cm in diameter. This finding is associated with a conspicuous paucity of distal colonic gas.  Nondiagnostic evaluation for pneumoperitoneum secondary to supine positioning and exclusion of the lower thorax. No definite pneumatosis or portal venous gas. Cholecystostomy tube overlies expected location of the gallbladder fossa. A catheter overlies the expected location of the urinary bladder. 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 Several phleboliths overlie the lower pelvis bilaterally. IMPRESSION: Findings worrisome for developing small bowel obstruction. Clinical correlation and continued attention on follow-up is advised. Electronically Signed   By: Sandi Mariscal M.D.   On: 10/07/2017 10:10   Dg Abd Portable 1v  Result Date: 10/04/2017 CLINICAL DATA:  Orogastric tube placement. EXAM: PORTABLE ABDOMEN - 1 VIEW COMPARISON:  Radiographs of October 02, 2017. FINDINGS: No definite abnormal bowel dilatation is seen currently. Air-filled colon is noted. Distal tip of orogastric tube is seen in proximal stomach. IMPRESSION: Distal tip of orogastric tube seen in proximal stomach. Electronically Signed   By: Marijo Conception, M.D.   On: 10/04/2017 09:56   Ct Perc Cholecystostomy  Result Date: 10/04/2017 INDICATION: 72 year old with abdominal pain, sepsis and confusion. Imaging studies and physical examination are suggestive for acute cholecystitis. EXAM: IMAGE GUIDED PERCUTANEOUS CHOLECYSTOSTOMY TUBE PLACEMENT MEDICATIONS: Scheduled IV Zosyn 3.375 gm was given immediately prior to the procedure. ANESTHESIA/SEDATION: Patient was intubated and sedated prior to arriving in the Radiology department. Patient was monitored by radiology nurse throughout the procedure. FLUOROSCOPY TIME:  None COMPLICATIONS: None immediate. PROCEDURE: Informed written consent was obtained from the patient's daughter after a thorough discussion of the procedural risks, benefits and alternatives. All questions were  addressed. Maximal Sterile Barrier Technique was utilized including caps, mask, sterile gowns, sterile gloves, sterile drape, hand hygiene and skin antiseptic. A timeout was performed prior to the initiation of the procedure. Patient was supine on the CT scanner. The gallbladder was identified with CT and ultrasound. The anterior abdomen was prepped and draped in sterile fashion. Skin was anesthetized with 1% lidocaine. Using ultrasound guidance, an 18 gauge trocar needle was directed into the dilated gallbladder. A stiff Amplatz wire was advanced into the gallbladder with ultrasound guidance. The tract was dilated to accommodate a 10.2 [place] multipurpose drain. 90 mL of cloudy dark brown fluid was removed. Catheter was sutured to skin and placed to gravity bag. Follow up CT images confirm placement in the gallbladder. Ultrasound and CT images were saved for documentation. FINDINGS: Mild distention of the gallbladder with mild gallbladder wall thickening. Small amount of perihepatic ascites. Gallbladder drain was successfully placed within the gallbladder. 90 mL of cloudy brown fluid was removed and sent for culture. IMPRESSION: Successful image guided percutaneous cholecystostomy tube placement. Electronically Signed   By: Markus Daft M.D.   On: 10/04/2017 14:44    Labs:  CBC: Recent Labs    10/04/17 0556 10/05/17 0300 10/06/17 0719 10/07/17 0336  WBC 10.8* 7.5 8.0 11.9*  HGB 10.3* 10.2* 8.9* 9.8*  HCT 31.0* 31.0* 26.4* 29.4*  PLT 226 167 208 297    COAGS: Recent Labs    10/04/17 0412  INR 1.04  BMP: Recent Labs    10/04/17 2149 10/05/17 0300 10/06/17 0216 10/07/17 0336  NA 139 136 141 139  K 3.7 3.9 3.7 3.4*  CL 115* 112* 115* 113*  CO2 16* 16* 19* 18*  GLUCOSE 145* 144* 130* 141*  BUN 40* 40* 37* 33*  CALCIUM 8.1* 7.7* 8.1* 8.4*  CREATININE 1.65* 1.67* 1.93* 1.83*  1.77*  GFRNONAA 41* 40* 34* 36*  37*  GFRAA 47* 47* 39* 42*  43*    LIVER FUNCTION TESTS: Recent  Labs    10/03/17 0443 10/04/17 0412 10/05/17 0300 10/06/17 0216  BILITOT 1.3* 1.1 3.4* 3.6*  AST 43* 53* 103* 83*  ALT 156* 115* 176* 144*  ALKPHOS 56 53 143* 123  PROT 6.6 7.0 6.2* 6.1*  ALBUMIN 3.2* 3.4* 2.6* 2.3*    Assessment and Plan: Cholecystitis, s/p perc cholecystostomy 1/18; temp 99.7; WBC 11.9, hgb 9.8, creat 1.77(1.93), bile cx pend; abd film today- worrisome for developing SBO- consider NG tube if persistent; GB drain will need to remain in place at least 4-6 weeks unless cholecystectomy done in interim; additional plans as per surgery/CCM.   Electronically Signed: D. Rowe Robert, PA-C 10/07/2017, 11:05 AM   I spent a total of 15 minutes at the the patient's bedside AND on the patient's hospital floor or unit, greater than 50% of which was counseling/coordinating care for gallbladder drain    Patient ID: SANDFORD DIOP, male   DOB: 16-Feb-1946, 72 y.o.   MRN: [id number]

## 2017-10-07 NOTE — Care Management Note (Signed)
Case Management Note  Patient Details  Name: Timothy Chapman MRN: [id number] Date of Birth: [birth year]  Subjective/Objective:                  Sepsis, with aki, n&v  Action/Plan: Date:  October 07, 2017 Chart reviewed for concurrent status and case management needs.  Will continue to follow patient progress.  Discharge Planning: following for needs.  None present at this time of review. Expected discharge date: October 10, 2017 Velva Harman, BSN, [place], [place]   Expected Discharge Date:  (unknown)               Expected Discharge Plan:  Home/Self Care  In-House Referral:     Discharge planning Services  CM Consult  Post Acute Care Choice:    Choice offered to:     DME Arranged:    DME Agency:     HH Arranged:    HH Agency:     Status of Service:  In process, will continue to follow  If discussed at Long Length of Stay Meetings, dates discussed:    Additional Comments:  Leeroy Cha, RN 10/07/2017, 10:00 AM

## 2017-10-08 ENCOUNTER — Inpatient Hospital Stay (HOSPITAL_COMMUNITY): Payer: Medicare HMO

## 2017-10-08 DIAGNOSIS — K8309 Other cholangitis: Secondary | ICD-10-CM

## 2017-10-08 DIAGNOSIS — N170 Acute kidney failure with tubular necrosis: Secondary | ICD-10-CM

## 2017-10-08 DIAGNOSIS — K56609 Unspecified intestinal obstruction, unspecified as to partial versus complete obstruction: Secondary | ICD-10-CM

## 2017-10-08 DIAGNOSIS — K810 Acute cholecystitis: Secondary | ICD-10-CM

## 2017-10-08 DIAGNOSIS — K567 Ileus, unspecified: Secondary | ICD-10-CM

## 2017-10-08 HISTORY — DX: Acute kidney failure with tubular necrosis: N17.0

## 2017-10-08 HISTORY — DX: Unspecified intestinal obstruction, unspecified as to partial versus complete obstruction: K56.609

## 2017-10-08 LAB — CBC
HEMATOCRIT: 34.6 % — AB (ref 39.0–52.0)
HEMOGLOBIN: 11.4 g/dL — AB (ref 13.0–17.0)
MCH: 28.3 pg (ref 26.0–34.0)
MCHC: 32.9 g/dL (ref 30.0–36.0)
MCV: 85.9 fL (ref 78.0–100.0)
Platelets: 385 10*3/uL (ref 150–400)
RBC: 4.03 MIL/uL — AB (ref 4.22–5.81)
RDW: 14.6 % (ref 11.5–15.5)
WBC: 11.2 10*3/uL — AB (ref 4.0–10.5)

## 2017-10-08 LAB — GLUCOSE, CAPILLARY
GLUCOSE-CAPILLARY: 157 mg/dL — AB (ref 65–99)
GLUCOSE-CAPILLARY: 143 mg/dL — AB (ref 65–99)
Glucose-Capillary: 140 mg/dL — ABNORMAL HIGH (ref 65–99)
Glucose-Capillary: 159 mg/dL — ABNORMAL HIGH (ref 65–99)
Glucose-Capillary: 131 mg/dL — ABNORMAL HIGH (ref 65–99)
Glucose-Capillary: 133 mg/dL — ABNORMAL HIGH (ref 65–99)

## 2017-10-08 LAB — BASIC METABOLIC PANEL
Anion gap: 6 (ref 5–15)
BUN: 40 mg/dL — ABNORMAL HIGH (ref 6–20)
CO2: 20 mmol/L — AB (ref 22–32)
Calcium: 8.5 mg/dL — ABNORMAL LOW (ref 8.9–10.3)
Chloride: 115 mmol/L — ABNORMAL HIGH (ref 101–111)
Creatinine, Ser: 1.88 mg/dL — ABNORMAL HIGH (ref 0.61–1.24)
GFR, EST AFRICAN AMERICAN: 40 mL/min — AB (ref >60)
GFR, EST NON AFRICAN AMERICAN: 35 mL/min — AB (ref >60)
GLUCOSE: 168 mg/dL — AB (ref 65–99)
POTASSIUM: 3.4 mmol/L — AB (ref 3.5–5.1)
Sodium: 141 mmol/L (ref 135–145)

## 2017-10-08 LAB — PROCALCITONIN: PROCALCITONIN: 4.53 ng/mL

## 2017-10-08 LAB — CULTURE, BLOOD (ROUTINE X 2)
CULTURE: NO GROWTH
Culture: NO GROWTH
SPECIAL REQUESTS: ADEQUATE
SPECIAL REQUESTS: ADEQUATE

## 2017-10-08 MED ORDER — AMLODIPINE BESYLATE 5 MG PO TABS
5.0000 mg | ORAL_TABLET | Freq: Every day | ORAL | Status: DC
  Filled 2017-10-08: qty 1

## 2017-10-08 MED ORDER — TRAMADOL HCL 50 MG PO TABS
50.0000 mg | ORAL_TABLET | Freq: Four times a day (QID) | ORAL | Status: DC | PRN
  Administered 2017-10-08 – 2017-10-10 (×4): 100 mg via ORAL
  Filled 2017-10-08 (×4): qty 2

## 2017-10-08 MED ORDER — PHENOL 1.4 % MT LIQD
1.0000 | OROMUCOSAL | Status: DC | PRN
  Administered 2017-10-08: 1 via OROMUCOSAL
  Filled 2017-10-08: qty 177

## 2017-10-08 MED ORDER — POTASSIUM CHLORIDE IN NACL 20-0.45 MEQ/L-% IV SOLN
INTRAVENOUS | Status: DC
  Administered 2017-10-08: 09:00:00 via INTRAVENOUS
  Filled 2017-10-08: qty 1000

## 2017-10-08 MED ORDER — POTASSIUM CHLORIDE CRYS ER 20 MEQ PO TBCR
20.0000 meq | EXTENDED_RELEASE_TABLET | Freq: Two times a day (BID) | ORAL | Status: AC
  Administered 2017-10-08 (×2): 20 meq via ORAL
  Filled 2017-10-08 (×2): qty 1

## 2017-10-08 NOTE — Progress Notes (Signed)
Nutrition Follow-up  DOCUMENTATION CODES:   Obesity unspecified  INTERVENTION:  - Diet advancement as medically feasible. - Will provide interventions at follow-up.   NUTRITION DIAGNOSIS:   Inadequate oral intake related to inability to eat as evidenced by NPO status. -ongoing  GOAL:   Patient will meet greater than or equal to 90% of their needs -unmet/unable to meet at this time.   MONITOR:   Diet advancement, Weight trends, Labs, I & O's  ASSESSMENT:   72 year old obese gentleman with a history of hypertension, diabetes, obstructive sleep apnea, GERD.  Admitted on 1/16 with abdominal pain, nausea with vomiting, fever and chills for proximally 3 days.  His evaluation revealed a transaminitis, acute renal failure.  Initial x-rays suggested a small bowel obstruction and he was kept n.p.o.  Concern for either a viral hepatitis or cholangitis.  Unfortunately his course been complicated by some respiratory distress and agitation, delirium likely due to his evolving underlying illness.    Pt extubated on 1/20 around 10:20 AM. Estimated nutrition needs updated based on this event. Weight mainly stable since admission. NGT placed yesterday around 3:00 PM and to LIS d/t N/V. Pt's nausea has been mainly well controlled with some slight nausea since NGT placement. He is interested in having something to eat.   Surgery PA note from this AM, perc chole drain placed on 10/04/17 and that KUB from this AM is still showing dilated loops of small bowel. Note states 2.1L output from NGT yesterday; plan for clamping trials today and to allow sips of clears. If this goes well, may be able to removed NGT later today versus tomorrow AM.   Medications reviewed; 20 mg IV Pepcid BID, sliding scale Novolog, 1 tablet Senokot BID. Labs reviewed; CBG: 159 mmol/L, K: 3.4 mmol?L, Cl: 115 mmol/L, BUN: 40 mg/dL, creatinine: 1.88 mg/dL, Ca: 8.5 mg/dL, GFR: 35 mL/min.   IVF: NS-20 mEq KCl @ 75 mL/hr.      Diet  Order:  No diet orders on file  EDUCATION NEEDS:   No education needs have been identified at this time  Skin:  Skin Assessment: Reviewed RN Assessment  Last BM:  1/21  Height:   Ht Readings from Last 1 Encounters:  10/04/17 5\' 9"  (1.753 m)    Weight:   Wt Readings from Last 1 Encounters:  10/08/17 243 lb 6.2 oz (110.4 kg)    Ideal Body Weight:  72.73 kg  BMI:  Body mass index is 35.94 kg/m.  Estimated Nutritional Needs:   Kcal:  2100-2320  Protein:  130-140 grams  Fluid:  >/= 2 L/day      Jarome Matin, MS, RD, LDN, [facility] Inpatient Clinical Dietitian Pager # [phone] After hours/weekend pager # [phone]

## 2017-10-08 NOTE — Progress Notes (Signed)
Referring Physician(s): Ingram,H  Supervising Physician: Sandi Mariscal  Patient Status:  [facility] - In-pt  Chief Complaint:  cholecystitis  Subjective: Pt states he feels better today; more talkative; denies N/V; NG in place; has had liquid stool/passing flatus   Allergies: Codeine  Medications: Prior to Admission medications   Medication Sig Start Date End Date Taking? Authorizing Provider  Alpha-Lipoic Acid 600 MG CAPS Take 600 mg by mouth daily.   Yes [provider]  benazepril (LOTENSIN) 20 MG tablet Take 1 tablet (20 mg total) by mouth daily. 07/08/17  Yes Copland, Gay Filler, MD  diclofenac (VOLTAREN) 75 MG EC tablet Take 1 tablet (75 mg total) by mouth 2 (two) times daily. Use as needed for joint pains 07/19/17  Yes Copland, Gay Filler, MD  glipiZIDE (GLUCOTROL) 10 MG tablet Take 1 tablet (10 mg total) by mouth 2 (two) times daily before a meal. 07/08/17  Yes Copland, Gay Filler, MD  hydrochlorothiazide (HYDRODIURIL) 25 MG tablet Take 0.5 tablets (12.5 mg total) by mouth daily. 07/08/17  Yes Copland, Gay Filler, MD  Melatonin 3 MG TABS Take 1 tablet by mouth at bedtime.    Yes [provider]  Multiple Vitamin (MULTIVITAMIN) tablet Take 1 tablet by mouth daily.   Yes [provider]  Turmeric 450 MG CAPS Take 1 tablet by mouth daily.    Yes [provider]  Acetylcarnitine HCl 500 MG CAPS Take 500 mg by mouth daily.    [provider]  Alcohol Swabs (ALCOHOL PREP) PADS Use prior to insulin dose 08/22/17   Copland, Jessica C, MD  glucose blood test strip Use as instructed 06/28/16   Copland, Gay Filler, MD  Insulin Pen Needle (PEN NEEDLES) 31G X 5 MM MISC 1 each by Does not apply route daily. 08/22/17   Copland, Gay Filler, MD  liraglutide (VICTOZA) 18 MG/3ML SOPN Inject 1.8 mg [place] daily 07/08/17   Copland, Gay Filler, MD  metFORMIN (GLUCOPHAGE) 1000 MG tablet Take 1 tablet (1,000 mg total) by mouth 2 (two) times daily with a meal. 07/08/17    Copland, Gay Filler, MD  niacin (NIASPAN) 500 MG CR tablet Take 1 tablet (500 mg total) by mouth at bedtime. Patient not taking: Reported on 10/02/2017 02/08/16   Copland, Gay Filler, MD  omeprazole (PRILOSEC) 40 MG capsule Take 1 capsule (40 mg total) by mouth daily. Patient taking differently: Take 40 mg by mouth daily as needed (heartburn).  07/08/17   Copland, Gay Filler, MD  tadalafil (CIALIS) 5 MG tablet Take 1 tablet (5 mg total) daily as needed by mouth for erectile dysfunction (and BPH). 07/24/17   Copland, Gay Filler, MD     Vital Signs: BP (!) 167/65   Pulse 77   Temp 99.3 F (37.4 C)   Resp (!) 26   Ht 5\' 9"  (1.753 m)   Wt 243 lb 6.2 oz (110.4 kg)   SpO2 98%   BMI 35.94 kg/m   Physical Exam perc GB drain intact, insertion site ok; output 30 cc light yellow fluid; cx- mult organisms; drain flushed without difficulty; abd remains distended  Imaging: Ct Abdomen Pelvis Wo Contrast  Result Date: 10/04/2017 CLINICAL DATA:  Fever, chills, abdominal pain, nausea and vomiting. Elevated liver function studies. EXAM: CT ABDOMEN AND PELVIS WITHOUT CONTRAST TECHNIQUE: Multidetector CT imaging of the abdomen and pelvis was performed following the standard protocol without IV contrast. COMPARISON:  MRI abdomen 10/03/2017 FINDINGS: Lower chest: Small to moderate-sized bilateral pleural effusions with overlying  atelectasis. Mild cardiac enlargement. Coronary artery calcifications. There is an NG tube coursing down the esophagus and into the stomach. Hepatobiliary: The gallbladder is distended and contains gallstones. Surrounding inflammatory changes suspicious for acute cholecystitis. Mild common bile duct dilatation with maximum diameter of 8.5 mm. No obvious common bile duct stones. Pancreas: No mass, inflammation or ductal dilatation. Duodenum diverticulum noted near the pancreatic head. Spleen: Normal size.  No focal lesions. Adrenals/Urinary Tract: The adrenal glands and kidneys are unremarkable.  Right renal cyst is noted. The bladder contains a Foley catheter. Stomach/Bowel: The stomach, duodenum, small bowel and colon are grossly normal without oral contrast. 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 There is an NG tube in the stomach. Vascular/Lymphatic: Scattered aortic calcifications but no aneurysm. Small scattered mesenteric and retroperitoneal lymph nodes but no mass or adenopathy. Reproductive: The prostate gland contains brachytherapy seeds. There is a Foley catheter in the bladder. Seminal vesicles appear normal. Other: Moderate free pelvic fluid and mild free abdominal fluid. This measures as simple fluid. No pelvic mass or adenopathy. No inguinal mass or hernia. Musculoskeletal: Advanced degenerative changes involving the spine. Severe disc disease at L2-3. No acute bony findings. IMPRESSION: 1. CT findings most suggestive of acute cholecystitis. Right upper quadrant inflammatory process could also be due to duodenitis or pancreatitis but I think both of these are much less likely. 2. Bilateral pleural effusions and overlying atelectasis. 3. Small amount of free abdominal fluid and moderate amount of free pelvic fluid. Electronically Signed   By: Marijo Sanes M.D.   On: 10/04/2017 12:41   Dg Chest Port 1 View  Result Date: 10/08/2017 CLINICAL DATA:  Respiratory failure. EXAM: PORTABLE CHEST 1 VIEW COMPARISON:  10/05/2017 FINDINGS: Enteric tube has been pulled back as tip is just below the expected region of the gastroesophageal junction and side-port over the distal esophagus. This could be advanced approximately 10 cm. Interval removal of endotracheal tube. Lungs are somewhat hypoinflated with minimal linear atelectasis/scarring over the left base. Cardiomediastinal silhouette and remainder the exam is unchanged. IMPRESSION: Subtle linear atelectasis/scarring left base. Enteric tube has been pulled pulled back as tip is now just below the expected region of the gastroesophageal junction as this  could be advanced approximately 10 cm. These results will be called to the ordering clinician or representative by the Radiologist Assistant, and communication documented in the PACS or zVision Dashboard. Electronically Signed   By: Marin Olp M.D.   On: 10/08/2017 07:19   Dg Chest Port 1 View  Result Date: 10/05/2017 CLINICAL DATA:  Hypoxia EXAM: PORTABLE CHEST 1 VIEW COMPARISON:  October 04, 2017 FINDINGS: Endotracheal tube tip is 3.2 cm above the carina. Nasogastric tube tip and side port are below the diaphragm. No pneumothorax. There is atelectatic change in the left base with small left pleural effusion. Lungs elsewhere are clear. Heart is mildly enlarged with pulmonary vascularity within normal limits. No adenopathy. No bone lesions. IMPRESSION: Tube positions as described without pneumothorax. Left base atelectasis with small left pleural effusion. Lungs elsewhere clear. Stable cardiac silhouette. Electronically Signed   By: Lowella Grip III M.D.   On: 10/05/2017 07:15   Dg Abd Portable 1v  Result Date: 10/08/2017 CLINICAL DATA:  Ileus. EXAM: PORTABLE ABDOMEN - 1 VIEW COMPARISON:  10/07/2017 FINDINGS: Percutaneous cholecystostomy tube unchanged. Continued evidence of several air-filled dilated small bowel loops in the central abdomen slightly decreased and caliber and number as the most dilated loop measures 4.8 cm. Air is present throughout the colon. No free peritoneal air  remainder the exam is unchanged. IMPRESSION: Findings compatible with known ileus with mild interval improvement. Cholecystostomy catheter unchanged. Electronically Signed   By: Marin Olp M.D.   On: 10/08/2017 07:21   Dg Abd Portable 1v  Result Date: 10/07/2017 CLINICAL DATA:  Vomiting.  History of cholecystostomy tube. EXAM: PORTABLE ABDOMEN - 1 VIEW COMPARISON:  CT abdomen pelvis-10/04/2017; ultrasound and CT guided cholecystostomy tube placement-10/04/2017 FINDINGS: There is moderate to marked gaseous  distention of multiple loops of small bowel with index loop of small bowel within the left mid hemiabdomen measuring approximately 5.2 cm in diameter. This finding is associated with a conspicuous paucity of distal colonic gas. Nondiagnostic evaluation for pneumoperitoneum secondary to supine positioning and exclusion of the lower thorax. No definite pneumatosis or portal venous gas. Cholecystostomy tube overlies expected location of the gallbladder fossa. A catheter overlies the expected location of the urinary bladder. 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 Several phleboliths overlie the lower pelvis bilaterally. IMPRESSION: Findings worrisome for developing small bowel obstruction. Clinical correlation and continued attention on follow-up is advised. Electronically Signed   By: Sandi Mariscal M.D.   On: 10/07/2017 10:10   Ct Perc Cholecystostomy  Result Date: 10/04/2017 INDICATION: 72 year old with abdominal pain, sepsis and confusion. Imaging studies and physical examination are suggestive for acute cholecystitis. EXAM: IMAGE GUIDED PERCUTANEOUS CHOLECYSTOSTOMY TUBE PLACEMENT MEDICATIONS: Scheduled IV Zosyn 3.375 gm was given immediately prior to the procedure. ANESTHESIA/SEDATION: Patient was intubated and sedated prior to arriving in the Radiology department. Patient was monitored by radiology nurse throughout the procedure. FLUOROSCOPY TIME:  None COMPLICATIONS: None immediate. PROCEDURE: Informed written consent was obtained from the patient's daughter after a thorough discussion of the procedural risks, benefits and alternatives. All questions were addressed. Maximal Sterile Barrier Technique was utilized including caps, mask, sterile gowns, sterile gloves, sterile drape, hand hygiene and skin antiseptic. A timeout was performed prior to the initiation of the procedure. Patient was supine  on the CT scanner. The gallbladder was identified with CT and ultrasound. The anterior abdomen was prepped and draped in sterile fashion. Skin was anesthetized with 1% lidocaine. Using ultrasound guidance, an 18 gauge trocar needle was directed into the dilated gallbladder. A stiff Amplatz wire was advanced into the gallbladder with ultrasound guidance. The tract was dilated to accommodate a 10.2 [place] multipurpose drain. 90 mL of cloudy dark brown fluid was removed. Catheter was sutured to skin and placed to gravity bag. Follow up CT images confirm placement in the gallbladder. Ultrasound and CT images were saved for documentation. FINDINGS: Mild distention of the gallbladder with mild gallbladder wall thickening. Small amount of perihepatic ascites. Gallbladder drain was successfully placed within the gallbladder. 90 mL of cloudy brown fluid was removed and sent for culture. IMPRESSION: Successful image guided percutaneous cholecystostomy tube placement. Electronically Signed   By: Markus Daft M.D.   On: 10/04/2017 14:44    Labs:  CBC: Recent Labs    10/05/17 0300 10/06/17 0719 10/07/17 0336 10/08/17 0257  WBC 7.5 8.0 11.9* 11.2*  HGB 10.2* 8.9* 9.8* 11.4*  HCT 31.0* 26.4* 29.4* 34.6*  PLT 167 208 297 385    COAGS: Recent Labs    10/04/17 0412  INR 1.04    BMP: Recent Labs    10/06/17 0216 10/07/17 0336 10/07/17 1304 10/08/17 0257  NA 141 139 140 141  K 3.7 3.4* 3.9 3.4*  CL 115* 113* 114* 115*  CO2 19* 18* 19* 20*  GLUCOSE 130* 141* 215* 168*  BUN 37* 33* 36* 40*  CALCIUM 8.1* 8.4* 8.4* 8.5*  CREATININE 1.93* 1.83*  1.77* 1.85* 1.88*  GFRNONAA 34* 36*  37* 36* 35*  GFRAA 39* 42*  43* 41* 40*    LIVER FUNCTION TESTS: Recent Labs    10/04/17 0412 10/05/17 0300 10/06/17 0216 10/07/17 1304  BILITOT 1.1 3.4* 3.6* 2.0*  AST 53* 103* 83* 30  ALT 115* 176* 144* 75*  ALKPHOS 53 143* 123 96  PROT 7.0 6.2* 6.1* 6.3*  ALBUMIN 3.4* 2.6* 2.3* 2.2*    Assessment and  Plan: Cholecystitis, s/p perc cholecystostomy 1/18; temp 99.3, WBC 11.2(11.9), hgb 11.4, creat 1.88(1.85), bile cx- mult org; blood cx neg to date; abd film with sl improvement in ileus; cont current tx; drain will need to remain in place at least 4-6 weeks unless cholecystectomy done in interim; if drain output declines or LFT's rise perform f/u cholangiogram; OOB     Electronically Signed: D. Rowe Robert, PA-C 10/08/2017, 9:49 AM   I spent a total of 15 minutes at the the patient's bedside AND on the patient's hospital floor or unit, greater than 50% of which was counseling/coordinating care for gallbladder drain    Patient ID: Timothy Chapman, male   DOB: 1945/12/21, 72 y.o.   MRN: [id number]

## 2017-10-08 NOTE — Progress Notes (Signed)
[facility] PROGRESS NOTE    Progress Note  Timothy Chapman  MWU:[id number] DOB: 27-May-1946 DOA: 10/02/2017 PCP: [name], MD     Brief Narrative:   Timothy Chapman is an 72 y.o. male past medical history of hypertension diabetes came into the hospital complaining of abdominal pain and fever, with elevated LFTs diagnosed with cholangitis, admitted to the ICU intubated status post percutaneous drain on 10/04/2017  Assessment/Plan:   Acute respiratory failure with hypoxia [facility]): Due to his respiratory distress and delirium. Intubated on 10/04/2016 extubated on 10/06/2017, satting 98% on room air.  Acute cholecystitis/cholangitis/sepsis shock s/p perc cholecystomy drainage 10/04/2017 Surgery was consulted who recommended percutaneous drain performed by interventional radiology on 10/05/2017, LFTs are trending down Continue empiric IV antibiotics since started on 10/02/2016. Has remained afebrile. Continue H2 blockers. Check a KUB surgery and board awaiting surgically further recommendations.  HYPERTENSION, BENIGN ESSENTIAL, UNCONTROLLED High, likely due to abdominal pain, continue metoprolol and hydralazine as needed. Will transition to oral once able to tolerate oral meds  AKI (acute kidney injury) ([place]) Baseline creatinine around 1.0 Probably ATN, creatinine has stabilized with IV fluids. Check a renal ultrasound  Ileus: Likely due to sepsis in the setting of cholangitis, surgery on board. Has NG tube to intermittent suction. Had 2 bowel movements yesterday and was passing gas. Lites he is hungry he is tolerating ice chips, will clamp NG tube  Diabetes mellitus type II: Continue sliding scale insulin.  Hypokalemia: Change fluids to half-normal with potassium check a basic metabolic panel in the morning.   DVT prophylaxis: lovenox Family Communication:daughter Disposition Plan/Barrier to D/C: unable to determine Code Status:     Code Status Orders    (From admission, onward)        Start     Ordered   10/02/17 1857  Full code  Continuous     10/02/17 1856    Code Status History    Date Active Date Inactive Code Status Order ID Comments User Context   This patient has a current code status but no historical code status.        IV Access:    Peripheral IV   Procedures and diagnostic studies:   Dg Chest Port 1 View  Result Date: 10/08/2017 CLINICAL DATA:  Respiratory failure. EXAM: PORTABLE CHEST 1 VIEW COMPARISON:  10/05/2017 FINDINGS: Enteric tube has been pulled back as tip is just below the expected region of the gastroesophageal junction and side-port over the distal esophagus. This could be advanced approximately 10 cm. Interval removal of endotracheal tube. Lungs are somewhat hypoinflated with minimal linear atelectasis/scarring over the left base. Cardiomediastinal silhouette and remainder the exam is unchanged. IMPRESSION: Subtle linear atelectasis/scarring left base. Enteric tube has been pulled pulled back as tip is now just below the expected region of the gastroesophageal junction as this could be advanced approximately 10 cm. These results will be called to the ordering clinician or representative by the Radiologist Assistant, and communication documented in the PACS or zVision Dashboard. Electronically Signed   By: Marin Olp M.D.   On: 10/08/2017 07:19   Dg Abd Portable 1v  Result Date: 10/08/2017 CLINICAL DATA:  Ileus. EXAM: PORTABLE ABDOMEN - 1 VIEW COMPARISON:  10/07/2017 FINDINGS: Percutaneous cholecystostomy tube unchanged. Continued evidence of several air-filled dilated small bowel loops in the central abdomen slightly decreased and caliber and number as the most dilated loop measures 4.8 cm. Air is present throughout the colon. No free peritoneal air remainder the exam is  unchanged. IMPRESSION: Findings compatible with known ileus with mild interval improvement. Cholecystostomy catheter unchanged.  Electronically Signed   By: Marin Olp M.D.   On: 10/08/2017 07:21   Dg Abd Portable 1v  Result Date: 10/07/2017 CLINICAL DATA:  Vomiting.  History of cholecystostomy tube. EXAM: PORTABLE ABDOMEN - 1 VIEW COMPARISON:  CT abdomen pelvis-10/04/2017; ultrasound and CT guided cholecystostomy tube placement-10/04/2017 FINDINGS: There is moderate to marked gaseous distention of multiple loops of small bowel with index loop of small bowel within the left mid hemiabdomen measuring approximately 5.2 cm in diameter. This finding is associated with a conspicuous paucity of distal colonic gas. Nondiagnostic evaluation for pneumoperitoneum secondary to supine positioning and exclusion of the lower thorax. No definite pneumatosis or portal venous gas. Cholecystostomy tube overlies expected location of the gallbladder fossa. A catheter overlies the expected location of the urinary bladder. 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 Several phleboliths overlie the lower pelvis bilaterally. IMPRESSION: Findings worrisome for developing small bowel obstruction. Clinical correlation and continued attention on follow-up is advised. Electronically Signed   By: Sandi Mariscal M.D.   On: 10/07/2017 10:10     Medical Consultants:    None.  Anti-Infectives:   IV Zosyn.  Subjective:    Timothy Chapman relates he feels much better he relates he to bowel movements and is passing gas. He is hungry, is moving out of bed to chair and sometimes to the bedside commode with minimal pain.  Objective:    Vitals:   10/08/17 0300 10/08/17 0400 10/08/17 0500 10/08/17 0600  BP: (!) 171/78 (!) 172/70 (!) 159/69 (!) 167/65  Pulse: 94 94 80 77  Resp: (!) 22 (!) 29 (!) 29 (!) 26  Temp: 99 F (37.2 C) 99.3 F (37.4 C) 99.3 F (37.4 C) 99.3 F (37.4 C)  TempSrc:      SpO2: 96% 97% 98% 98%  Weight:   110.4 kg (243  lb 6.2 oz)   Height:        Intake/Output Summary (Last 24 hours) at 10/08/2017 0743 Last data filed at 10/08/2017 0600 Gross per 24 hour  Intake 2785.83 ml  Output 3130 ml  Net -344.17 ml   Filed Weights   10/05/17 0600 10/06/17 0554 10/08/17 0500  Weight: 107.7 kg (237 lb 7 oz) 109.4 kg (241 lb 2.9 oz) 110.4 kg (243 lb 6.2 oz)    Exam: General exam: In no acute distress.  G-tube in place. Respiratory system: Good air movement and clear to auscultation. Cardiovascular system: S1 & S2 heard, RRR.  Gastrointestinal system: Abdomen is soft, nontender mildly distended with a drain in place Central nervous system: Alert and oriented. No focal neurological deficits. Extremities: No pedal edema. Skin: No rashes, lesions or ulcers Psychiatry: Judgement and insight appear normal. Mood & affect appropriate.    Data Reviewed:    Labs: Basic Metabolic Panel: Recent Labs  Lab 10/05/17 0300 10/06/17 0216 10/07/17 0336 10/07/17 1304 10/08/17 0257  NA 136 141 139 140 141  K 3.9 3.7 3.4* 3.9 3.4*  CL 112* 115* 113* 114* 115*  CO2 16* 19* 18* 19* 20*  GLUCOSE 144* 130* 141* 215* 168*  BUN 40* 37* 33* 36* 40*  CREATININE 1.67* 1.93* 1.83*  1.77* 1.85* 1.88*  CALCIUM 7.7* 8.1* 8.4* 8.4* 8.5*  MG  --  1.7 1.9  --   --   PHOS  --  2.9 3.2  --   --  GFR Estimated Creatinine Clearance: 45.4 mL/min (A) (by C-G formula based on SCr of 1.88 mg/dL (H)). Liver Function Tests: Recent Labs  Lab 10/03/17 0443 10/04/17 0412 10/05/17 0300 10/06/17 0216 10/07/17 1304  AST 43* 53* 103* 83* 30  ALT 156* 115* 176* 144* 75*  ALKPHOS 56 53 143* 123 96  BILITOT 1.3* 1.1 3.4* 3.6* 2.0*  PROT 6.6 7.0 6.2* 6.1* 6.3*  ALBUMIN 3.2* 3.4* 2.6* 2.3* 2.2*   Recent Labs  Lab 10/02/17 0948 10/03/17 0443 10/05/17 0300  LIPASE 30 24 19    No results for input(s): AMMONIA in the last 168 hours. Coagulation profile Recent Labs  Lab 10/04/17 0412  INR 1.04    CBC: Recent Labs  Lab  10/02/17 0948  10/04/17 0556 10/05/17 0300 10/06/17 0719 10/07/17 0336 10/08/17 0257  WBC 10.4   < > 10.8* 7.5 8.0 11.9* 11.2*  NEUTROABS 8.1*  --   --   --  6.6 9.7*  --   HGB 12.3*   < > 10.3* 10.2* 8.9* 9.8* 11.4*  HCT 36.3*   < > 31.0* 31.0* 26.4* 29.4* 34.6*  MCV 85.2   < > 85.2 85.9 84.9 85.2 85.9  PLT 238   < > 226 167 208 297 385   < > = values in this interval not displayed.   Cardiac Enzymes: Recent Labs  Lab 10/06/17 0216 10/07/17 0336 10/07/17 0745 10/07/17 1304 10/07/17 1838  CKTOTAL 115  --   --   --   --   CKMB 22.3*  --   --   --   --   TROPONINI  --  0.06* 0.05* 0.03* 0.03*   BNP (last 3 results) No results for input(s): PROBNP in the last 8760 hours. CBG: Recent Labs  Lab 10/07/17 0824 10/07/17 1212 10/07/17 1648 10/07/17 1949 10/07/17 2325  GLUCAP 163* 187* 204* 194* 177*   D-Dimer: No results for input(s): DDIMER in the last 72 hours. Hgb A1c: No results for input(s): HGBA1C in the last 72 hours. Lipid Profile: Recent Labs    10/07/17 0746  TRIG 362*   Thyroid function studies: No results for input(s): TSH, T4TOTAL, T3FREE, THYROIDAB in the last 72 hours.  Invalid input(s): FREET3 Anemia work up: No results for input(s): VITAMINB12, FOLATE, FERRITIN, TIBC, IRON, RETICCTPCT in the last 72 hours. Sepsis Labs: Recent Labs  Lab 10/02/17 1106 10/02/17 1323 10/02/17 1549  10/05/17 0300 10/06/17 0216 10/06/17 0719 10/06/17 1107 10/07/17 0336 10/08/17 0257  PROCALCITON  --   --   --   --   --   --   --  10.90 6.39 4.53  WBC  --   --   --    < > 7.5  --  8.0  --  11.9* 11.2*  LATICACIDVEN 1.54 2.48* 1.33  --   --  1.1  --   --   --   --    < > = values in this interval not displayed.   Microbiology Recent Results (from the past 240 hour(s))  Culture, blood (Routine X 2) w Reflex to ID Panel     Status: None (Preliminary result)   Collection Time: 10/02/17 10:40 PM  Result Value Ref Range Status   Specimen Description BLOOD LEFT  HAND  Final   Special Requests   Final    BOTTLES DRAWN AEROBIC ONLY Blood Culture adequate volume   Culture   Final    NO GROWTH 4 DAYS Performed at [facility], [address], [place], [place] [address]    Report Status PENDING  Incomplete  Culture, blood (Routine X 2) w Reflex to ID Panel     Status: None (Preliminary result)   Collection Time: 10/02/17 10:40 PM  Result Value Ref Range Status   Specimen Description BLOOD RIGHT HAND  Final   Special Requests   Final    BOTTLES DRAWN AEROBIC AND ANAEROBIC Blood Culture adequate volume   Culture   Final    NO GROWTH 4 DAYS Performed at [facility], [place] [address], [place], [place] [address]    Report Status PENDING  Incomplete  Culture, body fluid-bottle     Status: Abnormal   Collection Time: 10/04/17 12:15 PM  Result Value Ref Range Status   Specimen Description FLUID BILE  Final   Special Requests NONE  Final   Gram Stain (A)  Final    GRAM VARIABLE ROD GRAM POSITIVE COCCI IN BOTH AEROBIC AND ANAEROBIC BOTTLES    Culture (A)  Final    MULTIPLE ORGANISMS PRESENT, NONE PREDOMINANT NO ANAEROBES ISOLATED Performed at [facility], [place] [address] [place], [place] [address]    Report Status 10/07/2017 FINAL  Final  Gram stain     Status: None   Collection Time: 10/04/17 12:15 PM  Result Value Ref Range Status   Specimen Description FLUID  Final   Special Requests NONE  Final   Gram Stain   Final    FEW WBC PRESENT,BOTH PMN AND MONONUCLEAR MODERATE GRAM VARIABLE ROD FEW GRAM POSITIVE COCCI Performed at [facility], [place] [address], [place], [place] [address]    Report Status 10/04/2017 FINAL  Final  Culture, Urine     Status: None   Collection Time: 10/05/17  9:27 AM  Result Value Ref Range Status   Specimen Description URINE, CLEAN CATCH  Final   Special Requests NONE  Final   Culture   Final    NO GROWTH Performed at [facility], [place] [address] [place],  [address]     Report Status 10/06/2017 FINAL  Final     Medications:   . aspirin  81 mg Oral Daily  . heparin  5,000 Units Subcutaneous Q8H  . insulin aspart  2-6 Units Subcutaneous Q4H  . mouth rinse  15 mL Mouth Rinse q12n4p  . senna  1 tablet Oral BID   Continuous Infusions: . dextrose 5% lactated ringers 75 mL/hr at 10/08/17 0600  . famotidine (PEPCID) IV Stopped (10/07/17 2201)  . piperacillin-tazobactam (ZOSYN)  IV Stopped (10/08/17 0554)     LOS: 6 days   Charlynne Cousins  [facility] Pager [phone]  *Please refer to [email or web address], password [email or web address] to get updated schedule on who will round on this patient, as hospitalists switch teams weekly. If 7PM-7AM, please contact night-coverage at [email or web address], password [email or web address] for any overnight needs.  10/08/2017, 7:43 AM

## 2017-10-08 NOTE — Progress Notes (Signed)
[facility] Progress Note     Subjective: CC: wants to eat Patient passing flatus and had loose BM overnight. Denies abdominal pain. Some nausea, controlled with zofran. Tmax 24h 100.   Objective: Vital signs in last 24 hours: Temp:  [99 F (37.2 C)-100 F (37.8 C)] 99.3 F (37.4 C) (01/22 0600) Pulse Rate:  [76-94] 77 (01/22 0600) Resp:  [20-40] 26 (01/22 0600) BP: (129-180)/(62-92) 167/65 (01/22 0600) SpO2:  [94 %-99 %] 98 % (01/22 0600) Weight:  [110.4 kg (243 lb 6.2 oz)] 110.4 kg (243 lb 6.2 oz) (01/22 0500) Last BM Date: 10/07/17  Intake/Output from previous day: 01/21 0701 - 01/22 0700 In: 2785.8 [I.V.:2235.8; IV Piggyback:550] Out: 0350 [Urine:1000; Emesis/NG output:2100; Drains:30] Intake/Output this shift: No intake/output data recorded.  PE: Gen:  Alert, NAD, pleasant Card:  Regular rate and rhythm, pedal pulses 2+ BL, no LE edema BL  Pulm:  Normal effort, mild bibasilar rales Abd: non-tender, distended, bowel sounds normoactive, drain in RUQ with serous output Skin: warm and dry, no rashes  Psych: A&Ox3    Lab Results:  Recent Labs    10/07/17 0336 10/08/17 0257  WBC 11.9* 11.2*  HGB 9.8* 11.4*  HCT 29.4* 34.6*  PLT 297 385   BMET Recent Labs    10/07/17 1304 10/08/17 0257  NA 140 141  K 3.9 3.4*  CL 114* 115*  CO2 19* 20*  GLUCOSE 215* 168*  BUN 36* 40*  CREATININE 1.85* 1.88*  CALCIUM 8.4* 8.5*   PT/INR No results for input(s): LABPROT, INR in the last 72 hours. CMP     Component Value Date/Time   NA 141 10/08/2017 0257   K 3.4 (L) 10/08/2017 0257   CL 115 (H) 10/08/2017 0257   CO2 20 (L) 10/08/2017 0257   GLUCOSE 168 (H) 10/08/2017 0257   BUN 40 (H) 10/08/2017 0257   CREATININE 1.88 (H) 10/08/2017 0257   CALCIUM 8.5 (L) 10/08/2017 0257   PROT 6.3 (L) 10/07/2017 1304   ALBUMIN 2.2 (L) 10/07/2017 1304   AST 30 10/07/2017 1304   ALT 75 (H) 10/07/2017 1304   ALKPHOS 96 10/07/2017 1304   BILITOT 2.0 (H) 10/07/2017  1304   GFRNONAA 35 (L) 10/08/2017 0257   GFRAA 40 (L) 10/08/2017 0257   Lipase     Component Value Date/Time   LIPASE 19 10/05/2017 0300       Studies/Results: Dg Chest Port 1 View  Result Date: 10/08/2017 CLINICAL DATA:  Respiratory failure. EXAM: PORTABLE CHEST 1 VIEW COMPARISON:  10/05/2017 FINDINGS: Enteric tube has been pulled back as tip is just below the expected region of the gastroesophageal junction and side-port over the distal esophagus. This could be advanced approximately 10 cm. Interval removal of endotracheal tube. Lungs are somewhat hypoinflated with minimal linear atelectasis/scarring over the left base. Cardiomediastinal silhouette and remainder the exam is unchanged. IMPRESSION: Subtle linear atelectasis/scarring left base. Enteric tube has been pulled pulled back as tip is now just below the expected region of the gastroesophageal junction as this could be advanced approximately 10 cm. These results will be called to the ordering clinician or representative by the Radiologist Assistant, and communication documented in the PACS or zVision Dashboard. Electronically Signed   By: Marin Olp M.D.   On: 10/08/2017 07:19   Dg Abd Portable 1v  Result Date: 10/08/2017 CLINICAL DATA:  Ileus. EXAM: PORTABLE ABDOMEN - 1 VIEW COMPARISON:  10/07/2017 FINDINGS: Percutaneous cholecystostomy tube unchanged. Continued evidence of several air-filled dilated small bowel loops  in the central abdomen slightly decreased and caliber and number as the most dilated loop measures 4.8 cm. Air is present throughout the colon. No free peritoneal air remainder the exam is unchanged. IMPRESSION: Findings compatible with known ileus with mild interval improvement. Cholecystostomy catheter unchanged. Electronically Signed   By: Marin Olp M.D.   On: 10/08/2017 07:21   Dg Abd Portable 1v  Result Date: 10/07/2017 CLINICAL DATA:  Vomiting.  History of cholecystostomy tube. EXAM: PORTABLE ABDOMEN - 1  VIEW COMPARISON:  CT abdomen pelvis-10/04/2017; ultrasound and CT guided cholecystostomy tube placement-10/04/2017 FINDINGS: There is moderate to marked gaseous distention of multiple loops of small bowel with index loop of small bowel within the left mid hemiabdomen measuring approximately 5.2 cm in diameter. This finding is associated with a conspicuous paucity of distal colonic gas. Nondiagnostic evaluation for pneumoperitoneum secondary to supine positioning and exclusion of the lower thorax. No definite pneumatosis or portal venous gas. Cholecystostomy tube overlies expected location of the gallbladder fossa. A catheter overlies the expected location of the urinary bladder. 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 Several phleboliths overlie the lower pelvis bilaterally. IMPRESSION: Findings worrisome for developing small bowel obstruction. Clinical correlation and continued attention on follow-up is advised. Electronically Signed   By: Sandi Mariscal M.D.   On: 10/07/2017 10:10    Anti-infectives: Anti-infectives (From admission, onward)   Start     Dose/Rate Route Frequency Ordered Stop   10/07/17 1200  vancomycin (VANCOCIN) IVPB 1000 mg/200 mL premix  Status:  Discontinued     1,000 mg 200 mL/hr over 60 Minutes Intravenous Every 24 hours 10/06/17 1114 10/07/17 1657   10/06/17 1200  vancomycin (VANCOCIN) 1,500 mg in sodium chloride 0.9 % 500 mL IVPB     1,500 mg 250 mL/hr over 120 Minutes Intravenous  Once 10/06/17 1114 10/06/17 1423   10/02/17 2200  piperacillin-tazobactam (ZOSYN) IVPB 3.375 g     3.375 g 12.5 mL/hr over 240 Minutes Intravenous Every 8 hours 10/02/17 1257     10/02/17 1400  vancomycin (VANCOCIN) IVPB 1000 mg/200 mL premix  Status:  Discontinued     1,000 mg 200 mL/hr over 60 Minutes Intravenous Every 24 hours 10/02/17 1257 10/02/17 1843   10/02/17 1300   piperacillin-tazobactam (ZOSYN) IVPB 3.375 g     3.375 g 100 mL/hr over 30 Minutes Intravenous  Once 10/02/17 1257 10/02/17 1352       Assessment/Plan HTN Acute respiratory failure with hypoxia  - patient extubated 1/20, tolerating well AKI (acute kidney injury) - Cr 1.88 Atrial fibrillation - now in NSR  Sepsis/Septic shock  Acute cholecystitis s/p perc cholecystomy drainage 10/04/2017 - drain with 30 cc/24h - WBC - 11.2 today; Tmax up to 100 in 24h but curve trending down this AM - T. Bili - 2.0 10/06/17 - continue Zosyn  - mobilize as tolerated - would consider removing foley Ileus - KUB this AM still shows dilated loops of small bowel  - having BMs and passing flatus - NGT put out 2100 yesterday but patient taking some ice water - NGT clamping trial and allow sips of clears around NGT, may be able to remove NGT later today or tomorrow AM  FEN: sips of clears, IVF; replace K with goal of 4.0 in setting of ileus VTE: SCDs, SQ heparin  ID: IV zosyn 1/16>>; IV vanc 1/20>>  PRN antiemetics. Clamp NGT and allow sips of liquids. Replace  K. Mobilize more.     LOS: 6 days    Brigid Re , [facility] 10/08/2017, 8:32 AM Pager: [phone] Consults: [phone] Mon-Fri 7:00 am-4:30 pm Sat-Sun 7:00 am-11:30 am

## 2017-10-09 ENCOUNTER — Inpatient Hospital Stay (HOSPITAL_COMMUNITY): Payer: Medicare HMO

## 2017-10-09 LAB — HEPATIC FUNCTION PANEL
ALK PHOS: 70 U/L (ref 38–126)
ALT: 49 U/L (ref 17–63)
AST: 30 U/L (ref 15–41)
Albumin: 1.9 g/dL — ABNORMAL LOW (ref 3.5–5.0)
BILIRUBIN INDIRECT: 0.6 mg/dL (ref 0.3–0.9)
Bilirubin, Direct: 0.8 mg/dL — ABNORMAL HIGH (ref 0.1–0.5)
Total Bilirubin: 1.4 mg/dL — ABNORMAL HIGH (ref 0.3–1.2)
Total Protein: 5.6 g/dL — ABNORMAL LOW (ref 6.5–8.1)

## 2017-10-09 LAB — MRSA PCR SCREENING: MRSA BY PCR: NEGATIVE

## 2017-10-09 LAB — GLUCOSE, CAPILLARY
GLUCOSE-CAPILLARY: 146 mg/dL — AB (ref 65–99)
GLUCOSE-CAPILLARY: 201 mg/dL — AB (ref 65–99)
GLUCOSE-CAPILLARY: 179 mg/dL — AB (ref 65–99)
GLUCOSE-CAPILLARY: 164 mg/dL — AB (ref 65–99)
Glucose-Capillary: 153 mg/dL — ABNORMAL HIGH (ref 65–99)
Glucose-Capillary: 160 mg/dL — ABNORMAL HIGH (ref 65–99)

## 2017-10-09 LAB — BASIC METABOLIC PANEL
Anion gap: 8 (ref 5–15)
BUN: 35 mg/dL — AB (ref 6–20)
CALCIUM: 8.2 mg/dL — AB (ref 8.9–10.3)
CO2: 19 mmol/L — ABNORMAL LOW (ref 22–32)
Chloride: 110 mmol/L (ref 101–111)
Creatinine, Ser: 1.75 mg/dL — ABNORMAL HIGH (ref 0.61–1.24)
GFR calc Af Amer: 44 mL/min — ABNORMAL LOW (ref >60)
GFR, EST NON AFRICAN AMERICAN: 38 mL/min — AB (ref >60)
GLUCOSE: 152 mg/dL — AB (ref 65–99)
Potassium: 3.6 mmol/L (ref 3.5–5.1)
Sodium: 137 mmol/L (ref 135–145)

## 2017-10-09 MED ORDER — SODIUM CHLORIDE 0.9 % IV SOLN
INTRAVENOUS | Status: DC
  Administered 2017-10-09: 15:00:00 via INTRAVENOUS

## 2017-10-09 MED ORDER — AMLODIPINE BESYLATE 10 MG PO TABS: 10.0000 mg | ORAL_TABLET | Freq: Every day | ORAL | Status: DC

## 2017-10-09 MED ORDER — HYDRALAZINE HCL 25 MG PO TABS
25.0000 mg | ORAL_TABLET | Freq: Three times a day (TID) | ORAL | Status: DC
  Administered 2017-10-09 – 2017-10-11 (×6): 25 mg via ORAL
  Filled 2017-10-09 (×6): qty 1

## 2017-10-09 MED ORDER — CAMPHOR-MENTHOL 0.5-0.5 % EX LOTN
TOPICAL_LOTION | CUTANEOUS | Status: DC | PRN
  Filled 2017-10-09: qty 222

## 2017-10-09 NOTE — Plan of Care (Signed)
  Progressing Activity: Risk for activity intolerance will decrease 10/09/2017 1303 - Progressing by Naomie Dean, RN Pt ambulated in hall with PT Nutrition: Adequate nutrition will be maintained 10/09/2017 1303 - Progressing by Naomie Dean, RN Pt now on soft diet Elimination: Will not experience complications related to urinary retention 10/09/2017 1303 - Progressing by Naomie Dean, RN Pt voided after foley was DCd.

## 2017-10-09 NOTE — Progress Notes (Signed)
Pharmacy Antibiotic Note  Timothy Chapman is a 72 y.o. male admitted on 10/02/2017 with intra-abdominal infection.  Pharmacy has been consulted for Zosyn dosing.  Acute cholecystitis s/p perc cholecystomy drainage 10/04/2017.  Today, 10/09/2017 Tmax 99.5 WBC 11.2 SCr 1.75, CrCl ~49 ml/min PCT 10.9 > 6.39 > 4.53  Plan:  Continue Zosyn 3.375g IV Q8H infused over 4hrs.  What is planned duration of therapy?   Height: 5\' 9"  (175.3 cm) Weight: 243 lb 6.2 oz (110.4 kg) IBW/kg (Calculated) : 70.7  Temp (24hrs), Avg:99.3 F (37.4 C), Min:98.4 F (36.9 C), Max:100.2 F (37.9 C)  Recent Labs  Lab 10/02/17 1323 10/02/17 1549  10/04/17 0556  10/05/17 0300 10/06/17 0216 10/06/17 0719 10/07/17 0336 10/07/17 1304 10/08/17 0257 10/09/17 0307  WBC  --   --    < > 10.8*  --  7.5  --  8.0 11.9*  --  11.2*  --   CREATININE  --   --    < >  --    < > 1.67* 1.93*  --  1.83*  1.77* 1.85* 1.88* 1.75*  LATICACIDVEN 2.48* 1.33  --   --   --   --  1.1  --   --   --   --   --    < > = values in this interval not displayed.    Estimated Creatinine Clearance: 48.8 mL/min (A) (by C-G formula based on SCr of 1.75 mg/dL (H)).    Allergies  Allergen Reactions  . Codeine     Antimicrobials this admission:  1/16 Vanc >> 1/16, resumed 1/20 >> 1/21 1/16 Zosyn >>  Dose adjustments this admission:   Microbiology results:  1/16 BCx: NGF 1/18 Bile fluid: Gram variable rod, GPC --> multiple organisms present, none predominant 1/19 UCx: NGF  Thank you for allowing pharmacy to be a part of this patient's care.  Peggyann Juba, PharmD, BCPS Pager: [phone] 10/09/2017 12:52 PM

## 2017-10-09 NOTE — Progress Notes (Signed)
Physical Therapy Treatment Patient Details Name: Timothy Chapman MRN: [id number] DOB: 02-May-1946 Today's Date: 10/09/2017    History of Present Illness Pt admitted with vomiting/diarrhea and dx with multiple organ failure.  Pt with hx of DM, L TKR and back sx x 2.    PT Comments    Pt in good spirits and demonstrating marked improvement in ambulatory stability and activity tolerance.   Follow Up Recommendations  Home health PT     Equipment Recommendations  None recommended by PT    Recommendations for Other Services OT consult     Precautions / Restrictions Precautions Precautions: Fall Restrictions Weight Bearing Restrictions: No    Mobility  Bed Mobility               General bed mobility comments: OOB with nursing and requests back to chair to eat  Transfers Overall transfer level: Needs assistance Equipment used: Rolling walker (2 wheeled) Transfers: Sit to/from Stand Sit to Stand: Min assist         General transfer comment: cues for use of UEs to self assist and physical assist to bring wt up and fwd and to balance in standing  Ambulation/Gait Ambulation/Gait assistance: Min assist Ambulation Distance (Feet): 200 Feet Assistive device: Rolling walker (2 wheeled) Gait Pattern/deviations: Step-to pattern;Step-through pattern;Decreased step length - right;Decreased step length - left;Shuffle;Trunk flexed Gait velocity: decr Gait velocity interpretation: Below normal speed for age/gender General Gait Details: cues for posture, position from RW, pacing and safety awareness.    Stairs            Wheelchair Mobility    Modified Rankin (Stroke Patients Only)       Balance Overall balance assessment: Needs assistance Sitting-balance support: Feet supported;Bilateral upper extremity supported Sitting balance-Leahy Scale: Good     Standing balance support: Bilateral upper extremity supported Standing balance-Leahy Scale: Poor                               Cognition Arousal/Alertness: Awake/alert Behavior During Therapy: WFL for tasks assessed/performed Overall Cognitive Status: Within Functional Limits for tasks assessed                                        Exercises      General Comments        Pertinent Vitals/Pain Pain Assessment: No/denies pain    Home Living                      Prior Function            PT Goals (current goals can now be found in the care plan section) Acute Rehab PT Goals Patient Stated Goal: Regain IND PT Goal Formulation: With patient Time For Goal Achievement: 10/21/17 Potential to Achieve Goals: Fair Progress towards PT goals: Progressing toward goals    Frequency    Min 3X/week      PT Plan Current plan remains appropriate    Co-evaluation              AM-PAC PT "6 Clicks" Daily Activity  Outcome Measure  Difficulty turning over in bed (including adjusting bedclothes, sheets and blankets)?: A Lot Difficulty moving from lying on back to sitting on the side of the bed? : A Lot Difficulty sitting down on and standing up from a chair with  arms (e.g., wheelchair, bedside commode, etc,.)?: A Lot Help needed moving to and from a bed to chair (including a wheelchair)?: A Little Help needed walking in hospital room?: A Little Help needed climbing 3-5 steps with a railing? : A Lot 6 Click Score: 14    End of Session   Activity Tolerance: Patient tolerated treatment well;Patient limited by fatigue Patient left: in chair;with call bell/phone within reach;with family/visitor present Nurse Communication: Mobility status PT Visit Diagnosis: Unsteadiness on feet (R26.81);Muscle weakness (generalized) (M62.81);Difficulty in walking, not elsewhere classified (R26.2)     Time: 2202-5427 PT Time Calculation (min) (ACUTE ONLY): 26 min  Charges:  $Gait Training: 23-37 mins                    G Codes:       Pg [phone]  [id number]    Nazaire Cordial 10/09/2017, 11:59 AM

## 2017-10-09 NOTE — Progress Notes (Signed)
[facility] Progress Note     Subjective: CC: wants NGT out Patient tolerating NGT clamping and trial of CLD. No nausea and abdomen non-tender. Having BMs and passing lots of flatus. UOP good. VSS.   Objective: Vital signs in last 24 hours: Temp:  [98.6 F (37 C)-100.2 F (37.9 C)] 98.6 F (37 C) (01/23 0500) Pulse Rate:  [62-88] 62 (01/23 0708) Resp:  [20-40] 27 (01/23 0708) BP: (153-198)/(60-149) 177/83 (01/23 0708) SpO2:  [94 %-99 %] 94 % (01/23 0708) Last BM Date: 10/08/17  Intake/Output from previous day: 01/22 0701 - 01/23 0700 In: 370 [P.O.:120; IV Piggyback:250] Out: 1763 [Urine:1701; Drains:60; Stool:2] Intake/Output this shift: No intake/output data recorded.  PE: Gen: Alert, NAD, pleasant Card: Regular rate and rhythm Pulm: Normal effort Abd: non-tender, mildly distended, bowel soundsnormoactive,drain in RUQ with serous output Skin: warm and dry, no rashes  Psych: A&Ox3    Lab Results:  Recent Labs    10/07/17 0336 10/08/17 0257  WBC 11.9* 11.2*  HGB 9.8* 11.4*  HCT 29.4* 34.6*  PLT 297 385   BMET Recent Labs    10/07/17 1304 10/08/17 0257  NA 140 141  K 3.9 3.4*  CL 114* 115*  CO2 19* 20*  GLUCOSE 215* 168*  BUN 36* 40*  CREATININE 1.85* 1.88*  CALCIUM 8.4* 8.5*   PT/INR No results for input(s): LABPROT, INR in the last 72 hours. CMP     Component Value Date/Time   NA 141 10/08/2017 0257   K 3.4 (L) 10/08/2017 0257   CL 115 (H) 10/08/2017 0257   CO2 20 (L) 10/08/2017 0257   GLUCOSE 168 (H) 10/08/2017 0257   BUN 40 (H) 10/08/2017 0257   CREATININE 1.88 (H) 10/08/2017 0257   CALCIUM 8.5 (L) 10/08/2017 0257   PROT 5.6 (L) 10/09/2017 0307   ALBUMIN 1.9 (L) 10/09/2017 0307   AST 30 10/09/2017 0307   ALT 49 10/09/2017 0307   ALKPHOS 70 10/09/2017 0307   BILITOT 1.4 (H) 10/09/2017 0307   GFRNONAA 35 (L) 10/08/2017 0257   GFRAA 40 (L) 10/08/2017 0257   Lipase     Component Value Date/Time   LIPASE 19  10/05/2017 0300       Studies/Results: Dg Abd 1 View  Result Date: 10/09/2017 CLINICAL DATA:  Follow-up ileus EXAM: ABDOMEN - 1 VIEW COMPARISON:  10/08/2017 FINDINGS: Scattered large and small bowel gas is noted. The degree of small bowel dilatation has nearly completely resolved in the interval from the prior exam. No free air is seen. A percutaneous cholecystostomy tube is again noted. Degenerative changes of the lumbar spine are seen. IMPRESSION: Near complete resolution of small bowel dilatation when compare with the prior exam. Electronically Signed   By: Inez Catalina M.D.   On: 10/09/2017 07:16   US Renal  Result Date: 10/08/2017 CLINICAL DATA:  Renal failure EXAM: RENAL ULTRASOUND COMPARISON:  CT abdomen and pelvis October 04, 2017 FINDINGS: Right Kidney: Length: 13.5 cm. Echogenicity and renal cortical thickness are within normal limits. No perinephric fluid or hydronephrosis visualized. There is a cyst in the mid right kidney measuring 3.9 x 3.8 x 3.0 cm. No sonographically demonstrable calculus or ureterectasis. Left Kidney: Length: 12.8 cm. Echogenicity and renal cortical thickness are within normal limits. No mass, perinephric fluid, or hydronephrosis visualized. No sonographically demonstrable calculus or ureterectasis. Bladder: Decompressed with Foley catheter and cannot be assessed. Moderate ascites noted. IMPRESSION: Cyst arising from the mid right kidney. Kidneys bilaterally otherwise appear unremarkable. There is moderate  ascites. Electronically Signed   By: Lowella Grip III M.D.   On: 10/08/2017 18:57   Dg Chest Port 1 View  Result Date: 10/08/2017 CLINICAL DATA:  Respiratory failure. EXAM: PORTABLE CHEST 1 VIEW COMPARISON:  10/05/2017 FINDINGS: Enteric tube has been pulled back as tip is just below the expected region of the gastroesophageal junction and side-port over the distal esophagus. This could be advanced approximately 10 cm. Interval removal of endotracheal tube.  Lungs are somewhat hypoinflated with minimal linear atelectasis/scarring over the left base. Cardiomediastinal silhouette and remainder the exam is unchanged. IMPRESSION: Subtle linear atelectasis/scarring left base. Enteric tube has been pulled pulled back as tip is now just below the expected region of the gastroesophageal junction as this could be advanced approximately 10 cm. These results will be called to the ordering clinician or representative by the Radiologist Assistant, and communication documented in the PACS or zVision Dashboard. Electronically Signed   By: Marin Olp M.D.   On: 10/08/2017 07:19   Dg Abd Portable 1v  Result Date: 10/08/2017 CLINICAL DATA:  Ileus. EXAM: PORTABLE ABDOMEN - 1 VIEW COMPARISON:  10/07/2017 FINDINGS: Percutaneous cholecystostomy tube unchanged. Continued evidence of several air-filled dilated small bowel loops in the central abdomen slightly decreased and caliber and number as the most dilated loop measures 4.8 cm. Air is present throughout the colon. No free peritoneal air remainder the exam is unchanged. IMPRESSION: Findings compatible with known ileus with mild interval improvement. Cholecystostomy catheter unchanged. Electronically Signed   By: Marin Olp M.D.   On: 10/08/2017 07:21   Dg Abd Portable 1v  Result Date: 10/07/2017 CLINICAL DATA:  Vomiting.  History of cholecystostomy tube. EXAM: PORTABLE ABDOMEN - 1 VIEW COMPARISON:  CT abdomen pelvis-10/04/2017; ultrasound and CT guided cholecystostomy tube placement-10/04/2017 FINDINGS: There is moderate to marked gaseous distention of multiple loops of small bowel with index loop of small bowel within the left mid hemiabdomen measuring approximately 5.2 cm in diameter. This finding is associated with a conspicuous paucity of distal colonic gas. Nondiagnostic evaluation for pneumoperitoneum secondary to supine positioning and exclusion of the lower thorax. No definite pneumatosis or portal venous gas.  Cholecystostomy tube overlies expected location of the gallbladder fossa. A catheter overlies the expected location of the urinary bladder. 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 Several phleboliths overlie the lower pelvis bilaterally. IMPRESSION: Findings worrisome for developing small bowel obstruction. Clinical correlation and continued attention on follow-up is advised. Electronically Signed   By: Sandi Mariscal M.D.   On: 10/07/2017 10:10    Anti-infectives: Anti-infectives (From admission, onward)   Start     Dose/Rate Route Frequency Ordered Stop   10/07/17 1200  vancomycin (VANCOCIN) IVPB 1000 mg/200 mL premix  Status:  Discontinued     1,000 mg 200 mL/hr over 60 Minutes Intravenous Every 24 hours 10/06/17 1114 10/07/17 1657   10/06/17 1200  vancomycin (VANCOCIN) 1,500 mg in sodium chloride 0.9 % 500 mL IVPB     1,500 mg 250 mL/hr over 120 Minutes Intravenous  Once 10/06/17 1114 10/06/17 1423   10/02/17 2200  piperacillin-tazobactam (ZOSYN) IVPB 3.375 g     3.375 g 12.5 mL/hr over 240 Minutes Intravenous Every 8 hours 10/02/17 1257     10/02/17 1400  vancomycin (VANCOCIN) IVPB 1000 mg/200 mL premix  Status:  Discontinued     1,000 mg 200 mL/hr over 60 Minutes Intravenous Every 24 hours 10/02/17 1257 10/02/17  1843   10/02/17 1300  piperacillin-tazobactam (ZOSYN) IVPB 3.375 g     3.375 g 100 mL/hr over 30 Minutes Intravenous  Once 10/02/17 1257 10/02/17 1352       Assessment/Plan HTN Acute respiratory failure with hypoxia- patient extubated 1/20, tolerating well AKI (acute kidney injury)- Cr 1.88 yesterday Atrial fibrillation - now in NSR  Sepsis/Septic shock  Acute cholecystitis s/p perc cholecystomy drainage 10/04/2017 - drain with 60 cc/24h -WBC -11.2 yesterday; Tmax up to 100.2 in 24hbut curve trending down this AM -T. Bili - 1.4, trending down -  continueZosyn - mobilize as tolerated Ileus - KUB this AM shows resolution of ileus - having BMs and passing flatus - d/c NGT and start soft diet  FEN: soft diet VTE: SCDs, SQ heparin  ID: IV zosyn 1/16>>; IV vanc 1/20>>    LOS: 7 days    Brigid Re , [facility] 10/09/2017, 8:32 AM Pager: [phone] Consults: [phone] Mon-Fri 7:00 am-4:30 pm Sat-Sun 7:00 am-11:30 am

## 2017-10-09 NOTE — Progress Notes (Addendum)
PROGRESS NOTE    Timothy Chapman  FIE:[id number] DOB: 08-13-1946 DOA: 10/02/2017 PCP: [name], MD   Brief Narrative: Patient is a 72 year old male with past medical history of hypertension, diabetes who presented with complaints of abdominal pain and fever.  Found to have elevated LFTs.  He was diagnosed with cholangitis.  Also type of acute respiratory failure with hypoxia.  He had to be admitted to ICU after intubation.  He is currently status post cholecystostomy on 10/04/17.  Surgery is following.  Assessment & Plan:   Principal Problem:   Acute cholecystitis s/p perc cholecystomy drainage 10/04/2017 Active Problems:   HYPERTENSION, BENIGN ESSENTIAL, UNCONTROLLED   Abdominal pain   Sepsis ([place])   Acute respiratory failure with hypoxia ([facility])   AKI (acute kidney injury) ([place])   Septic shock (HCC)   Acute cholangitis   SBO (small bowel obstruction) (HCC)   ATN (acute tubular necrosis) (HCC)  Acute respiratory failure with hypoxia: Resolved.  Currently saturating fine on room air.  No respiratory issues currently. Intubated on 10/04/2016 extubated on 10/06/2017  Acute cholecystitis/cholangitis/sepsis shock:  s/p perc cholecystomy drainage 10/04/2017 Surgery on board. IR managing the cholecystostomy drain.  Drain is still yielding straw-colored fluid LFTs are trending down. Continue empiric IV antibiotics. On  ZosynPatient is afebrile.  Uncontrolled  hypertension:  Started on oral hydralazine.  Cannot tolerate amlodipine due to leg edema.  Continue labetelol and hydralazine as needed. He is on benazepril at home.  It is on hold due to acute kidney injury.  AKI (acute kidney injury) ([place]) Baseline creatinine around 1.0 Probably ATN.  Kidney function improving  with IV fluids.Started on gentle IV fluids. Renal ultrasound showed mid right kidney cyst.  Probably needs follow-up as an outpatient with nephrology  Ileus: Likely due to sepsis in the setting of cholangitis,  surgery on board. Patient has been started on soft diet.  NG tube removed. Abdominal x-ray this morning showed complete resolution of small bowel dilatation .  diabetes mellitus type II: Continue sliding scale insulin.  Hypokalemia: Corrected.     DVT prophylaxis: Heparin [place] Code Status: Full Family Communication: None present at the bedside Disposition Plan: Home with home health   Consultants: Surgery  Procedures: s/p perc cholecystomy drainage 10/04/2017  Antimicrobials:Zosyn  Subjective: Patient seen and examined the bedside this morning.  Remains comfortable.  Denies any abdominal pain, nausea or vomiting.  Still hypertensive.   Objective: Vitals:   10/09/17 1100 10/09/17 1114 10/09/17 1159 10/09/17 1200  BP: (!) 202/74 (!) 158/64  (!) 153/59  Pulse: 76 78  73  Resp: (!) 22 (!) 25  (!) 22  Temp:   98.4 F (36.9 C)   TempSrc:   Oral   SpO2: 97% 98%  98%  Weight:      Height:        Intake/Output Summary (Last 24 hours) at 10/09/2017 1314 Last data filed at 10/09/2017 0600 Gross per 24 hour  Intake 370 ml  Output 1763 ml  Net -1393 ml   Filed Weights   10/05/17 0600 10/06/17 0554 10/08/17 0500  Weight: 107.7 kg (237 lb 7 oz) 109.4 kg (241 lb 2.9 oz) 110.4 kg (243 lb 6.2 oz)    Examination:  General exam: Appears calm and comfortable ,Not in distress,obese Respiratory system: Bilateral equal air entry, normal vesicular breath sounds, no wheezes or crackles  Cardiovascular system: S1 & S2 heard, RRR. No JVD, murmurs, rubs, gallops or clicks. No pedal edema. Gastrointestinal system: Abdomen is mildly  distended, soft and nontender. No organomegaly or masses felt. Normal bowel sounds heard. Cholecystostomy drain with bag attached Central nervous system: Alert and oriented. No focal neurological deficits. Extremities: 2 + pitting  edema, no clubbing ,no cyanosis, distal peripheral pulses palpable. Skin: No rashes, lesions or ulcers,no icterus ,no  pallor Psychiatry: Judgement and insight appear normal. Mood & affect appropriate.     Data Reviewed: I have personally reviewed following labs and imaging studies  CBC: Recent Labs  Lab 10/04/17 0556 10/05/17 0300 10/06/17 0719 10/07/17 0336 10/08/17 0257  WBC 10.8* 7.5 8.0 11.9* 11.2*  NEUTROABS  --   --  6.6 9.7*  --   HGB 10.3* 10.2* 8.9* 9.8* 11.4*  HCT 31.0* 31.0* 26.4* 29.4* 34.6*  MCV 85.2 85.9 84.9 85.2 85.9  PLT 226 167 208 297 240   Basic Metabolic Panel: Recent Labs  Lab 10/06/17 0216 10/07/17 0336 10/07/17 1304 10/08/17 0257 10/09/17 0307  NA 141 139 140 141 137  K 3.7 3.4* 3.9 3.4* 3.6  CL 115* 113* 114* 115* 110  CO2 19* 18* 19* 20* 19*  GLUCOSE 130* 141* 215* 168* 152*  BUN 37* 33* 36* 40* 35*  CREATININE 1.93* 1.83*  1.77* 1.85* 1.88* 1.75*  CALCIUM 8.1* 8.4* 8.4* 8.5* 8.2*  MG 1.7 1.9  --   --   --   PHOS 2.9 3.2  --   --   --    GFR: Estimated Creatinine Clearance: 48.8 mL/min (A) (by C-G formula based on SCr of 1.75 mg/dL (H)). Liver Function Tests: Recent Labs  Lab 10/04/17 0412 10/05/17 0300 10/06/17 0216 10/07/17 1304 10/09/17 0307  AST 53* 103* 83* 30 30  ALT 115* 176* 144* 75* 49  ALKPHOS 53 143* 123 96 70  BILITOT 1.1 3.4* 3.6* 2.0* 1.4*  PROT 7.0 6.2* 6.1* 6.3* 5.6*  ALBUMIN 3.4* 2.6* 2.3* 2.2* 1.9*   Recent Labs  Lab 10/03/17 0443 10/05/17 0300  LIPASE 24 19   No results for input(s): AMMONIA in the last 168 hours. Coagulation Profile: Recent Labs  Lab 10/04/17 0412  INR 1.04   Cardiac Enzymes: Recent Labs  Lab 10/06/17 0216 10/07/17 0336 10/07/17 0745 10/07/17 1304 10/07/17 1838  CKTOTAL 115  --   --   --   --   CKMB 22.3*  --   --   --   --   TROPONINI  --  0.06* 0.05* 0.03* 0.03*   BNP (last 3 results) No results for input(s): PROBNP in the last 8760 hours. HbA1C: No results for input(s): HGBA1C in the last 72 hours. CBG: Recent Labs  Lab 10/08/17 1934 10/08/17 2320 10/09/17 0313 10/09/17 0751  10/09/17 1151  GLUCAP 131* 133* 153* 146* 201*   Lipid Profile: Recent Labs    10/07/17 0746  TRIG 362*   Thyroid Function Tests: No results for input(s): TSH, T4TOTAL, FREET4, T3FREE, THYROIDAB in the last 72 hours. Anemia Panel: No results for input(s): VITAMINB12, FOLATE, FERRITIN, TIBC, IRON, RETICCTPCT in the last 72 hours. Sepsis Labs: Recent Labs  Lab 10/02/17 1323 10/02/17 1549 10/06/17 0216 10/06/17 1107 10/07/17 0336 10/08/17 0257  PROCALCITON  --   --   --  10.90 6.39 4.53  LATICACIDVEN 2.48* 1.33 1.1  --   --   --     Recent Results (from the past 240 hour(s))  Culture, blood (Routine X 2) w Reflex to ID Panel     Status: None   Collection Time: 10/02/17 10:40 PM  Result Value Ref  Range Status   Specimen Description BLOOD LEFT HAND  Final   Special Requests   Final    BOTTLES DRAWN AEROBIC ONLY Blood Culture adequate volume   Culture   Final    NO GROWTH 5 DAYS Performed at [facility], [address] [place], [place] [address]    Report Status 10/08/2017 FINAL  Final  Culture, blood (Routine X 2) w Reflex to ID Panel     Status: None   Collection Time: 10/02/17 10:40 PM  Result Value Ref Range Status   Specimen Description BLOOD RIGHT HAND  Final   Special Requests   Final    BOTTLES DRAWN AEROBIC AND ANAEROBIC Blood Culture adequate volume   Culture   Final    NO GROWTH 5 DAYS Performed at [facility], [place] [address] [place], [place] [address]    Report Status 10/08/2017 FINAL  Final  Culture, body fluid-bottle     Status: Abnormal   Collection Time: 10/04/17 12:15 PM  Result Value Ref Range Status   Specimen Description FLUID BILE  Final   Special Requests NONE  Final   Gram Stain (A)  Final    GRAM VARIABLE ROD GRAM POSITIVE COCCI IN BOTH AEROBIC AND ANAEROBIC BOTTLES    Culture (A)  Final    MULTIPLE ORGANISMS PRESENT, NONE PREDOMINANT NO ANAEROBES ISOLATED Performed at [facility], [place] [address],  [place], [place] [address]    Report Status 10/07/2017 FINAL  Final  Gram stain     Status: None   Collection Time: 10/04/17 12:15 PM  Result Value Ref Range Status   Specimen Description FLUID  Final   Special Requests NONE  Final   Gram Stain   Final    FEW WBC PRESENT,BOTH PMN AND MONONUCLEAR MODERATE GRAM VARIABLE ROD FEW GRAM POSITIVE COCCI Performed at [facility], [place] [address], [place], [place] [address]    Report Status 10/04/2017 FINAL  Final  Culture, Urine     Status: None   Collection Time: 10/05/17  9:27 AM  Result Value Ref Range Status   Specimen Description URINE, CLEAN CATCH  Final   Special Requests NONE  Final   Culture   Final    NO GROWTH Performed at [facility], [place] [address], [place], [place] [address]    Report Status 10/06/2017 FINAL  Final         Radiology Studies: Dg Abd 1 View  Result Date: 10/09/2017 CLINICAL DATA:  Follow-up ileus EXAM: ABDOMEN - 1 VIEW COMPARISON:  10/08/2017 FINDINGS: Scattered large and small bowel gas is noted. The degree of small bowel dilatation has nearly completely resolved in the interval from the prior exam. No free air is seen. A percutaneous cholecystostomy tube is again noted. Degenerative changes of the lumbar spine are seen. IMPRESSION: Near complete resolution of small bowel dilatation when compare with the prior exam. Electronically Signed   By: Inez Catalina M.D.   On: 10/09/2017 07:16   US Renal  Result Date: 10/08/2017 CLINICAL DATA:  Renal failure EXAM: RENAL ULTRASOUND COMPARISON:  CT abdomen and pelvis October 04, 2017 FINDINGS: Right Kidney: Length: 13.5 cm. Echogenicity and renal cortical thickness are within normal limits. No perinephric fluid or hydronephrosis visualized. There is a cyst in the mid right kidney measuring 3.9 x 3.8 x 3.0 cm. No sonographically demonstrable calculus or ureterectasis. Left Kidney: Length: 12.8 cm. Echogenicity and renal cortical thickness are within normal limits.  No mass, perinephric fluid, or hydronephrosis visualized. No sonographically demonstrable calculus or ureterectasis. Bladder: Decompressed with Foley catheter and cannot be assessed. Moderate ascites noted. IMPRESSION: Cyst arising from the mid right kidney. Kidneys bilaterally otherwise appear unremarkable. There is moderate ascites. Electronically Signed   By: Lowella Grip III M.D.   On: 10/08/2017 18:57   Dg Chest Port 1 View  Result Date: 10/08/2017 CLINICAL DATA:  Respiratory failure. EXAM: PORTABLE CHEST 1 VIEW COMPARISON:  10/05/2017 FINDINGS: Enteric tube has been pulled back as tip is just below the expected region of the gastroesophageal junction and side-port over the distal esophagus. This could be advanced approximately 10 cm. Interval removal of endotracheal tube. Lungs are somewhat hypoinflated with minimal linear atelectasis/scarring over the left base. Cardiomediastinal silhouette and remainder the exam is unchanged. IMPRESSION: Subtle linear atelectasis/scarring left base. Enteric tube has been pulled pulled back as tip is now just below the expected region of the gastroesophageal junction as this could be advanced approximately 10 cm. These results will be called to the ordering clinician or representative by the Radiologist Assistant, and communication documented in the PACS or zVision Dashboard. Electronically Signed   By: Marin Olp M.D.   On: 10/08/2017 07:19   Dg Abd Portable 1v  Result Date: 10/08/2017 CLINICAL DATA:  Ileus. EXAM: PORTABLE ABDOMEN - 1 VIEW COMPARISON:  10/07/2017 FINDINGS: Percutaneous cholecystostomy tube unchanged. Continued evidence of several air-filled dilated small bowel loops in the central abdomen slightly decreased and caliber and number as the most dilated loop measures 4.8 cm. Air is present throughout the colon. No free peritoneal air remainder the exam is unchanged. IMPRESSION: Findings compatible with known ileus with mild interval improvement.  Cholecystostomy catheter unchanged. Electronically Signed   By: Marin Olp M.D.   On: 10/08/2017 07:21        Scheduled Meds: . aspirin  81 mg Oral Daily  . heparin  5,000 Units Subcutaneous Q8H  . hydrALAZINE  25 mg Oral Q8H  . insulin aspart  2-6 Units Subcutaneous Q4H  . senna  1 tablet Oral BID   Continuous Infusions: . famotidine (PEPCID) IV Stopped (10/09/17 1039)  . piperacillin-tazobactam (ZOSYN)  IV Stopped (10/09/17 1250)     LOS: 7 days    Time spent:     Marene Lenz, MD [facility] Pager [phone]  If 7PM-7AM, please contact night-coverage [email or web address] Password [email or web address] 10/09/2017, 1:14 PM

## 2017-10-09 NOTE — Progress Notes (Signed)
Referring Physician(s): Ingram,H  Supervising Physician: Markus Daft  Patient Status:  [facility] - In-pt  Chief Complaint:  cholecystitis  Subjective: Pt feeling better; NG out; tol diet ok; having BM's; has some soreness at GB drain site but not worsening   Allergies: Codeine  Medications: Prior to Admission medications   Medication Sig Start Date End Date Taking? Authorizing Provider  Alpha-Lipoic Acid 600 MG CAPS Take 600 mg by mouth daily.   Yes [provider]  benazepril (LOTENSIN) 20 MG tablet Take 1 tablet (20 mg total) by mouth daily. 07/08/17  Yes Copland, Gay Filler, MD  diclofenac (VOLTAREN) 75 MG EC tablet Take 1 tablet (75 mg total) by mouth 2 (two) times daily. Use as needed for joint pains 07/19/17  Yes Copland, Gay Filler, MD  glipiZIDE (GLUCOTROL) 10 MG tablet Take 1 tablet (10 mg total) by mouth 2 (two) times daily before a meal. 07/08/17  Yes Copland, Gay Filler, MD  hydrochlorothiazide (HYDRODIURIL) 25 MG tablet Take 0.5 tablets (12.5 mg total) by mouth daily. 07/08/17  Yes Copland, Gay Filler, MD  Melatonin 3 MG TABS Take 1 tablet by mouth at bedtime.    Yes [provider]  Multiple Vitamin (MULTIVITAMIN) tablet Take 1 tablet by mouth daily.   Yes [provider]  Turmeric 450 MG CAPS Take 1 tablet by mouth daily.    Yes [provider]  Acetylcarnitine HCl 500 MG CAPS Take 500 mg by mouth daily.    [provider]  Alcohol Swabs (ALCOHOL PREP) PADS Use prior to insulin dose 08/22/17   Copland, Jessica C, MD  glucose blood test strip Use as instructed 06/28/16   Copland, Gay Filler, MD  Insulin Pen Needle (PEN NEEDLES) 31G X 5 MM MISC 1 each by Does not apply route daily. 08/22/17   Copland, Gay Filler, MD  liraglutide (VICTOZA) 18 MG/3ML SOPN Inject 1.8 mg [place] daily 07/08/17   Copland, Gay Filler, MD  metFORMIN (GLUCOPHAGE) 1000 MG tablet Take 1 tablet (1,000 mg total) by mouth 2 (two) times daily with a meal. 07/08/17    Copland, Gay Filler, MD  niacin (NIASPAN) 500 MG CR tablet Take 1 tablet (500 mg total) by mouth at bedtime. Patient not taking: Reported on 10/02/2017 02/08/16   Copland, Gay Filler, MD  omeprazole (PRILOSEC) 40 MG capsule Take 1 capsule (40 mg total) by mouth daily. Patient taking differently: Take 40 mg by mouth daily as needed (heartburn).  07/08/17   Copland, Gay Filler, MD  tadalafil (CIALIS) 5 MG tablet Take 1 tablet (5 mg total) daily as needed by mouth for erectile dysfunction (and BPH). 07/24/17   Copland, Gay Filler, MD     Vital Signs: BP (!) 153/59 (BP Location: Right Arm)   Pulse 73   Temp 98.4 F (36.9 C) (Oral)   Resp (!) 22   Ht 5\' 9"  (1.753 m)   Wt 243 lb 6.2 oz (110.4 kg)   SpO2 98%   BMI 35.94 kg/m   Physical Exam GB drain intact, insertion site ok, mildly tender; output 60 cc light yellow fluid ; repeat bile cx pend; abd sl dist but soft  Imaging: Dg Abd 1 View  Result Date: 10/09/2017 CLINICAL DATA:  Follow-up ileus EXAM: ABDOMEN - 1 VIEW COMPARISON:  10/08/2017 FINDINGS: Scattered large and small bowel gas is noted. The degree of small bowel dilatation has nearly completely resolved in the interval from the prior exam. No free air is seen. A percutaneous cholecystostomy tube  is again noted. Degenerative changes of the lumbar spine are seen. IMPRESSION: Near complete resolution of small bowel dilatation when compare with the prior exam. Electronically Signed   By: Inez Catalina M.D.   On: 10/09/2017 07:16   US Renal  Result Date: 10/08/2017 CLINICAL DATA:  Renal failure EXAM: RENAL ULTRASOUND COMPARISON:  CT abdomen and pelvis October 04, 2017 FINDINGS: Right Kidney: Length: 13.5 cm. Echogenicity and renal cortical thickness are within normal limits. No perinephric fluid or hydronephrosis visualized. There is a cyst in the mid right kidney measuring 3.9 x 3.8 x 3.0 cm. No sonographically demonstrable calculus or ureterectasis. Left Kidney: Length: 12.8 cm. Echogenicity and  renal cortical thickness are within normal limits. No mass, perinephric fluid, or hydronephrosis visualized. No sonographically demonstrable calculus or ureterectasis. Bladder: Decompressed with Foley catheter and cannot be assessed. Moderate ascites noted. IMPRESSION: Cyst arising from the mid right kidney. Kidneys bilaterally otherwise appear unremarkable. There is moderate ascites. Electronically Signed   By: Lowella Grip III M.D.   On: 10/08/2017 18:57   Dg Chest Port 1 View  Result Date: 10/08/2017 CLINICAL DATA:  Respiratory failure. EXAM: PORTABLE CHEST 1 VIEW COMPARISON:  10/05/2017 FINDINGS: Enteric tube has been pulled back as tip is just below the expected region of the gastroesophageal junction and side-port over the distal esophagus. This could be advanced approximately 10 cm. Interval removal of endotracheal tube. Lungs are somewhat hypoinflated with minimal linear atelectasis/scarring over the left base. Cardiomediastinal silhouette and remainder the exam is unchanged. IMPRESSION: Subtle linear atelectasis/scarring left base. Enteric tube has been pulled pulled back as tip is now just below the expected region of the gastroesophageal junction as this could be advanced approximately 10 cm. These results will be called to the ordering clinician or representative by the Radiologist Assistant, and communication documented in the PACS or zVision Dashboard. Electronically Signed   By: Marin Olp M.D.   On: 10/08/2017 07:19   Dg Abd Portable 1v  Result Date: 10/08/2017 CLINICAL DATA:  Ileus. EXAM: PORTABLE ABDOMEN - 1 VIEW COMPARISON:  10/07/2017 FINDINGS: Percutaneous cholecystostomy tube unchanged. Continued evidence of several air-filled dilated small bowel loops in the central abdomen slightly decreased and caliber and number as the most dilated loop measures 4.8 cm. Air is present throughout the colon. No free peritoneal air remainder the exam is unchanged. IMPRESSION: Findings  compatible with known ileus with mild interval improvement. Cholecystostomy catheter unchanged. Electronically Signed   By: Marin Olp M.D.   On: 10/08/2017 07:21   Dg Abd Portable 1v  Result Date: 10/07/2017 CLINICAL DATA:  Vomiting.  History of cholecystostomy tube. EXAM: PORTABLE ABDOMEN - 1 VIEW COMPARISON:  CT abdomen pelvis-10/04/2017; ultrasound and CT guided cholecystostomy tube placement-10/04/2017 FINDINGS: There is moderate to marked gaseous distention of multiple loops of small bowel with index loop of small bowel within the left mid hemiabdomen measuring approximately 5.2 cm in diameter. This finding is associated with a conspicuous paucity of distal colonic gas. Nondiagnostic evaluation for pneumoperitoneum secondary to supine positioning and exclusion of the lower thorax. No definite pneumatosis or portal venous gas. Cholecystostomy tube overlies expected location of the gallbladder fossa. A catheter overlies the expected location of the urinary bladder. 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 Several phleboliths overlie the lower pelvis bilaterally. IMPRESSION: Findings worrisome for developing small bowel obstruction. Clinical correlation and continued attention on follow-up is advised. Electronically Signed   By:  Sandi Mariscal M.D.   On: 10/07/2017 10:10    Labs:  CBC: Recent Labs    10/05/17 0300 10/06/17 0719 10/07/17 0336 10/08/17 0257  WBC 7.5 8.0 11.9* 11.2*  HGB 10.2* 8.9* 9.8* 11.4*  HCT 31.0* 26.4* 29.4* 34.6*  PLT 167 208 297 385    COAGS: Recent Labs    10/04/17 0412  INR 1.04    BMP: Recent Labs    10/07/17 0336 10/07/17 1304 10/08/17 0257 10/09/17 0307  NA 139 140 141 137  K 3.4* 3.9 3.4* 3.6  CL 113* 114* 115* 110  CO2 18* 19* 20* 19*  GLUCOSE 141* 215* 168* 152*  BUN 33* 36* 40* 35*  CALCIUM 8.4* 8.4* 8.5* 8.2*  CREATININE  1.83*  1.77* 1.85* 1.88* 1.75*  GFRNONAA 36*  37* 36* 35* 38*  GFRAA 42*  43* 41* 40* 44*    LIVER FUNCTION TESTS: Recent Labs    10/05/17 0300 10/06/17 0216 10/07/17 1304 10/09/17 0307  BILITOT 3.4* 3.6* 2.0* 1.4*  AST 103* 83* 30 30  ALT 176* 144* 75* 49  ALKPHOS 143* 123 96 70  PROT 6.2* 6.1* 6.3* 5.6*  ALBUMIN 2.6* 2.3* 2.2* 1.9*    Assessment and Plan: Cholecystitis, s/p perc cholecystostomy 1/18; afebrile; t bili down to 1.4(2.0), creat 1.75(1.88), repeat bile cx pend; abd film today- near resolution of SB ileus; mod ascites noted on renal US; cont current tx; obtain f/u cholangiogram if drain output diminishes    Electronically Signed: D. Rowe Robert, PA-C 10/09/2017, 2:18 PM   I spent a total of 15 minutes at the the patient's bedside AND on the patient's hospital floor or unit, greater than 50% of which was counseling/coordinating care for gallbladder drain    Patient ID: [id number] Chapman, male   DOB: 1946-05-17, 72 y.o.   MRN: [id number]

## 2017-10-10 ENCOUNTER — Inpatient Hospital Stay (HOSPITAL_COMMUNITY): Payer: Medicare HMO

## 2017-10-10 LAB — CBC WITH DIFFERENTIAL/PLATELET
Basophils Absolute: 0 10*3/uL (ref 0.0–0.1)
Basophils Relative: 0 %
Eosinophils Absolute: 0.4 10*3/uL (ref 0.0–0.7)
Eosinophils Relative: 4 %
HCT: 28.1 % — ABNORMAL LOW (ref 39.0–52.0)
Hemoglobin: 9.4 g/dL — ABNORMAL LOW (ref 13.0–17.0)
Lymphocytes Relative: 16 %
Lymphs Abs: 1.6 10*3/uL (ref 0.7–4.0)
MCH: 28.5 pg (ref 26.0–34.0)
MCHC: 33.5 g/dL (ref 30.0–36.0)
MCV: 85.2 fL (ref 78.0–100.0)
Monocytes Absolute: 0.8 10*3/uL (ref 0.1–1.0)
Monocytes Relative: 9 %
Neutro Abs: 6.8 10*3/uL (ref 1.7–7.7)
Neutrophils Relative %: 71 %
Platelets: 413 10*3/uL — ABNORMAL HIGH (ref 150–400)
RBC: 3.3 MIL/uL — ABNORMAL LOW (ref 4.22–5.81)
RDW: 14.1 % (ref 11.5–15.5)
WBC: 9.7 10*3/uL (ref 4.0–10.5)

## 2017-10-10 LAB — GLUCOSE, CAPILLARY
Glucose-Capillary: 136 mg/dL — ABNORMAL HIGH (ref 65–99)
Glucose-Capillary: 153 mg/dL — ABNORMAL HIGH (ref 65–99)
Glucose-Capillary: 199 mg/dL — ABNORMAL HIGH (ref 65–99)
Glucose-Capillary: 148 mg/dL — ABNORMAL HIGH (ref 65–99)
Glucose-Capillary: 149 mg/dL — ABNORMAL HIGH (ref 65–99)
Glucose-Capillary: 148 mg/dL — ABNORMAL HIGH (ref 65–99)

## 2017-10-10 LAB — BASIC METABOLIC PANEL
Anion gap: 8 (ref 5–15)
BUN: 33 mg/dL — ABNORMAL HIGH (ref 6–20)
CO2: 20 mmol/L — ABNORMAL LOW (ref 22–32)
Calcium: 8.1 mg/dL — ABNORMAL LOW (ref 8.9–10.3)
Chloride: 109 mmol/L (ref 101–111)
Creatinine, Ser: 1.67 mg/dL — ABNORMAL HIGH (ref 0.61–1.24)
GFR calc Af Amer: 47 mL/min — ABNORMAL LOW (ref >60)
GFR calc non Af Amer: 40 mL/min — ABNORMAL LOW (ref >60)
Glucose, Bld: 151 mg/dL — ABNORMAL HIGH (ref 65–99)
Potassium: 3.2 mmol/L — ABNORMAL LOW (ref 3.5–5.1)
Sodium: 137 mmol/L (ref 135–145)

## 2017-10-10 MED ORDER — POTASSIUM CHLORIDE CRYS ER 20 MEQ PO TBCR
40.0000 meq | EXTENDED_RELEASE_TABLET | Freq: Once | ORAL | Status: AC
  Administered 2017-10-10: 40 meq via ORAL
  Filled 2017-10-10: qty 2

## 2017-10-10 MED ORDER — LABETALOL HCL 100 MG PO TABS
100.0000 mg | ORAL_TABLET | Freq: Two times a day (BID) | ORAL | Status: DC
  Administered 2017-10-10 – 2017-10-11 (×3): 100 mg via ORAL
  Filled 2017-10-10 (×3): qty 1

## 2017-10-10 MED ORDER — FAMOTIDINE 20 MG PO TABS
20.0000 mg | ORAL_TABLET | Freq: Two times a day (BID) | ORAL | Status: DC
  Administered 2017-10-10 – 2017-10-11 (×3): 20 mg via ORAL
  Filled 2017-10-10 (×3): qty 1

## 2017-10-10 NOTE — Progress Notes (Signed)
Nutrition Follow-up  DOCUMENTATION CODES:   Obesity unspecified  INTERVENTION:  - Continue to encourage PO intakes. - RD will monitor for nutrition-related needs.  NUTRITION DIAGNOSIS:   Inadequate oral intake related to early satiety, acute illness as evidenced by per patient/family report. -revised  GOAL:   Patient will meet greater than or equal to 90% of their needs -unmet at this time  MONITOR:   PO intake, Weight trends, Labs  ASSESSMENT:   72 year old obese gentleman with a history of hypertension, diabetes, obstructive sleep apnea, GERD.  Admitted on 1/16 with abdominal pain, nausea with vomiting, fever and chills for proximally 3 days.  His evaluation revealed a transaminitis, acute renal failure.  Initial x-rays suggested a small bowel obstruction and he was kept n.p.o.  Concern for either a viral hepatitis or cholangitis.  Unfortunately his course been complicated by some respiratory distress and agitation, delirium likely due to his evolving underlying illness.    Diet advanced from NPO to Soft yesterday at 7:30 AM. No intakes documented since that time. Pt denies abdominal pain or nausea with or without PO intakes. He states that he consumed ~75% of breakfast this AM which consisted of cottage cheese, peaches, pears, cream of potato soup, and iced tea. He has ordered lunch. He denies any nutrition-related questions or concerns at this time and states he knows he needs to monitor carbohydrate intake to help with blood sugar control.  Per Surgery PA note this AM, possible d/c home in 24-48 hours.   Medications reviewed; 20 mg oral Pepcid BID, sliding scale Novolog, 40 mEq oral KCl x1 dose today, 1 tablet Senokot BID. Labs reviewed; CBGs: 136 and 153 mg/dL, K: 3.2 mmol/L, BUN: 33 mg/dL, creatinine: 1.67 mg/dL, Ca: 8.1 mg/dL, GFR: 40 mL/min.    IVF: NS @ 50 mL/hr.    Diet Order:  DIET SOFT Room service appropriate? Yes; Fluid consistency: Thin  EDUCATION NEEDS:   No  education needs have been identified at this time  Skin:  Skin Assessment: Reviewed RN Assessment  Last BM:  1/23  Height:   Ht Readings from Last 1 Encounters:  10/04/17 5\' 9"  (1.753 m)    Weight:   Wt Readings from Last 1 Encounters:  10/08/17 243 lb 6.2 oz (110.4 kg)    Ideal Body Weight:  72.73 kg  BMI:  Body mass index is 35.94 kg/m.  Estimated Nutritional Needs:   Kcal:  2100-2320  Protein:  130-140 grams  Fluid:  >/= 2 L/day      Jarome Matin, MS, RD, LDN, [facility] Inpatient Clinical Dietitian Pager # [phone] After hours/weekend pager # [phone]

## 2017-10-10 NOTE — Progress Notes (Signed)
[facility] Progress Note     Subjective: CC: wants to go home Patient wants to go home to rest. Tolerating diet. Denies abdominal pain, n/v. Having BMs. UOP good. BP high.   Objective: Vital signs in last 24 hours: Temp:  [98 F (36.7 C)-98.5 F (36.9 C)] 98.1 F (36.7 C) (01/24 0755) Pulse Rate:  [62-83] 69 (01/24 0835) Resp:  [20-33] 22 (01/24 0835) BP: (144-215)/(49-103) 215/98 (01/24 0800) SpO2:  [94 %-100 %] 98 % (01/24 0835) Last BM Date: 10/09/17  Intake/Output from previous day: 01/23 0701 - 01/24 0700 In: 870 [P.O.:720; IV Piggyback:150] Out: 103 [Urine:4; Drains:95; Stool:4] Intake/Output this shift: No intake/output data recorded.  PE: Gen: Alert, NAD, pleasant Card: Regular rate and rhythm Pulm: Normal effort Abd: non-tender, mildly distended, bowel soundsnormoactive,drain in RUQ with serous output Skin: warm and dry, no rashes  Psych: A&Ox3    Lab Results:  Recent Labs    10/08/17 0257 10/10/17 0331  WBC 11.2* 9.7  HGB 11.4* 9.4*  HCT 34.6* 28.1*  PLT 385 413*   BMET Recent Labs    10/09/17 0307 10/10/17 0331  NA 137 137  K 3.6 3.2*  CL 110 109  CO2 19* 20*  GLUCOSE 152* 151*  BUN 35* 33*  CREATININE 1.75* 1.67*  CALCIUM 8.2* 8.1*   PT/INR No results for input(s): LABPROT, INR in the last 72 hours. CMP     Component Value Date/Time   NA 137 10/10/2017 0331   K 3.2 (L) 10/10/2017 0331   CL 109 10/10/2017 0331   CO2 20 (L) 10/10/2017 0331   GLUCOSE 151 (H) 10/10/2017 0331   BUN 33 (H) 10/10/2017 0331   CREATININE 1.67 (H) 10/10/2017 0331   CALCIUM 8.1 (L) 10/10/2017 0331   PROT 5.6 (L) 10/09/2017 0307   ALBUMIN 1.9 (L) 10/09/2017 0307   AST 30 10/09/2017 0307   ALT 49 10/09/2017 0307   ALKPHOS 70 10/09/2017 0307   BILITOT 1.4 (H) 10/09/2017 0307   GFRNONAA 40 (L) 10/10/2017 0331   GFRAA 47 (L) 10/10/2017 0331   Lipase     Component Value Date/Time   LIPASE 19 10/05/2017 0300        Studies/Results: Dg Abd 1 View  Result Date: 10/09/2017 CLINICAL DATA:  Follow-up ileus EXAM: ABDOMEN - 1 VIEW COMPARISON:  10/08/2017 FINDINGS: Scattered large and small bowel gas is noted. The degree of small bowel dilatation has nearly completely resolved in the interval from the prior exam. No free air is seen. A percutaneous cholecystostomy tube is again noted. Degenerative changes of the lumbar spine are seen. IMPRESSION: Near complete resolution of small bowel dilatation when compare with the prior exam. Electronically Signed   By: Inez Catalina M.D.   On: 10/09/2017 07:16   US Renal  Result Date: 10/08/2017 CLINICAL DATA:  Renal failure EXAM: RENAL ULTRASOUND COMPARISON:  CT abdomen and pelvis October 04, 2017 FINDINGS: Right Kidney: Length: 13.5 cm. Echogenicity and renal cortical thickness are within normal limits. No perinephric fluid or hydronephrosis visualized. There is a cyst in the mid right kidney measuring 3.9 x 3.8 x 3.0 cm. No sonographically demonstrable calculus or ureterectasis. Left Kidney: Length: 12.8 cm. Echogenicity and renal cortical thickness are within normal limits. No mass, perinephric fluid, or hydronephrosis visualized. No sonographically demonstrable calculus or ureterectasis. Bladder: Decompressed with Foley catheter and cannot be assessed. Moderate ascites noted. IMPRESSION: Cyst arising from the mid right kidney. Kidneys bilaterally otherwise appear unremarkable. There is moderate ascites. Electronically Signed  By: Lowella Grip III M.D.   On: 10/08/2017 18:57   US Abdomen Limited Ruq  Result Date: 10/10/2017 CLINICAL DATA:  Evaluate drain in gallbladder from cholecystostomy EXAM: ULTRASOUND ABDOMEN LIMITED RIGHT UPPER QUADRANT COMPARISON:  Abdomen films of 10/15/2017 and CT abdomen of 10/04/2017 FINDINGS: Gallbladder: The gallbladder is decompressed and echodensity within the lumen is consistent with cholecystostomy tube. Common bile duct: Diameter:  The common bile duct is normal measuring 5.4 mm. Liver: The liver is echogenic diffusely consistent with fatty infiltration. No focal hepatic abnormality is seen. Portal vein is patent on color Doppler imaging with normal direction of blood flow towards the liver. IMPRESSION: 1. Linear echodensity within the gallbladder is consistent with cholecystostomy tube with decompression of the gallbladder. 2. Echogenic liver parenchyma consistent with diffuse fatty infiltration. No focal abnormality. Electronically Signed   By: Ivar Drape M.D.   On: 10/10/2017 08:32    Anti-infectives: Anti-infectives (From admission, onward)   Start     Dose/Rate Route Frequency Ordered Stop   10/07/17 1200  vancomycin (VANCOCIN) IVPB 1000 mg/200 mL premix  Status:  Discontinued     1,000 mg 200 mL/hr over 60 Minutes Intravenous Every 24 hours 10/06/17 1114 10/07/17 1657   10/06/17 1200  vancomycin (VANCOCIN) 1,500 mg in sodium chloride 0.9 % 500 mL IVPB     1,500 mg 250 mL/hr over 120 Minutes Intravenous  Once 10/06/17 1114 10/06/17 1423   10/02/17 2200  piperacillin-tazobactam (ZOSYN) IVPB 3.375 g     3.375 g 12.5 mL/hr over 240 Minutes Intravenous Every 8 hours 10/02/17 1257     10/02/17 1400  vancomycin (VANCOCIN) IVPB 1000 mg/200 mL premix  Status:  Discontinued     1,000 mg 200 mL/hr over 60 Minutes Intravenous Every 24 hours 10/02/17 1257 10/02/17 1843   10/02/17 1300  piperacillin-tazobactam (ZOSYN) IVPB 3.375 g     3.375 g 100 mL/hr over 30 Minutes Intravenous  Once 10/02/17 1257 10/02/17 1352       Assessment/Plan HTN Acute respiratory failure with hypoxia- patient extubated 1/20, tolerating well AKI (acute kidney injury)- Cr 1.67 yesterday Atrial fibrillation - now in NSR  Sepsis/Septic shock  Acute cholecystitis s/p perc cholecystomy drainage 10/04/2017 - drain with95cc/24h -WBC -9.7; afebrile -T. Bili -1.4, trending down - continueZosyn, can d/c on PO augmentin 7-10 d -  mobilize as tolerated Ileus - resolved, replace K PO  DVV:OHYW diet VTE: SCDs, SQ heparin  ID: IV zosyn 1/16>>; IV vanc 1/20>1/21  Likely home in the next 24-48 hrs.  LOS: 8 days    Brigid Re , [facility] 10/10/2017, 10:22 AM Pager: [phone] Consults: [phone] Mon-Fri 7:00 am-4:30 pm Sat-Sun 7:00 am-11:30 am

## 2017-10-10 NOTE — Progress Notes (Signed)
PHARMACIST - PHYSICIAN COMMUNICATION  DR:   Tawanna Solo  CONCERNING: IV to Oral Route Change Policy  RECOMMENDATION: This patient is receiving Pepcid 20mg  q12h by the intravenous route.  Based on criteria approved by the Pharmacy and Therapeutics Committee, the intravenous medication(s) is/are being converted to the equivalent oral dose form(s).   DESCRIPTION: These criteria include:  The patient is eating (either orally or via tube) and/or has been taking other orally administered medications for a least 24 hours  The patient has no evidence of active gastrointestinal bleeding or impaired GI absorption (gastrectomy, short bowel, patient on TNA or NPO).  If you have questions about this conversion, please contact the Pharmacy Department  []   [phone] )  [facility] []   [phone] )  [facility] []   [phone] )  [facility] [x]   [phone] )  [facility] []   [phone] )  [place], PharmD, [place] Pager: [phone]  10/10/2017 7:07 AM

## 2017-10-10 NOTE — Progress Notes (Signed)
Notified by Janett Billow, RN (primary nurse) that pt is agitated and very impulsive, getting out of bed without calling even after instructed and shown how and when to call for assistance.  As Janett Billow finished up discussing situation, bed alarm went off.  I went in to check on patient, pt standing at side of bed, is A/Ox4, no distress, I asked if I could help him with anything, and he advised "ya, shut that thing off" (meaning bed alarm), I further advised him that I can reset the alarm but that it is on for his safety, due to being connected to an IV, EKG monitor, pulse ox, and BP cuff, that it is easy to get tangle up in the cords and tubes.  Pt verbalized understanding, but said that he was frustrated and irritated and that his expectation was that he was going to be transferred to a regular medical floor today so that he could move around freely.  After listening to him talk about his frustrations, I asked him if he wanted to walk around the unit and see something other than this four walls, pt agreed.  Pt walked two laps around unit with no difficulty, when walking he advised that his "doctor told me that I might could go to a regular room this afternoon, but then never came back to see me this afternoon".  I reviewed chart, did not see any transfer orders or note from doctor from this afternoon.  I advised the patient as such.  I noticed after walking patient appeared to be less agitated, conversation turned more friendly.  Pt understands to call before getting out of bed now and has verbalized to me that he will.  Does not want right bottom side rail up, even after explaining that the bed is a air mattress and it is for his safety, pt still didn't want it up.  Right bottom side rail left down per patient request, however, bed alarm left on and patient aware bed alarm is on and is ok with it.  Since this first situation, pt has not attempted to get OOB, and has called for assistance for both times he needed to  go to the bathroom.  [place] [facility] Care Coordinator / Rapid Response Nurse

## 2017-10-10 NOTE — Progress Notes (Signed)
PROGRESS NOTE    Timothy Chapman  FWY:[id number] DOB: June 18, 1946 DOA: 10/02/2017 PCP: [name], MD   Brief Narrative: Patient is a 72 year old male with past medical history of hypertension, diabetes who presented with complaints of abdominal pain and fever.  Found to have elevated LFTs.  He was diagnosed with cholangitis.  Also type of acute respiratory failure with hypoxia.  He had to be admitted to ICU after intubation.  He is currently status post cholecystostomy on 10/04/17.  Surgery is following.  Assessment & Plan:   Principal Problem:   Acute cholecystitis s/p perc cholecystomy drainage 10/04/2017 Active Problems:   HYPERTENSION, BENIGN ESSENTIAL, UNCONTROLLED   Abdominal pain   Sepsis ([place])   Acute respiratory failure with hypoxia (HCC)   AKI (acute kidney injury) ([place])   Septic shock (HCC)   Acute cholangitis   SBO (small bowel obstruction) (HCC)   ATN (acute tubular necrosis) (HCC)  Acute respiratory failure with hypoxia: Resolved.  Currently saturating fine on room air.  No respiratory issues currently. Intubated on 10/04/2016 extubated on 10/06/2017  Acute cholecystitis/cholangitis/sepsis shock:  s/p perc cholecystomy drainage 10/04/2017 Surgery on board. IR managing the cholecystostomy drain.  Drain is still yielding straw-colored fluid LFTs trended down. Continue empiric IV antibiotics. On  Zosyn.Patient is afebrile. Will change antibiotics to oral on discharge probably on Augmentin.  Uncontrolled  hypertension:  Started on oral hydralazine,labetelol.  Cannot tolerate amlodipine due to leg edema.  Continue labetelol and hydralazine as needed. He is on benazepril and hydrochlorothiazide at home.  It is on hold due to acute kidney injury.  AKI (acute kidney injury) ([place]) Baseline creatinine around 1.0 Probably ATN.  Kidney function improving  With gentle  IV fluids Renal ultrasound showed mid right kidney cyst.  Probably needs follow-up as an outpatient  with nephrology.  Ileus: Resolved Likely due to sepsis in the setting of cholangitis, surgery on board. Patient has been started on soft diet.  NG tube removed. Abdominal x-ray done on 10/09/17 showed complete resolution of small bowel dilatation .  diabetes mellitus type II: Continue sliding scale insulin.  Hypokalemia: Corrected.Will check tomorrow     DVT prophylaxis: Heparin [place] Code Status: Full Family Communication: None present at the bedside Disposition Plan: Home with home health in 1-2 days   Consultants: Surgery  Procedures: s/p perc cholecystomy drainage 10/04/2017  Antimicrobials:Zosyn since 10/02/17  Subjective: Patient seen and examined the bedside this morning.  Remains comfortable.  He was hypotensive this morning and was given as needed medications.  Currently denies any abdominal pain or chest pain, nausea or vomiting.   Objective: Vitals:   10/10/17 0600 10/10/17 0755 10/10/17 0800 10/10/17 0835  BP: (!) 213/81  (!) 215/98   Pulse: 64  83 69  Resp: (!) 21   (!) 22  Temp:  98.1 F (36.7 C)    TempSrc:  Oral    SpO2:   100% 98%  Weight:      Height:        Intake/Output Summary (Last 24 hours) at 10/10/2017 1126 Last data filed at 10/10/2017 0400 Gross per 24 hour  Intake 630 ml  Output 103 ml  Net 527 ml   Filed Weights   10/05/17 0600 10/06/17 0554 10/08/17 0500  Weight: 107.7 kg (237 lb 7 oz) 109.4 kg (241 lb 2.9 oz) 110.4 kg (243 lb 6.2 oz)    Examination:  General exam: Appears calm and comfortable ,Not in distress,OBESE Respiratory system: Bilateral equal air entry, normal vesicular  breath sounds, no wheezes or crackles  Cardiovascular system: S1 & S2 heard, RRR. No JVD, murmurs, rubs, gallops or clicks. No pedal edema. Gastrointestinal system: Abdomen is mildly distended, soft and nontender. No organomegaly or masses felt. Normal bowel sounds heard. Cholecystostomy drain with bag attached Central nervous system: Alert and oriented.  No focal neurological deficits. Extremities: 2-3 + PITTING  Edema ON bilateral lower extremities, no clubbing ,no cyanosis, distal peripheral pulses palpable. Skin: No cyanosis,No pallor,No Rash,No Ulcer Psychiatry: Judgement and insight appear normal. Mood & affect appropriate.    Data Reviewed: I have personally reviewed following labs and imaging studies  CBC: Recent Labs  Lab 10/05/17 0300 10/06/17 0719 10/07/17 0336 10/08/17 0257 10/10/17 0331  WBC 7.5 8.0 11.9* 11.2* 9.7  NEUTROABS  --  6.6 9.7*  --  6.8  HGB 10.2* 8.9* 9.8* 11.4* 9.4*  HCT 31.0* 26.4* 29.4* 34.6* 28.1*  MCV 85.9 84.9 85.2 85.9 85.2  PLT 167 208 297 385 696*   Basic Metabolic Panel: Recent Labs  Lab 10/06/17 0216 10/07/17 0336 10/07/17 1304 10/08/17 0257 10/09/17 0307 10/10/17 0331  NA 141 139 140 141 137 137  K 3.7 3.4* 3.9 3.4* 3.6 3.2*  CL 115* 113* 114* 115* 110 109  CO2 19* 18* 19* 20* 19* 20*  GLUCOSE 130* 141* 215* 168* 152* 151*  BUN 37* 33* 36* 40* 35* 33*  CREATININE 1.93* 1.83*  1.77* 1.85* 1.88* 1.75* 1.67*  CALCIUM 8.1* 8.4* 8.4* 8.5* 8.2* 8.1*  MG 1.7 1.9  --   --   --   --   PHOS 2.9 3.2  --   --   --   --    GFR: Estimated Creatinine Clearance: 51.1 mL/min (A) (by C-G formula based on SCr of 1.67 mg/dL (H)). Liver Function Tests: Recent Labs  Lab 10/04/17 0412 10/05/17 0300 10/06/17 0216 10/07/17 1304 10/09/17 0307  AST 53* 103* 83* 30 30  ALT 115* 176* 144* 75* 49  ALKPHOS 53 143* 123 96 70  BILITOT 1.1 3.4* 3.6* 2.0* 1.4*  PROT 7.0 6.2* 6.1* 6.3* 5.6*  ALBUMIN 3.4* 2.6* 2.3* 2.2* 1.9*   Recent Labs  Lab 10/05/17 0300  LIPASE 19   No results for input(s): AMMONIA in the last 168 hours. Coagulation Profile: Recent Labs  Lab 10/04/17 0412  INR 1.04   Cardiac Enzymes: Recent Labs  Lab 10/06/17 0216 10/07/17 0336 10/07/17 0745 10/07/17 1304 10/07/17 1838  CKTOTAL 115  --   --   --   --   CKMB 22.3*  --   --   --   --   TROPONINI  --  0.06* 0.05*  0.03* 0.03*   BNP (last 3 results) No results for input(s): PROBNP in the last 8760 hours. HbA1C: No results for input(s): HGBA1C in the last 72 hours. CBG: Recent Labs  Lab 10/09/17 1626 10/09/17 1944 10/09/17 2321 10/10/17 0337 10/10/17 0809  GLUCAP 179* 160* 164* 136* 153*   Lipid Profile: No results for input(s): CHOL, HDL, LDLCALC, TRIG, CHOLHDL, LDLDIRECT in the last 72 hours. Thyroid Function Tests: No results for input(s): TSH, T4TOTAL, FREET4, T3FREE, THYROIDAB in the last 72 hours. Anemia Panel: No results for input(s): VITAMINB12, FOLATE, FERRITIN, TIBC, IRON, RETICCTPCT in the last 72 hours. Sepsis Labs: Recent Labs  Lab 10/06/17 0216 10/06/17 1107 10/07/17 0336 10/08/17 0257  PROCALCITON  --  10.90 6.39 4.53  LATICACIDVEN 1.1  --   --   --     Recent Results (from the  past 240 hour(s))  Culture, blood (Routine X 2) w Reflex to ID Panel     Status: None   Collection Time: 10/02/17 10:40 PM  Result Value Ref Range Status   Specimen Description BLOOD LEFT HAND  Final   Special Requests   Final    BOTTLES DRAWN AEROBIC ONLY Blood Culture adequate volume   Culture   Final    NO GROWTH 5 DAYS Performed at [facility], [address], [place], [place] [address]    Report Status 10/08/2017 FINAL  Final  Culture, blood (Routine X 2) w Reflex to ID Panel     Status: None   Collection Time: 10/02/17 10:40 PM  Result Value Ref Range Status   Specimen Description BLOOD RIGHT HAND  Final   Special Requests   Final    BOTTLES DRAWN AEROBIC AND ANAEROBIC Blood Culture adequate volume   Culture   Final    NO GROWTH 5 DAYS Performed at [facility], [place] [address], [place], [place] [address]    Report Status 10/08/2017 FINAL  Final  Culture, body fluid-bottle     Status: Abnormal   Collection Time: 10/04/17 12:15 PM  Result Value Ref Range Status   Specimen Description FLUID BILE  Final   Special Requests NONE  Final   Gram Stain (A)  Final     GRAM VARIABLE ROD GRAM POSITIVE COCCI IN BOTH AEROBIC AND ANAEROBIC BOTTLES    Culture (A)  Final    MULTIPLE ORGANISMS PRESENT, NONE PREDOMINANT NO ANAEROBES ISOLATED Performed at [facility], [place] [address] [place], [place] [address]    Report Status 10/07/2017 FINAL  Final  Gram stain     Status: None   Collection Time: 10/04/17 12:15 PM  Result Value Ref Range Status   Specimen Description FLUID  Final   Special Requests NONE  Final   Gram Stain   Final    FEW WBC PRESENT,BOTH PMN AND MONONUCLEAR MODERATE GRAM VARIABLE ROD FEW GRAM POSITIVE COCCI Performed at [facility], [place] [address] [place], [place] [address]    Report Status 10/04/2017 FINAL  Final  Culture, Urine     Status: None   Collection Time: 10/05/17  9:27 AM  Result Value Ref Range Status   Specimen Description URINE, CLEAN CATCH  Final   Special Requests NONE  Final   Culture   Final    NO GROWTH Performed at [facility], [place] [address], [place], [place] [address]    Report Status 10/06/2017 FINAL  Final  MRSA PCR Screening     Status: None   Collection Time: 10/09/17 11:24 AM  Result Value Ref Range Status   MRSA by PCR NEGATIVE NEGATIVE Final    Comment:        The GeneXpert MRSA Assay (FDA approved for NASAL specimens only), is one component of a comprehensive MRSA colonization surveillance program. It is not intended to diagnose MRSA infection nor to guide or monitor treatment for MRSA infections.   Body fluid culture     Status: None (Preliminary result)   Collection Time: 10/09/17 11:53 AM  Result Value Ref Range Status   Specimen Description BILE  Final   Special Requests Normal  Final   Gram Stain   Final    ABUNDANT WBC PRESENT, PREDOMINANTLY PMN NO ORGANISMS SEEN    Culture   Final    CULTURE REINCUBATED FOR BETTER GROWTH Performed at [facility], [address], [place], [place] [address]    Report Status PENDING  Incomplete          Radiology Studies: Dg Abd 1 View  Result Date: 10/09/2017 CLINICAL DATA:  Follow-up ileus EXAM: ABDOMEN - 1 VIEW COMPARISON:  10/08/2017 FINDINGS: Scattered large and small bowel gas is noted. The degree of small bowel dilatation has nearly completely resolved in the interval from the prior exam. No free air is seen. A percutaneous cholecystostomy tube is again noted. Degenerative changes of the lumbar spine are seen. IMPRESSION: Near complete resolution of small bowel dilatation when compare with the prior exam. Electronically Signed   By: Inez Catalina M.D.   On: 10/09/2017 07:16   US Renal  Result Date: 10/08/2017 CLINICAL DATA:  Renal failure EXAM: RENAL ULTRASOUND COMPARISON:  CT abdomen and pelvis October 04, 2017 FINDINGS: Right Kidney: Length: 13.5 cm. Echogenicity and renal cortical thickness are within normal limits. No perinephric fluid or hydronephrosis visualized. There is a cyst in the mid right kidney measuring 3.9 x 3.8 x 3.0 cm. No sonographically demonstrable calculus or ureterectasis. Left Kidney: Length: 12.8 cm. Echogenicity and renal cortical thickness are within normal limits. No mass, perinephric fluid, or hydronephrosis visualized. No sonographically demonstrable calculus or ureterectasis. Bladder: Decompressed with Foley catheter and cannot be assessed. Moderate ascites noted. IMPRESSION: Cyst arising from the mid right kidney. Kidneys bilaterally otherwise appear unremarkable. There is moderate ascites. Electronically Signed   By: Lowella Grip III M.D.   On: 10/08/2017 18:57   US Abdomen Limited Ruq  Result Date: 10/10/2017 CLINICAL DATA:  Evaluate drain in gallbladder from cholecystostomy EXAM: ULTRASOUND ABDOMEN LIMITED RIGHT UPPER QUADRANT COMPARISON:  Abdomen films of 10/15/2017 and CT abdomen of 10/04/2017 FINDINGS: Gallbladder: The gallbladder is decompressed and echodensity within the lumen is consistent with cholecystostomy tube. Common bile duct:  Diameter: The common bile duct is normal measuring 5.4 mm. Liver: The liver is echogenic diffusely consistent with fatty infiltration. No focal hepatic abnormality is seen. Portal vein is patent on color Doppler imaging with normal direction of blood flow towards the liver. IMPRESSION: 1. Linear echodensity within the gallbladder is consistent with cholecystostomy tube with decompression of the gallbladder. 2. Echogenic liver parenchyma consistent with diffuse fatty infiltration. No focal abnormality. Electronically Signed   By: Ivar Drape M.D.   On: 10/10/2017 08:32        Scheduled Meds: . aspirin  81 mg Oral Daily  . famotidine  20 mg Oral BID  . heparin  5,000 Units Subcutaneous Q8H  . hydrALAZINE  25 mg Oral Q8H  . insulin aspart  2-6 Units Subcutaneous Q4H  . labetalol  100 mg Oral BID  . senna  1 tablet Oral BID   Continuous Infusions: . sodium chloride Stopped (10/10/17 0557)  . piperacillin-tazobactam (ZOSYN)  IV Stopped (10/10/17 0320)     LOS: 8 days    Time spent:     Marene Lenz, MD [facility] Pager [phone]  If 7PM-7AM, please contact night-coverage [email or web address] Password [facility] 10/10/2017, 11:26 AM

## 2017-10-10 NOTE — Progress Notes (Signed)
Pt has been agitated, getting OOB without assistance, urinating on floor. RN has tried to redirect Pt, in addition to explaining to the Pt to ask for assistance when getting OOB for safety. Pt is threatening to leave AMA if bed alarm is kept on. Bed Alarm is currently on, Pt still actively trying to get OOB. RN has informed Agricultural consultant. RN will continue to monitor.

## 2017-10-10 NOTE — Progress Notes (Signed)
Date: October 10, 2017 Mikaila Grunert, BSN, RN3, CCM [phone] Chart and notes review for patient progress and needs. Will follow for case management and discharge needs. No cm or discharge needs present at time of this review. Next review date: [id number] 

## 2017-10-10 NOTE — Progress Notes (Signed)
Referring Physician(s): Ingram,H  Supervising Physician: Arne Cleveland  Patient Status:  [facility] - In-pt  Chief Complaint:  cholecystitis  Subjective:  Pt doing well; no new c/o; anxious to go home  Allergies: Codeine  Medications: Prior to Admission medications   Medication Sig Start Date End Date Taking? Authorizing Provider  Alpha-Lipoic Acid 600 MG CAPS Take 600 mg by mouth daily.   Yes [provider]  benazepril (LOTENSIN) 20 MG tablet Take 1 tablet (20 mg total) by mouth daily. 07/08/17  Yes Copland, Gay Filler, MD  diclofenac (VOLTAREN) 75 MG EC tablet Take 1 tablet (75 mg total) by mouth 2 (two) times daily. Use as needed for joint pains 07/19/17  Yes Copland, Gay Filler, MD  glipiZIDE (GLUCOTROL) 10 MG tablet Take 1 tablet (10 mg total) by mouth 2 (two) times daily before a meal. 07/08/17  Yes Copland, Gay Filler, MD  hydrochlorothiazide (HYDRODIURIL) 25 MG tablet Take 0.5 tablets (12.5 mg total) by mouth daily. 07/08/17  Yes Copland, Gay Filler, MD  Melatonin 3 MG TABS Take 1 tablet by mouth at bedtime.    Yes [provider]  Multiple Vitamin (MULTIVITAMIN) tablet Take 1 tablet by mouth daily.   Yes [provider]  Turmeric 450 MG CAPS Take 1 tablet by mouth daily.    Yes [provider]  Acetylcarnitine HCl 500 MG CAPS Take 500 mg by mouth daily.    [provider]  Alcohol Swabs (ALCOHOL PREP) PADS Use prior to insulin dose 08/22/17   Copland, Jessica C, MD  glucose blood test strip Use as instructed 06/28/16   Copland, Gay Filler, MD  Insulin Pen Needle (PEN NEEDLES) 31G X 5 MM MISC 1 each by Does not apply route daily. 08/22/17   Copland, Gay Filler, MD  liraglutide (VICTOZA) 18 MG/3ML SOPN Inject 1.8 mg [place] daily 07/08/17   Copland, Gay Filler, MD  metFORMIN (GLUCOPHAGE) 1000 MG tablet Take 1 tablet (1,000 mg total) by mouth 2 (two) times daily with a meal. 07/08/17   Copland, Gay Filler, MD  niacin (NIASPAN) 500 MG CR tablet  Take 1 tablet (500 mg total) by mouth at bedtime. Patient not taking: Reported on 10/02/2017 02/08/16   Copland, Gay Filler, MD  omeprazole (PRILOSEC) 40 MG capsule Take 1 capsule (40 mg total) by mouth daily. Patient taking differently: Take 40 mg by mouth daily as needed (heartburn).  07/08/17   Copland, Gay Filler, MD  tadalafil (CIALIS) 5 MG tablet Take 1 tablet (5 mg total) daily as needed by mouth for erectile dysfunction (and BPH). 07/24/17   Copland, Gay Filler, MD     Vital Signs: BP (!) 171/86   Pulse 65   Temp 98.3 F (36.8 C) (Oral)   Resp (!) 25   Ht 5\' 9"  (1.753 m)   Wt 243 lb 6.2 oz (110.4 kg)   SpO2 100%   BMI 35.94 kg/m   Physical Exam GB drain intact, dressing dry, output 95 cc, repeat bile cx pend; abd sl dist, drain site NT  Imaging: Dg Abd 1 View  Result Date: 10/09/2017 CLINICAL DATA:  Follow-up ileus EXAM: ABDOMEN - 1 VIEW COMPARISON:  10/08/2017 FINDINGS: Scattered large and small bowel gas is noted. The degree of small bowel dilatation has nearly completely resolved in the interval from the prior exam. No free air is seen. A percutaneous cholecystostomy tube is again noted. Degenerative changes of the lumbar spine are seen. IMPRESSION: Near complete resolution of small bowel dilatation when  compare with the prior exam. Electronically Signed   By: Inez Catalina M.D.   On: 10/09/2017 07:16   US Renal  Result Date: 10/08/2017 CLINICAL DATA:  Renal failure EXAM: RENAL ULTRASOUND COMPARISON:  CT abdomen and pelvis October 04, 2017 FINDINGS: Right Kidney: Length: 13.5 cm. Echogenicity and renal cortical thickness are within normal limits. No perinephric fluid or hydronephrosis visualized. There is a cyst in the mid right kidney measuring 3.9 x 3.8 x 3.0 cm. No sonographically demonstrable calculus or ureterectasis. Left Kidney: Length: 12.8 cm. Echogenicity and renal cortical thickness are within normal limits. No mass, perinephric fluid, or hydronephrosis visualized. No  sonographically demonstrable calculus or ureterectasis. Bladder: Decompressed with Foley catheter and cannot be assessed. Moderate ascites noted. IMPRESSION: Cyst arising from the mid right kidney. Kidneys bilaterally otherwise appear unremarkable. There is moderate ascites. Electronically Signed   By: Lowella Grip III M.D.   On: 10/08/2017 18:57   Dg Chest Port 1 View  Result Date: 10/08/2017 CLINICAL DATA:  Respiratory failure. EXAM: PORTABLE CHEST 1 VIEW COMPARISON:  10/05/2017 FINDINGS: Enteric tube has been pulled back as tip is just below the expected region of the gastroesophageal junction and side-port over the distal esophagus. This could be advanced approximately 10 cm. Interval removal of endotracheal tube. Lungs are somewhat hypoinflated with minimal linear atelectasis/scarring over the left base. Cardiomediastinal silhouette and remainder the exam is unchanged. IMPRESSION: Subtle linear atelectasis/scarring left base. Enteric tube has been pulled pulled back as tip is now just below the expected region of the gastroesophageal junction as this could be advanced approximately 10 cm. These results will be called to the ordering clinician or representative by the Radiologist Assistant, and communication documented in the PACS or zVision Dashboard. Electronically Signed   By: Marin Olp M.D.   On: 10/08/2017 07:19   Dg Abd Portable 1v  Result Date: 10/08/2017 CLINICAL DATA:  Ileus. EXAM: PORTABLE ABDOMEN - 1 VIEW COMPARISON:  10/07/2017 FINDINGS: Percutaneous cholecystostomy tube unchanged. Continued evidence of several air-filled dilated small bowel loops in the central abdomen slightly decreased and caliber and number as the most dilated loop measures 4.8 cm. Air is present throughout the colon. No free peritoneal air remainder the exam is unchanged. IMPRESSION: Findings compatible with known ileus with mild interval improvement. Cholecystostomy catheter unchanged. Electronically Signed    By: Marin Olp M.D.   On: 10/08/2017 07:21   Dg Abd Portable 1v  Result Date: 10/07/2017 CLINICAL DATA:  Vomiting.  History of cholecystostomy tube. EXAM: PORTABLE ABDOMEN - 1 VIEW COMPARISON:  CT abdomen pelvis-10/04/2017; ultrasound and CT guided cholecystostomy tube placement-10/04/2017 FINDINGS: There is moderate to marked gaseous distention of multiple loops of small bowel with index loop of small bowel within the left mid hemiabdomen measuring approximately 5.2 cm in diameter. This finding is associated with a conspicuous paucity of distal colonic gas. Nondiagnostic evaluation for pneumoperitoneum secondary to supine positioning and exclusion of the lower thorax. No definite pneumatosis or portal venous gas. Cholecystostomy tube overlies expected location of the gallbladder fossa. A catheter overlies the expected location of the urinary bladder. 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 Several phleboliths overlie the lower pelvis bilaterally. IMPRESSION: Findings worrisome for developing small bowel obstruction. Clinical correlation and continued attention on follow-up is advised. Electronically Signed   By: Sandi Mariscal M.D.   On: 10/07/2017 10:10   US Abdomen Limited Ruq  Result Date: 10/10/2017 CLINICAL  DATA:  Evaluate drain in gallbladder from cholecystostomy EXAM: ULTRASOUND ABDOMEN LIMITED RIGHT UPPER QUADRANT COMPARISON:  Abdomen films of 10/15/2017 and CT abdomen of 10/04/2017 FINDINGS: Gallbladder: The gallbladder is decompressed and echodensity within the lumen is consistent with cholecystostomy tube. Common bile duct: Diameter: The common bile duct is normal measuring 5.4 mm. Liver: The liver is echogenic diffusely consistent with fatty infiltration. No focal hepatic abnormality is seen. Portal vein is patent on color Doppler imaging with normal direction of blood flow towards  the liver. IMPRESSION: 1. Linear echodensity within the gallbladder is consistent with cholecystostomy tube with decompression of the gallbladder. 2. Echogenic liver parenchyma consistent with diffuse fatty infiltration. No focal abnormality. Electronically Signed   By: Ivar Drape M.D.   On: 10/10/2017 08:32    Labs:  CBC: Recent Labs    10/06/17 0719 10/07/17 0336 10/08/17 0257 10/10/17 0331  WBC 8.0 11.9* 11.2* 9.7  HGB 8.9* 9.8* 11.4* 9.4*  HCT 26.4* 29.4* 34.6* 28.1*  PLT 208 297 385 413*    COAGS: Recent Labs    10/04/17 0412  INR 1.04    BMP: Recent Labs    10/07/17 1304 10/08/17 0257 10/09/17 0307 10/10/17 0331  NA 140 141 137 137  K 3.9 3.4* 3.6 3.2*  CL 114* 115* 110 109  CO2 19* 20* 19* 20*  GLUCOSE 215* 168* 152* 151*  BUN 36* 40* 35* 33*  CALCIUM 8.4* 8.5* 8.2* 8.1*  CREATININE 1.85* 1.88* 1.75* 1.67*  GFRNONAA 36* 35* 38* 40*  GFRAA 41* 40* 44* 47*    LIVER FUNCTION TESTS: Recent Labs    10/05/17 0300 10/06/17 0216 10/07/17 1304 10/09/17 0307  BILITOT 3.4* 3.6* 2.0* 1.4*  AST 103* 83* 30 30  ALT 176* 144* 75* 49  ALKPHOS 143* 123 96 70  PROT 6.2* 6.1* 6.3* 5.6*  ALBUMIN 2.6* 2.3* 2.2* 1.9*    Assessment and Plan: Cholecystitis, s/p perc cholecystostomy 1/18; afebrile;WBC nl; hgb 9.4, K 3.2- replace; creat 1.67(1.75); repeat bile cx pend; abd [place] today confirms GB drain in appropriate position; drain will need to remain in place for at least 4-6 weeks unless cholecystectomy done in interim; once pt d/c'd home recommend once daily flush of drain with 5-10 sterile NS, recording of output and dressing changes as needed. He will be set up for f/u cholangiogram in IR clinic in 4 weeks; he can call IR at [phone] or [phone] with any drain questions.     Electronically Signed: D. Rowe Robert, PA-C 10/10/2017, 3:16 PM   I spent a total of 15 minutes at the the patient's bedside AND on the patient's hospital floor or unit, greater than  50% of which was counseling/coordinating care for gallbladder drain    Patient ID: Timothy Chapman, male   DOB: 03-Mar-1946, 72 y.o.   MRN: [id number]

## 2017-10-11 ENCOUNTER — Encounter: Payer: Self-pay | Admitting: Family Medicine

## 2017-10-11 ENCOUNTER — Telehealth: Payer: Self-pay | Admitting: Family Medicine

## 2017-10-11 LAB — GLUCOSE, CAPILLARY
Glucose-Capillary: 172 mg/dL — ABNORMAL HIGH (ref 65–99)
Glucose-Capillary: 180 mg/dL — ABNORMAL HIGH (ref 65–99)

## 2017-10-11 LAB — BASIC METABOLIC PANEL
Anion gap: 8 (ref 5–15)
BUN: 29 mg/dL — AB (ref 6–20)
CALCIUM: 8.4 mg/dL — AB (ref 8.9–10.3)
CHLORIDE: 108 mmol/L (ref 101–111)
CO2: 21 mmol/L — AB (ref 22–32)
CREATININE: 1.71 mg/dL — AB (ref 0.61–1.24)
GFR calc non Af Amer: 39 mL/min — ABNORMAL LOW (ref >60)
GFR, EST AFRICAN AMERICAN: 45 mL/min — AB (ref >60)
Glucose, Bld: 179 mg/dL — ABNORMAL HIGH (ref 65–99)
Potassium: 3.4 mmol/L — ABNORMAL LOW (ref 3.5–5.1)
Sodium: 137 mmol/L (ref 135–145)

## 2017-10-11 MED ORDER — HYDRALAZINE HCL 50 MG PO TABS: 50.0000 mg | ORAL_TABLET | Freq: Three times a day (TID) | ORAL | Status: DC

## 2017-10-11 MED ORDER — POTASSIUM CHLORIDE CRYS ER 20 MEQ PO TBCR
40.0000 meq | EXTENDED_RELEASE_TABLET | Freq: Once | ORAL | Status: AC
  Administered 2017-10-11: 40 meq via ORAL
  Filled 2017-10-11: qty 2

## 2017-10-11 MED ORDER — FAMOTIDINE 20 MG PO TABS: 20.0000 mg | ORAL_TABLET | Freq: Two times a day (BID) | ORAL | 0 refills | Status: DC

## 2017-10-11 MED ORDER — HYDRALAZINE HCL 50 MG PO TABS: 50.0000 mg | ORAL_TABLET | Freq: Three times a day (TID) | ORAL | 0 refills | Status: DC

## 2017-10-11 MED ORDER — ASPIRIN 81 MG PO CHEW: 81.0000 mg | CHEWABLE_TABLET | Freq: Every day | ORAL | 0 refills | Status: DC

## 2017-10-11 MED ORDER — LABETALOL HCL 100 MG PO TABS: 100.0000 mg | ORAL_TABLET | Freq: Two times a day (BID) | ORAL | 0 refills | Status: DC

## 2017-10-11 MED ORDER — AMOXICILLIN-POT CLAVULANATE 875-125 MG PO TABS: 1.0000 | ORAL_TABLET | Freq: Two times a day (BID) | ORAL | 0 refills | Status: AC

## 2017-10-11 NOTE — Discharge Summary (Signed)
Physician Discharge Summary  Timothy Chapman ONG:[id number] DOB: 08-08-1946 DOA: 10/02/2017  PCP: [name], MD  Admit date: 10/02/2017 Discharge date: 10/11/2017  Admitted From: Home Disposition: Home   Home Health:Yes  Discharge Condition: Stable  CODE STATUS:Full Diet recommendation: Heart Healthy  Brief/Interim Summary: Patient is a 72 year old male with past medical history of hypertension, diabetes who presented with complaints of abdominal pain and fever.  Found to have elevated LFTs.  He was diagnosed with cholangitis.  He also sustained  acute respiratory failure with hypoxia.  He had to be admitted to ICU after intubation.  He is currently status post cholecystostomy on 10/04/17.  Surgery was following.IR was following.  Patient's overall condition is stabilized and patient was transferred to hospital service. Patient has been discharged home with home health today.  Following problems were addressed during his hospitalization:  Acute respiratory failure with hypoxia: Resolved.  Currently saturating fine on room air.  No respiratory issues currently. Intubated on 10/04/2016 extubated on 10/06/2017  Acute cholecystitis/cholangitis/sepsis shock: s/p perc cholecystomy drainage 10/04/2017 Surgery was on board. IR was managing the cholecystostomy drain.  Drain is still yielding straw-colored fluid LFTs trended down. Treated wit  empiric IV antibiotics.Was On  Zosyn.Patient is afebrile. Changed antibiotics to oral on discharge , on Augmentin. He should follow-up with IR as an outpatient.  Appropriate recommendations have been listed.  Uncontrolled  hypertension:  Started on oral hydralazine,labetelol.  Cannot tolerate amlodipine due to leg edema.  He is on benazepril and hydrochlorothiazide at home.  It is on hold due to acute kidney injury. His blood pressures will be monitored as an outpatient  AKI (acute kidney injury) ([place]) Baseline creatinine around  1.0 Probably ATN.  Kidney function improving  With gentle  IV fluids Renal ultrasound showed mid right kidney cyst.  He  needs follow-up as an outpatient with nephrology.  Ileus: Resolved Likely due to sepsis in the setting of cholangitis, surgery was on board. Patient has been started on   diet.  NG tube removed. Abdominal x-ray done on 10/09/17 showed complete resolution of small bowel dilatation .  diabetes mellitus type II: He should follow-up with his PCP for monitoring of his diabetes.  We will continue home regimen on discharge for now.  Hypokalemia: Corrected       Discharge Diagnoses:  Principal Problem:   Acute cholecystitis s/p perc cholecystomy drainage 10/04/2017 Active Problems:   HYPERTENSION, BENIGN ESSENTIAL, UNCONTROLLED   Abdominal pain   Sepsis ([place])   Acute respiratory failure with hypoxia ([facility])   AKI (acute kidney injury) ([facility])   Septic shock ([facility])   Acute cholangitis   SBO (small bowel obstruction) ([facility])   ATN (acute tubular necrosis) ([facility])    Discharge Instructions  Discharge Instructions    Diet - low sodium heart healthy   Complete by:  As directed    Discharge instructions   Complete by:  As directed    1) Please follow-up with your PCP in a week . 2) Check CBC and BMP test in a week. 3) Follow up with nephrology  as an outpatient.  Name and number of the provider has been attached. 4) Follow up with general surgery as an outpatient. 5)Drain will need to remain in place for at least 4-6 weeks unless cholecystectomy done in interim; once d/c'd home recommend once daily flush of drain with 5-10 sterile NS, recording of output and dressing changes as needed. Needs set up for f/u cholangiogram in IR clinic in 4 weeks;  please call IR at [phone] or [phone] with any drain questions. 6) Monitor your blood pressure.   Face-to-face encounter (required for Medicare/Medicaid patients)   Complete by:  As directed    I Alaze Garverick BK  certify that this patient is under my care and that I, or a nurse practitioner or physician's assistant working with me, had a face-to-face encounter that meets the physician face-to-face encounter requirements with this patient on 10/11/2017. The encounter with the patient was in whole, or in part for the following medical condition(s) which is the primary reason for home health care for balance issues.   The encounter with the patient was in whole, or in part, for the following medical condition, which is the primary reason for home health care:  PT   I certify that, based on my findings, the following services are medically necessary home health services:  Physical therapy   Reason for Medically Necessary Home Health Services:  Therapy- Personnel officer, Training and development officer and Stair Training   My clinical findings support the need for the above services:  Unsafe ambulation due to balance issues   Further, I certify that my clinical findings support that this patient is homebound due to:  Unsafe ambulation due to balance issues   Home Health   Complete by:  As directed    To provide the following care/treatments:  PT   Increase activity slowly   Complete by:  As directed      Allergies as of 10/11/2017      Reactions   Codeine       Medication List    STOP taking these medications   benazepril 20 MG tablet Commonly known as:  LOTENSIN   hydrochlorothiazide 25 MG tablet Commonly known as:  HYDRODIURIL     TAKE these medications   Acetylcarnitine HCl 500 MG Caps Take 500 mg by mouth daily.   Alcohol Prep Pads Use prior to insulin dose   Alpha-Lipoic Acid 600 MG Caps Take 600 mg by mouth daily.   amoxicillin-clavulanate 875-125 MG tablet Commonly known as:  AUGMENTIN Take 1 tablet by mouth 2 (two) times daily for 7 days.   aspirin 81 MG chewable tablet Chew 1 tablet (81 mg total) by mouth daily. Start taking on:  10/12/2017   diclofenac 75 MG EC tablet Commonly known as:   VOLTAREN Take 1 tablet (75 mg total) by mouth 2 (two) times daily. Use as needed for joint pains   famotidine 20 MG tablet Commonly known as:  PEPCID Take 1 tablet (20 mg total) by mouth 2 (two) times daily.   glipiZIDE 10 MG tablet Commonly known as:  GLUCOTROL Take 1 tablet (10 mg total) by mouth 2 (two) times daily before a meal.   glucose blood test strip Use as instructed   hydrALAZINE 50 MG tablet Commonly known as:  APRESOLINE Take 1 tablet (50 mg total) by mouth every 8 (eight) hours.   labetalol 100 MG tablet Commonly known as:  NORMODYNE Take 1 tablet (100 mg total) by mouth 2 (two) times daily.   liraglutide 18 MG/3ML Sopn Commonly known as:  VICTOZA Inject 1.8 mg  daily   Melatonin 3 MG Tabs Take 1 tablet by mouth at bedtime.   metFORMIN 1000 MG tablet Commonly known as:  GLUCOPHAGE Take 1 tablet (1,000 mg total) by mouth 2 (two) times daily with a meal.   multivitamin tablet Take 1 tablet by mouth daily.   niacin 500 MG CR tablet Commonly known as:  NIASPAN Take 1 tablet (500 mg total) by mouth at bedtime.   omeprazole 40 MG capsule Commonly known as:  PRILOSEC Take 1 capsule (40 mg total) by mouth daily. What changed:    when to take this  reasons to take this   Pen Needles 31G X 5 MM Misc 1 each by Does not apply route daily.   tadalafil 5 MG tablet Commonly known as:  CIALIS Take 1 tablet (5 mg total) daily as needed by mouth for erectile dysfunction (and BPH).   Turmeric 450 MG Caps Take 1 tablet by mouth daily.      Follow-up Information    Fanny Skates, MD Follow up.   Specialty:  General Surgery Why:  Call and arrange a follow up appointment for 3-4 weeks from discharge. Contact information: [place] [address] [phone]        Copland, Gay Filler, MD. Schedule an appointment as soon as possible for a visit in 1 week(s).   Specialty:  Family Medicine Contact information: [address] [place] [address] [phone]        Edrick Oh, MD. Schedule an appointment as soon as possible for a visit in 1 week(s).   Specialty:  Nephrology Contact information: [place] [address] [phone]          Allergies  Allergen Reactions  . Codeine     Consultations:  General surgery, radiology, PCCM   Procedures/Studies: Ct Abdomen Pelvis Wo Contrast  Result Date: 10/04/2017 CLINICAL DATA:  Fever, chills, abdominal pain, nausea and vomiting. Elevated liver function studies. EXAM: CT ABDOMEN AND PELVIS WITHOUT CONTRAST TECHNIQUE: Multidetector CT imaging of the abdomen and pelvis was performed following the standard protocol without IV contrast. COMPARISON:  MRI abdomen 10/03/2017 FINDINGS: Lower chest: Small to moderate-sized bilateral pleural effusions with overlying atelectasis. Mild cardiac enlargement. Coronary artery calcifications. There is an NG tube coursing down the esophagus and into the stomach. Hepatobiliary: The gallbladder is distended and contains gallstones. Surrounding inflammatory changes suspicious for acute cholecystitis. Mild common bile duct dilatation with maximum diameter of 8.5 mm. No obvious common bile duct stones. Pancreas: No mass, inflammation or ductal dilatation. Duodenum diverticulum noted near the pancreatic head. Spleen: Normal size.  No focal lesions. Adrenals/Urinary Tract: The adrenal glands and kidneys are unremarkable. Right renal cyst is noted. The bladder contains a Foley catheter. Stomach/Bowel: The stomach, duodenum, small bowel and colon are grossly normal without oral contrast. 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 There is an NG tube in the stomach. Vascular/Lymphatic: Scattered aortic calcifications but no aneurysm. Small scattered mesenteric and retroperitoneal lymph nodes but no mass or adenopathy. Reproductive: The prostate gland contains brachytherapy seeds. There is a Foley catheter in the bladder.  Seminal vesicles appear normal. Other: Moderate free pelvic fluid and mild free abdominal fluid. This measures as simple fluid. No pelvic mass or adenopathy. No inguinal mass or hernia. Musculoskeletal: Advanced degenerative changes involving the spine. Severe disc disease at L2-3. No acute bony findings. IMPRESSION: 1. CT findings most suggestive of acute cholecystitis. Right upper quadrant inflammatory process could also be due to duodenitis or pancreatitis but I think both of these are much less likely. 2. Bilateral pleural effusions and overlying atelectasis. 3. Small amount of free abdominal fluid and moderate amount of free pelvic fluid. Electronically Signed   By: Marijo Sanes M.D.   On: 10/04/2017 12:41   Dg Abd 1 View  Result Date: 10/09/2017 CLINICAL DATA:  Follow-up ileus EXAM: ABDOMEN - 1 VIEW COMPARISON:  10/08/2017 FINDINGS: Scattered large and small bowel gas is noted. The degree of small bowel dilatation has nearly completely resolved in the interval from the prior exam. No free air is seen. A percutaneous cholecystostomy tube is again noted. Degenerative changes of the lumbar spine are seen. IMPRESSION: Near complete resolution of small bowel dilatation when compare with the prior exam. Electronically Signed   By: Inez Catalina M.D.   On: 10/09/2017 07:16   Nm Hepatobiliary Liver Func  Result Date: 10/03/2017 CLINICAL DATA:  Clinical concern for cystic duct obstruction. EXAM: NUCLEAR MEDICINE HEPATOBILIARY IMAGING TECHNIQUE: Sequential images of the abdomen were obtained out to 60 minutes following intravenous administration of radiopharmaceutical. RADIOPHARMACEUTICALS:  5.5 millicurie mCi EU-23N  Choletec IV COMPARISON:  Ultrasound exam from yesterday. MRI from earlier today. FINDINGS: The patient was unable to complete the exam and terminated the study after 25 minutes of imaging. During this 25 minutes, there is brisk uptake of radiotracer by the hepatic parenchyma with rapid normal  clearance of blood pool activity. Common duct activity is visible by 15 minutes and gut activity is visible by 20 minutes. No gallbladder filling observed during this initial 25 minutes of imaging. IMPRESSION: 1. Patent common bile duct. 2. Cystic duct patency cannot be assessed on this study as patient terminated the exam before it was complete. Electronically Signed   By: Misty Stanley M.D.   On: 10/03/2017 19:36   US Renal  Result Date: 10/08/2017 CLINICAL DATA:  Renal failure EXAM: RENAL ULTRASOUND COMPARISON:  CT abdomen and pelvis October 04, 2017 FINDINGS: Right Kidney: Length: 13.5 cm. Echogenicity and renal cortical thickness are within normal limits. No perinephric fluid or hydronephrosis visualized. There is a cyst in the mid right kidney measuring 3.9 x 3.8 x 3.0 cm. No sonographically demonstrable calculus or ureterectasis. Left Kidney: Length: 12.8 cm. Echogenicity and renal cortical thickness are within normal limits. No mass, perinephric fluid, or hydronephrosis visualized. No sonographically demonstrable calculus or ureterectasis. Bladder: Decompressed with Foley catheter and cannot be assessed. Moderate ascites noted. IMPRESSION: Cyst arising from the mid right kidney. Kidneys bilaterally otherwise appear unremarkable. There is moderate ascites. Electronically Signed   By: Lowella Grip III M.D.   On: 10/08/2017 18:57   Mr Abdomen Mrcp Wo Contrast  Result Date: 10/03/2017 CLINICAL DATA:  Jaundice with abdominal pain. Gallstones noted on recent ultrasound exam. EXAM: MRI ABDOMEN WITHOUT CONTRAST  (INCLUDING MRCP) TECHNIQUE: Multiplanar multisequence MR imaging of the abdomen was performed. Heavily T2-weighted images of the biliary and pancreatic ducts were obtained, and three-dimensional MRCP images were rendered by post processing. COMPARISON:  Ultrasound exam 10/02/2017 FINDINGS: Study is markedly motion degraded due to patient inability to reproducibly breath hold. Lower chest: Heart  size upper normal. Small bilateral pleural effusions. Hepatobiliary: Liver measures 24.3 cm craniocaudal length, enlarged. Diffuse fatty deposition noted within the liver parenchyma. Tiny stones are seen dependently in the lumen of the gallbladder (images 30 and 31 of series 13). There is some debris or possible stones in the fundus of the gallbladder. Trace peri cholecystic fluid evident. No substantial intrahepatic biliary duct dilatation. The common duct and common bile ducts are not well evaluated due to the substantial motion artifact. Extrahepatic common duct measures 7 mm diameter where visualized on axial single shot T2 weighted imaging. Common bile duct in the head of the pancreas is 7 mm diameter although incompletely visualized. There is some central flow artifact within the common bile duct  on the axial T2 imaging. Pancreas: No dilatation of the main duct. 17 x 11 x 20 mm cystic lesion identified in the posterior pancreatic head. Susceptibility artifact in the head of the pancreas on multiple sequences may be related to gas and duodenal diverticuli or parenchymal calcification in the pancreatic head. Spleen:  No splenomegaly. No focal mass lesion. Adrenals/Urinary Tract: No adrenal nodule or mass. 4.1 cm simple appearing cyst identified posterior right kidney. No overtly suspicious lesion identified in either kidney on this noncontrast exam. Stomach/Bowel: Stomach is nondistended. Duodenum is normally positioned as is the ligament of Treitz. No small bowel or colonic dilatation within the visualized abdomen. Vascular/Lymphatic: No abdominal aortic aneurysm. Signal void within the portal vein suggests patency. No abdominal lymphadenopathy. Other:  Trace fluid is noted at the inferior liver. Musculoskeletal: No abnormal marrow signal within the visualized bony anatomy. IMPRESSION: 1. Markedly motion degraded study due to patient inability to reproducibly breath hold. Common duct and common bile duct are  incompletely evaluated. Where visualized, the extrahepatic biliary tree measures up to 7 mm diameter. Portions of the common bile duct entering and tracking through the head of pancreas are not visualized. 2. Tiny gallstones noted in the dependent gallbladder lumen. There is trace pericholecystic fluid. Debris is noted in the gallbladder fundus. 3. No dilatation of the main pancreatic duct with a 7 x 11 x 20 mm cystic lesion in the posterior pancreatic head showing no definite communication to the main duct but motion artifact limits assessment. Repeat MRI in 6 months recommended to re-evaluate. This recommendation follows ACR consensus guidelines: Management of Incidental Pancreatic Cysts: A White Paper of the ACR Incidental Findings Committee. [place] 8185;63:149-702. 4. Hepatic steatosis with hepatomegaly. 5. Small bilateral pleural effusions. Electronically Signed   By: Misty Stanley M.D.   On: 10/03/2017 16:04   Dg Chest Port 1 View  Result Date: 10/08/2017 CLINICAL DATA:  Respiratory failure. EXAM: PORTABLE CHEST 1 VIEW COMPARISON:  10/05/2017 FINDINGS: Enteric tube has been pulled back as tip is just below the expected region of the gastroesophageal junction and side-port over the distal esophagus. This could be advanced approximately 10 cm. Interval removal of endotracheal tube. Lungs are somewhat hypoinflated with minimal linear atelectasis/scarring over the left base. Cardiomediastinal silhouette and remainder the exam is unchanged. IMPRESSION: Subtle linear atelectasis/scarring left base. Enteric tube has been pulled pulled back as tip is now just below the expected region of the gastroesophageal junction as this could be advanced approximately 10 cm. These results will be called to the ordering clinician or representative by the Radiologist Assistant, and communication documented in the PACS or zVision Dashboard. Electronically Signed   By: Marin Olp M.D.   On: 10/08/2017 07:19   Dg  Chest Port 1 View  Result Date: 10/05/2017 CLINICAL DATA:  Hypoxia EXAM: PORTABLE CHEST 1 VIEW COMPARISON:  October 04, 2017 FINDINGS: Endotracheal tube tip is 3.2 cm above the carina. Nasogastric tube tip and side port are below the diaphragm. No pneumothorax. There is atelectatic change in the left base with small left pleural effusion. Lungs elsewhere are clear. Heart is mildly enlarged with pulmonary vascularity within normal limits. No adenopathy. No bone lesions. IMPRESSION: Tube positions as described without pneumothorax. Left base atelectasis with small left pleural effusion. Lungs elsewhere clear. Stable cardiac silhouette. Electronically Signed   By: Lowella Grip III M.D.   On: 10/05/2017 07:15   Dg Chest Port 1 View  Result Date: 10/04/2017 CLINICAL DATA:  Endotracheal and  orogastric tube placement. EXAM: PORTABLE CHEST 1 VIEW COMPARISON:  Radiograph October 02, 2017. FINDINGS: Stable cardiomediastinal silhouette. Endotracheal tube is seen projected over tracheal air shadow with distal tip 3 cm above the carina. Orogastric tube is seen passing through esophagus into stomach. Distal tip is not clearly visualized. Mild central pulmonary vascular congestion is noted. Mild bibasilar subsegmental atelectasis is noted. No pneumothorax is noted. Bony thorax is unremarkable. IMPRESSION: Endotracheal and orogastric tubes are in grossly good position. Mild central pulmonary vascular congestion is noted. Mild bibasilar subsegmental atelectasis is noted. Electronically Signed   By: Marijo Conception, M.D.   On: 10/04/2017 09:55   Dg Abdomen Acute W/chest  Result Date: 10/02/2017 CLINICAL DATA:  72 year old male with abdominal pain, distention, vomiting, diarrhea. Symptoms for 3 days. EXAM: DG ABDOMEN ACUTE W/ 1V CHEST COMPARISON:  Chest radiograph 04/11/2007. Lumbar radiographs 02/11/2014. FINDINGS: Lung volumes are stable and within normal limits. Normal cardiac size and mediastinal contours.  Visualized tracheal air column is within normal limits. No pneumothorax or pneumoperitoneum. No abnormal pulmonary opacity. Multiple upper limits of normal to abnormally dilated gas containing small bowel loops in the mid abdomen, up to 4.5-5 centimeters diameter. Paucity of large bowel gas. Other abdominal and pelvic visceral contours appear normal. There are surgical clips about the pubic symphysis which are new since 2,015. Degenerative changes in the spine. No acute osseous abnormality identified. IMPRESSION: 1. Bowel-gas pattern suggesting a small bowel obstruction or small bowel ileus. No free air. 2.  No acute cardiopulmonary abnormality. Electronically Signed   By: Genevie Ann M.D.   On: 10/02/2017 10:18   Dg Abd Portable 1v  Result Date: 10/08/2017 CLINICAL DATA:  Ileus. EXAM: PORTABLE ABDOMEN - 1 VIEW COMPARISON:  10/07/2017 FINDINGS: Percutaneous cholecystostomy tube unchanged. Continued evidence of several air-filled dilated small bowel loops in the central abdomen slightly decreased and caliber and number as the most dilated loop measures 4.8 cm. Air is present throughout the colon. No free peritoneal air remainder the exam is unchanged. IMPRESSION: Findings compatible with known ileus with mild interval improvement. Cholecystostomy catheter unchanged. Electronically Signed   By: Marin Olp M.D.   On: 10/08/2017 07:21   Dg Abd Portable 1v  Result Date: 10/07/2017 CLINICAL DATA:  Vomiting.  History of cholecystostomy tube. EXAM: PORTABLE ABDOMEN - 1 VIEW COMPARISON:  CT abdomen pelvis-10/04/2017; ultrasound and CT guided cholecystostomy tube placement-10/04/2017 FINDINGS: There is moderate to marked gaseous distention of multiple loops of small bowel with index loop of small bowel within the left mid hemiabdomen measuring approximately 5.2 cm in diameter. This finding is associated with a conspicuous paucity of distal colonic gas. Nondiagnostic evaluation for pneumoperitoneum secondary to supine  positioning and exclusion of the lower thorax. No definite pneumatosis or portal venous gas. Cholecystostomy tube overlies expected location of the gallbladder fossa. A catheter overlies the expected location of the urinary bladder. 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 Several phleboliths overlie the lower pelvis bilaterally. IMPRESSION: Findings worrisome for developing small bowel obstruction. Clinical correlation and continued attention on follow-up is advised. Electronically Signed   By: Sandi Mariscal M.D.   On: 10/07/2017 10:10   Dg Abd Portable 1v  Result Date: 10/04/2017 CLINICAL DATA:  Orogastric tube placement. EXAM: PORTABLE ABDOMEN - 1 VIEW COMPARISON:  Radiographs of October 02, 2017. FINDINGS: No definite abnormal bowel dilatation is seen currently. Air-filled colon is noted. Distal tip of orogastric tube is  seen in proximal stomach. IMPRESSION: Distal tip of orogastric tube seen in proximal stomach. Electronically Signed   By: Marijo Conception, M.D.   On: 10/04/2017 09:56   Ct Perc Cholecystostomy  Result Date: 10/04/2017 INDICATION: 72 year old with abdominal pain, sepsis and confusion. Imaging studies and physical examination are suggestive for acute cholecystitis. EXAM: IMAGE GUIDED PERCUTANEOUS CHOLECYSTOSTOMY TUBE PLACEMENT MEDICATIONS: Scheduled IV Zosyn 3.375 gm was given immediately prior to the procedure. ANESTHESIA/SEDATION: Patient was intubated and sedated prior to arriving in the Radiology department. Patient was monitored by radiology nurse throughout the procedure. FLUOROSCOPY TIME:  None COMPLICATIONS: None immediate. PROCEDURE: Informed written consent was obtained from the patient's daughter after a thorough discussion of the procedural risks, benefits and alternatives. All questions were addressed. Maximal Sterile Barrier Technique was utilized including caps,  mask, sterile gowns, sterile gloves, sterile drape, hand hygiene and skin antiseptic. A timeout was performed prior to the initiation of the procedure. Patient was supine on the CT scanner. The gallbladder was identified with CT and ultrasound. The anterior abdomen was prepped and draped in sterile fashion. Skin was anesthetized with 1% lidocaine. Using ultrasound guidance, an 18 gauge trocar needle was directed into the dilated gallbladder. A stiff Amplatz wire was advanced into the gallbladder with ultrasound guidance. The tract was dilated to accommodate a 10.2 [place] multipurpose drain. 90 mL of cloudy dark brown fluid was removed. Catheter was sutured to skin and placed to gravity bag. Follow up CT images confirm placement in the gallbladder. Ultrasound and CT images were saved for documentation. FINDINGS: Mild distention of the gallbladder with mild gallbladder wall thickening. Small amount of perihepatic ascites. Gallbladder drain was successfully placed within the gallbladder. 90 mL of cloudy brown fluid was removed and sent for culture. IMPRESSION: Successful image guided percutaneous cholecystostomy tube placement. Electronically Signed   By: Markus Daft M.D.   On: 10/04/2017 14:44   US Abdomen Limited Ruq  Result Date: 10/10/2017 CLINICAL DATA:  Evaluate drain in gallbladder from cholecystostomy EXAM: ULTRASOUND ABDOMEN LIMITED RIGHT UPPER QUADRANT COMPARISON:  Abdomen films of 10/15/2017 and CT abdomen of 10/04/2017 FINDINGS: Gallbladder: The gallbladder is decompressed and echodensity within the lumen is consistent with cholecystostomy tube. Common bile duct: Diameter: The common bile duct is normal measuring 5.4 mm. Liver: The liver is echogenic diffusely consistent with fatty infiltration. No focal hepatic abnormality is seen. Portal vein is patent on color Doppler imaging with normal direction of blood flow towards the liver. IMPRESSION: 1. Linear echodensity within the gallbladder is consistent  with cholecystostomy tube with decompression of the gallbladder. 2. Echogenic liver parenchyma consistent with diffuse fatty infiltration. No focal abnormality. Electronically Signed   By: Ivar Drape M.D.   On: 10/10/2017 08:32   US Abdomen Limited Ruq  Result Date: 10/02/2017 CLINICAL DATA:  Abdominal pain EXAM: ULTRASOUND ABDOMEN LIMITED RIGHT UPPER QUADRANT COMPARISON:  None. FINDINGS: Gallbladder: 2 small echogenic foci in the gallbladder do not move or shadow and could be polyps or stones. Gallbladder wall 2 mm. Mild pericholecystic fluid. Negative sonographic Murphy sign Common bile duct: Diameter: Mildly prominent 8 mm Liver: Echogenic liver difficult to image without focal abnormality. Portal vein is patent on color Doppler imaging with normal direction of blood flow towards the liver. IMPRESSION: Small polyps versus adherent gallstones. Pericholecystic fluid. Negative sonographic Murphy sign. Common bile duct mildly dilated 8 mm. Fatty liver Electronically Signed   By: Franchot Gallo M.D.   On: 10/02/2017 13:05    (Echo, Carotid, EGD, Colonoscopy, ERCP)  Subjective: Patient seen and examined the bedside this morning.  Remains comfortable.  Found to be a little hypotensive this morning.  Increased the dose of hydralazine on discharge.  His respiratory status stable.  Does not complain of any abdominal pain.  Has been having bowel movements.  Willing to go home strongly.   Discharge Exam: Vitals:   10/11/17 0900 10/11/17 0903  BP:  (!) 188/66  Pulse: 78 77  Resp: (!) 27 (!) 27  Temp:    SpO2: 98% 96%   Vitals:   10/11/17 0800 10/11/17 0819 10/11/17 0900 10/11/17 0903  BP:    (!) 188/66  Pulse: 68  78 77  Resp: 19  (!) 27 (!) 27  Temp:  98.1 F (36.7 C)    TempSrc:  Axillary    SpO2: 98%  98% 96%  Weight:      Height:        General: Pt is alert, awake, not in acute distress Cardiovascular: RRR, S1/S2 +, no rubs, no gallops Respiratory: CTA bilaterally, no wheezing, no  rhonchi Abdominal: Soft, NT, ND, bowel sounds +, cholecystostomy drain Extremities: 2+ edema on bilateral lower extremities, no cyanosis    The results of significant diagnostics from this hospitalization (including imaging, microbiology, ancillary and laboratory) are listed below for reference.     Microbiology: Recent Results (from the past 240 hour(s))  Culture, blood (Routine X 2) w Reflex to ID Panel     Status: None   Collection Time: 10/02/17 10:40 PM  Result Value Ref Range Status   Specimen Description BLOOD LEFT HAND  Final   Special Requests   Final    BOTTLES DRAWN AEROBIC ONLY Blood Culture adequate volume   Culture   Final    NO GROWTH 5 DAYS Performed at [facility], [address], [place], [place] [address]    Report Status 10/08/2017 FINAL  Final  Culture, blood (Routine X 2) w Reflex to ID Panel     Status: None   Collection Time: 10/02/17 10:40 PM  Result Value Ref Range Status   Specimen Description BLOOD RIGHT HAND  Final   Special Requests   Final    BOTTLES DRAWN AEROBIC AND ANAEROBIC Blood Culture adequate volume   Culture   Final    NO GROWTH 5 DAYS Performed at [facility], [place] [address] [place], [place] [address]    Report Status 10/08/2017 FINAL  Final  Culture, body fluid-bottle     Status: Abnormal   Collection Time: 10/04/17 12:15 PM  Result Value Ref Range Status   Specimen Description FLUID BILE  Final   Special Requests NONE  Final   Gram Stain (A)  Final    GRAM VARIABLE ROD GRAM POSITIVE COCCI IN BOTH AEROBIC AND ANAEROBIC BOTTLES    Culture (A)  Final    MULTIPLE ORGANISMS PRESENT, NONE PREDOMINANT NO ANAEROBES ISOLATED Performed at [facility], [place] [address], [place], [place] [address]    Report Status 10/07/2017 FINAL  Final  Gram stain     Status: None   Collection Time: 10/04/17 12:15 PM  Result Value Ref Range Status   Specimen Description FLUID  Final   Special Requests NONE  Final   Gram Stain    Final    FEW WBC PRESENT,BOTH PMN AND MONONUCLEAR MODERATE GRAM VARIABLE ROD FEW GRAM POSITIVE COCCI Performed at [facility], [place] [address], [place], [place] [address]    Report Status 10/04/2017  FINAL  Final  Culture, Urine     Status: None   Collection Time: 10/05/17  9:27 AM  Result Value Ref Range Status   Specimen Description URINE, CLEAN CATCH  Final   Special Requests NONE  Final   Culture   Final    NO GROWTH Performed at [facility], [address], [place], [place] [address]    Report Status 10/06/2017 FINAL  Final  MRSA PCR Screening     Status: None   Collection Time: 10/09/17 11:24 AM  Result Value Ref Range Status   MRSA by PCR NEGATIVE NEGATIVE Final    Comment:        The GeneXpert MRSA Assay (FDA approved for NASAL specimens only), is one component of a comprehensive MRSA colonization surveillance program. It is not intended to diagnose MRSA infection nor to guide or monitor treatment for MRSA infections.   Body fluid culture     Status: None (Preliminary result)   Collection Time: 10/09/17 11:53 AM  Result Value Ref Range Status   Specimen Description BILE  Final   Special Requests Normal  Final   Gram Stain   Final    ABUNDANT WBC PRESENT, PREDOMINANTLY PMN NO ORGANISMS SEEN    Culture   Final    CULTURE REINCUBATED FOR BETTER GROWTH Performed at [facility], [place] [address] [place], [place] [address]    Report Status PENDING  Incomplete     Labs: BNP (last 3 results) No results for input(s): BNP in the last 8760 hours. Basic Metabolic Panel: Recent Labs  Lab 10/06/17 0216 10/07/17 0336 10/07/17 1304 10/08/17 0257 10/09/17 0307 10/10/17 0331 10/11/17 0652  NA 141 139 140 141 137 137 137  K 3.7 3.4* 3.9 3.4* 3.6 3.2* 3.4*  CL 115* 113* 114* 115* 110 109 108  CO2 19* 18* 19* 20* 19* 20* 21*  GLUCOSE 130* 141* 215* 168* 152* 151* 179*  BUN 37* 33* 36* 40* 35* 33* 29*  CREATININE 1.93* 1.83*  1.77* 1.85* 1.88*  1.75* 1.67* 1.71*  CALCIUM 8.1* 8.4* 8.4* 8.5* 8.2* 8.1* 8.4*  MG 1.7 1.9  --   --   --   --   --   PHOS 2.9 3.2  --   --   --   --   --    Liver Function Tests: Recent Labs  Lab 10/05/17 0300 10/06/17 0216 10/07/17 1304 10/09/17 0307  AST 103* 83* 30 30  ALT 176* 144* 75* 49  ALKPHOS 143* 123 96 70  BILITOT 3.4* 3.6* 2.0* 1.4*  PROT 6.2* 6.1* 6.3* 5.6*  ALBUMIN 2.6* 2.3* 2.2* 1.9*   Recent Labs  Lab 10/05/17 0300  LIPASE 19   No results for input(s): AMMONIA in the last 168 hours. CBC: Recent Labs  Lab 10/05/17 0300 10/06/17 0719 10/07/17 0336 10/08/17 0257 10/10/17 0331  WBC 7.5 8.0 11.9* 11.2* 9.7  NEUTROABS  --  6.6 9.7*  --  6.8  HGB 10.2* 8.9* 9.8* 11.4* 9.4*  HCT 31.0* 26.4* 29.4* 34.6* 28.1*  MCV 85.9 84.9 85.2 85.9 85.2  PLT 167 208 297 385 413*   Cardiac Enzymes: Recent Labs  Lab 10/06/17 0216 10/07/17 0336 10/07/17 0745 10/07/17 1304 10/07/17 1838  CKTOTAL 115  --   --   --   --   CKMB 22.3*  --   --   --   --   TROPONINI  --  0.06* 0.05* 0.03* 0.03*   BNP: Invalid input(s):  POCBNP CBG: Recent Labs  Lab 10/10/17 1535 10/10/17 1941 10/10/17 2304 10/11/17 0406 10/11/17 0743  GLUCAP 148* 149* 148* 172* 180*   D-Dimer No results for input(s): DDIMER in the last 72 hours. Hgb A1c No results for input(s): HGBA1C in the last 72 hours. Lipid Profile No results for input(s): CHOL, HDL, LDLCALC, TRIG, CHOLHDL, LDLDIRECT in the last 72 hours. Thyroid function studies No results for input(s): TSH, T4TOTAL, T3FREE, THYROIDAB in the last 72 hours.  Invalid input(s): FREET3 Anemia work up No results for input(s): VITAMINB12, FOLATE, FERRITIN, TIBC, IRON, RETICCTPCT in the last 72 hours. Urinalysis    Component Value Date/Time   COLORURINE YELLOW 04/11/2007 [place] 04/11/2007 0917   LABSPEC 1.030 04/11/2007 0917   PHURINE 5.0 04/11/2007 0917   GLUCOSEU NEGATIVE 04/11/2007 0917   HGBUR NEGATIVE 04/11/2007 0917    BILIRUBINUR SMALL (A) 04/11/2007 0917   KETONESUR 15 (A) 04/11/2007 0917   PROTEINUR NEGATIVE 04/11/2007 0917   UROBILINOGEN 0.2 04/11/2007 0917   NITRITE NEGATIVE 04/11/2007 0917   LEUKOCYTESUR  04/11/2007 0917    NEGATIVE MICROSCOPIC NOT DONE ON URINES WITH NEGATIVE PROTEIN, BLOOD, LEUKOCYTES, NITRITE, OR GLUCOSE <1000 mg/dL.   Sepsis Labs Invalid input(s): PROCALCITONIN,  WBC,  LACTICIDVEN Microbiology Recent Results (from the past 240 hour(s))  Culture, blood (Routine X 2) w Reflex to ID Panel     Status: None   Collection Time: 10/02/17 10:40 PM  Result Value Ref Range Status   Specimen Description BLOOD LEFT HAND  Final   Special Requests   Final    BOTTLES DRAWN AEROBIC ONLY Blood Culture adequate volume   Culture   Final    NO GROWTH 5 DAYS Performed at [facility], [address], [place], [place] [address]    Report Status 10/08/2017 FINAL  Final  Culture, blood (Routine X 2) w Reflex to ID Panel     Status: None   Collection Time: 10/02/17 10:40 PM  Result Value Ref Range Status   Specimen Description BLOOD RIGHT HAND  Final   Special Requests   Final    BOTTLES DRAWN AEROBIC AND ANAEROBIC Blood Culture adequate volume   Culture   Final    NO GROWTH 5 DAYS Performed at [facility], [place] [address] [place], [place] [address]    Report Status 10/08/2017 FINAL  Final  Culture, body fluid-bottle     Status: Abnormal   Collection Time: 10/04/17 12:15 PM  Result Value Ref Range Status   Specimen Description FLUID BILE  Final   Special Requests NONE  Final   Gram Stain (A)  Final    GRAM VARIABLE ROD GRAM POSITIVE COCCI IN BOTH AEROBIC AND ANAEROBIC BOTTLES    Culture (A)  Final    MULTIPLE ORGANISMS PRESENT, NONE PREDOMINANT NO ANAEROBES ISOLATED Performed at [facility], [place] [address], [place], [place] [address]    Report Status 10/07/2017 FINAL  Final  Gram stain     Status: None   Collection Time: 10/04/17 12:15 PM  Result Value Ref  Range Status   Specimen Description FLUID  Final   Special Requests NONE  Final   Gram Stain   Final    FEW WBC PRESENT,BOTH PMN AND MONONUCLEAR MODERATE GRAM VARIABLE ROD FEW GRAM POSITIVE COCCI Performed at [facility], [place] [address] [place], [place] [address]    Report Status 10/04/2017 FINAL  Final  Culture, Urine     Status: None  Collection Time: 10/05/17  9:27 AM  Result Value Ref Range Status   Specimen Description URINE, CLEAN CATCH  Final   Special Requests NONE  Final   Culture   Final    NO GROWTH Performed at [facility], [address], [place], [place] [address]    Report Status 10/06/2017 FINAL  Final  MRSA PCR Screening     Status: None   Collection Time: 10/09/17 11:24 AM  Result Value Ref Range Status   MRSA by PCR NEGATIVE NEGATIVE Final    Comment:        The GeneXpert MRSA Assay (FDA approved for NASAL specimens only), is one component of a comprehensive MRSA colonization surveillance program. It is not intended to diagnose MRSA infection nor to guide or monitor treatment for MRSA infections.   Body fluid culture     Status: None (Preliminary result)   Collection Time: 10/09/17 11:53 AM  Result Value Ref Range Status   Specimen Description BILE  Final   Special Requests Normal  Final   Gram Stain   Final    ABUNDANT WBC PRESENT, PREDOMINANTLY PMN NO ORGANISMS SEEN    Culture   Final    CULTURE REINCUBATED FOR BETTER GROWTH Performed at [facility], [place] [address], [place], [place] [address]    Report Status PENDING  Incomplete     Time coordinating discharge: Over 30 minutes  SIGNED:   Marene Lenz, MD  [facility] 10/11/2017, 9:59 AM Pager [phone]  If 7PM-7AM, please contact night-coverage [email or web address] Password [email or web address]

## 2017-10-11 NOTE — Progress Notes (Signed)
Patient refusing some care, such as blood sugar checks, and to have maintenance fluids running through IV. Patient educated working on finding a compromise. Also states he has no intentions on staying another four hours to allow his Zosyn to run through. Discussed with pharmacist will run as a 30 minute antibiotic. Patient calling his daughter to come pick him up later. MD rounding will notify of patient plans. Discharge teaching, ([place] of drain etc.) began in case patient decides to leave against medical advice.

## 2017-10-11 NOTE — Progress Notes (Signed)
[phone]/VLDKCC Cash[place] Harmes,BSN,RN3,CCM [phone]/text sent to Santiago Glad with [facility] for RN in the home.

## 2017-10-11 NOTE — Telephone Encounter (Signed)
Copied from [place] [id number]. Topic: Quick Communication - See Telephone Encounter >> Oct 11, 2017  8:58 AM Synthia Innocent wrote: CRM for notification. See Telephone encounter for: had emergency surgery, gall bladder last week. Was told by hospital that benazapril was causing liver damage. They changed his meds to hydrALAZINE (APRESOLINE) tablet 25 mg and labetalol (NORMODYNE)tablet 100 mg. Would like to speak to nurse 10/11/17.

## 2017-10-11 NOTE — Progress Notes (Signed)
Patient calling his PCP for a follow up appointment and states his daughter will be here to pick him up at [phone]. Explained no discharge orders have been placed yet, will page MD. Patient agreeable at this time to wait.

## 2017-10-11 NOTE — Progress Notes (Signed)
Date: October 11, 2017 Chart review for discharge needs:  RN ordered for drain flushes and teaching, through [facility]. Patient has no questions concerning post hospital care. Velva Harman, BSN, [place], [place]

## 2017-10-11 NOTE — Telephone Encounter (Signed)
Called him back- he was discharged to home today from recent hospital stay for severe illness.  He was a bit confused about his meds and we went over them Send him a mychart that he can reply to if he needs me this weekend

## 2017-10-11 NOTE — Progress Notes (Signed)
PT Cancellation Note  Patient Details Name: Timothy Chapman MRN: [id number] DOB: 08-02-1946   Cancelled Treatment:     pt stated he was leaving today and is able to walk the unit with his walker.   Rica Koyanagi  PTA [facility]  Acute  Rehab Pager      [phone]

## 2017-10-11 NOTE — Progress Notes (Signed)
Patient and his wife provided with discharge instructions and all new medications explained. Both patient and his wife asked questions appropriately and were given ample time to practice dressing changes and drain emptying. Explained home health will manage drain but provided demonstrations on how to dress and drain in case RN not at home when a dressing change or drainage is needed. Case management arranging home health to come out. Prescriptions sent over to pharmacy. Wife dressing patient and Iv removed by RN. Will wheel down to exit when his ride arrives.

## 2017-10-12 ENCOUNTER — Telehealth: Payer: Self-pay | Admitting: Family Medicine

## 2017-10-12 MED ORDER — LEVOFLOXACIN 750 MG PO TABS: ORAL_TABLET | ORAL | 0 refills | Status: DC

## 2017-10-12 NOTE — Telephone Encounter (Signed)
Called pt on 1/26- I received results from his recent culture - bile fluid.  It grew out E coli and enterococcus.  It appears that Augmentin may not cover this.  Called and spoke to pharmD at [facility] who recommended levaquin for him - 750 daily for one week  Creat clearance is 49 according to epic- will dose the levquin every OTHER day  Lab Results  Component Value Date   HGBA1C 8.9 (H) 07/09/2017   -  Meds ordered this encounter  Medications  . levofloxacin (LEVAQUIN) 750 MG tablet    Sig: Take 1 pill by mouth every other day for 5 doses    Dispense:  5 tablet    Refill:  0

## 2017-10-13 DIAGNOSIS — G4730 Sleep apnea, unspecified: Secondary | ICD-10-CM | POA: Diagnosis not present

## 2017-10-13 DIAGNOSIS — R69 Illness, unspecified: Secondary | ICD-10-CM | POA: Diagnosis not present

## 2017-10-13 DIAGNOSIS — K8309 Other cholangitis: Secondary | ICD-10-CM | POA: Diagnosis not present

## 2017-10-13 DIAGNOSIS — Z4803 Encounter for change or removal of drains: Secondary | ICD-10-CM | POA: Diagnosis not present

## 2017-10-13 DIAGNOSIS — I10 Essential (primary) hypertension: Secondary | ICD-10-CM | POA: Diagnosis not present

## 2017-10-13 DIAGNOSIS — E119 Type 2 diabetes mellitus without complications: Secondary | ICD-10-CM | POA: Diagnosis not present

## 2017-10-13 DIAGNOSIS — Z87891 Personal history of nicotine dependence: Secondary | ICD-10-CM | POA: Diagnosis not present

## 2017-10-13 DIAGNOSIS — Z7982 Long term (current) use of aspirin: Secondary | ICD-10-CM | POA: Diagnosis not present

## 2017-10-13 DIAGNOSIS — Z7984 Long term (current) use of oral hypoglycemic drugs: Secondary | ICD-10-CM | POA: Diagnosis not present

## 2017-10-14 ENCOUNTER — Other Ambulatory Visit: Payer: Self-pay | Admitting: General Surgery

## 2017-10-14 DIAGNOSIS — K8000 Calculus of gallbladder with acute cholecystitis without obstruction: Secondary | ICD-10-CM

## 2017-10-14 LAB — BODY FLUID CULTURE: Special Requests: NORMAL

## 2017-10-16 NOTE — Telephone Encounter (Signed)
Called pt so I could speak with him directly He reports that he saw the [place] today. They put him on a cough syrup and lasix.  His BP was 212/?84 today He is on several new meds so very difficult to determine what is making him itch He wonders if the hydralazine may be to blame as he maybe itches more after he takes his TID dose, but he is not really sure The rash is now resolved Due to his very high BP hesitate to withdraw the hydralazine immediately- he will take notice of any sx that occur after he takes his next dose and update me tomorrow If any severe allergic sx he will take 2 bendryl and go to the ER

## 2017-10-17 ENCOUNTER — Telehealth: Payer: Self-pay | Admitting: *Deleted

## 2017-10-17 NOTE — Telephone Encounter (Signed)
Received Physician Orders from [facility]; forwarded to provider/SLS 01/31

## 2017-10-18 DIAGNOSIS — Z7982 Long term (current) use of aspirin: Secondary | ICD-10-CM | POA: Diagnosis not present

## 2017-10-18 DIAGNOSIS — Z87891 Personal history of nicotine dependence: Secondary | ICD-10-CM | POA: Diagnosis not present

## 2017-10-18 DIAGNOSIS — G4730 Sleep apnea, unspecified: Secondary | ICD-10-CM | POA: Diagnosis not present

## 2017-10-18 DIAGNOSIS — Z4803 Encounter for change or removal of drains: Secondary | ICD-10-CM | POA: Diagnosis not present

## 2017-10-18 DIAGNOSIS — I10 Essential (primary) hypertension: Secondary | ICD-10-CM | POA: Diagnosis not present

## 2017-10-18 DIAGNOSIS — R69 Illness, unspecified: Secondary | ICD-10-CM | POA: Diagnosis not present

## 2017-10-18 DIAGNOSIS — Z7984 Long term (current) use of oral hypoglycemic drugs: Secondary | ICD-10-CM | POA: Diagnosis not present

## 2017-10-18 DIAGNOSIS — E119 Type 2 diabetes mellitus without complications: Secondary | ICD-10-CM | POA: Diagnosis not present

## 2017-10-18 DIAGNOSIS — K8309 Other cholangitis: Secondary | ICD-10-CM | POA: Diagnosis not present

## 2017-10-21 ENCOUNTER — Inpatient Hospital Stay: Payer: Medicare HMO | Admitting: Family Medicine

## 2017-10-22 DIAGNOSIS — K810 Acute cholecystitis: Secondary | ICD-10-CM | POA: Diagnosis not present

## 2017-10-22 NOTE — Progress Notes (Addendum)
[place] [facility] [address], [place], [place] [address] [phone]  Date:  10/23/2017   Name:  Timothy Chapman [facility]   DOB:  October 24, 1945   MRN:  [id number]  PCP:  [name], MD    Chief Complaint: Hospitalization Follow-up (Pt here for hospital f/u visit. Pt states that has had to cut back on GlipiZIDE, stop Victoza and occ takes Metformin due to blood sugar being low. )   History of Present Illness:  Timothy Chapman is a 72 y.o. very pleasant male patient who presents with the following:  Hospital follow-up today- he was recently admitted for several days and was pretty sick, intubated and in the unit for a couple of days  Admit date: 10/02/2017 Discharge date: 10/11/2017 Brief/Interim Summary: Patient is a 72 year old male with past medical history of hypertension, diabetes who presented with complaints of abdominal pain and fever. Found to have elevated LFTs. He was diagnosed with cholangitis. He also sustained  acute respiratory failure with hypoxia. He had to be admitted to ICU after intubation. He is currently status post cholecystostomy on 10/04/17. Surgery was following.IR was following.  Patient's overall condition is stabilized and patient was transferred to hospital service. Patient has been discharged home with home health today.  Acute respiratory failure with hypoxia: Resolved. Currently saturating fine on room air. No respiratory issues currently. Intubated on 10/04/2016 extubated on 10/06/2017 Acute cholecystitis/cholangitis/sepsis shock: s/p perc cholecystomy drainage 10/04/2017 Surgery was on board. IR was managing the cholecystostomy drain. Drain is still yielding straw-colored fluid LFTstrendeddown. Treated with  empiric IV antibiotics.Was On Zosyn.Patient is afebrile. Changed antibiotics to oral on discharge , on Augmentin. He should follow-up with IR as an outpatient.  Appropriate  recommendations have been listed. Uncontrolled hypertension:  Started on oral hydralazine,labetelol. Cannot tolerate amlodipine due to leg edema. He is on benazeprilandhydrochlorothiazideat home. It is on hold due to acute kidney injury. His blood pressures will be monitored as an outpatient AKI (acute kidney injury) ([place]) Baseline creatinine around 1.0 Probably ATN. Kidney function improving With gentleIV fluids Renal ultrasound showed mid right kidney cyst. He  needs follow-up as an outpatient with nephrology. Ileus: Resolved Likely due to sepsis in the setting of cholangitis, surgery was on board. Patient has been started on   diet. NG tube removed. Abdominal x-raydone on 1/23/19showed complete resolution of small bowel dilatation . diabetes mellitus type II: He should follow-up with his PCP for monitoring of his diabetes.  We will continue home regimen on discharge for now. Hypokalemia: Corrected  I then got a culture back from his operation and changed his DC abx from augmentin to levaquin He finished his levaquin that we used for his infection  Lab Results  Component Value Date   HGBA1C 8.9 (H) 07/09/2017   BP Readings from Last 3 Encounters:  10/23/17 124/82  10/11/17 (!) 188/66  10/04/17 (!) 189/96   The itching, etc that he called me about recently has resolved  He is able to eat again but his appetite is still not back to normal He feels like he gets full more easily  No nausea or vomiting  No fever since he got home He does not have any pain from his surgical sites  Wt Readings from Last 3 Encounters:  10/23/17 239 lb (108.4 kg)  10/08/17 243 lb 6.2 oz (110.4 kg)  10/02/17 230 lb (104.3 kg)   [facility]  BID This is helping with swelling in his feet and legs.   He has had some swelling in his legs in the past He is not known to have CHF per our knowledge No orthopnea  His energy level is slowly getting better He is  grateful to have had his wife to help him through his recent illness  Patient Active Problem List   Diagnosis Date Noted  . Acute cholangitis 10/08/2017  . SBO (small bowel obstruction) ([place]) 10/08/2017  . ATN (acute tubular necrosis) ([place]) 10/08/2017  . Septic shock ([place]) 10/04/2017  . Acute cholecystitis s/p perc cholecystomy drainage 10/04/2017   . Sepsis ([place])   . Acute respiratory failure with hypoxia ([place])   . AKI (acute kidney injury) ([place])   . Abdominal pain 10/02/2017  . Chronic back pain 02/08/2016  . Controlled type 2 diabetes mellitus with diabetic neuropathy, without long-term current use of insulin ([place]) 02/08/2016  . Insomnia 02/08/2016  . History of diverticulitis 02/08/2016  . HYPERTENSION, BENIGN ESSENTIAL, UNCONTROLLED 03/12/2009  . ABNORMAL ELECTROCARDIOGRAM 03/12/2009    Past Medical History:  Diagnosis Date  . AKI (acute kidney injury) ([place])   . Anxiety   . Arthritis   . Depression   . Diabetes mellitus without complication ([place])   . GERD (gastroesophageal reflux disease)   . Hypertension   . Neuromuscular disorder ([place])   . Sleep apnea     Past Surgical History:  Procedure Laterality Date  . BACK SURGERY     X2  . REPLACEMENT TOTAL KNEE Left   . ROTATOR CUFF REPAIR      Social History   Tobacco Use  . Smoking status: Former Smoker    Last attempt to quit: 09/18/1975    Years since quitting: 42.1  . Smokeless tobacco: Never Used  Substance Use Topics  . Alcohol use: Yes    Alcohol/week: 0.0 oz    Comment: Rarely  . Drug use: Not on file    Family History  Problem Relation Age of Onset  . Heart failure Father   . Heart disease Father   . Emphysema Mother     Allergies  Allergen Reactions  . Codeine     Medication list has been reviewed and updated.  Current Outpatient Medications on File Prior to Visit  Medication Sig Dispense Refill  . Acetylcarnitine HCl 500 MG CAPS Take 500 mg by mouth daily.    . Alcohol Swabs (ALCOHOL  PREP) PADS Use prior to insulin dose 200 each prn  . Alpha-Lipoic Acid 600 MG CAPS Take 600 mg by mouth daily.    Marland Kitchen aspirin 81 MG chewable tablet Chew 1 tablet (81 mg total) by mouth daily. 30 tablet 0  . diclofenac (VOLTAREN) 75 MG EC tablet Take 1 tablet (75 mg total) by mouth 2 (two) times daily. Use as needed for joint pains 180 tablet 3  . famotidine (PEPCID) 20 MG tablet Take 1 tablet (20 mg total) by mouth 2 (two) times daily. 30 tablet 0  . glucose blood test strip Use as instructed 100 each 12  . hydrALAZINE (APRESOLINE) 50 MG tablet Take 1 tablet (50 mg total) by mouth every 8 (eight) hours. 90 tablet 0  . Insulin Pen Needle (PEN NEEDLES) 31G X 5 MM MISC 1 each by Does not apply route daily. 100 each prn  . labetalol (NORMODYNE) 100 MG tablet Take 1 tablet (100 mg total) by mouth 2 (two) times daily. 60 tablet 0  . levofloxacin (LEVAQUIN) 750 MG tablet  Take 1 pill by mouth every other day for 5 doses 5 tablet 0  . Melatonin 3 MG TABS Take 1 tablet by mouth at bedtime.     . Multiple Vitamin (MULTIVITAMIN) tablet Take 1 tablet by mouth daily.    . niacin (NIASPAN) 500 MG CR tablet Take 1 tablet (500 mg total) by mouth at bedtime. 30 tablet 6  . omeprazole (PRILOSEC) 40 MG capsule Take 1 capsule (40 mg total) by mouth daily. (Patient taking differently: Take 40 mg by mouth daily as needed (heartburn). ) 90 capsule 3  . tadalafil (CIALIS) 5 MG tablet Take 1 tablet (5 mg total) daily as needed by mouth for erectile dysfunction (and BPH). 90 tablet 3  . Turmeric 450 MG CAPS Take 1 tablet by mouth daily.     Marland Kitchen glipiZIDE (GLUCOTROL) 10 MG tablet Take 1 tablet (10 mg total) by mouth 2 (two) times daily before a meal. 180 tablet 3  . liraglutide (VICTOZA) 18 MG/3ML SOPN Inject 1.8 mg [place] daily (Patient not taking: Reported on 10/23/2017) 9 pen 3  . metFORMIN (GLUCOPHAGE) 1000 MG tablet Take 1 tablet (1,000 mg total) by mouth 2 (two) times daily with a meal. 180 tablet 3   No current  facility-administered medications on file prior to visit.     Review of Systems:  As per HPI- otherwise negative.   Physical Examination: Vitals:   10/23/17 1223  BP: 124/82  Pulse: 70  Temp: 97.9 F (36.6 C)  SpO2: 98%   Vitals:   10/23/17 1223  Weight: 239 lb (108.4 kg)  Height: 5\' 9"  (1.753 m)   Body mass index is 35.29 kg/m. Ideal Body Weight: Weight in (lb) to have BMI = 25: 168.9  GEN: WDWN, NAD, Non-toxic, A & O x 3, overweight, not quite his normal self but does not look acutely ill either  HEENT: Atraumatic, Normocephalic. Neck supple. No masses, No LAD. Ears and Nose: No external deformity. CV: RRR, No M/G/R. No JVD. No thrill. No extra heart sounds. PULM: CTA B, no wheezes, crackles, rhonchi. No retractions. No resp. distress. No accessory muscle use. ABD: S, NT, ND, +BS. No rebound. No HSM.  Belly is benign to palpation today EXTR: No c/c. He has soft edema of his bilateral legs NEURO Normal gait.  PSYCH: Normally interactive. Conversant. Not depressed or anxious appearing.  Calm demeanor.  He has a drain in place still- from RUQ.  He reports that the home health nurse changed dressing and checked on the site for him today He is seeing his surgeon in about 10 days  Obtained a chest film today CHEST 2 VIEW  COMPARISON: October 08, 2017  FINDINGS: There is no edema or consolidation. The heart size and pulmonary vascularity are normal. No adenopathy. There is degenerative change in the lower thoracic spine. There is a drain in the right abdominal region.  IMPRESSION: No edema or consolidation.  Assessment and Plan: Hospital discharge follow-up - Plan: CBC, Comprehensive metabolic panel  Acute renal insufficiency  Controlled type 2 diabetes mellitus with diabetic neuropathy, without long-term current use of insulin (HCC) - Plan: Hemoglobin A1c  Shortness of breath - Plan: B Nat Peptide, DG Chest 2 View  Here today to follow-up from recent long  hospital stay for cholangitis and also acute resp failure with intubation He is overall much better  He seems to be retaining fluid- will obtain CXR and also check BNP for any evidence of CHF A1c today He will watch for  any fever or other sign of worsening and seek help if these occur  Signed Lamar Blinks, MD  Received his labs and CXR as above Results for orders placed or performed in visit on 10/23/17  CBC  Result Value Ref Range   WBC 9.2 4.0 - 10.5 K/uL   RBC 3.61 (L) 4.22 - 5.81 Mil/uL   Platelets 421.0 (H) 150.0 - 400.0 K/uL   Hemoglobin 10.2 (L) 13.0 - 17.0 g/dL   HCT 31.5 (L) 39.0 - 52.0 %   MCV 87.2 78.0 - 100.0 fl   MCHC 32.3 30.0 - 36.0 g/dL   RDW 14.2 11.5 - 15.5 %  Comprehensive metabolic panel  Result Value Ref Range   Sodium 137 135 - 145 mEq/L   Potassium 4.9 3.5 - 5.1 mEq/L   Chloride 101 96 - 112 mEq/L   CO2 28 19 - 32 mEq/L   Glucose, Bld 121 (H) 70 - 99 mg/dL   BUN 24 (H) 6 - 23 mg/dL   Creatinine, Ser 1.77 (H) 0.40 - 1.50 mg/dL   Total Bilirubin 0.6 0.2 - 1.2 mg/dL   Alkaline Phosphatase 49 39 - 117 U/L   AST 18 0 - 37 U/L   ALT 22 0 - 53 U/L   Total Protein 7.8 6.0 - 8.3 g/dL   Albumin 3.8 3.5 - 5.2 g/dL   Calcium 9.6 8.4 - 10.5 mg/dL   GFR 40.72 (L) >60.00 mL/min  Hemoglobin A1c  Result Value Ref Range   Hgb A1c MFr Bld 6.8 (H) 4.6 - 6.5 %  B Nat Peptide  Result Value Ref Range   Pro B Natriuretic peptide (BNP) 165.0 (H) 0.0 - 100.0 pg/mL    He is just taking metformin right now as glucose is running low. Creat clearance is over 60 so ok for metformin  Message to pt:  Your blood counts are getting a bit better Metabolic profile shows that your kidneys are not yet fully recovered.  We will watch this carefully Your A1c looks fine- ok to continue metformin only as you are for the time being Your BNP is a bit high but not highly suggestive of heart failure Also, your chest x-ray does not show any fluid in your lungs-   CHEST 2  VIEW COMPARISON: October 08, 2017  FINDINGS: There is no edema or consolidation. The heart size and pulmonary vascularity are normal. No adenopathy. There is degenerative change in the lower thoracic spine. There is a drain in the right abdominal region. IMPRESSION: No edema or consolidation.  This is good news of course! It does not appear that heart failure is the cause of the fluid build up in your legs Speaking of, can you let me know your dose of lasix from the [place]?    Please see me in 3 weeks to check on your progress.  Let me know if you are not continuing to gradually regain your strength

## 2017-10-23 ENCOUNTER — Encounter: Payer: Self-pay | Admitting: Family Medicine

## 2017-10-23 ENCOUNTER — Ambulatory Visit (HOSPITAL_BASED_OUTPATIENT_CLINIC_OR_DEPARTMENT_OTHER)
Admission: RE | Admit: 2017-10-23 | Discharge: 2017-10-23 | Disposition: A | Discharge status: Home / Self Care | Payer: Medicare HMO | Source: Ambulatory Visit | Attending: Family Medicine | Admitting: Family Medicine

## 2017-10-23 ENCOUNTER — Ambulatory Visit (INDEPENDENT_AMBULATORY_CARE_PROVIDER_SITE_OTHER): Payer: Medicare HMO | Admitting: Family Medicine

## 2017-10-23 VITALS — BP 124/82 | HR 70 | Temp 97.9°F | Ht 69.0 in | Wt 239.0 lb

## 2017-10-23 DIAGNOSIS — I10 Essential (primary) hypertension: Secondary | ICD-10-CM | POA: Diagnosis not present

## 2017-10-23 DIAGNOSIS — E1140 Type 2 diabetes mellitus with diabetic neuropathy, unspecified: Secondary | ICD-10-CM

## 2017-10-23 DIAGNOSIS — R0602 Shortness of breath: Secondary | ICD-10-CM | POA: Insufficient documentation

## 2017-10-23 DIAGNOSIS — R69 Illness, unspecified: Secondary | ICD-10-CM | POA: Diagnosis not present

## 2017-10-23 DIAGNOSIS — K8309 Other cholangitis: Secondary | ICD-10-CM | POA: Diagnosis not present

## 2017-10-23 DIAGNOSIS — R05 Cough: Secondary | ICD-10-CM | POA: Diagnosis not present

## 2017-10-23 DIAGNOSIS — G4730 Sleep apnea, unspecified: Secondary | ICD-10-CM | POA: Diagnosis not present

## 2017-10-23 DIAGNOSIS — E119 Type 2 diabetes mellitus without complications: Secondary | ICD-10-CM | POA: Diagnosis not present

## 2017-10-23 DIAGNOSIS — N289 Disorder of kidney and ureter, unspecified: Secondary | ICD-10-CM | POA: Diagnosis not present

## 2017-10-23 DIAGNOSIS — Z7984 Long term (current) use of oral hypoglycemic drugs: Secondary | ICD-10-CM | POA: Diagnosis not present

## 2017-10-23 DIAGNOSIS — Z87891 Personal history of nicotine dependence: Secondary | ICD-10-CM | POA: Diagnosis not present

## 2017-10-23 DIAGNOSIS — Z09 Encounter for follow-up examination after completed treatment for conditions other than malignant neoplasm: Secondary | ICD-10-CM | POA: Diagnosis not present

## 2017-10-23 DIAGNOSIS — Z4803 Encounter for change or removal of drains: Secondary | ICD-10-CM | POA: Diagnosis not present

## 2017-10-23 DIAGNOSIS — Z7982 Long term (current) use of aspirin: Secondary | ICD-10-CM | POA: Diagnosis not present

## 2017-10-23 LAB — COMPREHENSIVE METABOLIC PANEL
ALT: 22 U/L (ref 0–53)
AST: 18 U/L (ref 0–37)
Albumin: 3.8 g/dL (ref 3.5–5.2)
Alkaline Phosphatase: 49 U/L (ref 39–117)
BILIRUBIN TOTAL: 0.6 mg/dL (ref 0.2–1.2)
BUN: 24 mg/dL — ABNORMAL HIGH (ref 6–23)
CALCIUM: 9.6 mg/dL (ref 8.4–10.5)
CHLORIDE: 101 meq/L (ref 96–112)
CO2: 28 meq/L (ref 19–32)
Creatinine, Ser: 1.77 mg/dL — ABNORMAL HIGH (ref 0.40–1.50)
GFR: 40.72 mL/min — AB (ref >60.00)
GLUCOSE: 121 mg/dL — AB (ref 70–99)
POTASSIUM: 4.9 meq/L (ref 3.5–5.1)
Sodium: 137 mEq/L (ref 135–145)
Total Protein: 7.8 g/dL (ref 6.0–8.3)

## 2017-10-23 LAB — CBC
HCT: 31.5 % — ABNORMAL LOW (ref 39.0–52.0)
HEMOGLOBIN: 10.2 g/dL — AB (ref 13.0–17.0)
MCHC: 32.3 g/dL (ref 30.0–36.0)
MCV: 87.2 fl (ref 78.0–100.0)
PLATELETS: 421 10*3/uL — AB (ref 150.0–400.0)
RBC: 3.61 Mil/uL — ABNORMAL LOW (ref 4.22–5.81)
RDW: 14.2 % (ref 11.5–15.5)
WBC: 9.2 10*3/uL (ref 4.0–10.5)

## 2017-10-23 LAB — HEMOGLOBIN A1C: Hgb A1c MFr Bld: 6.8 % — ABNORMAL HIGH (ref 4.6–6.5)

## 2017-10-23 LAB — BRAIN NATRIURETIC PEPTIDE: PRO B NATRI PEPTIDE: 165 pg/mL — AB (ref 0.0–100.0)

## 2017-10-23 NOTE — Patient Instructions (Addendum)
It was good to see you today- I am so glad that you are doing better!  We are going to get labs and a chest x-ray today- I will be in touch with your reports.  Please let me know if you are not feeling ok or if you have any fever

## 2017-10-24 ENCOUNTER — Other Ambulatory Visit: Payer: Medicare HMO

## 2017-10-29 DIAGNOSIS — K810 Acute cholecystitis: Secondary | ICD-10-CM | POA: Diagnosis not present

## 2017-10-30 ENCOUNTER — Other Ambulatory Visit: Payer: Self-pay | Admitting: General Surgery

## 2017-10-30 ENCOUNTER — Ambulatory Visit
Admission: RE | Admit: 2017-10-30 | Discharge: 2017-10-30 | Disposition: A | Discharge status: Home / Self Care | Payer: Medicare HMO | Source: Ambulatory Visit | Attending: General Surgery | Admitting: General Surgery

## 2017-10-30 ENCOUNTER — Encounter: Payer: Self-pay | Admitting: Radiology

## 2017-10-30 ENCOUNTER — Ambulatory Visit
Admission: RE | Admit: 2017-10-30 | Discharge: 2017-10-30 | Disposition: A | Discharge status: Home / Self Care | Payer: Medicare HMO | Source: Ambulatory Visit | Attending: Radiology | Admitting: Radiology

## 2017-10-30 DIAGNOSIS — K810 Acute cholecystitis: Secondary | ICD-10-CM | POA: Diagnosis not present

## 2017-10-30 DIAGNOSIS — K8000 Calculus of gallbladder with acute cholecystitis without obstruction: Secondary | ICD-10-CM

## 2017-10-30 HISTORY — PX: IR RADIOLOGIST EVAL & MGMT: IMG5224

## 2017-10-30 NOTE — Progress Notes (Signed)
Patient ID: Timothy Chapman, male   DOB: September 11, 1946, 72 y.o.   MRN: [id number]   Referring Physician(s): Fanny Skates  Chief Complaint: The patient is seen in follow up today s/p percutaneous cholecystostomy drain on 10-04-17  History of present illness:  Timothy Chapman is a 72 yo male who present to [facility] hospital with elevated LFTs as well as renal failure, and subsequent respiratory failure.  After much workup he was found to likely have acute cholecystitis.  Given his critical illness at the time, a perc chole drain was requested.  This was placed on 10-04-17.  The patient ultimately improved and was discharged home.  He returned to clinic today for follow up and drain injection.  He is no longer on abx therapy.  He occasionally still has some RUQ abdominal pain.  He flushes his drain with 10cc daily and has about 400cc of output on a daily basis.  He is scheduled to see Dr. Dalbert Batman on 11-04-17.  Past Medical History:  Diagnosis Date  . AKI (acute kidney injury) ([place])   . Anxiety   . Arthritis   . Depression   . Diabetes mellitus without complication ([place])   . GERD (gastroesophageal reflux disease)   . Hypertension   . Neuromuscular disorder ([place])   . Sleep apnea     Past Surgical History:  Procedure Laterality Date  . BACK SURGERY     X2  . REPLACEMENT TOTAL KNEE Left   . ROTATOR CUFF REPAIR      Allergies: Codeine  Medications: Prior to Admission medications   Medication Sig Start Date End Date Taking? Authorizing Provider  Acetylcarnitine HCl 500 MG CAPS Take 500 mg by mouth daily.    [provider]  Alcohol Swabs (ALCOHOL PREP) PADS Use prior to insulin dose 08/22/17   Copland, Gay Filler, MD  Alpha-Lipoic Acid 600 MG CAPS Take 600 mg by mouth daily.    [provider]  aspirin 81 MG chewable tablet Chew 1 tablet (81 mg total) by mouth daily. 10/12/17   Marene Lenz, MD  diclofenac (VOLTAREN) 75 MG EC tablet Take 1 tablet (75 mg total) by mouth 2 (two)  times daily. Use as needed for joint pains 07/19/17   Copland, Gay Filler, MD  famotidine (PEPCID) 20 MG tablet Take 1 tablet (20 mg total) by mouth 2 (two) times daily. 10/11/17   Marene Lenz, MD  glipiZIDE (GLUCOTROL) 10 MG tablet Take 1 tablet (10 mg total) by mouth 2 (two) times daily before a meal. 07/08/17   Copland, Gay Filler, MD  glucose blood test strip Use as instructed 06/28/16   Copland, Gay Filler, MD  hydrALAZINE (APRESOLINE) 50 MG tablet Take 1 tablet (50 mg total) by mouth every 8 (eight) hours. 10/11/17 11/10/17  Marene Lenz, MD  Insulin Pen Needle (PEN NEEDLES) 31G X 5 MM MISC 1 each by Does not apply route daily. 08/22/17   Copland, Gay Filler, MD  labetalol (NORMODYNE) 100 MG tablet Take 1 tablet (100 mg total) by mouth 2 (two) times daily. 10/11/17 11/10/17  Marene Lenz, MD  levofloxacin (LEVAQUIN) 750 MG tablet Take 1 pill by mouth every other day for 5 doses 10/12/17   Copland, Gay Filler, MD  liraglutide (VICTOZA) 18 MG/3ML SOPN Inject 1.8 mg [place] daily Patient not taking: Reported on 10/23/2017 07/08/17   Copland, Gay Filler, MD  Melatonin 3 MG TABS Take 1 tablet by mouth at bedtime.     [provider]  metFORMIN (GLUCOPHAGE) 1000 MG tablet Take 1 tablet (1,000 mg total) by mouth 2 (two) times daily with a meal. 07/08/17   Copland, Gay Filler, MD  Multiple Vitamin (MULTIVITAMIN) tablet Take 1 tablet by mouth daily.    [provider]  niacin (NIASPAN) 500 MG CR tablet Take 1 tablet (500 mg total) by mouth at bedtime. 02/08/16   Copland, Gay Filler, MD  omeprazole (PRILOSEC) 40 MG capsule Take 1 capsule (40 mg total) by mouth daily. Patient taking differently: Take 40 mg by mouth daily as needed (heartburn).  07/08/17   Copland, Gay Filler, MD  tadalafil (CIALIS) 5 MG tablet Take 1 tablet (5 mg total) daily as needed by mouth for erectile dysfunction (and BPH). 07/24/17   Copland, Gay Filler, MD  Turmeric 450 MG CAPS Take 1 tablet by mouth daily.      [provider]     Family History  Problem Relation Age of Onset  . Heart failure Father   . Heart disease Father   . Emphysema Mother     Social History   Socioeconomic History  . Marital status: Divorced    Spouse name: Not on file  . Number of children: Not on file  . Years of education: Not on file  . Highest education level: Not on file  Social Needs  . Financial resource strain: Not on file  . Food insecurity - worry: Not on file  . Food insecurity - inability: Not on file  . Transportation needs - medical: Not on file  . Transportation needs - non-medical: Not on file  Occupational History  . Not on file  Tobacco Use  . Smoking status: Former Smoker    Last attempt to quit: 09/18/1975    Years since quitting: 42.1  . Smokeless tobacco: Never Used  Substance and Sexual Activity  . Alcohol use: Yes    Alcohol/week: 0.0 oz    Comment: Rarely  . Drug use: Not on file  . Sexual activity: Not on file  Other Topics Concern  . Not on file  Social History Narrative  . Not on file     Vital Signs: There were no vitals taken for this visit.  Physical Exam  Abd: soft, NT, Nd, +BS, RUQ perc chole drain in place.  Site is c/d/i.   Imaging: No results found.  Labs:  CBC: Recent Labs    10/07/17 0336 10/08/17 0257 10/10/17 0331 10/23/17 1258  WBC 11.9* 11.2* 9.7 9.2  HGB 9.8* 11.4* 9.4* 10.2*  HCT 29.4* 34.6* 28.1* 31.5*  PLT 297 385 413* 421.0*    COAGS: Recent Labs    10/04/17 0412  INR 1.04    BMP: Recent Labs    10/08/17 0257 10/09/17 0307 10/10/17 0331 10/11/17 0652 10/23/17 1258  NA 141 137 137 137 137  K 3.4* 3.6 3.2* 3.4* 4.9  CL 115* 110 109 108 101  CO2 20* 19* 20* 21* 28  GLUCOSE 168* 152* 151* 179* 121*  BUN 40* 35* 33* 29* 24*  CALCIUM 8.5* 8.2* 8.1* 8.4* 9.6  CREATININE 1.88* 1.75* 1.67* 1.71* 1.77*  GFRNONAA 35* 38* 40* 39*  --   GFRAA 40* 44* 47* 45*  --     LIVER FUNCTION TESTS: Recent Labs     10/06/17 0216 10/07/17 1304 10/09/17 0307 10/23/17 1258  BILITOT 3.6* 2.0* 1.4* 0.6  AST 83* 30 30 18   ALT 144* 75* 49 22  ALKPHOS 123 96 70 49  PROT 6.1* 6.3* 5.6*  7.8  ALBUMIN 2.3* 2.2* 1.9* 3.8    Assessment:  1. Acute cholecystitis, s/p perc chole drain on 10-04-17  The patient has made significant improvements since placement of this drain.  He returns today though only about 3.5 weeks from placement.  Drain injection was completed, but no visualization of the cystic duct or any of the biliary tree was seen consistent with persistent obstruction.  His drain was left to the drainage bag.  He is encouraged to continue his routine care with drain flushes and documenting his output daily.  We will have him return to see the drain clinic for a repeat injection in 2 more weeks, between the 5-6 week time period where hopefully his drain can be capped if he has not had surgical intervention by that time, which is unlikely.  Signed: Henreitta Cea, PA-C 10/30/2017, 11:34 AM   Please refer to Dr. Pasty Arch attestation of this note for management and plan.

## 2017-11-04 ENCOUNTER — Other Ambulatory Visit: Payer: Self-pay | Admitting: General Surgery

## 2017-11-04 DIAGNOSIS — N179 Acute kidney failure, unspecified: Secondary | ICD-10-CM | POA: Diagnosis not present

## 2017-11-04 DIAGNOSIS — K810 Acute cholecystitis: Secondary | ICD-10-CM | POA: Diagnosis not present

## 2017-11-04 DIAGNOSIS — Z6834 Body mass index (BMI) 34.0-34.9, adult: Secondary | ICD-10-CM | POA: Diagnosis not present

## 2017-11-04 DIAGNOSIS — J9601 Acute respiratory failure with hypoxia: Secondary | ICD-10-CM | POA: Diagnosis not present

## 2017-11-04 DIAGNOSIS — A4150 Gram-negative sepsis, unspecified: Secondary | ICD-10-CM | POA: Diagnosis not present

## 2017-11-04 DIAGNOSIS — I10 Essential (primary) hypertension: Secondary | ICD-10-CM | POA: Diagnosis not present

## 2017-11-04 DIAGNOSIS — E119 Type 2 diabetes mellitus without complications: Secondary | ICD-10-CM | POA: Diagnosis not present

## 2017-11-06 ENCOUNTER — Encounter: Payer: Self-pay | Admitting: Family Medicine

## 2017-11-06 DIAGNOSIS — R69 Illness, unspecified: Secondary | ICD-10-CM | POA: Diagnosis not present

## 2017-11-06 DIAGNOSIS — G4730 Sleep apnea, unspecified: Secondary | ICD-10-CM | POA: Diagnosis not present

## 2017-11-06 DIAGNOSIS — E119 Type 2 diabetes mellitus without complications: Secondary | ICD-10-CM | POA: Diagnosis not present

## 2017-11-06 DIAGNOSIS — Z87891 Personal history of nicotine dependence: Secondary | ICD-10-CM | POA: Diagnosis not present

## 2017-11-06 DIAGNOSIS — Z7984 Long term (current) use of oral hypoglycemic drugs: Secondary | ICD-10-CM | POA: Diagnosis not present

## 2017-11-06 DIAGNOSIS — Z4803 Encounter for change or removal of drains: Secondary | ICD-10-CM | POA: Diagnosis not present

## 2017-11-06 DIAGNOSIS — K8309 Other cholangitis: Secondary | ICD-10-CM | POA: Diagnosis not present

## 2017-11-06 DIAGNOSIS — I10 Essential (primary) hypertension: Secondary | ICD-10-CM | POA: Diagnosis not present

## 2017-11-06 DIAGNOSIS — Z7982 Long term (current) use of aspirin: Secondary | ICD-10-CM | POA: Diagnosis not present

## 2017-11-08 ENCOUNTER — Other Ambulatory Visit: Payer: Self-pay | Admitting: Family Medicine

## 2017-11-08 ENCOUNTER — Other Ambulatory Visit (INDEPENDENT_AMBULATORY_CARE_PROVIDER_SITE_OTHER): Payer: No Typology Code available for payment source

## 2017-11-08 DIAGNOSIS — D649 Anemia, unspecified: Secondary | ICD-10-CM

## 2017-11-08 DIAGNOSIS — N289 Disorder of kidney and ureter, unspecified: Secondary | ICD-10-CM

## 2017-11-08 NOTE — Telephone Encounter (Addendum)
Called him- he will go back on benazapril now.  Was taking 20 mg 2 pills daily.  He will take 1 pill today, and then go to 2 tomorrow.  He did get his blood drawn today- will look for this and can get him back on hctz once his kidneys are back to normal He is also taking labetolol 100 BID- will have him taper off this and also off the hydralazine and back onto his regular meds as his renal function allows   He will keep me closely apprised of his progress over mychart

## 2017-11-09 ENCOUNTER — Encounter: Payer: Self-pay | Admitting: Family Medicine

## 2017-11-09 LAB — BASIC METABOLIC PANEL
BUN: 21 mg/dL (ref 7–25)
CHLORIDE: 109 mmol/L (ref 98–110)
CO2: 21 mmol/L (ref 20–32)
CREATININE: 1.21 mg/dL (ref 0.70–1.25)
Calcium: 9.8 mg/dL (ref 8.6–10.3)
GLUCOSE: 120 mg/dL — AB (ref 65–99)
Potassium: 5.2 mmol/L (ref 3.5–5.3)
SODIUM: 142 mmol/L (ref 135–146)

## 2017-11-09 LAB — CBC
HCT: 29.9 % — ABNORMAL LOW (ref 38.5–50.0)
HEMOGLOBIN: 10.1 g/dL — AB (ref 13.2–17.1)
MCH: 28.4 pg (ref 27.0–33.0)
MCHC: 33.8 g/dL (ref 32.0–36.0)
MCV: 84 fL (ref 80.0–100.0)
MPV: 12.2 fL (ref 7.5–12.5)
Platelets: 279 10*3/uL (ref 140–400)
RBC: 3.56 10*6/uL — ABNORMAL LOW (ref 4.20–5.80)
RDW: 13.1 % (ref 11.0–15.0)
WBC: 7.2 10*3/uL (ref 3.8–10.8)

## 2017-11-11 ENCOUNTER — Encounter: Payer: Self-pay | Admitting: Family Medicine

## 2017-11-12 DIAGNOSIS — Z7984 Long term (current) use of oral hypoglycemic drugs: Secondary | ICD-10-CM | POA: Diagnosis not present

## 2017-11-12 DIAGNOSIS — Z4803 Encounter for change or removal of drains: Secondary | ICD-10-CM | POA: Diagnosis not present

## 2017-11-12 DIAGNOSIS — Z87891 Personal history of nicotine dependence: Secondary | ICD-10-CM | POA: Diagnosis not present

## 2017-11-12 DIAGNOSIS — K8309 Other cholangitis: Secondary | ICD-10-CM | POA: Diagnosis not present

## 2017-11-12 DIAGNOSIS — G4730 Sleep apnea, unspecified: Secondary | ICD-10-CM | POA: Diagnosis not present

## 2017-11-12 DIAGNOSIS — R69 Illness, unspecified: Secondary | ICD-10-CM | POA: Diagnosis not present

## 2017-11-12 DIAGNOSIS — Z7982 Long term (current) use of aspirin: Secondary | ICD-10-CM | POA: Diagnosis not present

## 2017-11-12 DIAGNOSIS — I10 Essential (primary) hypertension: Secondary | ICD-10-CM | POA: Diagnosis not present

## 2017-11-12 DIAGNOSIS — E119 Type 2 diabetes mellitus without complications: Secondary | ICD-10-CM | POA: Diagnosis not present

## 2017-11-13 ENCOUNTER — Other Ambulatory Visit: Payer: No Typology Code available for payment source

## 2017-11-14 ENCOUNTER — Ambulatory Visit
Admission: RE | Admit: 2017-11-14 | Discharge: 2017-11-14 | Disposition: A | Discharge status: Home / Self Care | Payer: No Typology Code available for payment source | Source: Ambulatory Visit | Attending: General Surgery | Admitting: General Surgery

## 2017-11-14 ENCOUNTER — Encounter: Payer: Self-pay | Admitting: Radiology

## 2017-11-14 ENCOUNTER — Telehealth: Payer: Self-pay | Admitting: Family Medicine

## 2017-11-14 DIAGNOSIS — K8000 Calculus of gallbladder with acute cholecystitis without obstruction: Secondary | ICD-10-CM

## 2017-11-14 DIAGNOSIS — K810 Acute cholecystitis: Secondary | ICD-10-CM | POA: Diagnosis not present

## 2017-11-14 HISTORY — PX: IR RADIOLOGIST EVAL & MGMT: IMG5224

## 2017-11-14 NOTE — Telephone Encounter (Signed)
Copied from [facility]. Topic: Quick Communication - See Telephone Encounter >> Nov 14, 2017  8:59 AM Ivar Drape wrote: CRM for notification. See Telephone encounter for:  11/14/17. Amber Poplin a nurse w/[facility] [phone] needs verbal orders to continue her visits with the patient.  They would like to follow the patient until he has his surgery on 01/06/18 and afterwards when the incision is healed.

## 2017-11-14 NOTE — Telephone Encounter (Signed)
Forwarding to Timothy Chapman's CMA- he is covering for her today in her absence.

## 2017-11-14 NOTE — Progress Notes (Signed)
Patient ID: Timothy Chapman, male   DOB: 12-21-1945, 72 y.o.   MRN: [id number]       Chief Complaint: Chronic cholecystostomy   Referring Physician(s): Timothy Chapman,Timothy Chapman  History of Present Illness: Timothy Chapman is a 72 y.o. male with a chronic indwelling cholecystostomy placed 10/04/2017. He was admitted with elevated LFTs, renal failure, respiratory failure. His workup included acute cholecystitis. During the hospitalization he was unable to go oh cystectomy therefore the cholecystostomy was inserted. He returns today for outpatient follow-up and drain injection. Overall he is improved.  Past Medical History:  Diagnosis Date  . AKI (acute kidney injury) ([place])   . Anxiety   . Arthritis   . Depression   . Diabetes mellitus without complication ([place])   . GERD (gastroesophageal reflux disease)   . Hypertension   . Neuromuscular disorder ([place])   . Sleep apnea     Past Surgical History:  Procedure Laterality Date  . BACK SURGERY     X2  . IR RADIOLOGIST EVAL & MGMT  10/30/2017  . IR RADIOLOGIST EVAL & MGMT  11/14/2017  . REPLACEMENT TOTAL KNEE Left   . ROTATOR CUFF REPAIR      Allergies: Codeine  Medications: Prior to Admission medications   Medication Sig Start Date End Date Taking? Authorizing Provider  Acetylcarnitine HCl 500 MG CAPS Take 500 mg by mouth daily.    [provider]  Alcohol Swabs (ALCOHOL PREP) PADS Use prior to insulin dose 08/22/17   Copland, Gay Filler, MD  Alpha-Lipoic Acid 600 MG CAPS Take 600 mg by mouth daily.    [provider]  aspirin 81 MG chewable tablet Chew 1 tablet (81 mg total) by mouth daily. 10/12/17   Marene Lenz, MD  diclofenac (VOLTAREN) 75 MG EC tablet Take 1 tablet (75 mg total) by mouth 2 (two) times daily. Use as needed for joint pains 07/19/17   Copland, Gay Filler, MD  famotidine (PEPCID) 20 MG tablet Take 1 tablet (20 mg total) by mouth 2 (two) times daily. 10/11/17   Marene Lenz, MD  glipiZIDE  (GLUCOTROL) 10 MG tablet Take 1 tablet (10 mg total) by mouth 2 (two) times daily before a meal. 07/08/17   Copland, Gay Filler, MD  glucose blood test strip Use as instructed 06/28/16   Copland, Gay Filler, MD  hydrALAZINE (APRESOLINE) 50 MG tablet Take 1 tablet (50 mg total) by mouth every 8 (eight) hours. 10/11/17 11/10/17  Marene Lenz, MD  Insulin Pen Needle (PEN NEEDLES) 31G X 5 MM MISC 1 each by Does not apply route daily. 08/22/17   Copland, Gay Filler, MD  labetalol (NORMODYNE) 100 MG tablet Take 1 tablet (100 mg total) by mouth 2 (two) times daily. 10/11/17 11/10/17  Marene Lenz, MD  levofloxacin (LEVAQUIN) 750 MG tablet Take 1 pill by mouth every other day for 5 doses 10/12/17   Copland, Gay Filler, MD  liraglutide (VICTOZA) 18 MG/3ML SOPN Inject 1.8 mg [place] daily Patient not taking: Reported on 10/23/2017 07/08/17   Copland, Gay Filler, MD  Melatonin 3 MG TABS Take 1 tablet by mouth at bedtime.     [provider]  metFORMIN (GLUCOPHAGE) 1000 MG tablet Take 1 tablet (1,000 mg total) by mouth 2 (two) times daily with a meal. 07/08/17   Copland, Gay Filler, MD  Multiple Vitamin (MULTIVITAMIN) tablet Take 1 tablet by mouth daily.    [provider]  niacin (NIASPAN) 500 MG CR tablet Take 1 tablet (500  mg total) by mouth at bedtime. 02/08/16   Copland, Gay Filler, MD  omeprazole (PRILOSEC) 40 MG capsule Take 1 capsule (40 mg total) by mouth daily. Patient taking differently: Take 40 mg by mouth daily as needed (heartburn).  07/08/17   Copland, Gay Filler, MD  tadalafil (CIALIS) 5 MG tablet Take 1 tablet (5 mg total) daily as needed by mouth for erectile dysfunction (and BPH). 07/24/17   Copland, Gay Filler, MD  Turmeric 450 MG CAPS Take 1 tablet by mouth daily.     [provider]     Family History  Problem Relation Age of Onset  . Heart failure Father   . Heart disease Father   . Emphysema Mother     Social History   Socioeconomic History  . Marital status:  Divorced    Spouse name: Not on file  . Number of children: Not on file  . Years of education: Not on file  . Highest education level: Not on file  Social Needs  . Financial resource strain: Not on file  . Food insecurity - worry: Not on file  . Food insecurity - inability: Not on file  . Transportation needs - medical: Not on file  . Transportation needs - non-medical: Not on file  Occupational History  . Not on file  Tobacco Use  . Smoking status: Former Smoker    Last attempt to quit: 09/18/1975    Years since quitting: 42.1  . Smokeless tobacco: Never Used  Substance and Sexual Activity  . Alcohol use: Yes    Alcohol/week: 0.0 oz    Comment: Rarely  . Drug use: Not on file  . Sexual activity: Not on file  Other Topics Concern  . Not on file  Social History Narrative  . Not on file      Review of Systems: A 12 point ROS discussed and pertinent positives are indicated in the HPI above.  All other systems are negative.  Review of Systems  Vital Signs: BP (!) 204/95   Pulse 70   Temp 98.2 F (36.8 C)   SpO2 99%   Physical Exam  Constitutional: He appears well-developed and well-nourished. No distress.  Eyes: Conjunctivae are normal. No scleral icterus.  Abdominal: Soft. Bowel sounds are normal. He exhibits no distension.  Cholecystostomy site clean, dry and intact. No signs of infection.  Skin: He is not diaphoretic.     Imaging: Dg Chest 2 View  Result Date: 10/23/2017 CLINICAL DATA:  Cough and congestion EXAM: CHEST  2 VIEW COMPARISON:  October 08, 2017 FINDINGS: There is no edema or consolidation. The heart size and pulmonary vascularity are normal. No adenopathy. There is degenerative change in the lower thoracic spine. There is a drain in the right abdominal region. IMPRESSION: No edema or consolidation. Electronically Signed   By: Lowella Grip III M.D.   On: 10/23/2017 13:24   Dg Cholangiogram  Existing Tube  Result Date: 11/14/2017 INDICATION:  Cholecystostomy EXAM: FLUOROSCOPIC INJECTION OF THE EXISTING CHOLECYSTOSTOMY MEDICATIONS: NONE. ANESTHESIA/SEDATION: NONE. FLUOROSCOPY TIME:  Fluoroscopy Time: 18 seconds (20 mGy). COMPLICATIONS: None immediate. PROCEDURE: Existing cholecystostomy was injected under fluoroscopy. Cholecystostomy is well positioned within the gallbladder. Cystic duct remains obstructed. Filling defects noted compatible with cholelithiasis. IMPRESSION: Stable cholecystostomy catheter position. Cystic duct remains obstructed. Catheter will remain to gravity drainage. PLAN: Patient has elective cholecystectomy scheduled 12/06/2017. Electronically Signed   By: Jerilynn Mages.  Coreyon Nicotra M.D.   On: 11/14/2017 12:48   Dg Cholangiogram  Existing Tube  Result Date: 10/30/2017 INDICATION: 72 year old male with a history of acute cholecystitis. Drainage performed with percutaneous cholecystostomy 10/04/2017 EXAM: IMAGE GUIDED TUBE INJECTION MEDICATIONS: None ANESTHESIA/SEDATION: None COMPLICATIONS: None PROCEDURE: Informed written consent was obtained from the patient after a thorough discussion of the procedural risks, benefits and alternatives. All questions were addressed. Maximal Sterile Barrier Technique was utilized including caps, mask, sterile gowns, sterile gloves, sterile drape, hand hygiene and skin antiseptic. A timeout was performed prior to the initiation of the procedure. Scout images were acquired. Contrast injection was performed confirming location of the tube within the gallbladder. Cystic duct remains occluded. Catheter was flushed and attached to gravity drainage. IMPRESSION: Status post injection of existing percutaneous cholecystostomy. Cystic duct remains occluded, and the catheter should remain to gravity drain. Signed, Dulcy Fanny. Earleen Newport, DO [facility] PLAN: The patient should observe his future appointments with surgery, to determine if the patient is a candidate for  interval cholecystectomy. The drain may be exchanged routinely, starting at 6 weeks, if need be. Electronically Signed   By: Corrie Mckusick D.O.   On: 10/30/2017 12:00   Ir Radiologist Eval & Mgmt  Result Date: 11/14/2017 Please refer to notes tab for details about interventional procedure. (Op Note)  Ir Radiologist Eval & Mgmt  Result Date: 10/30/2017 Please refer to notes tab for details about interventional procedure. (Op Note)   Labs:  CBC: Recent Labs    10/08/17 0257 10/10/17 0331 10/23/17 1258 11/08/17 1333  WBC 11.2* 9.7 9.2 7.2  HGB 11.4* 9.4* 10.2* 10.1*  HCT 34.6* 28.1* 31.5* 29.9*  PLT 385 413* 421.0* 279    COAGS: Recent Labs    10/04/17 0412  INR 1.04    BMP: Recent Labs    10/08/17 0257 10/09/17 0307 10/10/17 0331 10/11/17 0652 10/23/17 1258 11/08/17 1333  NA 141 137 137 137 137 142  K 3.4* 3.6 3.2* 3.4* 4.9 5.2  CL 115* 110 109 108 101 109  CO2 20* 19* 20* 21* 28 21  GLUCOSE 168* 152* 151* 179* 121* 120*  BUN 40* 35* 33* 29* 24* 21  CALCIUM 8.5* 8.2* 8.1* 8.4* 9.6 9.8  CREATININE 1.88* 1.75* 1.67* 1.71* 1.77* 1.21  GFRNONAA 35* 38* 40* 39*  --   --   GFRAA 40* 44* 47* 45*  --   --     LIVER FUNCTION TESTS: Recent Labs    10/06/17 0216 10/07/17 1304 10/09/17 0307 10/23/17 1258  BILITOT 3.6* 2.0* 1.4* 0.6  AST 83* 30 30 18   ALT 144* 75* 49 22  ALKPHOS 123 96 70 49  PROT 6.1* 6.3* 5.6* 7.8  ALBUMIN 2.3* 2.2* 1.9* 3.8     Assessment and Plan:  Acute cholecystitis, status post percutaneous cholecystostomy 10/04/2017. Repeat cholangiogram through the catheter today confirms persistent cystic duct obstruction. Therefore the drain catheter cannot be removed. Drain catheter will be left to gravity drainage until surgery.  Plan: Patient is scheduled for elective cholecystectomy 12/06/2017 with Dr. Dalbert Batman.  Electronically Signed: Greggory Keen 11/14/2017, 12:58 PM   I spent a total of    15 Minutes in face to face in clinical  consultation, greater than 50% of which was counseling/coordinating care for this patient with cholecystitis status post cholecystostomy.

## 2017-11-19 NOTE — Progress Notes (Signed)
[place] at [facility] [address] [place], [place] [address] [phone]  Date:  11/20/2017   Name:  Timothy Chapman [facility]   DOB:  Apr 17, 1946   MRN:  [id number]  PCP:  [name], MD    Chief Complaint: Hypertension (c/o bp readings as high as 218/85 about two weeks ago)   History of Present Illness:  Timothy Chapman is a 72 y.o. very pleasant male patient who presents with the following:  Following up again from recent hospital stay- I saw him about a month ago shortly after his discharge home:  Here today to follow-up from recent long hospital stay for cholangitis and also acute resp failure with intubation He is overall much better He seems to be retaining fluid- will obtain CXR and also check BNP for any evidence of CHF A1c today He will watch for any fever or other sign of worsening and seek help if these occur  BP Readings from Last 3 Encounters:  11/20/17 (!) 164/84  11/14/17 (!) 204/95  10/30/17 (!) 199/98   His BP meds were changed during his inpt stay due to ARI- we have been working on getting him back on a good regimen.  See several phone and mychart messages over the last 2 weeks  He is taking currently:  amlodipine 5mg  benazapril 40 mg today He is back on hctz 25  He stopped hydralazine and labetolol - no longer taking either at all  His breathing is pretty good He has noted occasional headache His energy level is still quite low  He has a lap chole planned for 3/22 per Dr. Dalbert Batman- they also plan an intraoperative cholangiogram and will take out his drain  He is eating pretty well again- not as much as he used to   He is still taking pepcid at night for GERD sx  He went to the [place] ophthalmologist recently and was told he has some changes to the back of the eye- ?hypertensive retinopathy.  They plan to treat this with an intraorbital injection which we hope will help with his vision.  We are working on  getting his BP controlled again. Not dangerously high today but still above goal   Lab Results  Component Value Date   HGBA1C 6.8 (H) 10/23/2017   His glucose had been well controlled recently.  Went to 255 2 days ago, this was unusual.    He is back on his glipizide 10 BID, metformin 100 BID- he is not back on victoza yet.  He will watch his glucose and add this back when it seems necessary   Wt Readings from Last 3 Encounters:  11/20/17 236 lb 12.8 oz (107.4 kg)  10/23/17 239 lb (108.4 kg)  10/08/17 243 lb 6.2 oz (110.4 kg)    Patient Active Problem List   Diagnosis Date Noted  . Acute cholangitis 10/08/2017  . SBO (small bowel obstruction) ([place]) 10/08/2017  . ATN (acute tubular necrosis) ([place]) 10/08/2017  . Septic shock ([place]) 10/04/2017  . Acute cholecystitis s/p perc cholecystomy drainage 10/04/2017   . Sepsis ([place])   . Acute respiratory failure with hypoxia ([place])   . AKI (acute kidney injury) ([place])   . Abdominal pain 10/02/2017  . Chronic back pain 02/08/2016  . Controlled type 2 diabetes mellitus with diabetic neuropathy, without long-term current use of insulin ([place]) 02/08/2016  . Insomnia 02/08/2016  . History of diverticulitis 02/08/2016  . HYPERTENSION, BENIGN ESSENTIAL,  UNCONTROLLED 03/12/2009  . ABNORMAL ELECTROCARDIOGRAM 03/12/2009    Past Medical History:  Diagnosis Date  . AKI (acute kidney injury) ([place])   . Anxiety   . Arthritis   . Depression   . Diabetes mellitus without complication ([place])   . GERD (gastroesophageal reflux disease)   . Hypertension   . Neuromuscular disorder ([place])   . Sleep apnea     Past Surgical History:  Procedure Laterality Date  . BACK SURGERY     X2  . IR RADIOLOGIST EVAL & MGMT  10/30/2017  . IR RADIOLOGIST EVAL & MGMT  11/14/2017  . REPLACEMENT TOTAL KNEE Left   . ROTATOR CUFF REPAIR      Social History   Tobacco Use  . Smoking status: Former Smoker    Last attempt to quit: 09/18/1975    Years since quitting: 42.2   . Smokeless tobacco: Never Used  Substance Use Topics  . Alcohol use: Yes    Alcohol/week: 0.0 oz    Comment: Rarely  . Drug use: Not on file    Family History  Problem Relation Age of Onset  . Heart failure Father   . Heart disease Father   . Emphysema Mother     Allergies  Allergen Reactions  . Codeine     Medication list has been reviewed and updated.  Current Outpatient Medications on File Prior to Visit  Medication Sig Dispense Refill  . Acetylcarnitine HCl 500 MG CAPS Take 500 mg by mouth daily.    . Alcohol Swabs (ALCOHOL PREP) PADS Use prior to insulin dose 200 each prn  . Alpha-Lipoic Acid 600 MG CAPS Take 600 mg by mouth daily.    Marland Kitchen aspirin 81 MG chewable tablet Chew 1 tablet (81 mg total) by mouth daily. 30 tablet 0  . diclofenac (VOLTAREN) 75 MG EC tablet Take 1 tablet (75 mg total) by mouth 2 (two) times daily. Use as needed for joint pains 180 tablet 3  . famotidine (PEPCID) 20 MG tablet Take 1 tablet (20 mg total) by mouth 2 (two) times daily. 30 tablet 0  . glipiZIDE (GLUCOTROL) 10 MG tablet Take 1 tablet (10 mg total) by mouth 2 (two) times daily before a meal. 180 tablet 3  . glucose blood test strip Use as instructed 100 each 12  . hydrALAZINE (APRESOLINE) 50 MG tablet Take 1 tablet (50 mg total) by mouth every 8 (eight) hours. 90 tablet 0  . Insulin Pen Needle (PEN NEEDLES) 31G X 5 MM MISC 1 each by Does not apply route daily. 100 each prn  . labetalol (NORMODYNE) 100 MG tablet Take 1 tablet (100 mg total) by mouth 2 (two) times daily. 60 tablet 0  . levofloxacin (LEVAQUIN) 750 MG tablet Take 1 pill by mouth every other day for 5 doses 5 tablet 0  . liraglutide (VICTOZA) 18 MG/3ML SOPN Inject 1.8 mg [place] daily (Patient not taking: Reported on 10/23/2017) 9 pen 3  . Melatonin 3 MG TABS Take 1 tablet by mouth at bedtime.     . metFORMIN (GLUCOPHAGE) 1000 MG tablet Take 1 tablet (1,000 mg total) by mouth 2 (two) times daily with a meal. 180 tablet 3  . Multiple  Vitamin (MULTIVITAMIN) tablet Take 1 tablet by mouth daily.    . niacin (NIASPAN) 500 MG CR tablet Take 1 tablet (500 mg total) by mouth at bedtime. 30 tablet 6  . omeprazole (PRILOSEC) 40 MG capsule Take 1 capsule (40 mg total) by mouth daily. (Patient taking  differently: Take 40 mg by mouth daily as needed (heartburn). ) 90 capsule 3  . tadalafil (CIALIS) 5 MG tablet Take 1 tablet (5 mg total) daily as needed by mouth for erectile dysfunction (and BPH). 90 tablet 3  . Turmeric 450 MG CAPS Take 1 tablet by mouth daily.      No current facility-administered medications on file prior to visit.     Review of Systems:  As per HPI- otherwise negative.   Physical Examination: Vitals:   11/20/17 1239  BP: (!) 164/84  Pulse: 87  Temp: 97.7 F (36.5 C)  SpO2: 98%   Vitals:   11/20/17 1239  Weight: 236 lb 12.8 oz (107.4 kg)  Height: 5\' 9"  (1.753 m)   Body mass index is 34.97 kg/m. Ideal Body Weight: Weight in (lb) to have BMI = 25: 168.9  GEN: WDWN, NAD, Non-toxic, A & O x 3, obese, looks much better than at last visit HEENT: Atraumatic, Normocephalic. Neck supple. No masses, No LAD. Ears and Nose: No external deformity. CV: RRR, No M/G/R. No JVD. No thrill. No extra heart sounds. PULM: CTA B, no wheezes, crackles, rhonchi. No retractions. No resp. distress. No accessory muscle use. ABD: S, NT, ND, +BS. No rebound. No HSM.  Still have RUQ drain in place, bandage looks clean  EXTR: No c/c/e NEURO Normal gait.  PSYCH: Normally interactive. Conversant. Not depressed or anxious appearing.  Calm demeanor.    Assessment and Plan: Essential hypertension - Plan: amLODipine (NORVASC) 5 MG tablet, CBC, benazepril (LOTENSIN) 40 MG tablet  Controlled type 2 diabetes mellitus with diabetic neuropathy, without long-term current use of insulin (HCC) - Plan: CBC, Comprehensive metabolic panel  Acute renal insufficiency - Plan: Comprehensive metabolic panel  He is back on hctz 25 and  benazepril 40, amlodipine 5 mg.  DBP is ok, but SBP is still high Will increase amlodipine to 7.5, and he will let me know how this does for him.  We can increase to 10 mg if needed- will have to reconsider if he has swelling again.  However right now swelling is not a concern He is otherwise doing well after his recent serious illness and is getting his strength back Will check labs today- need to monitor his K and his renal function  Signed Lamar Blinks, MD Lab message to pt  Your kidney function looks fine, as does your liver function.  Your red cells counts are getting back to normal.  I had forgotten to ask- are you taking lasix [facility]urosemide[facility] at this [facility]?  If so what dose?  Please keep me closely apprised of your blood pressure progress  Results for orders placed or performed in visit on 11/20/17  CBC  Result Value Ref Range   WBC 7.5 4.0 - 10.5 K/uL   RBC 3.99 (L) 4.22 - 5.81 Mil/uL   Platelets 285.0 150.0 - 400.0 K/uL   Hemoglobin 11.4 (L) 13.0 - 17.0 g/dL   HCT 34.0 (L) 39.0 - 52.0 %   MCV 85.3 78.0 - 100.0 fl   MCHC 33.6 30.0 - 36.0 g/dL   RDW 14.8 11.5 - 15.5 %  Comprehensive metabolic panel  Result Value Ref Range   Sodium 136 135 - 145 mEq/L   Potassium 4.5 3.5 - 5.1 mEq/L   Chloride 100 96 - 112 mEq/L   CO2 28 19 - 32 mEq/L   Glucose, Bld 140 (H) 70 - 99 mg/dL   BUN 22 6 - 23 mg/dL  Creatinine, Ser 1.04 0.40 - 1.50 mg/dL   Total Bilirubin 0.4 0.2 - 1.2 mg/dL   Alkaline Phosphatase 37 (L) 39 - 117 U/L   AST 21 0 - 37 U/L   ALT 29 0 - 53 U/L   Total Protein 8.0 6.0 - 8.3 g/dL   Albumin 4.4 3.5 - 5.2 g/dL   Calcium 10.4 8.4 - 10.5 mg/dL   GFR 75.19 >60.00 mL/min

## 2017-11-20 ENCOUNTER — Encounter: Payer: Self-pay | Admitting: Family Medicine

## 2017-11-20 ENCOUNTER — Ambulatory Visit (INDEPENDENT_AMBULATORY_CARE_PROVIDER_SITE_OTHER): Payer: Medicare HMO | Admitting: Family Medicine

## 2017-11-20 VITALS — BP 164/84 | HR 87 | Temp 97.7°F | Ht 69.0 in | Wt 236.8 lb

## 2017-11-20 DIAGNOSIS — N289 Disorder of kidney and ureter, unspecified: Secondary | ICD-10-CM

## 2017-11-20 DIAGNOSIS — I10 Essential (primary) hypertension: Secondary | ICD-10-CM | POA: Diagnosis not present

## 2017-11-20 DIAGNOSIS — E1140 Type 2 diabetes mellitus with diabetic neuropathy, unspecified: Secondary | ICD-10-CM

## 2017-11-20 LAB — COMPREHENSIVE METABOLIC PANEL
ALT: 29 U/L (ref 0–53)
AST: 21 U/L (ref 0–37)
Albumin: 4.4 g/dL (ref 3.5–5.2)
Alkaline Phosphatase: 37 U/L — ABNORMAL LOW (ref 39–117)
BUN: 22 mg/dL (ref 6–23)
CHLORIDE: 100 meq/L (ref 96–112)
CO2: 28 meq/L (ref 19–32)
CREATININE: 1.04 mg/dL (ref 0.40–1.50)
Calcium: 10.4 mg/dL (ref 8.4–10.5)
GFR: 75.19 mL/min (ref >60.00)
Glucose, Bld: 140 mg/dL — ABNORMAL HIGH (ref 70–99)
POTASSIUM: 4.5 meq/L (ref 3.5–5.1)
Sodium: 136 mEq/L (ref 135–145)
Total Bilirubin: 0.4 mg/dL (ref 0.2–1.2)
Total Protein: 8 g/dL (ref 6.0–8.3)

## 2017-11-20 LAB — CBC
HEMATOCRIT: 34 % — AB (ref 39.0–52.0)
Hemoglobin: 11.4 g/dL — ABNORMAL LOW (ref 13.0–17.0)
MCHC: 33.6 g/dL (ref 30.0–36.0)
MCV: 85.3 fl (ref 78.0–100.0)
Platelets: 285 10*3/uL (ref 150.0–400.0)
RBC: 3.99 Mil/uL — ABNORMAL LOW (ref 4.22–5.81)
RDW: 14.8 % (ref 11.5–15.5)
WBC: 7.5 10*3/uL (ref 4.0–10.5)

## 2017-11-20 MED ORDER — HYDROCHLOROTHIAZIDE 25 MG PO TABS: 25.0000 mg | ORAL_TABLET | Freq: Every day | ORAL | 3 refills | Status: DC

## 2017-11-20 MED ORDER — AMLODIPINE BESYLATE 5 MG PO TABS: ORAL_TABLET | ORAL | 5 refills | Status: DC

## 2017-11-20 MED ORDER — BENAZEPRIL HCL 40 MG PO TABS: 40.0000 mg | ORAL_TABLET | Freq: Every day | ORAL | 3 refills | Status: DC

## 2017-11-20 MED FILL — AMLODIPINE BESYLATE 5 MG TA: 5 | 30 days supply | Qty: 60 | Fill #0

## 2017-11-20 NOTE — Patient Instructions (Signed)
Increase your amlodipine to 7.5 mg a day- please let me know how this works, we can go to 10 mg if need be.  Other considerations would be to increase benazepril to 80 mg, or add back labetolol

## 2017-11-20 NOTE — Telephone Encounter (Signed)
Called and gave VO to [employer]

## 2017-11-22 DIAGNOSIS — Z87891 Personal history of nicotine dependence: Secondary | ICD-10-CM | POA: Diagnosis not present

## 2017-11-22 DIAGNOSIS — E119 Type 2 diabetes mellitus without complications: Secondary | ICD-10-CM | POA: Diagnosis not present

## 2017-11-22 DIAGNOSIS — K8309 Other cholangitis: Secondary | ICD-10-CM | POA: Diagnosis not present

## 2017-11-22 DIAGNOSIS — I10 Essential (primary) hypertension: Secondary | ICD-10-CM | POA: Diagnosis not present

## 2017-11-22 DIAGNOSIS — R69 Illness, unspecified: Secondary | ICD-10-CM | POA: Diagnosis not present

## 2017-11-22 DIAGNOSIS — Z4803 Encounter for change or removal of drains: Secondary | ICD-10-CM | POA: Diagnosis not present

## 2017-11-22 DIAGNOSIS — Z7982 Long term (current) use of aspirin: Secondary | ICD-10-CM | POA: Diagnosis not present

## 2017-11-22 DIAGNOSIS — G4730 Sleep apnea, unspecified: Secondary | ICD-10-CM | POA: Diagnosis not present

## 2017-11-22 DIAGNOSIS — Z7984 Long term (current) use of oral hypoglycemic drugs: Secondary | ICD-10-CM | POA: Diagnosis not present

## 2017-11-27 ENCOUNTER — Telehealth: Payer: Self-pay | Admitting: *Deleted

## 2017-11-27 DIAGNOSIS — R69 Illness, unspecified: Secondary | ICD-10-CM | POA: Diagnosis not present

## 2017-11-27 DIAGNOSIS — E119 Type 2 diabetes mellitus without complications: Secondary | ICD-10-CM | POA: Diagnosis not present

## 2017-11-27 DIAGNOSIS — Z87891 Personal history of nicotine dependence: Secondary | ICD-10-CM | POA: Diagnosis not present

## 2017-11-27 DIAGNOSIS — I10 Essential (primary) hypertension: Secondary | ICD-10-CM | POA: Diagnosis not present

## 2017-11-27 DIAGNOSIS — Z7984 Long term (current) use of oral hypoglycemic drugs: Secondary | ICD-10-CM | POA: Diagnosis not present

## 2017-11-27 DIAGNOSIS — Z4803 Encounter for change or removal of drains: Secondary | ICD-10-CM | POA: Diagnosis not present

## 2017-11-27 DIAGNOSIS — Z7982 Long term (current) use of aspirin: Secondary | ICD-10-CM | POA: Diagnosis not present

## 2017-11-27 DIAGNOSIS — K8309 Other cholangitis: Secondary | ICD-10-CM | POA: Diagnosis not present

## 2017-11-27 DIAGNOSIS — G4730 Sleep apnea, unspecified: Secondary | ICD-10-CM | POA: Diagnosis not present

## 2017-11-27 NOTE — Telephone Encounter (Signed)
Received Physician Orders from [facility]; forwarded to provider/SLS 03/13  

## 2017-11-28 ENCOUNTER — Inpatient Hospital Stay (HOSPITAL_COMMUNITY): Admission: RE | Admit: 2017-11-28 | Payer: No Typology Code available for payment source | Source: Ambulatory Visit

## 2017-12-06 DIAGNOSIS — Z7984 Long term (current) use of oral hypoglycemic drugs: Secondary | ICD-10-CM | POA: Diagnosis not present

## 2017-12-06 DIAGNOSIS — G4730 Sleep apnea, unspecified: Secondary | ICD-10-CM | POA: Diagnosis not present

## 2017-12-06 DIAGNOSIS — Z4803 Encounter for change or removal of drains: Secondary | ICD-10-CM | POA: Diagnosis not present

## 2017-12-06 DIAGNOSIS — R69 Illness, unspecified: Secondary | ICD-10-CM | POA: Diagnosis not present

## 2017-12-06 DIAGNOSIS — E119 Type 2 diabetes mellitus without complications: Secondary | ICD-10-CM | POA: Diagnosis not present

## 2017-12-06 DIAGNOSIS — Z87891 Personal history of nicotine dependence: Secondary | ICD-10-CM | POA: Diagnosis not present

## 2017-12-06 DIAGNOSIS — I10 Essential (primary) hypertension: Secondary | ICD-10-CM | POA: Diagnosis not present

## 2017-12-06 DIAGNOSIS — Z7982 Long term (current) use of aspirin: Secondary | ICD-10-CM | POA: Diagnosis not present

## 2017-12-06 DIAGNOSIS — K8309 Other cholangitis: Secondary | ICD-10-CM | POA: Diagnosis not present

## 2017-12-09 ENCOUNTER — Telehealth: Payer: Self-pay

## 2017-12-09 NOTE — Telephone Encounter (Signed)
Called and gave VO- this is certainly ok with me

## 2017-12-09 NOTE — Telephone Encounter (Signed)
Copied from [place]. Topic: Quick Communication - See Telephone Encounter >> Dec 09, 2017 12:54 PM Antonieta Iba C wrote: CRM for notification. See Telephone encounter for: 12/09/17.   Amber w/ [facility] called in to receive verbal orders to continue to work with pt once a week.  CB: [phone]

## 2017-12-11 NOTE — Pre-Procedure Instructions (Signed)
Timothy Chapman  12/11/2017      [facility] - [place], [place] - [address] [place] [address] Phone: [phone] Fax: [phone]    Your procedure is scheduled on April 5  Report to [place] at [place].M.  Call this number if you have problems the morning of surgery:  [phone]   Remember:  Do not eat food or drink liquids after midnight.  Continue all medications as directed by your physician except follow these medication instructions before surgery below   Take these medicines the morning of surgery with A SIP OF WATER  amLODipine (NORVASC) famotidine (PEPCID) omeprazole (PRILOSEC)   7 days prior to surgery STOP taking any Aspirin(unless otherwise instructed by your surgeon), Aleve, Naproxen, Ibuprofen, Motrin, Advil, Goody's, BC's, all herbal medications, fish oil, and all vitamins, diclofenac (VOLTAREN)   WHAT DO I DO ABOUT MY DIABETES MEDICATION?   Marland Kitchen Do not take oral diabetes medicines (pills) the morning of surgery. glipiZIDE (GLUCOTROL), metFORMIN (GLUCOPHAGE)   . The day of surgery, do not take other diabetes injectables, including Byetta (exenatide), Bydureon (exenatide ER), Victoza (liraglutide), or Trulicity (dulaglutide).  . If your CBG is greater than 220 mg/dL, you may take  of your sliding scale (correction) dose of insulin.   How to Manage Your Diabetes Before and After Surgery  Why is it important to control my blood sugar before and after surgery? . Improving blood sugar levels before and after surgery helps healing and can limit problems. . A way of improving blood sugar control is eating a healthy diet by: o  Eating less sugar and carbohydrates o  Increasing activity/exercise o  Talking with your doctor about reaching your blood sugar goals . High blood sugars (greater than 180 mg/dL) can raise your risk of infections and slow your recovery, so you will need to focus on  controlling your diabetes during the weeks before surgery. . Make sure that the doctor who takes care of your diabetes knows about your planned surgery including the date and location.  How do I manage my blood sugar before surgery? . Check your blood sugar at least 4 times a day, starting 2 days before surgery, to make sure that the level is not too high or low. o Check your blood sugar the morning of your surgery when you wake up and every 2 hours until you get to the Short Stay unit. . If your blood sugar is less than 70 mg/dL, you will need to treat for low blood sugar: o Do not take insulin. o Treat a low blood sugar (less than 70 mg/dL) with  cup of clear juice (cranberry or apple), 4 glucose tablets, OR glucose gel. o Recheck blood sugar in 15 minutes after treatment (to make sure it is greater than 70 mg/dL). If your blood sugar is not greater than 70 mg/dL on recheck, call [phone] for further instructions. . Report your blood sugar to the short stay nurse when you get to Short Stay.  . If you are admitted to the hospital after surgery: o Your blood sugar will be checked by the staff and you will probably be given insulin after surgery (instead of oral diabetes medicines) to make sure you have good blood sugar levels. o The goal for blood sugar control after surgery is 80-180 mg/dL.    Do not wear jewelry  Do not wear lotions, powders, or cologne, or deodorant.  Men  may shave face and neck.  Do not bring valuables to the hospital.  [facility] is not responsible for any belongings or valuables.  Contacts, dentures or bridgework may not be worn into surgery.  Leave your suitcase in the car.  After surgery it may be brought to your room.  For patients admitted to the hospital, discharge time will be determined by your treatment team.  Patients discharged the day of surgery will not be allowed to drive home.    Special instructions:   [place]- Preparing For  Surgery  Before surgery, you can play an important role. Because skin is not sterile, your skin needs to be as free of germs as possible. You can reduce the number of germs on your skin by washing with CHG (chlorahexidine gluconate) Soap before surgery.  CHG is an antiseptic cleaner which kills germs and bonds with the skin to continue killing germs even after washing.  Please do not use if you have an allergy to CHG or antibacterial soaps. If your skin becomes reddened/irritated stop using the CHG.  Do not shave (including legs and underarms) for at least 48 hours prior to first CHG shower. It is OK to shave your face.  Please follow these instructions carefully.   1. Shower the NIGHT BEFORE SURGERY and the MORNING OF SURGERY with CHG.   2. If you chose to wash your hair, wash your hair first as usual with your normal shampoo.  3. After you shampoo, rinse your hair and body thoroughly to remove the shampoo.  4. Use CHG as you would any other liquid soap. You can apply CHG directly to the skin and wash gently with a scrungie or a clean washcloth.   5. Apply the CHG Soap to your body ONLY FROM THE NECK DOWN.  Do not use on open wounds or open sores. Avoid contact with your eyes, ears, mouth and genitals (private parts). Wash Face and genitals (private parts)  with your normal soap.  6. Wash thoroughly, paying special attention to the area where your surgery will be performed.  7. Thoroughly rinse your body with warm water from the neck down.  8. DO NOT shower/wash with your normal soap after using and rinsing off the CHG Soap.  9. Pat yourself dry with a CLEAN TOWEL.  10. Wear CLEAN PAJAMAS to bed the night before surgery, wear comfortable clothes the morning of surgery  11. Place CLEAN SHEETS on your bed the night of your first shower and DO NOT SLEEP WITH PETS.    Day of Surgery: Do not apply any deodorants/lotions. Please wear clean clothes to the hospital/surgery center.       Please read over the following fact sheets that you were given.

## 2017-12-12 ENCOUNTER — Telehealth: Payer: Self-pay | Admitting: Family Medicine

## 2017-12-12 ENCOUNTER — Encounter (HOSPITAL_COMMUNITY)
Admission: RE | Admit: 2017-12-12 | Discharge: 2017-12-12 | Disposition: A | Discharge status: Home / Self Care | Payer: Medicare HMO | Source: Ambulatory Visit | Attending: General Surgery | Admitting: General Surgery

## 2017-12-12 ENCOUNTER — Encounter (HOSPITAL_COMMUNITY): Payer: Self-pay

## 2017-12-12 ENCOUNTER — Other Ambulatory Visit: Payer: Self-pay

## 2017-12-12 DIAGNOSIS — K219 Gastro-esophageal reflux disease without esophagitis: Secondary | ICD-10-CM | POA: Insufficient documentation

## 2017-12-12 DIAGNOSIS — Z01818 Encounter for other preprocedural examination: Secondary | ICD-10-CM | POA: Diagnosis not present

## 2017-12-12 DIAGNOSIS — F419 Anxiety disorder, unspecified: Secondary | ICD-10-CM | POA: Diagnosis not present

## 2017-12-12 DIAGNOSIS — R69 Illness, unspecified: Secondary | ICD-10-CM | POA: Diagnosis not present

## 2017-12-12 DIAGNOSIS — M1990 Unspecified osteoarthritis, unspecified site: Secondary | ICD-10-CM | POA: Diagnosis not present

## 2017-12-12 DIAGNOSIS — E1140 Type 2 diabetes mellitus with diabetic neuropathy, unspecified: Secondary | ICD-10-CM | POA: Insufficient documentation

## 2017-12-12 DIAGNOSIS — Z87891 Personal history of nicotine dependence: Secondary | ICD-10-CM | POA: Insufficient documentation

## 2017-12-12 DIAGNOSIS — Z01812 Encounter for preprocedural laboratory examination: Secondary | ICD-10-CM | POA: Insufficient documentation

## 2017-12-12 DIAGNOSIS — I10 Essential (primary) hypertension: Secondary | ICD-10-CM | POA: Diagnosis not present

## 2017-12-12 DIAGNOSIS — Z7984 Long term (current) use of oral hypoglycemic drugs: Secondary | ICD-10-CM | POA: Insufficient documentation

## 2017-12-12 DIAGNOSIS — Z96652 Presence of left artificial knee joint: Secondary | ICD-10-CM | POA: Diagnosis not present

## 2017-12-12 DIAGNOSIS — Z79899 Other long term (current) drug therapy: Secondary | ICD-10-CM | POA: Insufficient documentation

## 2017-12-12 DIAGNOSIS — F329 Major depressive disorder, single episode, unspecified: Secondary | ICD-10-CM | POA: Insufficient documentation

## 2017-12-12 DIAGNOSIS — Z7982 Long term (current) use of aspirin: Secondary | ICD-10-CM | POA: Diagnosis not present

## 2017-12-12 DIAGNOSIS — Z8679 Personal history of other diseases of the circulatory system: Secondary | ICD-10-CM

## 2017-12-12 HISTORY — DX: Other specified postprocedural states: Z98.890

## 2017-12-12 HISTORY — DX: Barrett's esophagus without dysplasia: K22.70

## 2017-12-12 HISTORY — DX: Nausea with vomiting, unspecified: R11.2

## 2017-12-12 HISTORY — DX: Other specified postprocedural states: R11.2

## 2017-12-12 LAB — CBC WITH DIFFERENTIAL/PLATELET
BASOS PCT: 1 %
Basophils Absolute: 0.1 10*3/uL (ref 0.0–0.1)
EOS ABS: 1.3 10*3/uL — AB (ref 0.0–0.7)
Eosinophils Relative: 11 %
HCT: 35.8 % — ABNORMAL LOW (ref 39.0–52.0)
Hemoglobin: 12 g/dL — ABNORMAL LOW (ref 13.0–17.0)
Lymphocytes Relative: 22 %
Lymphs Abs: 2.8 10*3/uL (ref 0.7–4.0)
MCH: 28.3 pg (ref 26.0–34.0)
MCHC: 33.5 g/dL (ref 30.0–36.0)
MCV: 84.4 fL (ref 78.0–100.0)
MONO ABS: 0.6 10*3/uL (ref 0.1–1.0)
MONOS PCT: 5 %
Neutro Abs: 7.9 10*3/uL — ABNORMAL HIGH (ref 1.7–7.7)
Neutrophils Relative %: 61 %
PLATELETS: 230 10*3/uL (ref 150–400)
RBC: 4.24 MIL/uL (ref 4.22–5.81)
RDW: 14.2 % (ref 11.5–15.5)
WBC: 12.7 10*3/uL — ABNORMAL HIGH (ref 4.0–10.5)

## 2017-12-12 LAB — COMPREHENSIVE METABOLIC PANEL
ALBUMIN: 4.3 g/dL (ref 3.5–5.0)
ALT: 116 U/L — ABNORMAL HIGH (ref 17–63)
ANION GAP: 10 (ref 5–15)
AST: 61 U/L — ABNORMAL HIGH (ref 15–41)
Alkaline Phosphatase: 42 U/L (ref 38–126)
BILIRUBIN TOTAL: 1 mg/dL (ref 0.3–1.2)
BUN: 28 mg/dL — ABNORMAL HIGH (ref 6–20)
CO2: 19 mmol/L — AB (ref 22–32)
Calcium: 9.8 mg/dL (ref 8.9–10.3)
Chloride: 107 mmol/L (ref 101–111)
Creatinine, Ser: 1.21 mg/dL (ref 0.61–1.24)
GFR calc Af Amer: >60 mL/min (ref >60)
GFR calc non Af Amer: 59 mL/min — ABNORMAL LOW (ref >60)
GLUCOSE: 87 mg/dL (ref 65–99)
POTASSIUM: 5.3 mmol/L — AB (ref 3.5–5.1)
Sodium: 136 mmol/L (ref 135–145)
TOTAL PROTEIN: 8 g/dL (ref 6.5–8.1)

## 2017-12-12 LAB — GLUCOSE, CAPILLARY: Glucose-Capillary: 89 mg/dL (ref 65–99)

## 2017-12-12 NOTE — Progress Notes (Addendum)
Anesthesia Chart Review: Patient is a 72 year old male scheduled for laparoscopic cholecystectomy on 12/20/2017 by Dr. Fanny Skates.  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  - Hospitalized 10/02/17-10/11/17 with transaminitis, acute cholecystitis/cholangitis with septic shock, acute respiratory failure requiring intubation (10/04/17), AKI (Cr 3.42). S/p cholecystostomy drain 10/04/17 and antibiotic thearpy. Required NGT for ileus. He was started on hydralazine for uncontrolled hypertension. He developed afib with RVR on 10/04/17 when his status worsened and had to be intubated, transferred to the ICU and underwnet cholecystostomy drain. (I don't see a specific duration of afib, but was back in SR by 10/06/17. An echo was not done. Troponin 0.06->0.05->0.03->0.03 on 10/07/17. Cardiology did not see patient during hospitalization.    PCP is Dr. Janett Billow Copland. (He also receives some care through the [facility].) Last visit 10/23/17 for hospital follow-up. She is aware that surgery is planned. BNP and CXR checked due to "retaining fluid." CXR showed no edema. BNP was mildly elevated at 165.   Meds include amlodipine (7.5 mg prescribed, increase to 10 mg if needed; patient is only taking 5 mg daily), aspirin 81 mg (holding 5 days prior to surgery), benazepril 40 mg, Pepcid, glipizide, HCTZ 25 mg, Victoza (on hold), metformin, MVI, niacin Cialis, turmeric, vitamin C, melatonin, acetyl carnitine, alpha full lipoic acid. He is not taking HCTZ and benazepril consistently. Reportedly he took amlodipine 5 mg and his benazepril the morning of PAT. He monitors his BP at home.    BP (!) 174/80 Comment: rechecked/notified Research scientist (medical)  Pulse 82   Temp (!) 36.4 C (Oral)   Resp 20   Ht 5\' 9"  (1.753 m)   Wt 234 lb 4 oz (106.3 kg)   SpO2 100%   BMI 34.59 kg/m  BP 188/98, recheck  174/80.  EKG 10/07/17: NSR, inferior infarct (age undetermined). SR has replaced afib since 11/04/17 tracing. EKG dating back to 03/14/09 showed cannot rule out inferior infarct.  He reported a stress test and echo 5-10 years ago.   Cholangiogram 11/14/17: Acute cholecystitis, status post percutaneous cholecystostomy 10/04/2017. Repeat cholangiogram through the catheter today confirms persistent cystic duct obstruction. Therefore the drain catheter cannot be removed. Drain catheter will be left to gravity drainage until surgery.  CXR 10/23/17: IMPRESSION: No edema or consolidation.  Preoperative labs noted. K 5.3 (no mention of hemolysis), BUN 28, Cr 1.21. AST 61, ALT 116. (Previous BUN/Cr 22/1.04 and AST/ALT 21/29 on 11/20/17.)  WBC 12.7 (up from 7.5 on 11/20/17), H/H 12.0/35.8, PLT 230. Glucose 87. A1c on 10/23/17 was 6.8.   Above reviewed with anesthesiologist Dr. Oleta Mouse. Patient's BP not optimally controlled, but also not taking fully prescribed regimen. PAT RN advised close monitoring of BP and medication compliance as a significant elevated BP could delay or cancel an elective surgery. He will be holding benazepril and HCTZ on the morning of surgery, so encouraged that he take his fully prescribed dose of amlodipine. He had brief afib while hospitalized with sepsis in 09/2017. It' not clear to me if his PCP is aware since it was not listed as a discharge diagnosis. Dr. Ermalene Postin recommended discussing further with Dr. Luanna Salk he presensted with acute symptoms then would defer additional testing or referral orders to Dr. Lorelei Pont.     K, LFTs, WBC called to Dr. Dalbert Batman. Elevated LFTs believed to support need for planned surgery. Also notified Dr. Dalbert Batman that I am waiting on  call back from Dr. Lorelei Pont regarding additional preoperative recommendations, if any. Will await return call from Dr. Lorelei Pont.  Timothy Chapman [facility] Short Stay Center/Anesthesiology Phone [phone] 12/12/2017  5:17 PM  Addendum: I spoke with Dr. Lorelei Pont last week. She was not aware that he had developed an episode of afib during 09/2017 hospitalization. She referred [name] to cardiology, and he was seen by Dr. Jenne Campus on 12/16/17. BP there was 116/76. He wrote, "Considering the fact that he is fairly active and was very active before January he should be acceptable risk for surgery from cardiac standpoint review.  I will ask him to have echocardiogram to assess his left ventricular ejection fraction as well as look at his left atrial size.  He is not on beta-blocker and I would not initiate this medication right now she is reviewing all his medication in the hospital showed first-degree AV block and today's EKG shows PR interval of 196.  He need to be monitored setting after his surgery.  We need to pay attention to his fluids." Since PAF episode occurred only once in the setting of acute illness, he did not recommend an antiarrhythmic or anticoagulation at that time. CHADSVASc score 3. His echo was done on 12/18/17 and showed normal LVF.  Echo 12/18/17: Study Conclusions - Left ventricle: The cavity size was normal. There was moderate   concentric hypertrophy. Systolic function was normal. The   estimated ejection fraction was in the range of 55% to 60%. Wall   motion was normal; there were no regional wall motion   abnormalities. Doppler parameters are consistent with abnormal   left ventricular relaxation (grade 1 diastolic dysfunction). - Left atrium: The atrium was mildly dilated. - Atrial septum: No defect or patent foramen ovale was identified. - Pulmonary arteries: PA peak pressure: 31 mm Hg (S).  EKG 12/16/17: NSR.  I'll enter order for ISTAT4 to recheck for hyperkalemia, but based on currently available information, I would anticipate that he can proceed as planned if no acute changes and same day labs and BP results acceptable.  Timothy Chapman [facility] Short Stay  Center/Anesthesiology Phone [phone] 12/18/2017 5:06 PM

## 2017-12-12 NOTE — Progress Notes (Addendum)
PCP - Janett Billow Copland Cardiologist - denies  Chest x-ray - not needeed EKG - 10/07/17 Stress Test - > 5< 10 years ECHO - >5 >10 years Cardiac Cath - denies  Sleep Study - many years ago, patient stated that he didn't agree with the results so he doesn't wear the CPAP   Fasting Blood Sugar - 100-110 Checks Blood Sugar _1-2____ times a day  Blood Thinner Instructions: none Aspirin Instructions: patient stated that he takes aspirin as precaution so instructed patient to stop 5 days prior  Gave ERAS instructions to drink pre-surgery ensure before he leaves the house the morning of surgery  Anesthesia review: Yes, spoke with patient about elevated blood pressure.  Patient took 5mg  of norvasc and benazepril this morning but no HTCZ.  Instructed patient that he needs to keep a closer eye on his pressures until surgery.  Also instructed him to limit his salt intake   Patient denies shortness of breath, fever, cough and chest pain at PAT appointment   Patient verbalized understanding of instructions that were given to them at the PAT appointment. Patient was also instructed that they will need to review over the PAT instructions again at home before surgery.

## 2017-12-12 NOTE — Telephone Encounter (Signed)
Copied from [place] [id number]. Topic: Quick Communication - See Telephone Encounter >> Dec 12, 2017  3:51 PM Bea Graff, NT wrote: CRM for notification. See Telephone encounter for: 12/12/17. Ebony Hail with Anesthesia at [facility] is needing to speak with Janett Billow Copland regarding this pt. She states the pts blood pressure has still be elevated while he was in the hospital and had some AFIB. CB#: [phone]

## 2017-12-13 ENCOUNTER — Encounter: Payer: Self-pay | Admitting: Family Medicine

## 2017-12-13 DIAGNOSIS — H3581 Retinal edema: Secondary | ICD-10-CM

## 2017-12-13 HISTORY — DX: Retinal edema: H35.81

## 2017-12-13 NOTE — Telephone Encounter (Signed)
-----   Message from Jacinta Shoe, PA-C sent at 12/12/2017  5:30 PM EDT ----- Dr. Lorelei Pont,  Hi, I am a PA with anesthesia at [facility]. I left a message for you to contact me, but I thought I'd forward my note just to give you more info about why I was calling.   Mr. Rushing' chart was forwarded for anesthesia review due to elevated BP and abnormal labs. Although not reported, notes indicate that he had brief afib with RVR during his hospitalization for acute cholecystitis/septic shock/AKI. They did not do an echo, but follow-up EKG did show SR. Since you saw Mr. Daisey last month, anesthesiologist Dr. Ermalene Postin wanted me to touch base with you for additional preoperative recommendations, if any. Dr. Dalbert Batman (surgeon) is aware of the labs.  You may staff message or call me.   Thanks, George Hugh [facility] Short Stay Center/Anesthesiology Phone [phone] or [phone] 12/12/2017 5:29 PM

## 2017-12-13 NOTE — Progress Notes (Signed)
Spoke with Ms Timothy Chapman and scheduled pt to be seen by cardiology on 4/1.  LMOM for pt and sent mychart alerting him of this appt

## 2017-12-13 NOTE — Telephone Encounter (Signed)
Called and spoke with Timothy Chapman with anesthesia. No, I had not been aware that Timothy Chapman was in A fib with RVR while he was inpt, not mentioned in the discharge summary but it is apparent on his EKG from 10/04/17  Would like to have him see cardiology prior to operation   Timothy Chapman cardiology- he has an appt to be seen this coming Monday at Timothy Chapman cardiolgoy at Timothy Chapman at [place], Timothy Chapman

## 2017-12-16 ENCOUNTER — Ambulatory Visit (INDEPENDENT_AMBULATORY_CARE_PROVIDER_SITE_OTHER): Payer: Medicare HMO | Admitting: Cardiology

## 2017-12-16 ENCOUNTER — Encounter: Payer: Self-pay | Admitting: Cardiology

## 2017-12-16 VITALS — BP 116/76 | HR 72 | Resp 14 | Ht 69.0 in | Wt 234.0 lb

## 2017-12-16 DIAGNOSIS — I10 Essential (primary) hypertension: Secondary | ICD-10-CM

## 2017-12-16 DIAGNOSIS — E1140 Type 2 diabetes mellitus with diabetic neuropathy, unspecified: Secondary | ICD-10-CM

## 2017-12-16 DIAGNOSIS — I480 Paroxysmal atrial fibrillation: Secondary | ICD-10-CM | POA: Insufficient documentation

## 2017-12-16 HISTORY — DX: Paroxysmal atrial fibrillation: I48.0

## 2017-12-16 NOTE — H&P (Signed)
[place] Location: [facility] Patient #: [id number] DOB: 1946/04/04 Married / Language: English / Race: White Male        History of Present Illness      The patient is a 72 year old male who presents with a complaint of acute cholecystitis with sepsis. This is a 73 year old gentleman who returns following hospitalization 1 month ago for acute cholecystitis with sepsis. His PCP is [facility].     He was hospitalized at [facility] from January 16 through January 25. When he was admitted he was confused, septic, somewhat combative. It was not exactly sure what his problem was at first but it was thought to be either cholecystitis or cholangitis. He was so agitated we could not get a good quality MRCP, hepatobiliary scan. The hepatobiliary scan did suggest a patent common bile duct was patent but cystic duct patency could not be assessed due to poor cooperation. After 36 hours in the hospital it seemed that he was more tender in the right upper quadrant. In the early morning hours of January 18 we took him to the intensive care unit, intubated him and sedated and paralyzed him. We were then able to get a CT scan which suggested acute cholecystitis and stones were seen. CBD was 8.5 mm. He had some pleural effusions and a little bit of free abdominal fluid but no signs of any primary hepatic, gastric, small or large bowel disease. Percutaneous cholecystostomy was performed. Cultures ultimately grew Escherichia coli, enterococcus and Klebsiella pneumonia. After 48 hours he improved and was extubated and continued to improve. Acute respiratory failure resolved. Hypertension came under control. Diabetes came under control. Acute kidney injury improved     He now is feeling well. He looks much better to me. Mental status normal. Intelligent. Good insight. He is independent and driving his car. He has no significant pain. Appetite normal. Stools are a little  bit more frequent and a little bit softer than usual. Occasional diarrhea. Blood work on February 16 at his PCPs office shows normal liver function tests. Creatinine 1.77.      He has looked up all of this online. He is ready to proceed with the next step which is cholecystectomy.       Past history reveals no prior abdominal operations. 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 Borderline sleep apnea. Depression. Controlled GERD. Hypertension. BMI 34 Family history reveals mother died of COPD and tobacco abuse. Father died of coronary artery disease Social history reveals he is remarried for 1 year. One child from his first marriage. 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 Denies alcohol or tobacco       We had a very long talk about what happened to him. We both agree that we should proceed with definitive cholecystectomy. He knows that the risks are increased for conversion to open surgery and adjacent organ injury due to his sepsis. Nevertheless this is the appropriate next step and he is ready to go ahead. He will be scheduled for laparoscopic cholecystectomy with cholangiogram, possible open cholecystectomy The [facility] [place] [facility] I discussed the indications, details, techniques, and numerous risk of the surgery with him. He is aware of the risk of bleeding, infection, conversion to open laparotomy, injury to adjacent organs with major reconstructive surgery, pancreatitis, diarrhea, wound hernia, bile leak requiring readmission. I'm going to keep him in the hospital one night. He understands all these issues. All of his questions are  answered. He agrees with this plan.   Past Surgical History  Colon Polyp Removal - Colonoscopy  Nephrectomy  Left. Shoulder Surgery  Bilateral. Spinal Surgery - Lower Back   Diagnostic Studies History  Colonoscopy  5-10 years ago  Allergies  Codeine/Codeine Derivatives  Allergies Reconciled    Medication History  HydrALAZINE HCl (50MG  Tablet, Oral) Active. Labetalol HCl (100MG  Tablet, Oral) Active. LevoFLOXacin (750MG  Tablet, Oral) Active. CVS Aspirin (81MG  Tablet DR, Oral) Active. Famotidine (20MG  Tablet, Oral) Active. Acetylcarnitine HCl (500MG  Capsule, Oral) Active. Alpha-Lipoic Acid (600MG  Capsule, Oral) Active. Melatonin (3MG  Capsule, Oral) Active. Multiple Minerals-Vitamins (Oral) Active. Turmeric (450MG  Capsule, Oral) Active. Cialis (5MG  Tablet, Oral) Active. Diclofenac Sodium (75MG  Tablet DR, Oral) Active. GlipiZIDE (10MG  Tablet, Oral) Active. MetFORMIN HCl (Oral) Specific strength unknown - Active. Medications Reconciled  Social History Caffeine use  Coffee, Tea. No alcohol use  No drug use  Tobacco use  Former smoker.  Family History  Arthritis  Father. Diabetes Mellitus  Father, Sister. Heart Disease  Father. Hypertension  Father. Respiratory Condition  Mother.  Other Problems  Anxiety Disorder  Arthritis  Back Pain  Depression  Diabetes Mellitus  Diverticulosis  Enlarged Prostate  Gastroesophageal Reflux Disease  High blood pressure     Review of Systems General Present- Fatigue and Weight Loss. Not Present- Appetite Loss, Chills, Fever, Night Sweats and Weight Gain. HEENT Present- Wears glasses/contact lenses. Not Present- Earache, Hearing Loss, Hoarseness, Nose Bleed, Oral Ulcers, Ringing in the Ears, Seasonal Allergies, Sinus Pain, Sore Throat, Visual Disturbances and Yellow Eyes. Respiratory Not Present- Bloody sputum, Chronic Cough, Difficulty Breathing, Snoring and Wheezing. Breast Not Present- Breast Mass, Breast Pain, Nipple Discharge and Skin Changes. Cardiovascular Not Present- Chest Pain, Difficulty Breathing Lying Down, Leg Cramps, Palpitations, Rapid Heart Rate, Shortness of Breath and Swelling of Extremities. Gastrointestinal Present- Bloating and Gets full quickly at meals. Not Present-  Abdominal Pain, Bloody Stool, Change in Bowel Habits, Chronic diarrhea, Constipation, Difficulty Swallowing, Excessive gas, Hemorrhoids, Indigestion, Nausea, Rectal Pain and Vomiting. Male Genitourinary Present- Frequency and Nocturia. Not Present- Blood in Urine, Change in Urinary Stream, Impotence, Painful Urination, Urgency and Urine Leakage. Musculoskeletal Present- Back Pain, Joint Pain and Joint Stiffness. Not Present- Muscle Pain, Muscle Weakness and Swelling of Extremities. Neurological Not Present- Decreased Memory, Fainting, Headaches, Numbness, Seizures, Tingling, Tremor, Trouble walking and Weakness. Psychiatric Not Present- Anxiety, Bipolar, Change in Sleep Pattern, Depression, Fearful and Frequent crying. Endocrine Not Present- Cold Intolerance, Excessive Hunger, Hair Changes, Heat Intolerance, Hot flashes and New Diabetes. Hematology Not Present- Blood Thinners, Easy Bruising, Excessive bleeding, Gland problems, HIV and Persistent Infections.  Vitals  Weight: 233.25 lb Height: 69in Body Surface Area: 2.21 m Body Mass Index: 34.44 kg/m  Temp.: 98.77F(Oral)  Pulse: 77 (Regular)  BP: 144/90 (Sitting, Right Arm, Standard)       Physical Exam  General Mental Status-Alert. General Appearance-Consistent with stated age. Hydration-Well hydrated. Voice-Normal.  Head and Neck Head-normocephalic, atraumatic with no lesions or palpable masses. Trachea-midline. Thyroid Gland Characteristics - normal size and consistency.  Eye Eyeball - Bilateral-Extraocular movements intact. Sclera/Conjunctiva - Bilateral-No scleral icterus.  Chest and Lung Exam Chest and lung exam reveals -quiet, even and easy respiratory effort with no use of accessory muscles and on auscultation, normal breath sounds, no adventitious sounds and normal vocal resonance. Inspection Chest Wall - Normal. Back - normal.  Cardiovascular Cardiovascular examination reveals  -normal heart sounds, regular rate and rhythm with no murmurs and normal pedal pulses bilaterally.  Abdomen Inspection Inspection of the  abdomen reveals - No Hernias. Skin - Scar - no surgical scars. Palpation/Percussion Palpation and Percussion of the abdomen reveal - Soft, Non Tender, No Rebound tenderness, No Rigidity (guarding) and No hepatosplenomegaly. Auscultation Auscultation of the abdomen reveals - Bowel sounds normal. Note: Drain right upper quadrant draining clear bile. There must be a communication with the biliary tree somewhere. Slightly tender lateral to the drain site. No mass. No distention. No other scars. No hernias.   Neurologic Neurologic evaluation reveals -alert and oriented x 3 with no impairment of recent or remote memory. Mental Status-Normal.  Musculoskeletal Normal Exam - Left-Upper Extremity Strength Normal and Lower Extremity Strength Normal. Normal Exam - Right-Upper Extremity Strength Normal and Lower Extremity Strength Normal.  Lymphatic Head & Neck  General Head & Neck Lymphatics: Bilateral - Description - Normal. Axillary  General Axillary Region: Bilateral - Description - Normal. Tenderness - Non Tender. Femoral & Inguinal  Generalized Femoral & Inguinal Lymphatics: Bilateral - Description - Normal. Tenderness - Non Tender.    Assessment & Plan ACUTE CHOLECYSTITIS (K81.0)   You have recovered a great deal from your recent hospitalization for sepsis secondary to acute cholecystitis You were quite ill with sepsis, acute respiratory failure, acute renal failure, and poorly controlled hypertension All of that has improved and you are now independent The gallbladder drain is draining green bile but it is clear and does not look infected you appear well now  The recent injection study of your drain shows that the communication between the gallbladder and your main bile duct is occluded. The cystic duct is occluded. Therefore the  drain was appropriately left in  The next step in your care is to proceed with gallbladder surgery to remove the gallbladder and the drain This is a commonly done operation, but slightly increased risk due to year history of percutaneous drainage and sepsis  you will be scheduled for laparoscopic cholecystectomy with cholangiogram, possible open cholecystectomy We have discussed the indications, techniques, and risks of the surgery in detail    SEPSIS, GRAM NEGATIVE (A41.50) BMI 34.0-34.9,ADULT (Z68.34) TYPE 2 DIABETES MELLITUS TREATED WITHOUT INSULIN (E11.9) HYPERTENSION, ESSENTIAL (I10) ACUTE RESPIRATORY FAILURE WITH HYPOXIA (J96.01) ACUTE KIDNEY INJURY (N17.9) Impression: Probable ATN secondary to sepsis and dehydration    Edsel Petrin. Dalbert Batman, M.D., [facility] General and Minimally invasive Surgery Breast and Colorectal Surgery Office:   [phone] Pager:   [phone]

## 2017-12-16 NOTE — Progress Notes (Signed)
Cardiology Consultation:    Date:  12/16/2017   ID:  Timothy Chapman, DOB July 17, 1946, MRN [id number]  PCP:  [name], MD  Cardiologist:  Jenne Campus, MD   Referring MD: [name], MD   Chief Complaint  Patient presents with  . Atrial Fibrillation  I need surgery on Friday  History of Present Illness:    Timothy Chapman is a 72 y.o. male who is being seen today for the evaluation of paroxysmal atrial fibrillation at the request of Copland, Gay Filler, MD.  In January he ended up going to the hospital because nausea and vomiting he was fine to have a sepsis which was related to cholangitis.  He ended up having percutaneous drain placed at that time.  He did have a stormy hospital course he required intubation.  He also had one documented episode of atrial fibrillation.  Converted spontaneously to normal sinus rhythm eventually he left hospital.  Now a few months later he is in my office because he does have surgery scheduled on Friday which will be laparoscopic cholecystectomy.  Overall he said he is doing fair.  He complains of being weak and tired but at the same time he can go to the store and walk around.  He walks with his wife every single day for about half an hour he did it last time on Friday.  Before his problem in January he went to gym on the regular basis.  He uses elliptical treadmill as way to have some minor machines.  He could stay on the treadmill for about half an hour.  Speed was up to 3 mph.  Last time he went there was at the end of December.  Since the time of hospitalization she did not go to gym. Risk factors for coronary artery disease include diabetes, hypertension, intolerance to statin and I do not know what his cholesterol is.  He smoked but quit many years ago.  Does not have family history of premature coronary artery disease In terms of atrial fibrillation he denies having any palpitations.  Past Medical History:  Diagnosis Date  . AKI  (acute kidney injury) ([place])   . Anxiety   . Arthritis   . Barrett esophagus   . Depression   . Diabetes mellitus without complication ([place])    type 2  . GERD (gastroesophageal reflux disease)   . Hypertension   . Neuromuscular disorder (HCC)    neuropathy but being treated  . PONV (postoperative nausea and vomiting)   . Sleep apnea    has a sleep study but refuses that he has sleep study, not wearing CPAP    Past Surgical History:  Procedure Laterality Date  . BACK SURGERY     X2 - lower  . COLONOSCOPY    . ESOPHAGOGASTRODUODENOSCOPY    . IR RADIOLOGIST EVAL & MGMT  10/30/2017  . IR RADIOLOGIST EVAL & MGMT  11/14/2017  . REPLACEMENT TOTAL KNEE Left   . ROTATOR CUFF REPAIR      Current Medications: Current Meds  Medication Sig  . Acetylcarnitine HCl 500 MG CAPS Take 500 mg by mouth daily.  Marland Kitchen amLODipine (NORVASC) 5 MG tablet Take 7.5 mg daily.  Increase to 10 mg as directed (Patient taking differently: Take 10 mg by mouth daily. )  . benazepril (LOTENSIN) 40 MG tablet Take 1 tablet (40 mg total) by mouth daily.  . famotidine (PEPCID) 20 MG tablet Take 1 tablet (20 mg total) by mouth  2 (two) times daily.  Marland Kitchen glipiZIDE (GLUCOTROL) 10 MG tablet Take 1 tablet (10 mg total) by mouth 2 (two) times daily before a meal.  . hydrochlorothiazide (HYDRODIURIL) 25 MG tablet Take 1 tablet (25 mg total) by mouth daily.  . Melatonin 3 MG TABS Take 1 tablet by mouth at bedtime.   . metFORMIN (GLUCOPHAGE) 1000 MG tablet Take 1 tablet (1,000 mg total) by mouth 2 (two) times daily with a meal.  . omeprazole (PRILOSEC) 40 MG capsule Take 1 capsule (40 mg total) by mouth daily.  . tadalafil (CIALIS) 5 MG tablet Take 1 tablet (5 mg total) daily as needed by mouth for erectile dysfunction (and BPH). (Patient taking differently: Take 5 mg by mouth daily. )  . Turmeric 450 MG CAPS Take 1 tablet by mouth daily.      Allergies:   Codeine   Social History   Socioeconomic History  . Marital status:  Married    Spouse name: Not on file  . Number of children: Not on file  . Years of education: Not on file  . Highest education level: Not on file  Occupational History  . Not on file  Social Needs  . Financial resource strain: Not on file  . Food insecurity:    Worry: Not on file    Inability: Not on file  . Transportation needs:    Medical: Not on file    Non-medical: Not on file  Tobacco Use  . Smoking status: Former Smoker    Last attempt to quit: 09/18/1975    Years since quitting: 42.2  . Smokeless tobacco: Never Used  Substance and Sexual Activity  . Alcohol use: Yes    Alcohol/week: 0.0 oz    Comment: Rarely  . Drug use: Never  . Sexual activity: Not on file  Lifestyle  . Physical activity:    Days per week: Not on file    Minutes per session: Not on file  . Stress: Not on file  Relationships  . Social connections:    Talks on phone: Not on file    Gets together: Not on file    Attends religious service: Not on file    Active member of club or organization: Not on file    Attends meetings of clubs or organizations: Not on file    Relationship status: Not on file  Other Topics Concern  . Not on file  Social History Narrative  . Not on file     Family History: The patient's family history includes Emphysema in his mother; Heart disease in his father; Heart failure in his father. ROS:   Please see the history of present illness.    All 14 point review of systems negative except as described per history of present illness.  EKGs/Labs/Other Studies Reviewed:    The following studies were reviewed today: Multiple studies reviewed from hospitalization January  EKG:  EKG is  ordered today.  The ekg ordered today demonstrates normal sinus rhythm normal PR interval normal QS complex duration morphology no ST segment changes  Recent Labs: 10/07/2017: Magnesium 1.9 10/23/2017: Pro B Natriuretic peptide (BNP) 165.0 12/12/2017: ALT 116; BUN 28; Creatinine, Ser 1.21;  Hemoglobin 12.0; Platelets 230; Potassium 5.3; Sodium 136  Recent Lipid Panel    Component Value Date/Time   CHOL 141 07/09/2017 0826   TRIG 362 (H) 10/07/2017 0746   HDL 25.70 (L) 07/09/2017 0826   CHOLHDL 5 07/09/2017 0826   VLDL 57.0 (H) 07/09/2017 0258  LDLDIRECT 77.0 07/09/2017 0826    Physical Exam:    VS:  BP 116/76   Pulse 72   Resp 14   Ht 5\' 9"  (1.753 m)   Wt 234 lb (106.1 kg)   BMI 34.56 kg/m     Wt Readings from Last 3 Encounters:  12/16/17 234 lb (106.1 kg)  12/12/17 234 lb 4 oz (106.3 kg)  11/20/17 236 lb 12.8 oz (107.4 kg)     GEN:  Well nourished, well developed in no acute distress HEENT: Normal NECK: No JVD; No carotid bruits LYMPHATICS: No lymphadenopathy CARDIAC: RRR, no murmurs, no rubs, no gallops RESPIRATORY:  Clear to auscultation without rales, wheezing or rhonchi  ABDOMEN: Soft, non-tender, non-distended MUSCULOSKELETAL:  No edema; No deformity  SKIN: Warm and dry NEUROLOGIC:  Alert and oriented x 3 PSYCHIATRIC:  Normal affect   ASSESSMENT:    1. HYPERTENSION, BENIGN ESSENTIAL, UNCONTROLLED   2. Controlled type 2 diabetes mellitus with diabetic neuropathy, without long-term current use of insulin (HCC)   3. Paroxysmal atrial fibrillation (HCC)    PLAN:    In order of problems listed above:  1. Preop evaluation for this gentleman with multiple risk factors for coronary artery disease and history of paroxysmal atrial fibrillation while in the hospital with sepsis.  His surgery is considered relatively low risk.  He will have it done under general anesthesia.  Considering the fact that he is fairly active and was very active before January he should be acceptable risk for surgery from cardiac standpoint review.  I will ask him to have echocardiogram to assess his left ventricular ejection fraction as well as look at his left atrial size.  He is not on beta-blocker and I would not initiate this medication right now she is reviewing all his  medication in the hospital showed first-degree AV block and today's EKG shows PR interval of 196.  He need to be monitored setting after his surgery.  We need to pay attention to his fluids. 2. Essential hypertension: Blood pressure elevated today.  I will not change any of his medications right now however while in the hospital we can use hydralazine on as-needed basis even intravenously if needed.  I would maintain his calcium channel blocker as well as ACE inhibitor. 3. Paroxysmal atrial fibrillation: Only one documented episode while he was having sepsis.  Will look at echocardiogram to assess his left atrial size.  I will not put him on any antiarrhythmic at the moment.  I will not initiate any anticoagulation at the moment.  Daniel suspect episode of atrial fibrillation was because he was very sick.  Obviously he required monitoring while in the hospital for gallbladder surgery.  If develops atrial fibrillation will need to be treated appropriately and then anticoagulation to be considered.  Chads 2 Vascor equals 3 4. Diabetes: Very well controlled apparently he does have some problem with low sugar lately his last hemoglobin A1c was less than 6.8.  He will have echocardiogram done and after that I will see him back in my office in about a month at that time we will make sure we will try to modify his risk factors for coronary artery disease.   Medication Adjustments/Labs and Tests Ordered: Current medicines are reviewed at length with the patient today.  Concerns regarding medicines are outlined above.  No orders of the defined types were placed in this encounter.  No orders of the defined types were placed in this encounter.   Signed, Herbie Baltimore  Synetta Fail, MD, [facility]. 12/16/2017 9:00 AM    [place]

## 2017-12-16 NOTE — Patient Instructions (Addendum)
Medication Instructions:  Your physician recommends that you continue on your current medications as directed. Please refer to the Current Medication list given to you today.   Labwork: None  Testing/Procedures: You had an EKG today.   Your physician has requested that you have an echocardiogram. Echocardiography is a painless test that uses sound waves to create images of your heart. It provides your doctor with information about the size and shape of your heart and how well your heart's chambers and valves are working. This procedure takes approximately one hour. There are no restrictions for this procedure. This test will be done at [address] [place], [place] [address].    Follow-Up: Your physician recommends that you schedule a follow-up appointment in: 1 month.  Any Other Special Instructions Will Be Listed Below (If Applicable).  Dr. Agustin Cree wants results of echocardiogram before giving clearance for surgery. We will call you with the results.     If you need a refill on your cardiac medications before your next appointment, please call your pharmacy.

## 2017-12-18 ENCOUNTER — Ambulatory Visit (HOSPITAL_COMMUNITY): Payer: Medicare HMO | Attending: Cardiovascular Disease

## 2017-12-18 DIAGNOSIS — Z7984 Long term (current) use of oral hypoglycemic drugs: Secondary | ICD-10-CM | POA: Diagnosis not present

## 2017-12-18 DIAGNOSIS — Z4803 Encounter for change or removal of drains: Secondary | ICD-10-CM | POA: Diagnosis not present

## 2017-12-18 DIAGNOSIS — I119 Hypertensive heart disease without heart failure: Secondary | ICD-10-CM | POA: Diagnosis not present

## 2017-12-18 DIAGNOSIS — E119 Type 2 diabetes mellitus without complications: Secondary | ICD-10-CM | POA: Diagnosis not present

## 2017-12-18 DIAGNOSIS — K8309 Other cholangitis: Secondary | ICD-10-CM | POA: Diagnosis not present

## 2017-12-18 DIAGNOSIS — Z87891 Personal history of nicotine dependence: Secondary | ICD-10-CM | POA: Diagnosis not present

## 2017-12-18 DIAGNOSIS — G4730 Sleep apnea, unspecified: Secondary | ICD-10-CM | POA: Diagnosis not present

## 2017-12-18 DIAGNOSIS — R69 Illness, unspecified: Secondary | ICD-10-CM | POA: Diagnosis not present

## 2017-12-18 DIAGNOSIS — I480 Paroxysmal atrial fibrillation: Secondary | ICD-10-CM | POA: Insufficient documentation

## 2017-12-18 DIAGNOSIS — I10 Essential (primary) hypertension: Secondary | ICD-10-CM | POA: Diagnosis not present

## 2017-12-18 DIAGNOSIS — Z7982 Long term (current) use of aspirin: Secondary | ICD-10-CM | POA: Diagnosis not present

## 2017-12-19 NOTE — Anesthesia Preprocedure Evaluation (Addendum)
Anesthesia Evaluation  Patient identified by MRN, date of birth, ID band Patient awake    Reviewed: Allergy & Precautions, NPO status , Patient's Chart, lab work & pertinent test results  History of Anesthesia Complications (+) PONV  Airway Mallampati: II  TM Distance: >3 FB Neck ROM: Full    Dental  (+) Dental Advisory Given, Teeth Intact   Pulmonary sleep apnea , former smoker,    Pulmonary exam normal breath sounds clear to auscultation       Cardiovascular hypertension, Pt. on medications Normal cardiovascular exam Rhythm:Regular Rate:Normal  '19 TTE - EF 55% to 60%. Grade 1 diastolic dysfunction. LA was mildly dilated. PA peak pressure: 31 mm Hg.   Neuro/Psych Anxiety Depression Neuropathy - resolved    GI/Hepatic Neg liver ROS, GERD  Medicated and Controlled,  Endo/Other  diabetes, Type 2, Oral Hypoglycemic AgentsObesity  Renal/GU negative Renal ROS  negative genitourinary   Musculoskeletal  (+) Arthritis ,   Abdominal (+) + obese,   Peds  Hematology  (+) anemia ,   Anesthesia Other Findings   Reproductive/Obstetrics                            Anesthesia Physical Anesthesia Plan  ASA: II  Anesthesia Plan: General   Post-op Pain Management:    Induction: Intravenous  PONV Risk Score and Plan: 4 or greater and Treatment may vary due to age or medical condition, Ondansetron, Dexamethasone and Scopolamine patch - Pre-op  Airway Management Planned: Oral ETT  Additional Equipment: None  Intra-op Plan:   Post-operative Plan: Extubation in OR  Informed Consent: I have reviewed the patients History and Physical, chart, labs and discussed the procedure including the risks, benefits and alternatives for the proposed anesthesia with the patient or authorized representative who has indicated his/her understanding and acceptance.   Dental advisory given  Plan Discussed with: CRNA  and Anesthesiologist  Anesthesia Plan Comments:         Anesthesia Quick Evaluation

## 2017-12-20 ENCOUNTER — Ambulatory Visit (HOSPITAL_COMMUNITY): Payer: Medicare HMO | Admitting: Vascular Surgery

## 2017-12-20 ENCOUNTER — Ambulatory Visit (HOSPITAL_COMMUNITY): Payer: Medicare HMO

## 2017-12-20 ENCOUNTER — Ambulatory Visit (HOSPITAL_COMMUNITY)
Admission: RE | Admit: 2017-12-20 | Discharge: 2017-12-20 | Disposition: A | Discharge status: Home / Self Care | Payer: Medicare HMO | Source: Ambulatory Visit | Attending: General Surgery | Admitting: General Surgery

## 2017-12-20 ENCOUNTER — Encounter (HOSPITAL_COMMUNITY)
Admission: RE | Disposition: A | Discharge status: Home / Self Care | Payer: Self-pay | Source: Ambulatory Visit | Attending: General Surgery

## 2017-12-20 ENCOUNTER — Encounter (HOSPITAL_COMMUNITY): Payer: Self-pay | Admitting: *Deleted

## 2017-12-20 DIAGNOSIS — I10 Essential (primary) hypertension: Secondary | ICD-10-CM | POA: Diagnosis not present

## 2017-12-20 DIAGNOSIS — K219 Gastro-esophageal reflux disease without esophagitis: Secondary | ICD-10-CM | POA: Insufficient documentation

## 2017-12-20 DIAGNOSIS — G4730 Sleep apnea, unspecified: Secondary | ICD-10-CM | POA: Insufficient documentation

## 2017-12-20 DIAGNOSIS — K8010 Calculus of gallbladder with chronic cholecystitis without obstruction: Secondary | ICD-10-CM | POA: Insufficient documentation

## 2017-12-20 DIAGNOSIS — Z6834 Body mass index (BMI) 34.0-34.9, adult: Secondary | ICD-10-CM | POA: Insufficient documentation

## 2017-12-20 DIAGNOSIS — K810 Acute cholecystitis: Secondary | ICD-10-CM | POA: Diagnosis not present

## 2017-12-20 DIAGNOSIS — Z87891 Personal history of nicotine dependence: Secondary | ICD-10-CM | POA: Insufficient documentation

## 2017-12-20 DIAGNOSIS — F419 Anxiety disorder, unspecified: Secondary | ICD-10-CM | POA: Insufficient documentation

## 2017-12-20 DIAGNOSIS — Z79899 Other long term (current) drug therapy: Secondary | ICD-10-CM | POA: Diagnosis not present

## 2017-12-20 DIAGNOSIS — Z7984 Long term (current) use of oral hypoglycemic drugs: Secondary | ICD-10-CM | POA: Diagnosis not present

## 2017-12-20 DIAGNOSIS — K8000 Calculus of gallbladder with acute cholecystitis without obstruction: Secondary | ICD-10-CM | POA: Diagnosis present

## 2017-12-20 DIAGNOSIS — E119 Type 2 diabetes mellitus without complications: Secondary | ICD-10-CM | POA: Diagnosis not present

## 2017-12-20 DIAGNOSIS — Z419 Encounter for procedure for purposes other than remedying health state, unspecified: Secondary | ICD-10-CM

## 2017-12-20 DIAGNOSIS — F329 Major depressive disorder, single episode, unspecified: Secondary | ICD-10-CM | POA: Insufficient documentation

## 2017-12-20 DIAGNOSIS — E669 Obesity, unspecified: Secondary | ICD-10-CM | POA: Diagnosis not present

## 2017-12-20 DIAGNOSIS — R69 Illness, unspecified: Secondary | ICD-10-CM | POA: Diagnosis not present

## 2017-12-20 DIAGNOSIS — I480 Paroxysmal atrial fibrillation: Secondary | ICD-10-CM | POA: Diagnosis not present

## 2017-12-20 HISTORY — PX: CHOLECYSTECTOMY: SHX55

## 2017-12-20 LAB — POCT I-STAT 4, (NA,K, GLUC, HGB,HCT)
GLUCOSE: 209 mg/dL — AB (ref 65–99)
HCT: 33 % — ABNORMAL LOW (ref 39.0–52.0)
HEMOGLOBIN: 11.2 g/dL — AB (ref 13.0–17.0)
POTASSIUM: 4.8 mmol/L (ref 3.5–5.1)
Sodium: 137 mmol/L (ref 135–145)

## 2017-12-20 LAB — GLUCOSE, CAPILLARY
GLUCOSE-CAPILLARY: 173 mg/dL — AB (ref 65–99)
GLUCOSE-CAPILLARY: 142 mg/dL — AB (ref 65–99)

## 2017-12-20 SURGERY — LAPAROSCOPIC CHOLECYSTECTOMY WITH INTRAOPERATIVE CHOLANGIOGRAM
Anesthesia: General | Site: Abdomen

## 2017-12-20 MED ORDER — LIDOCAINE HCL (CARDIAC) 20 MG/ML IV SOLN
INTRAVENOUS | Status: AC
  Filled 2017-12-20: qty 5

## 2017-12-20 MED ORDER — ACETAMINOPHEN 650 MG RE SUPP: 650.0000 mg | RECTAL | Status: DC | PRN

## 2017-12-20 MED ORDER — IOPAMIDOL (ISOVUE-300) INJECTION 61%
INTRAVENOUS | Status: AC
  Filled 2017-12-20: qty 50

## 2017-12-20 MED ORDER — OXYCODONE HCL 5 MG/5ML PO SOLN: 5.0000 mg | Freq: Once | ORAL | Status: AC | PRN

## 2017-12-20 MED ORDER — PROPOFOL 10 MG/ML IV BOLUS
INTRAVENOUS | Status: AC
  Filled 2017-12-20: qty 20

## 2017-12-20 MED ORDER — LIDOCAINE HCL (CARDIAC) 20 MG/ML IV SOLN
INTRAVENOUS | Status: DC | PRN
  Administered 2017-12-20: 100 mg via INTRAVENOUS

## 2017-12-20 MED ORDER — FENTANYL CITRATE (PF) 250 MCG/5ML IJ SOLN
INTRAMUSCULAR | Status: DC | PRN
  Administered 2017-12-20 (×5): 50 ug via INTRAVENOUS
  Administered 2017-12-20: 100 ug via INTRAVENOUS

## 2017-12-20 MED ORDER — DEXAMETHASONE SODIUM PHOSPHATE 10 MG/ML IJ SOLN
INTRAMUSCULAR | Status: AC
Start: 2017-12-20 — End: 2017-12-20
  Filled 2017-12-20: qty 1

## 2017-12-20 MED ORDER — CELECOXIB 200 MG PO CAPS
200.0000 mg | ORAL_CAPSULE | ORAL | Status: AC
  Administered 2017-12-20: 200 mg via ORAL
  Filled 2017-12-20: qty 1

## 2017-12-20 MED ORDER — FENTANYL CITRATE (PF) 250 MCG/5ML IJ SOLN
INTRAMUSCULAR | Status: AC
  Filled 2017-12-20: qty 5

## 2017-12-20 MED ORDER — ONDANSETRON HCL 4 MG/2ML IJ SOLN
INTRAMUSCULAR | Status: DC | PRN
  Administered 2017-12-20: 4 mg via INTRAVENOUS

## 2017-12-20 MED ORDER — SCOPOLAMINE 1 MG/3DAYS TD PT72
MEDICATED_PATCH | TRANSDERMAL | Status: AC
  Filled 2017-12-20: qty 1

## 2017-12-20 MED ORDER — SODIUM CHLORIDE 0.9% FLUSH: 3.0000 mL | INTRAVENOUS | Status: DC | PRN

## 2017-12-20 MED ORDER — SUGAMMADEX SODIUM 200 MG/2ML IV SOLN
INTRAVENOUS | Status: AC
  Filled 2017-12-20: qty 2

## 2017-12-20 MED ORDER — SODIUM CHLORIDE 0.9 % IR SOLN
Status: DC | PRN
  Administered 2017-12-20: 1

## 2017-12-20 MED ORDER — FENTANYL CITRATE (PF) 100 MCG/2ML IJ SOLN
INTRAMUSCULAR | Status: AC
  Filled 2017-12-20: qty 2

## 2017-12-20 MED ORDER — SODIUM CHLORIDE 0.9% FLUSH: 3.0000 mL | Freq: Two times a day (BID) | INTRAVENOUS | Status: DC

## 2017-12-20 MED ORDER — OXYCODONE HCL 5 MG PO TABS
5.0000 mg | ORAL_TABLET | Freq: Once | ORAL | Status: AC | PRN
  Administered 2017-12-20: 5 mg via ORAL

## 2017-12-20 MED ORDER — PROPOFOL 10 MG/ML IV BOLUS
INTRAVENOUS | Status: DC | PRN
  Administered 2017-12-20: 170 mg via INTRAVENOUS
  Administered 2017-12-20: 30 mg via INTRAVENOUS

## 2017-12-20 MED ORDER — KETAMINE HCL 10 MG/ML IJ SOLN
INTRAMUSCULAR | Status: AC
  Filled 2017-12-20: qty 1

## 2017-12-20 MED ORDER — FENTANYL CITRATE (PF) 100 MCG/2ML IJ SOLN: 25.0000 ug | INTRAMUSCULAR | Status: DC | PRN

## 2017-12-20 MED ORDER — GABAPENTIN 300 MG PO CAPS
300.0000 mg | ORAL_CAPSULE | ORAL | Status: AC
  Administered 2017-12-20: 300 mg via ORAL

## 2017-12-20 MED ORDER — CHLORHEXIDINE GLUCONATE CLOTH 2 % EX PADS: 6.0000 | MEDICATED_PAD | Freq: Once | CUTANEOUS | Status: DC

## 2017-12-20 MED ORDER — ONDANSETRON HCL 4 MG/2ML IJ SOLN
INTRAMUSCULAR | Status: AC
Start: 2017-12-20 — End: 2017-12-20
  Filled 2017-12-20: qty 2

## 2017-12-20 MED ORDER — KETAMINE HCL 10 MG/ML IJ SOLN
INTRAMUSCULAR | Status: DC | PRN
  Administered 2017-12-20: 30 mg via INTRAVENOUS

## 2017-12-20 MED ORDER — ROCURONIUM BROMIDE 10 MG/ML (PF) SYRINGE
PREFILLED_SYRINGE | INTRAVENOUS | Status: AC
  Filled 2017-12-20: qty 5

## 2017-12-20 MED ORDER — HYDROCODONE-ACETAMINOPHEN 5-325 MG PO TABS: 1.0000 | ORAL_TABLET | Freq: Four times a day (QID) | ORAL | 0 refills | Status: DC | PRN

## 2017-12-20 MED ORDER — OXYCODONE HCL 5 MG PO TABS: 5.0000 mg | ORAL_TABLET | ORAL | Status: DC | PRN

## 2017-12-20 MED ORDER — SUGAMMADEX SODIUM 200 MG/2ML IV SOLN
INTRAVENOUS | Status: DC | PRN
  Administered 2017-12-20: 200 mg via INTRAVENOUS

## 2017-12-20 MED ORDER — ROCURONIUM BROMIDE 100 MG/10ML IV SOLN
INTRAVENOUS | Status: DC | PRN
  Administered 2017-12-20 (×2): 10 mg via INTRAVENOUS
  Administered 2017-12-20: 50 mg via INTRAVENOUS

## 2017-12-20 MED ORDER — LIDOCAINE IN D5W 4-5 MG/ML-% IV SOLN
INTRAVENOUS | Status: DC | PRN
  Administered 2017-12-20: 25 ug/kg/min via INTRAVENOUS

## 2017-12-20 MED ORDER — LACTATED RINGERS IV SOLN
INTRAVENOUS | Status: DC | PRN
  Administered 2017-12-20 (×2): via INTRAVENOUS

## 2017-12-20 MED ORDER — GABAPENTIN 300 MG PO CAPS
ORAL_CAPSULE | ORAL | Status: AC
  Administered 2017-12-20: 300 mg via ORAL
  Filled 2017-12-20: qty 1

## 2017-12-20 MED ORDER — DEXAMETHASONE SODIUM PHOSPHATE 10 MG/ML IJ SOLN
INTRAMUSCULAR | Status: DC | PRN
Start: 2017-12-20 — End: 2017-12-20
  Administered 2017-12-20: 10 mg via INTRAVENOUS

## 2017-12-20 MED ORDER — HYDROMORPHONE HCL 1 MG/ML IJ SOLN
INTRAMUSCULAR | Status: AC
  Filled 2017-12-20: qty 0.5

## 2017-12-20 MED ORDER — 0.9 % SODIUM CHLORIDE (POUR BTL) OPTIME
TOPICAL | Status: DC | PRN
  Administered 2017-12-20: 1000 mL

## 2017-12-20 MED ORDER — ACETAMINOPHEN 325 MG PO TABS: 650.0000 mg | ORAL_TABLET | ORAL | Status: DC | PRN

## 2017-12-20 MED ORDER — OXYCODONE HCL 5 MG PO TABS
ORAL_TABLET | ORAL | Status: AC
  Filled 2017-12-20: qty 1

## 2017-12-20 MED ORDER — SCOPOLAMINE 1 MG/3DAYS TD PT72
MEDICATED_PATCH | TRANSDERMAL | Status: DC | PRN
  Administered 2017-12-20: 1 via TRANSDERMAL

## 2017-12-20 MED ORDER — FENTANYL CITRATE (PF) 100 MCG/2ML IJ SOLN
25.0000 ug | INTRAMUSCULAR | Status: DC | PRN
  Administered 2017-12-20 (×2): 50 ug via INTRAVENOUS

## 2017-12-20 MED ORDER — ONDANSETRON HCL 4 MG/2ML IJ SOLN: 4.0000 mg | Freq: Once | INTRAMUSCULAR | Status: DC | PRN

## 2017-12-20 MED ORDER — LACTATED RINGERS IV SOLN: INTRAVENOUS | Status: DC

## 2017-12-20 MED ORDER — SODIUM CHLORIDE 0.9 % IV SOLN: 250.0000 mL | INTRAVENOUS | Status: DC | PRN

## 2017-12-20 MED ORDER — SODIUM CHLORIDE 0.9 % IV SOLN
INTRAVENOUS | Status: DC | PRN
  Administered 2017-12-20: 20 mL

## 2017-12-20 MED ORDER — HYDROMORPHONE HCL 1 MG/ML IJ SOLN
INTRAMUSCULAR | Status: DC | PRN
  Administered 2017-12-20: 0.5 mg via INTRAVENOUS

## 2017-12-20 MED ORDER — CEFAZOLIN SODIUM-DEXTROSE 2-4 GM/100ML-% IV SOLN
2.0000 g | INTRAVENOUS | Status: AC
  Administered 2017-12-20: 2 g via INTRAVENOUS
  Filled 2017-12-20: qty 100

## 2017-12-20 MED ORDER — BUPIVACAINE-EPINEPHRINE (PF) 0.5% -1:200000 IJ SOLN
INTRAMUSCULAR | Status: AC
  Filled 2017-12-20: qty 30

## 2017-12-20 SURGICAL SUPPLY — 47 items
APPLICATOR ARISTA FLEXITIP XL (MISCELLANEOUS) ×2 IMPLANT
APPLIER CLIP ROT 10 11.4 M/L (STAPLE) ×2
BLADE CLIPPER SURG (BLADE) IMPLANT
CANISTER SUCT 3000ML PPV (MISCELLANEOUS) ×2 IMPLANT
CHLORAPREP W/TINT 26ML (MISCELLANEOUS) ×2 IMPLANT
CLIP APPLIE ROT 10 11.4 M/L (STAPLE) ×1 IMPLANT
COVER MAYO STAND STRL (DRAPES) ×2 IMPLANT
COVER SURGICAL LIGHT HANDLE (MISCELLANEOUS) ×2 IMPLANT
DERMABOND ADVANCED (GAUZE/BANDAGES/DRESSINGS) ×1
DERMABOND ADVANCED .7 DNX12 (GAUZE/BANDAGES/DRESSINGS) ×1 IMPLANT
DRAPE C-ARM 42X72 X-RAY (DRAPES) ×2 IMPLANT
ELECT REM PT RETURN 9FT ADLT (ELECTROSURGICAL) ×2
ELECTRODE REM PT RTRN 9FT ADLT (ELECTROSURGICAL) ×1 IMPLANT
GLOVE BIOGEL M STER SZ 6 (GLOVE) ×4 IMPLANT
GLOVE BIOGEL PI IND STRL 6.5 (GLOVE) ×2 IMPLANT
GLOVE BIOGEL PI IND STRL 7.5 (GLOVE) ×3 IMPLANT
GLOVE BIOGEL PI IND STRL 8 (GLOVE) ×3 IMPLANT
GLOVE BIOGEL PI INDICATOR 6.5 (GLOVE) ×2
GLOVE BIOGEL PI INDICATOR 7.5 (GLOVE) ×3
GLOVE BIOGEL PI INDICATOR 8 (GLOVE) ×3
GLOVE EUDERMIC 7 POWDERFREE (GLOVE) ×6 IMPLANT
GOWN STRL REUS W/ TWL LRG LVL3 (GOWN DISPOSABLE) ×2 IMPLANT
GOWN STRL REUS W/ TWL XL LVL3 (GOWN DISPOSABLE) ×1 IMPLANT
GOWN STRL REUS W/TWL LRG LVL3 (GOWN DISPOSABLE) ×2
GOWN STRL REUS W/TWL XL LVL3 (GOWN DISPOSABLE) ×1
HEMOSTAT ARISTA ABSORB 3G PWDR (MISCELLANEOUS) ×2 IMPLANT
KIT BASIN OR (CUSTOM PROCEDURE TRAY) ×2 IMPLANT
KIT TURNOVER KIT B (KITS) ×2 IMPLANT
NS IRRIG 1000ML POUR BTL (IV SOLUTION) ×2 IMPLANT
PAD ARMBOARD 7.5X6 YLW CONV (MISCELLANEOUS) ×2 IMPLANT
POUCH RETRIEVAL ECOSAC 10 (ENDOMECHANICALS) ×1 IMPLANT
POUCH RETRIEVAL ECOSAC 10MM (ENDOMECHANICALS) ×1
POUCH SPECIMEN RETRIEVAL 10MM (ENDOMECHANICALS) ×2 IMPLANT
SCISSORS LAP 5X35 DISP (ENDOMECHANICALS) ×2 IMPLANT
SET CHOLANGIOGRAPH 5 50 .035 (SET/KITS/TRAYS/PACK) ×2 IMPLANT
SET IRRIG TUBING LAPAROSCOPIC (IRRIGATION / IRRIGATOR) ×2 IMPLANT
SLEEVE ENDOPATH XCEL 5M (ENDOMECHANICALS) ×2 IMPLANT
SPECIMEN JAR SMALL (MISCELLANEOUS) ×2 IMPLANT
SUT MNCRL AB 4-0 PS2 18 (SUTURE) ×4 IMPLANT
TOWEL OR 17X24 6PK STRL BLUE (TOWEL DISPOSABLE) ×2 IMPLANT
TOWEL OR 17X26 10 PK STRL BLUE (TOWEL DISPOSABLE) ×2 IMPLANT
TRAY LAPAROSCOPIC MC (CUSTOM PROCEDURE TRAY) ×2 IMPLANT
TROCAR XCEL BLUNT TIP 100MML (ENDOMECHANICALS) ×2 IMPLANT
TROCAR XCEL NON-BLD 11X100MML (ENDOMECHANICALS) ×2 IMPLANT
TROCAR XCEL NON-BLD 5MMX100MML (ENDOMECHANICALS) ×2 IMPLANT
TUBING INSUFFLATION (TUBING) ×2 IMPLANT
WATER STERILE IRR 1000ML POUR (IV SOLUTION) ×2 IMPLANT

## 2017-12-20 NOTE — Op Note (Signed)
Patient Name:           Timothy Chapman [facility]   Date of Surgery:        12/20/2017  Pre op Diagnosis:      Cholecystitis with cholelithiasis                                       Status post percutaneous cholecystostomy  Post op Diagnosis:    Same  Procedure:                 Laparoscopic cholecystectomy with intraoperative cholangiogram  Surgeon:                     Edsel Petrin. Dalbert Batman, M.D., FACS  Assistant:                      Annye English, MD   Indication for Assistant: Intense chronic inflammation, complex exposure, assistant indicated to reduce incidence of intraoperative and postoperative complications  Operative Indications:    This is a 72 year old gentleman who returns following hospitalization 1 month ago for acute cholecystitis with sepsis. His PCP is [facility].     He was hospitalized at [facility] from January 16 through January 25. When he was admitted he was confused, septic, somewhat combative. It was not exactly sure what his problem was at first but it was thought to be either cholecystitis or cholangitis. He was so agitated we could not get a good quality MRCP, hepatobiliary scan. The hepatobiliary scan did suggest a patent common bile duct was patent but cystic duct patency could not be assessed due to poor cooperation. After 36 hours in the hospital it seemed that he was more tender in the right upper quadrant. In the early morning hours of January 18 we took him to the intensive care unit, intubated him and sedated and paralyzed him. We were then able to get a CT scan which suggested acute cholecystitis and stones were seen. CBD was 8.5 mm. He had some pleural effusions and a little bit of free abdominal fluid but no signs of any primary hepatic, gastric, small or large bowel disease. Percutaneous cholecystostomy was performed. Cultures ultimately grew Escherichia coli, enterococcus and Klebsiella pneumonia. After 48 hours he improved and was extubated and continued  to improve. Acute respiratory failure resolved. Hypertension came under control. Diabetes came under control. Acute kidney injury improved     He now is feeling well.Mental status normal. Intelligent. Good insight. He is independent and driving his car. He has no significant pain. Appetite normal. Stools are a little bit more frequent and a little bit softer than usual. Occasional diarrhea. Blood work on February 16 at his PCPs office shows normal liver function tests. Creatinine 1.77.       Past history reveals no prior abdominal operations. 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 Borderline sleep apnea. Depression. Controlled GERD. Hypertension. BMI 34       We had a very long talk about what happened to him. We both agree that we should proceed with definitive cholecystectomy. He knows that the risks are increased for conversion to open surgery and adjacent organ injury due to his sepsis. Nevertheless this is the appropriate next step and he is ready to go ahead. He will be scheduled for laparoscopic cholecystectomy with cholangiogram, possible open cholecystectomy    Operative Findings:  There were omental adhesions to the gallbladder which came down without complication bleeding or difficulty.  The gallbladder was thickened and chronically inflamed.  Dissection of the gallbladder out of its bed was slow and tedious due to chronic inflammation and we had a little bit more bleeding than usual, which was expected in this setting.  The cholangiogram showed that the contrast filled the intrahepatic and extrahepatic biliary system, there were no filling defects, and the contrast promptly went into the duodenum.  We had a nice window behind the cystic duct and on the cholangiogram we could see the valves of Heister at the cholangiogram catheter insertion site.  4 quadrant inspection was otherwise unremarkable.  Small bowel large bowel stomach duodenum and  liver otherwise looked healthy.  Procedure in Detail:          Following the induction of general endotracheal anesthesia the entire abdomen including the percutaneous drain were prepped and draped in a sterile fashion.  Intravenous antibiotics were given.  A surgical timeout was performed.  0.5% Marcaine with epinephrine was used as local infiltration anesthetic.     A vertical incision was made at the superior rim of the umbilicus.  The fascia was incised and the abdominal cavity entered under direct vision.  An 11 mm trocar was inserted and secured with the pursestring suture of 0 Vicryl.  Pneumoperitoneum was created.  Video camera was inserted.  A trocar was placed in the subxiphoid region and two  5 mm trochars placed in the right upper quadrant.  We can see the drain going transperitoneal into the fundus of the gallbladder.  We slowly dissected the adhesions off until we had isolated the drain.  We grab the gallbladder, cut the string, and pulled the drain out of the gallbladder and removed it from the patient's body and discarded it.  We took some adhesions down.  We were able to elevate the infundibulum.  The gallbladder was somewhat large and very long.  We dissected out the cystic duct and the cystic artery.  I created a large window behind the cystic duct.  Catheter was inserted into the cystic duct and a cholangiogram was obtained with findings as described above.  The catheter was removed, the cystic duct secured with multiple medical hips and divided.  I isolated the anterior branch and the posterior branch of the cystic artery separately, controlled and with metal clips and divided them.  Gallbladder was dissected from its bed with electrocautery, placed in a specimen bag and removed.  As had to cauterize the bed of the gallbladder fair amount with the bleeding seemed to be under control.  Irrigation fluid became completely clear.  I applied Arista hemostatic powder to the entire gallbladder bed  and after 5 minutes of observation he did not appear to be any bleeding.  The omentum was pushed back up under the liver.      The drains were removed under direct vision.  The pneumoperitoneum was released.  The fascia at the umbilicus was closed with 0 Vicryl sutures.  Skin incisions were irrigated and the skin closed with subcuticular 4-0 Monocryl and Dermabond.  Patient tolerated the procedure well and was taken to PACU in stable condition.  EBL 30-40 cc.  Counts correct.  Complications none.   Addendum: I logged on to the [facility] website and reviewed his prescription medication history    Laurian Edrington M. Dalbert Batman, M.D., FACS General and Minimally Invasive Surgery Breast and Colorectal Surgery  12/20/2017 9:34 AM

## 2017-12-20 NOTE — Anesthesia Procedure Notes (Signed)
Procedure Name: Intubation Date/Time: 12/20/2017 7:38 AM Performed by: Raenette Rover, CRNA Pre-anesthesia Checklist: Patient identified, Emergency Drugs available, Suction available and Patient being monitored Patient Re-evaluated:Patient Re-evaluated prior to induction Oxygen Delivery Method: Circle system utilized Preoxygenation: Pre-oxygenation with 100% oxygen Induction Type: IV induction Ventilation: Mask ventilation without difficulty Laryngoscope Size: Mac and 3 Grade View: Grade I Tube type: Oral Tube size: 7.5 mm Number of attempts: 1 Airway Equipment and Method: Stylet Placement Confirmation: ETT inserted through vocal cords under direct vision,  positive ETCO2,  CO2 detector and breath sounds checked- equal and bilateral Secured at: 21 cm Tube secured with: Tape Dental Injury: Teeth and Oropharynx as per pre-operative assessment

## 2017-12-20 NOTE — Anesthesia Postprocedure Evaluation (Signed)
Anesthesia Post Note  Patient: Timothy Chapman  Procedure(s) Performed: LAPAROSCOPIC CHOLECYSTECTOMY WITH INTRAOPERATIVE CHOLANGIOGRAM POSSIBLE OPEN (N/A Abdomen)     Patient location during evaluation: PACU Anesthesia Type: General Level of consciousness: awake and alert Pain management: pain level controlled Vital Signs Assessment: post-procedure vital signs reviewed and stable Respiratory status: spontaneous breathing, nonlabored ventilation and respiratory function stable Cardiovascular status: blood pressure returned to baseline and stable Postop Assessment: no apparent nausea or vomiting Anesthetic complications: no    Last Vitals:  Vitals:   12/20/17 1030 12/20/17 1056  BP: (!) 161/79   Pulse: 93 87  Resp: 19 (!) 26  Temp:    SpO2: 98% 99%    Last Pain:  Vitals:   12/20/17 1056  TempSrc:   PainSc: 7                  Audry Pili

## 2017-12-20 NOTE — Transfer of Care (Signed)
Immediate Anesthesia Transfer of Care Note  Patient: Timothy Chapman  Procedure(s) Performed: LAPAROSCOPIC CHOLECYSTECTOMY WITH INTRAOPERATIVE CHOLANGIOGRAM POSSIBLE OPEN (N/A Abdomen)  Patient Location: PACU  Anesthesia Type:General  Level of Consciousness: awake, alert , oriented and patient cooperative  Airway & Oxygen Therapy: Patient Spontanous Breathing and Patient connected to face mask oxygen  Post-op Assessment: Report given to RN and Post -op Vital signs reviewed and stable  Post vital signs: Reviewed and stable  Last Vitals:  Vitals Value Taken Time  BP 169/68 12/20/2017  9:31 AM  Temp    Pulse 79 12/20/2017  9:32 AM  Resp 23 12/20/2017  9:32 AM  SpO2 100 % 12/20/2017  9:32 AM  Vitals shown include unvalidated device data.  Last Pain:  Vitals:   12/20/17 0932  TempSrc:   PainSc: (P) Asleep         Complications: No apparent anesthesia complications

## 2017-12-20 NOTE — Interval H&P Note (Signed)
History and Physical Interval Note:  12/20/2017 5:41 AM  Timothy Chapman  has presented today for surgery, with the diagnosis of ACUTE CHOLECYSTITITS  The various methods of treatment have been discussed with the patient and family. After consideration of risks, benefits and other options for treatment, the patient has consented to  Procedure(s): LAPAROSCOPIC CHOLECYSTECTOMY WITH INTRAOPERATIVE CHOLANGIOGRAM POSSIBLE OPEN (N/A) as a surgical intervention .  The patient's history has been reviewed, patient examined, no change in status, stable for surgery.  I have reviewed the patient's chart and labs.  Questions were answered to the patient's satisfaction.     Adin Hector

## 2017-12-20 NOTE — Discharge Instructions (Signed)
[facility] ______[facility] °LAPAROSCOPIC SURGERY: POST OP INSTRUCTIONS °Always review your discharge instruction sheet given to you by the facility where your surgery was performed. °IF YOU HAVE DISABILITY OR FAMILY LEAVE FORMS, YOU MUST BRING THEM TO THE OFFICE FOR PROCESSING.   °DO NOT GIVE THEM TO YOUR DOCTOR. ° °1. A prescription for pain medication may be given to you upon discharge.  Take your pain medication as prescribed, if needed.  If narcotic pain medicine is not needed, then you may take acetaminophen (Tylenol) or ibuprofen (Advil) as needed. °2. Take your usually prescribed medications unless otherwise directed. °3. If you need a refill on your pain medication, please contact your pharmacy.  They will contact our office to request authorization. Prescriptions will not be filled after 5pm or on week-ends. °4. You should follow a light diet the first few days after arrival home, such as soup and crackers, etc.  Be sure to include lots of fluids daily. °5. Most patients will experience some swelling and bruising in the area of the incisions.  Ice packs will help.  Swelling and bruising can take several days to resolve.  °6. It is common to experience some constipation if taking pain medication after surgery.  Increasing fluid intake and taking a stool softener (such as Colace) will usually help or prevent this problem from occurring.  A mild laxative (Milk of Magnesia or Miralax) should be taken according to package instructions if there are no bowel movements after 48 hours. °7. Unless discharge instructions indicate otherwise, you may remove your bandages 24-48 hours after surgery, and you may shower at that time.  You may have steri-strips (small skin tapes) in place directly over the incision.  These strips should be left on the skin for 7-10 days.  If your surgeon used skin glue on the incision, you may shower in 24 hours.  The glue will flake off over the next 2-3 weeks.  Any sutures or  staples will be removed at the office during your follow-up visit. °8. ACTIVITIES:  You may resume regular (light) daily activities beginning the next day--such as daily self-care, walking, climbing stairs--gradually increasing activities as tolerated.  You may have sexual intercourse when it is comfortable.  Refrain from any heavy lifting or straining until approved by your doctor. °a. You may drive when you are no longer taking prescription pain medication, you can comfortably wear a seatbelt, and you can safely maneuver your car and apply brakes. °b. RETURN TO WORK:  __________________________________________________________ °9. You should see your doctor in the office for a follow-up appointment approximately 2-3 weeks after your surgery.  Make sure that you call for this appointment within a day or two after you arrive home to insure a convenient appointment time. °10. OTHER INSTRUCTIONS: __________________________________________________________________________________________________________________________ __________________________________________________________________________________________________________________________ °WHEN TO CALL YOUR DOCTOR: °1. Fever over 101.0 °2. Inability to urinate °3. Continued bleeding from incision. °4. Increased pain, redness, or drainage from the incision. °5. Increasing abdominal pain ° °The clinic staff is available to answer your questions during regular business hours.  Please don’t hesitate to call and ask to speak to one of the nurses for clinical concerns.  If you have a medical emergency, go to the nearest emergency room or call 911.  A surgeon from [facility] is always on call at the hospital. °[address], [address], [place], [place]  [address] ? [address] [address], [place], [place]   [address] °[phone] ? [phone] ? FAX [phone] °Web site:   [email or web address] °

## 2017-12-21 ENCOUNTER — Encounter (HOSPITAL_COMMUNITY): Payer: Self-pay | Admitting: General Surgery

## 2017-12-25 DIAGNOSIS — R69 Illness, unspecified: Secondary | ICD-10-CM | POA: Diagnosis not present

## 2017-12-25 DIAGNOSIS — I10 Essential (primary) hypertension: Secondary | ICD-10-CM | POA: Diagnosis not present

## 2017-12-25 DIAGNOSIS — Z7982 Long term (current) use of aspirin: Secondary | ICD-10-CM | POA: Diagnosis not present

## 2017-12-25 DIAGNOSIS — Z87891 Personal history of nicotine dependence: Secondary | ICD-10-CM | POA: Diagnosis not present

## 2017-12-25 DIAGNOSIS — G4730 Sleep apnea, unspecified: Secondary | ICD-10-CM | POA: Diagnosis not present

## 2017-12-25 DIAGNOSIS — Z7984 Long term (current) use of oral hypoglycemic drugs: Secondary | ICD-10-CM | POA: Diagnosis not present

## 2017-12-25 DIAGNOSIS — Z4803 Encounter for change or removal of drains: Secondary | ICD-10-CM | POA: Diagnosis not present

## 2017-12-25 DIAGNOSIS — E119 Type 2 diabetes mellitus without complications: Secondary | ICD-10-CM | POA: Diagnosis not present

## 2017-12-25 DIAGNOSIS — K8309 Other cholangitis: Secondary | ICD-10-CM | POA: Diagnosis not present

## 2017-12-26 ENCOUNTER — Other Ambulatory Visit: Payer: Self-pay | Admitting: Emergency Medicine

## 2017-12-26 ENCOUNTER — Encounter: Payer: Self-pay | Admitting: Family Medicine

## 2017-12-26 DIAGNOSIS — I10 Essential (primary) hypertension: Secondary | ICD-10-CM

## 2017-12-26 MED ORDER — AMLODIPINE BESYLATE 5 MG PO TABS: ORAL_TABLET | ORAL | 0 refills | Status: DC

## 2017-12-26 MED ORDER — AMLODIPINE BESYLATE 10 MG PO TABS: ORAL_TABLET | ORAL | 3 refills | Status: DC

## 2017-12-26 NOTE — Telephone Encounter (Signed)
Dr.Copland the refill of amlodipine has been sent. Pt would like to know if you requested home care. Home care is still coming to the home and the pt is unsure why. Please advise.

## 2017-12-30 DIAGNOSIS — Z7982 Long term (current) use of aspirin: Secondary | ICD-10-CM | POA: Diagnosis not present

## 2017-12-30 DIAGNOSIS — Z4803 Encounter for change or removal of drains: Secondary | ICD-10-CM | POA: Diagnosis not present

## 2017-12-30 DIAGNOSIS — E119 Type 2 diabetes mellitus without complications: Secondary | ICD-10-CM | POA: Diagnosis not present

## 2017-12-30 DIAGNOSIS — Z87891 Personal history of nicotine dependence: Secondary | ICD-10-CM | POA: Diagnosis not present

## 2017-12-30 DIAGNOSIS — Z7984 Long term (current) use of oral hypoglycemic drugs: Secondary | ICD-10-CM | POA: Diagnosis not present

## 2017-12-30 DIAGNOSIS — K8309 Other cholangitis: Secondary | ICD-10-CM | POA: Diagnosis not present

## 2017-12-30 DIAGNOSIS — G4730 Sleep apnea, unspecified: Secondary | ICD-10-CM | POA: Diagnosis not present

## 2017-12-30 DIAGNOSIS — R69 Illness, unspecified: Secondary | ICD-10-CM | POA: Diagnosis not present

## 2017-12-30 DIAGNOSIS — I10 Essential (primary) hypertension: Secondary | ICD-10-CM | POA: Diagnosis not present

## 2018-01-07 DIAGNOSIS — Z7982 Long term (current) use of aspirin: Secondary | ICD-10-CM | POA: Diagnosis not present

## 2018-01-07 DIAGNOSIS — E119 Type 2 diabetes mellitus without complications: Secondary | ICD-10-CM | POA: Diagnosis not present

## 2018-01-07 DIAGNOSIS — R69 Illness, unspecified: Secondary | ICD-10-CM | POA: Diagnosis not present

## 2018-01-07 DIAGNOSIS — I10 Essential (primary) hypertension: Secondary | ICD-10-CM | POA: Diagnosis not present

## 2018-01-07 DIAGNOSIS — Z87891 Personal history of nicotine dependence: Secondary | ICD-10-CM | POA: Diagnosis not present

## 2018-01-07 DIAGNOSIS — K8309 Other cholangitis: Secondary | ICD-10-CM | POA: Diagnosis not present

## 2018-01-07 DIAGNOSIS — G4730 Sleep apnea, unspecified: Secondary | ICD-10-CM | POA: Diagnosis not present

## 2018-01-07 DIAGNOSIS — Z7984 Long term (current) use of oral hypoglycemic drugs: Secondary | ICD-10-CM | POA: Diagnosis not present

## 2018-01-07 DIAGNOSIS — Z4803 Encounter for change or removal of drains: Secondary | ICD-10-CM | POA: Diagnosis not present

## 2018-01-15 ENCOUNTER — Ambulatory Visit: Payer: Medicare HMO

## 2018-01-15 ENCOUNTER — Encounter: Payer: Self-pay | Admitting: Cardiology

## 2018-01-15 ENCOUNTER — Ambulatory Visit (INDEPENDENT_AMBULATORY_CARE_PROVIDER_SITE_OTHER): Payer: Medicare HMO | Admitting: Cardiology

## 2018-01-15 VITALS — BP 140/72 | HR 80 | Ht 69.0 in | Wt 238.1 lb

## 2018-01-15 DIAGNOSIS — I10 Essential (primary) hypertension: Secondary | ICD-10-CM | POA: Diagnosis not present

## 2018-01-15 DIAGNOSIS — I480 Paroxysmal atrial fibrillation: Secondary | ICD-10-CM

## 2018-01-15 DIAGNOSIS — E1140 Type 2 diabetes mellitus with diabetic neuropathy, unspecified: Secondary | ICD-10-CM | POA: Diagnosis not present

## 2018-01-15 MED ORDER — BENAZEPRIL HCL 10 MG PO TABS: 30.0000 mg | ORAL_TABLET | Freq: Every day | ORAL | 6 refills | Status: DC

## 2018-01-15 MED FILL — BENAZEPRIL HCL 10 MG TABLET: 10 | 10 days supply | Qty: 30 | Fill #0

## 2018-01-15 NOTE — Progress Notes (Signed)
Cardiology Office Note:    Date:  01/15/2018   ID:  [name], DOB 03-29-1946, MRN [id number]  PCP:  Timothy Mclean, MD  Cardiologist:  Jenne Campus, MD    Referring MD: Timothy Mclean, MD   Chief Complaint  Patient presents with  . Follow-up  . Hypertension  I am doing better  History of Present Illness:    Timothy Chapman is a 72 y.o. male with diabetes, recent episode of cholecystitis with sepsis.  While he was in the hospital he developed atrial fibrillation however converted spontaneously.  After that I saw him before planned elective cholecystectomy.  Echocardiogram was done showed only mild left atrial enlargement.  He went to surgery with no difficulties.  Doing well recovering 3 weeks after surgery doing well denies have any palpitations of course the biggest question now is if he still have episode of atrial fibrillation or did he have atrial fibrillation just related to profound sickness.  I spent a great deal of time trying to convince him to wear a event recorder he agreed to wear it for 2 weeks.  Another issue is dyslipidemia.  He is intolerant to statin he takes only Niaspan I understand this is for triglyceridemia.  His LDL last time check was 77 I started conversation about taking different kind of medication to reduce his LDL I including injectable medication however he is definitely against that.  We also talked about his high blood pressure.  He reduce the dose of Lotensin.  He reduce dose to 20 mg by himself because he thought his blood pressure was too low his blood pressure was 130/60.  I told him that this is still blood pressure that need to be reduced on the multiple benefits from ACE inhibitor.  He agreed to try Lotensin 30 mg.  He said he cannot break 10 mill grams tablets and a half therefore we will send him 10 mg tablets so he can take a total of 30 mg every single day.  Another complaint that he got about Lotensin is the fact that caused the ED.  We  try to compromise and see if he will be able to tolerate 30 mg.  Past Medical History:  Diagnosis Date  . AKI (acute kidney injury) ([facility])   . Anxiety   . Arthritis   . Barrett esophagus   . Depression   . Diabetes mellitus without complication ([name])    type 2  . GERD (gastroesophageal reflux disease)   . Hypertension   . Neuromuscular disorder (H[facility])    neuropathy but being treated  . PONV (postoperative nausea and vomiting)   . Sleep apnea    has a sleep study but refuses that he has sleep study, not wearing CPAP    Past Surgical History:  Procedure Laterality Date  . BACK SURGERY     X2 - lower  . CHOLECYSTECTOMY N/A 12/20/2017   Procedure: LAPAROSCOPIC CHOLECYSTECTOMY WITH INTRAOPERATIVE CHOLANGIOGRAM POSSIBLE OPEN;  Surgeon: Timothy Skates, MD;  Location: [facility];  Service: General;  Laterality: N/A;  . COLONOSCOPY    . ESOPHAGOGASTRODUODENOSCOPY    . IR RADIOLOGIST EVAL & MGMT  10/30/2017  . IR RADIOLOGIST EVAL & MGMT  11/14/2017  . REPLACEMENT TOTAL KNEE Left   . ROTATOR CUFF REPAIR      Current Medications: Current Meds  Medication Sig  . Acetylcarnitine HCl 500 MG CAPS Take 500 mg by mouth daily.  . Alpha-Lipoic Acid 600 MG CAPS Take 600 mg  by mouth daily.  Marland Kitchen amLODipine (NORVASC) 10 MG tablet Take 10 mg daily.  Increase to 10 mg as directed  . aspirin 81 MG chewable tablet Chew 1 tablet (81 mg total) by mouth daily.  . benazepril (LOTENSIN) 40 MG tablet Take 1 tablet (40 mg total) by mouth daily. (Patient taking differently: Take 20 mg by mouth daily. )  . diclofenac (VOLTAREN) 75 MG EC tablet Take 1 tablet (75 mg total) by mouth 2 (two) times daily. Use as needed for joint pains  . famotidine (PEPCID) 20 MG tablet Take 1 tablet (20 mg total) by mouth 2 (two) times daily.  Marland Kitchen glipiZIDE (GLUCOTROL) 10 MG tablet Take 1 tablet (10 mg total) by mouth 2 (two) times daily before a meal.  . hydrochlorothiazide (HYDRODIURIL) 25 MG tablet Take 1 tablet (25 mg total) by mouth  daily.  Marland Kitchen HYDROcodone-acetaminophen (NORCO) 5-325 MG tablet Take 1-2 tablets by mouth every 6 (six) hours as needed for moderate pain or severe pain. (Patient taking differently: Take 1-2 tablets by mouth as needed for moderate pain or severe pain. )  . liraglutide (VICTOZA) 18 MG/3ML SOPN Inject 1.8 mg [place] daily  . Melatonin 3 MG TABS Take 1 tablet by mouth 2 (two) times daily.   . metFORMIN (GLUCOPHAGE) 1000 MG tablet Take 1 tablet (1,000 mg total) by mouth 2 (two) times daily with a meal.  . Multiple Vitamin (MULTIVITAMIN) tablet Take 1 tablet by mouth daily.  . niacin (NIASPAN) 500 MG CR tablet Take 1 tablet (500 mg total) by mouth at bedtime.  Marland Kitchen omeprazole (PRILOSEC) 40 MG capsule Take 1 capsule (40 mg total) by mouth daily.  . tadalafil (CIALIS) 5 MG tablet Take 1 tablet (5 mg total) daily as needed by mouth for erectile dysfunction (and BPH). (Patient taking differently: Take 5 mg by mouth daily. )  . Turmeric 450 MG CAPS Take 1 tablet by mouth daily.   . vitamin C (ASCORBIC ACID) 500 MG tablet Take 500 mg by mouth daily.     Allergies:   Codeine   Social History   Socioeconomic History  . Marital status: Married    Spouse name: Not on file  . Number of children: Not on file  . Years of education: Not on file  . Highest education level: Not on file  Occupational History  . Not on file  Social Needs  . Financial resource strain: Not on file  . Food insecurity:    Worry: Not on file    Inability: Not on file  . Transportation needs:    Medical: Not on file    Non-medical: Not on file  Tobacco Use  . Smoking status: Former Smoker    Last attempt to quit: 09/18/1975    Years since quitting: 42.3  . Smokeless tobacco: Never Used  Substance and Sexual Activity  . Alcohol use: Yes    Alcohol/week: 0.0 oz    Comment: Rarely  . Drug use: Never  . Sexual activity: Not on file  Lifestyle  . Physical activity:    Days per week: Not on file    Minutes per session: Not on file    . Stress: Not on file  Relationships  . Social connections:    Talks on phone: Not on file    Gets together: Not on file    Attends religious service: Not on file    Active member of club or organization: Not on file    Attends meetings of clubs or  organizations: Not on file    Relationship status: Not on file  Other Topics Concern  . Not on file  Social History Narrative  . Not on file     Family History: The patient's family history includes Emphysema in his mother; Heart disease in his father; Heart failure in his father. ROS:   Please see the history of present illness.    All 14 point review of systems negative except as described per history of present illness  EKGs/Labs/Other Studies Reviewed:      Recent Labs: 10/07/2017: Magnesium 1.9 10/23/2017: Pro B Natriuretic peptide (BNP) 165.0 12/12/2017: ALT 116; BUN 28; Creatinine, Ser 1.21; Platelets 230 12/20/2017: Hemoglobin 11.2; Potassium 4.8; Sodium 137  Recent Lipid Panel    Component Value Date/Time   CHOL 141 07/09/2017 0826   TRIG 362 (H) 10/07/2017 0746   HDL 25.70 (L) 07/09/2017 0826   CHOLHDL 5 07/09/2017 0826   VLDL 57.0 (H) 07/09/2017 0826   LDLDIRECT 77.0 07/09/2017 0826    Physical Exam:    VS:  BP 140/72 (BP Location: Right Arm, Patient Position: Sitting, Cuff Size: Normal)   Pulse 80   Ht 5\' 9"  (1.753 m)   Wt 238 lb 1.9 oz (108 kg)   SpO2 98%   BMI 35.16 kg/m     Wt Readings from Last 3 Encounters:  01/15/18 238 lb 1.9 oz (108 kg)  12/20/17 232 lb (105.2 kg)  12/16/17 234 lb (106.1 kg)     GEN:  Well nourished, well developed in no acute distress HEENT: Normal NECK: No JVD; No carotid bruits LYMPHATICS: No lymphadenopathy CARDIAC: RRR, no murmurs, no rubs, no gallops RESPIRATORY:  Clear to auscultation without rales, wheezing or rhonchi  ABDOMEN: Soft, non-tender, non-distended MUSCULOSKELETAL:  No edema; No deformity  SKIN: Warm and dry LOWER EXTREMITIES: no swelling NEUROLOGIC:   Alert and oriented x 3 PSYCHIATRIC:  Normal affect   ASSESSMENT:    1. Paroxysmal atrial fibrillation (HCC)   2. Controlled type 2 diabetes mellitus with diabetic neuropathy, without long-term current use of insulin ([place])   3. HYPERTENSION, BENIGN ESSENTIAL, UNCONTROLLED    PLAN:    In order of problems listed above:  1. Paroxysmal atrial fibrillation: Plan as outlined above it would be extremely essential to know if he still gets episode of atrial fibrillation if that is the case anticoagulation will be required. 2. Type 2 diabetes: Apparently his sugar is elevated however he works hard with his primary care physician to get it better. 3. Essential hypertension: plan as outlined above   Medication Adjustments/Labs and Tests Ordered: Current medicines are reviewed at length with the patient today.  Concerns regarding medicines are outlined above.  No orders of the defined types were placed in this encounter.  Medication changes: No orders of the defined types were placed in this encounter.   Signed, Park Liter, MD, [facility] 01/15/2018 2:31 PM    [facility]

## 2018-01-15 NOTE — Patient Instructions (Signed)
Medication Instructions:  Your physician has recommended you make the following change in your medication:  DECREASE lotensin 30 mg daily  Labwork: None  Testing/Procedures: Your physician has recommended that you wear a holter monitor. Holter monitors are medical devices that record the heart's electrical activity. Doctors most often use these monitors to diagnose arrhythmias. Arrhythmias are problems with the speed or rhythm of the heartbeat. The monitor is a small, portable device. You can wear one while you do your normal daily activities. This is usually used to diagnose what is causing palpitations/syncope (passing out). Wear for 14 days.   Follow-Up: Your physician recommends that you schedule a follow-up appointment in: 2 months.   Any Other Special Instructions Will Be Listed Below (If Applicable).     If you need a refill on your cardiac medications before your next appointment, please call your pharmacy.

## 2018-01-21 ENCOUNTER — Encounter: Payer: Self-pay | Admitting: Family Medicine

## 2018-01-21 DIAGNOSIS — I10 Essential (primary) hypertension: Secondary | ICD-10-CM

## 2018-01-21 DIAGNOSIS — E1140 Type 2 diabetes mellitus with diabetic neuropathy, unspecified: Secondary | ICD-10-CM

## 2018-01-21 MED ORDER — LANCETS ULTRA FINE MISC: 3 refills | Status: DC

## 2018-01-21 MED ORDER — GLUCOSE BLOOD VI STRP: ORAL_STRIP | 3 refills | Status: DC

## 2018-01-22 DIAGNOSIS — R69 Illness, unspecified: Secondary | ICD-10-CM | POA: Diagnosis not present

## 2018-01-24 MED ORDER — AMLODIPINE BESYLATE 10 MG PO TABS: ORAL_TABLET | ORAL | 3 refills | Status: DC

## 2018-01-24 MED ORDER — BENAZEPRIL HCL 10 MG PO TABS: 30.0000 mg | ORAL_TABLET | Freq: Every day | ORAL | 3 refills | Status: DC

## 2018-01-24 MED ORDER — HYDROCHLOROTHIAZIDE 25 MG PO TABS: 25.0000 mg | ORAL_TABLET | Freq: Every day | ORAL | 3 refills | Status: DC

## 2018-01-24 NOTE — Addendum Note (Signed)
Addended by: Lamar Blinks C on: 01/24/2018 12:57 PM   Modules accepted: Orders

## 2018-01-27 ENCOUNTER — Other Ambulatory Visit: Payer: Self-pay | Admitting: Family Medicine

## 2018-01-27 NOTE — Telephone Encounter (Signed)
Copied from [place] [id number]. Topic: Quick Communication - Rx Refill/Question >> Jan 27, 2018  6:26 PM Tye [place] wrote: Medication: glucose blood test strip [[id number]]   Medication above needs to be switched the pharmacist states the pt is needing the One Touch Verio Test Strips  Has the patient contacted their pharmacy? Yes.   (Agent: If no, request that the patient contact the pharmacy for the refill.) Preferred Pharmacy (with phone number or street name): [facility] [phone] reference # [id number]  Agent: Please be advised that RX refills may take up to 3 business days. We ask that you follow-up with your pharmacy.

## 2018-01-28 ENCOUNTER — Telehealth: Payer: Self-pay | Admitting: *Deleted

## 2018-01-28 NOTE — Telephone Encounter (Signed)
Received Home Health Certification and Plan of Care; forwarded to provider/SLS 05/14  

## 2018-01-29 DIAGNOSIS — R69 Illness, unspecified: Secondary | ICD-10-CM | POA: Diagnosis not present

## 2018-01-29 MED ORDER — GLUCOSE BLOOD VI STRP: ORAL_STRIP | 12 refills | Status: DC

## 2018-01-29 NOTE — Telephone Encounter (Signed)
Verio test strips ordered electronically, confirmed type with [facility] pharmacy over the phone. Routed to Dr. Lorelei Pont.

## 2018-01-29 NOTE — Telephone Encounter (Signed)
Lakshmi with [facility] is calling to follow up on request to change test strips to the one touch verio. She states this is the last attempt they will make for this request. If there is no response they will notify the patient to contact us directly. Please advise.  Ref# [id number]

## 2018-01-31 ENCOUNTER — Encounter: Payer: Self-pay | Admitting: Family Medicine

## 2018-02-27 DIAGNOSIS — G8929 Other chronic pain: Secondary | ICD-10-CM | POA: Diagnosis not present

## 2018-02-27 DIAGNOSIS — Z7984 Long term (current) use of oral hypoglycemic drugs: Secondary | ICD-10-CM | POA: Diagnosis not present

## 2018-02-27 DIAGNOSIS — E1140 Type 2 diabetes mellitus with diabetic neuropathy, unspecified: Secondary | ICD-10-CM | POA: Diagnosis not present

## 2018-02-27 DIAGNOSIS — E669 Obesity, unspecified: Secondary | ICD-10-CM | POA: Diagnosis not present

## 2018-02-27 DIAGNOSIS — Z791 Long term (current) use of non-steroidal anti-inflammatories (NSAID): Secondary | ICD-10-CM | POA: Diagnosis not present

## 2018-02-27 DIAGNOSIS — E1122 Type 2 diabetes mellitus with diabetic chronic kidney disease: Secondary | ICD-10-CM | POA: Diagnosis not present

## 2018-02-27 DIAGNOSIS — N183 Chronic kidney disease, stage 3 (moderate): Secondary | ICD-10-CM | POA: Diagnosis not present

## 2018-02-27 DIAGNOSIS — N529 Male erectile dysfunction, unspecified: Secondary | ICD-10-CM | POA: Diagnosis not present

## 2018-02-27 DIAGNOSIS — K219 Gastro-esophageal reflux disease without esophagitis: Secondary | ICD-10-CM | POA: Diagnosis not present

## 2018-02-27 DIAGNOSIS — I129 Hypertensive chronic kidney disease with stage 1 through stage 4 chronic kidney disease, or unspecified chronic kidney disease: Secondary | ICD-10-CM | POA: Diagnosis not present

## 2018-03-17 DIAGNOSIS — M545 Low back pain: Secondary | ICD-10-CM | POA: Diagnosis not present

## 2018-03-17 DIAGNOSIS — Z5181 Encounter for therapeutic drug level monitoring: Secondary | ICD-10-CM | POA: Diagnosis not present

## 2018-03-17 DIAGNOSIS — R69 Illness, unspecified: Secondary | ICD-10-CM | POA: Diagnosis not present

## 2018-03-21 ENCOUNTER — Other Ambulatory Visit: Payer: Self-pay

## 2018-03-21 DIAGNOSIS — I10 Essential (primary) hypertension: Secondary | ICD-10-CM

## 2018-03-21 MED ORDER — BENAZEPRIL HCL 10 MG PO TABS: 30.0000 mg | ORAL_TABLET | Freq: Every day | ORAL | 1 refills | Status: DC

## 2018-03-25 ENCOUNTER — Telehealth: Payer: Self-pay

## 2018-03-25 NOTE — Telephone Encounter (Signed)
Author phoned pt. To relay [facility] per phone call via [facility], author confirmed dosage of 10mg  (3 tabs) daily [facility]; rx sent to [facility] on 7/5 cancelled over the phone by author. Pt. verbalized understanding, and stated his BP has been higher than usual, as well as his BG, but pt. states he generally feels ok. Fasting BG in AM lately in the 150s. Pt. has an appointment with Dr. Lorelei Pont 7/15. Routed to Dr. Lorelei Pont as Juluis Rainier.

## 2018-03-29 NOTE — Progress Notes (Addendum)
[place] at [facility] [address] [place], [place] [address] [phone]  Date:  03/31/2018   Name:  Timothy Prosperi [facility]   DOB:  02-Feb-1946   MRN:  [id number]  PCP:  [name], MD    Chief Complaint: Bloated (3 months ago gallbladder removal, indegestion, no vomiting) and Diarrhea   History of Present Illness:  Timothy [facility] is a 72 y.o. very pleasant male patient who presents with the following:  Following up today History of DM, a fib He was quite sick at the beginning of this year with cholangitis, admitted for several days and had acute renal insult.  He had a lap chole in April   He saw cardiology at the beginning of May: Timothy [facility] is a 72 y.o. male with diabetes, recent episode of cholecystitis with sepsis.  While he was in the hospital he developed atrial fibrillation however converted spontaneously.  After that I saw him before planned elective cholecystectomy.  Echocardiogram was done showed only mild left atrial enlargement.  He went to surgery with no difficulties.  Doing well recovering 3 weeks after surgery doing well denies have any palpitations of course the biggest question now is if he still have episode of atrial fibrillation or did he have atrial fibrillation just related to profound sickness.  I spent a great deal of time trying to convince him to wear a event recorder he agreed to wear it for 2 weeks.  Another issue is dyslipidemia.  He is intolerant to statin he takes only Niaspan I understand this is for triglyceridemia.  His LDL last time check was 72 I started conversation about taking different kind of medication to reduce his LDL I including injectable medication however he is definitely against that.  We also talked about his high blood pressure.  He reduce the dose of Lotensin.  He reduce dose to 20 mg by himself because he thought his blood pressure was too low his blood pressure was 130/60.  I  told him that this is still blood pressure that need to be reduced on the multiple benefits from ACE inhibitor.  He agreed to try Lotensin 30 mg.  He said he cannot break 10 mill grams tablets and a half therefore we will send him 10 mg tablets so he can take a total of 30 mg every single day.  Another complaint that he got about Lotensin is the fact that caused the ED.  We try to compromise and see if he will be able to tolerate 30 mg  I last saw him in March- we have been working on getting his BP regimen back to normal following changes while inpt: His BP meds were changed during his inpt stay due to ARI- we have been working on getting him back on a good regimen.  See several phone and mychart messages over the last 2 weeks  He is taking currently:  amlodipine 5mg  benazapril 40 mg today He is back on hctz 25 He stopped hydralazine and labetolol - no longer taking either at all He has a lap chole planned for 3/22 per Dr. Dalbert Batman- they also plan an intraoperative cholangiogram and will take out his drain  He is eating pretty well again- not as much as he used to   He went to the [place] ophthalmologist recently and was told he has some changes to the back of the eye- ?hypertensive retinopathy.  They plan to  treat this with an intraorbital injection which we hope will help with his vision.  We are working on getting his BP controlled again. Not dangerously high today but still above goal   RecentLabs  Lab Results  Component Value Date   HGBA1C 6.8 (H) 10/23/2017     He is back on his glipizide 10 BID, metformin 100 BID- he is not back on victoza yet.  He will watch his glucose and add this back when it seems necessary   See also detailed notes by his optho at [facility], Dr. Manuella Ghazi  Today need to do A1c, routine labs Do foot exam today He reports that he did his cardiac event monitor and was normal, nothing further needed as far as arrythmia  He has noted some bloating and diarrhea- this seems to  have started after his gallbladder was removed.  He does not have pain, but his belly seems to be larger than it was before probiotics have helped with his diarrhea No vomiting He is able to tolerate some fatty foods now - right after his operation he was having a lot of trouble with anything greasy.   However his GI system does not seem to quite work like it did in the past  He did try some otc simethicone  BP Readings from Last 3 Encounters:  03/31/18 136/86  01/15/18 140/72  12/20/17 (!) 156/90   amlodipine 10 Asa He is taking benazapril 40 mg total  He was on pepcid but could not get it refilled- I will refill this for him  Glipizide 10 BID hctz 25 daily He is using victoza 1.8 daily Metformin 100 BID niaspan ompetrazole daily   His glucose has been coming up some to 130- 150 in the am.  We will check an a1c today  His diclofenac does not seem to be helping as much - his right hand esp is bothering him more over the last couple of months.  Sometimes the joints will be swollen and painful.  He takes a while to get his hands going in the am   Patient Active Problem List   Diagnosis Date Noted  . Paroxysmal atrial fibrillation ([place]) 12/16/2017  . SBO (small bowel obstruction) ([place]) 10/08/2017  . ATN (acute tubular necrosis) ([place]) 10/08/2017  . Septic shock ([place]) 10/04/2017  . Acute cholecystitis s/p perc cholecystomy drainage 10/04/2017   . Acute respiratory failure with hypoxia ([place])   . AKI (acute kidney injury) ([place])   . Chronic back pain 02/08/2016  . Controlled type 2 diabetes mellitus with diabetic neuropathy, without long-term current use of insulin ([place]) 02/08/2016  . Insomnia 02/08/2016  . History of diverticulitis 02/08/2016  . HYPERTENSION, BENIGN ESSENTIAL, UNCONTROLLED 03/12/2009  . ABNORMAL ELECTROCARDIOGRAM 03/12/2009    Past Medical History:  Diagnosis Date  . AKI (acute kidney injury) ([place])   . Anxiety   . Arthritis   . Barrett esophagus   .  Depression   . Diabetes mellitus without complication ([place])    type 2  . GERD (gastroesophageal reflux disease)   . Hypertension   . Neuromuscular disorder ([facility])    neuropathy but being treated  . PONV (postoperative nausea and vomiting)   . Sleep apnea    has a sleep study but refuses that he has sleep study, not wearing CPAP    Past Surgical History:  Procedure Laterality Date  . BACK SURGERY     X2 - lower  . CHOLECYSTECTOMY N/A 12/20/2017   Procedure: LAPAROSCOPIC CHOLECYSTECTOMY WITH  INTRAOPERATIVE CHOLANGIOGRAM POSSIBLE OPEN;  Surgeon: Fanny Skates, MD;  Location: [facility];  Service: General;  Laterality: N/A;  . COLONOSCOPY    . ESOPHAGOGASTRODUODENOSCOPY    . IR RADIOLOGIST EVAL & MGMT  10/30/2017  . IR RADIOLOGIST EVAL & MGMT  11/14/2017  . REPLACEMENT TOTAL KNEE Left   . ROTATOR CUFF REPAIR      Social History   Tobacco Use  . Smoking status: Former Smoker    Last attempt to quit: 09/18/1975    Years since quitting: 42.5  . Smokeless tobacco: Never Used  Substance Use Topics  . Alcohol use: Yes    Alcohol/week: 0.0 oz    Comment: Rarely  . Drug use: Never    Family History  Problem Relation Age of Onset  . Heart failure Father   . Heart disease Father   . Emphysema Mother     Allergies  Allergen Reactions  . Codeine     Medication list has been reviewed and updated.  Current Outpatient Medications on File Prior to Visit  Medication Sig Dispense Refill  . Acetylcarnitine HCl 500 MG CAPS Take 500 mg by mouth daily.    . Alcohol Swabs (B-D SINGLE USE SWABS REGULAR) PADS     . Alpha-Lipoic Acid 600 MG CAPS Take 600 mg by mouth daily.    Marland Kitchen amLODipine (NORVASC) 10 MG tablet Take 10 mg daily 90 tablet 3  . aspirin 81 MG chewable tablet Chew 1 tablet (81 mg total) by mouth daily. 30 tablet 0  . B-D UF III MINI PEN NEEDLES 31G X 5 MM MISC     . diclofenac (VOLTAREN) 75 MG EC tablet Take 1 tablet (75 mg total) by mouth 2 (two) times daily. Use as needed for  joint pains 180 tablet 3  . glipiZIDE (GLUCOTROL) 10 MG tablet Take 1 tablet (10 mg total) by mouth 2 (two) times daily before a meal. 180 tablet 3  . glucose blood (ONETOUCH VERIO) test strip Use as instructed 100 each 12  . hydrochlorothiazide (HYDRODIURIL) 25 MG tablet Take 1 tablet (25 mg total) by mouth daily. 90 tablet 3  . LANCETS ULTRA FINE MISC Use BID to check blood sugar 100 each 3  . liraglutide (VICTOZA) 18 MG/3ML SOPN Inject 1.8 mg [place] daily 9 pen 3  . Melatonin 3 MG TABS Take 1 tablet by mouth 2 (two) times daily.     . metFORMIN (GLUCOPHAGE) 1000 MG tablet Take 1 tablet (1,000 mg total) by mouth 2 (two) times daily with a meal. 180 tablet 3  . Multiple Vitamin (MULTIVITAMIN) tablet Take 1 tablet by mouth daily.    . niacin (NIASPAN) 500 MG CR tablet Take 1 tablet (500 mg total) by mouth at bedtime. 30 tablet 6  . omeprazole (PRILOSEC) 40 MG capsule Take 1 capsule (40 mg total) by mouth daily. 90 capsule 3  . tadalafil (CIALIS) 5 MG tablet Take 1 tablet (5 mg total) daily as needed by mouth for erectile dysfunction (and BPH). (Patient taking differently: Take 5 mg by mouth daily. ) 90 tablet 3  . Turmeric 450 MG CAPS Take 1 tablet by mouth daily.     . vitamin C (ASCORBIC ACID) 500 MG tablet Take 500 mg by mouth daily.     No current facility-administered medications on file prior to visit.     Review of Systems:  As per HPI- otherwise negative.   Physical Examination: Vitals:   03/31/18 1308  BP: 136/86  Pulse: 78  Resp: 16  SpO2: 98%   Vitals:   03/31/18 1308  Weight: 246 lb (111.6 kg)  Height: 5\' 9"  (1.753 m)   Body mass index is 36.33 kg/m. Ideal Body Weight: Weight in (lb) to have BMI = 25: 168.9  GEN: WDWN, NAD, Non-toxic, A & O x 3, obese, looks well  HEENT: Atraumatic, Normocephalic. Neck supple. No masses, No LAD.  Bilateral TM wnl, oropharynx normal.  PEERL,EOMI.   Ears and Nose: No external deformity. CV: RRR, No M/G/R. No JVD. No thrill. No extra  heart sounds. PULM: CTA B, no wheezes, crackles, rhonchi. No retractions. No resp. distress. No accessory muscle use. ABD: S, NT, ND, +BS. No rebound. No HSM.  Ventral hernia, stable Healed lap chole port sites  EXTR: No c/c/e NEURO Normal gait.  PSYCH: Normally interactive. Conversant. Not depressed or anxious appearing.  Calm demeanor.  Foot exam- missed one monofilament site  Hands show mild, diffuse arthritis changes at IP joints/.  No definite suggestion of RA   Assessment and Plan: Controlled type 2 diabetes mellitus with diabetic neuropathy, without long-term current use of insulin (Tim[facility]) - Plan: Comprehensive metabolic panel, Hemoglobin A1c, Microalbumin / creatinine urine ratio  Essential hypertension - Plan: CBC, Comprehensive metabolic panel, benazepril (LOTENSIN) 40 MG tablet  Anemia, unspecified type - Plan: CBC  Right hand pain - Plan: DG Hand 2 View Right, Sedimentation rate, C-reactive protein, Rheumatoid Factor, Cyclic citrul peptide antibody, IgG  S/P laparoscopic cholecystectomy - Plan: DG Abd 2 Views  Gastroesophageal reflux disease, esophagitis presence not specified - Plan: famotidine (PEPCID) 20 MG tablet  Following up today from lap chole done in April He is getting his health back following serious bout with acute cholangitis and sepsis this spring.  His BP is coming back up and he is eating more again.  Will refill 40 mg of benazepril Labs and x-rays to look for any evidence of RA He has relatively mild but persistent GI sx since his chole- suspect this may just be secondary to cholecystectomy but will look for any other issue on plain film today, labs    Signed Lamar Blinks, MD  Received his films, message to pt  Dg Hand 2 View Right  Result Date: 03/31/2018 CLINICAL DATA:  Initial evaluation for acute right hand pain for 2 months, no injury. EXAM: RIGHT HAND - 2 VIEW COMPARISON:  None. FINDINGS: No acute fracture or dislocation. Mild to moderate  scattered osteoarthritic changes seen throughout the DIP and PIP joints, most notable at the right first IP joint. Degenerative changes noted at the first MCP joint as well. Osseous mineralization normal. No acute soft tissue abnormality. IMPRESSION: 1. No acute osseous abnormality about the right hand. 2. 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 Electronically Signed   By: Jeannine Boga M.D.   On: 03/31/2018 14:18   Dg Abd 2 Views  Result Date: 03/31/2018 CLINICAL DATA:  Pt having right hand pain in his joints for 2 months with no injury,pt had gallbladder removed in 04/19 and is having bloating and gas. EXAM: ABDOMEN - 2 VIEW COMPARISON:  10/08/2017. FINDINGS: Normal bowel gas pattern. Mild generalized increased stool burden throughout the colon. Surgical vascular clips in right upper quadrant reflect recent cholecystectomy no evidence of renal or ureteral stones. Soft tissues are otherwise unremarkable. No acute skeletal abnormality IMPRESSION: 1. No acute findings.  No bowel obstruction. 2. Mild generalized increased stool in the colon.  Electronically Signed   By: Lajean Manes M.D.   On: 03/31/2018 16:02    Abdomen looks fine except for some increased stool Hand films show arthritis but do not show any definite sign of RA, await labs for any other clues.  I think we should have you see a GI doctor to help [place] figure out how best to treat your current abdominal symptoms.  I don't think you have any dangerous complications of your cholecystectomy, but you may have IBS or another more chronic issue that has been brought out by removing your gallbladder.  I will make this referral for you, and will be in touch with your other labs asap   Received his labs 7/16:  Results for orders placed or performed in visit on 03/31/18  CBC  Result Value Ref Range   WBC 7.4 4.0 - 10.5 K/uL   RBC 4.18 (L) 4.22 - 5.81  Mil/uL   Platelets 294.0 150.0 - 400.0 K/uL   Hemoglobin 12.3 (L) 13.0 - 17.0 g/dL   HCT 36.2 (L) 39.0 - 52.0 %   MCV 86.6 78.0 - 100.0 fl   MCHC 34.1 30.0 - 36.0 g/dL   RDW 14.3 11.5 - 15.5 %  Comprehensive metabolic panel  Result Value Ref Range   Sodium 136 135 - 145 mEq/L   Potassium 4.5 3.5 - 5.1 mEq/L   Chloride 101 96 - 112 mEq/L   CO2 22 19 - 32 mEq/L   Glucose, Bld 200 (H) 70 - 99 mg/dL   BUN 29 (H) 6 - 23 mg/dL   Creatinine, Ser 1.32 0.40 - 1.50 mg/dL   Total Bilirubin 0.3 0.2 - 1.2 mg/dL   Alkaline Phosphatase 39 39 - 117 U/L   AST 26 0 - 37 U/L   ALT 34 0 - 53 U/L   Total Protein 7.6 6.0 - 8.3 g/dL   Albumin 4.6 3.5 - 5.2 g/dL   Calcium 9.7 8.4 - 10.5 mg/dL   GFR 57.05 (L) >60.00 mL/min  Hemoglobin A1c  Result Value Ref Range   Hgb A1c MFr Bld 6.8 (H) 4.6 - 6.5 %  Microalbumin / creatinine urine ratio  Result Value Ref Range   Microalb, Ur 1.1 0.0 - 1.9 mg/dL   Creatinine,U 123.9 mg/dL   Microalb Creat Ratio 0.9 0.0 - 30.0 mg/g  Sedimentation rate  Result Value Ref Range   Sed Rate 26 (H) 0 - 20 mm/hr  C-reactive protein  Result Value Ref Range   CRP 0.1 (L) 0.5 - 20.0 mg/dL  Rheumatoid Factor  Result Value Ref Range   Rhuematoid fact SerPl-aCnc <42 <59 IU/mL  Cyclic citrul peptide antibody, IgG  Result Value Ref Range   Cyclic Citrullin Peptide Ab <16 UNITS   Your red cell counts are steadily climbing back into normal range Metabolic profile/ kidney function overall good Your diabetes is under good control- I am happy with this A1c.  We can continue your current diabetes regimen No protein in your urine which is great  Your autoimmune labs are overall good- slight elevation of the sed rate but this is very non- specific.  Your other labs are quite normal.  At this time I would presume that you have osteoarthritis, or regular arthritis, in your hands and other joints.  With your other health concerns I would recommend tylenol as first line for your hand  pain- let me know if we need to move up or try something else to control pain.    Take  care and please see me in 4-6 months JC

## 2018-03-31 ENCOUNTER — Encounter: Payer: Self-pay | Admitting: Family Medicine

## 2018-03-31 ENCOUNTER — Ambulatory Visit (INDEPENDENT_AMBULATORY_CARE_PROVIDER_SITE_OTHER): Payer: Medicare HMO | Admitting: Family Medicine

## 2018-03-31 ENCOUNTER — Ambulatory Visit (HOSPITAL_BASED_OUTPATIENT_CLINIC_OR_DEPARTMENT_OTHER)
Admission: RE | Admit: 2018-03-31 | Discharge: 2018-03-31 | Disposition: A | Discharge status: Home / Self Care | Payer: Medicare HMO | Source: Ambulatory Visit | Attending: Family Medicine | Admitting: Family Medicine

## 2018-03-31 VITALS — BP 136/86 | HR 78 | Resp 16 | Ht 69.0 in | Wt 246.0 lb

## 2018-03-31 DIAGNOSIS — I10 Essential (primary) hypertension: Secondary | ICD-10-CM

## 2018-03-31 DIAGNOSIS — M79641 Pain in right hand: Secondary | ICD-10-CM

## 2018-03-31 DIAGNOSIS — K219 Gastro-esophageal reflux disease without esophagitis: Secondary | ICD-10-CM | POA: Diagnosis not present

## 2018-03-31 DIAGNOSIS — D649 Anemia, unspecified: Secondary | ICD-10-CM | POA: Diagnosis not present

## 2018-03-31 DIAGNOSIS — M19041 Primary osteoarthritis, right hand: Secondary | ICD-10-CM | POA: Diagnosis not present

## 2018-03-31 DIAGNOSIS — K30 Functional dyspepsia: Secondary | ICD-10-CM | POA: Diagnosis not present

## 2018-03-31 DIAGNOSIS — E1140 Type 2 diabetes mellitus with diabetic neuropathy, unspecified: Secondary | ICD-10-CM | POA: Diagnosis not present

## 2018-03-31 DIAGNOSIS — R109 Unspecified abdominal pain: Secondary | ICD-10-CM | POA: Diagnosis not present

## 2018-03-31 DIAGNOSIS — Z9049 Acquired absence of other specified parts of digestive tract: Secondary | ICD-10-CM | POA: Diagnosis not present

## 2018-03-31 LAB — CBC
HEMATOCRIT: 36.2 % — AB (ref 39.0–52.0)
Hemoglobin: 12.3 g/dL — ABNORMAL LOW (ref 13.0–17.0)
MCHC: 34.1 g/dL (ref 30.0–36.0)
MCV: 86.6 fl (ref 78.0–100.0)
Platelets: 294 10*3/uL (ref 150.0–400.0)
RBC: 4.18 Mil/uL — ABNORMAL LOW (ref 4.22–5.81)
RDW: 14.3 % (ref 11.5–15.5)
WBC: 7.4 10*3/uL (ref 4.0–10.5)

## 2018-03-31 LAB — C-REACTIVE PROTEIN: CRP: 0.1 mg/dL — AB (ref 0.5–20.0)

## 2018-03-31 LAB — COMPREHENSIVE METABOLIC PANEL
ALT: 34 U/L (ref 0–53)
AST: 26 U/L (ref 0–37)
Albumin: 4.6 g/dL (ref 3.5–5.2)
Alkaline Phosphatase: 39 U/L (ref 39–117)
BUN: 29 mg/dL — AB (ref 6–23)
CALCIUM: 9.7 mg/dL (ref 8.4–10.5)
CHLORIDE: 101 meq/L (ref 96–112)
CO2: 22 mEq/L (ref 19–32)
Creatinine, Ser: 1.32 mg/dL (ref 0.40–1.50)
GFR: 57.05 mL/min — ABNORMAL LOW (ref >60.00)
Glucose, Bld: 200 mg/dL — ABNORMAL HIGH (ref 70–99)
POTASSIUM: 4.5 meq/L (ref 3.5–5.1)
SODIUM: 136 meq/L (ref 135–145)
Total Bilirubin: 0.3 mg/dL (ref 0.2–1.2)
Total Protein: 7.6 g/dL (ref 6.0–8.3)

## 2018-03-31 LAB — SEDIMENTATION RATE: SED RATE: 26 mm/h — AB (ref 0–20)

## 2018-03-31 LAB — HEMOGLOBIN A1C: Hgb A1c MFr Bld: 6.8 % — ABNORMAL HIGH (ref 4.6–6.5)

## 2018-03-31 MED ORDER — FAMOTIDINE 20 MG PO TABS: 20.0000 mg | ORAL_TABLET | Freq: Two times a day (BID) | ORAL | 3 refills | Status: DC

## 2018-03-31 MED ORDER — BENAZEPRIL HCL 40 MG PO TABS: 40.0000 mg | ORAL_TABLET | Freq: Every day | ORAL | 3 refills | Status: DC

## 2018-03-31 NOTE — Patient Instructions (Signed)
Great to see you today- I am so glad that you are feeling better!   Let's do labs and an x-ray of your belly and right hand today.  Will eval for any sign of rheumatoid arthritis and make a plan for your digestive concerns Your BP looks good Await A1c to determine how your diabetes is doing

## 2018-04-01 ENCOUNTER — Encounter: Payer: Self-pay | Admitting: Family Medicine

## 2018-04-01 LAB — CYCLIC CITRUL PEPTIDE ANTIBODY, IGG: Cyclic Citrullin Peptide Ab: <16 UNITS

## 2018-04-01 LAB — RHEUMATOID FACTOR: Rhuematoid fact SerPl-aCnc: <14 IU/mL (ref <14)

## 2018-04-01 LAB — MICROALBUMIN / CREATININE URINE RATIO
CREATININE, U: 123.9 mg/dL
MICROALB/CREAT RATIO: 0.9 mg/g (ref 0.0–30.0)
Microalb, Ur: 1.1 mg/dL (ref 0.0–1.9)

## 2018-04-07 ENCOUNTER — Telehealth: Payer: Self-pay | Admitting: *Deleted

## 2018-04-07 DIAGNOSIS — M47817 Spondylosis without myelopathy or radiculopathy, lumbosacral region: Secondary | ICD-10-CM | POA: Diagnosis not present

## 2018-04-07 NOTE — Telephone Encounter (Signed)
Received comprehensive chart note from [employer]; forwarded to provider/SLS 07/22

## 2018-04-16 DIAGNOSIS — M47817 Spondylosis without myelopathy or radiculopathy, lumbosacral region: Secondary | ICD-10-CM | POA: Diagnosis not present

## 2018-04-23 ENCOUNTER — Other Ambulatory Visit: Payer: Self-pay | Admitting: Family Medicine

## 2018-04-23 DIAGNOSIS — E1140 Type 2 diabetes mellitus with diabetic neuropathy, unspecified: Secondary | ICD-10-CM

## 2018-05-06 DIAGNOSIS — M47817 Spondylosis without myelopathy or radiculopathy, lumbosacral region: Secondary | ICD-10-CM | POA: Diagnosis not present

## 2018-05-09 DIAGNOSIS — R69 Illness, unspecified: Secondary | ICD-10-CM | POA: Diagnosis not present

## 2018-06-04 DIAGNOSIS — M47817 Spondylosis without myelopathy or radiculopathy, lumbosacral region: Secondary | ICD-10-CM | POA: Diagnosis not present

## 2018-06-11 ENCOUNTER — Encounter: Payer: Self-pay | Admitting: Gastroenterology

## 2018-06-11 ENCOUNTER — Ambulatory Visit: Payer: Medicare HMO | Admitting: Gastroenterology

## 2018-06-11 VITALS — BP 126/80 | HR 84 | Ht 69.0 in | Wt 252.5 lb

## 2018-06-11 DIAGNOSIS — K580 Irritable bowel syndrome with diarrhea: Secondary | ICD-10-CM | POA: Diagnosis not present

## 2018-06-11 DIAGNOSIS — Z86010 Personal history of colonic polyps: Secondary | ICD-10-CM | POA: Diagnosis not present

## 2018-06-11 DIAGNOSIS — R197 Diarrhea, unspecified: Secondary | ICD-10-CM | POA: Diagnosis not present

## 2018-06-11 DIAGNOSIS — Z8719 Personal history of other diseases of the digestive system: Secondary | ICD-10-CM | POA: Diagnosis not present

## 2018-06-11 DIAGNOSIS — R140 Abdominal distension (gaseous): Secondary | ICD-10-CM

## 2018-06-11 DIAGNOSIS — R1013 Epigastric pain: Secondary | ICD-10-CM

## 2018-06-11 MED ORDER — COLESTIPOL HCL 1 G PO TABS: 1.0000 g | ORAL_TABLET | Freq: Two times a day (BID) | ORAL | 2 refills | Status: DC

## 2018-06-11 NOTE — Progress Notes (Signed)
Timothy Chapman    [id number]    05/11/1946  Primary Care Physician:Copland, Gay Filler, MD  Referring Physician: [name], MD [address] [place], [place] [address]  Chief complaint: GERD  HPI: 72 year old male here for new patient visit.  Patient complains of dyspepsia with symptoms of indigestion, bloating and excessive belching.  Denies any dysphagia or odynophagia. No nausea or vomiting.  Tried PPI, was not very effective and is currently taking Pepcid  Diarrhea atleast couple times a day, better with fiber (psyllium) 1 teaspoon with flax seed powder daily  EGD and colonoscopy December 05, 2010 at [facility] Mild sigmoid colon diverticulosis otherwise normal exam.  Recommended recall colonoscopy in 5 years due to history of advanced colon adenoma. Short segment Barrett's esophagus biopsies were compatible with epithelial atypia and goblet cell metaplasia with inflamed gastroesophageal junction mucosa  EGD November 01, 2011 showed short segment Barrett's esophagus biopsy showed marked  chronic inflammation, negative for intestinal metaplasia, dysplasia or malignancy  Outpatient Encounter Medications as of 06/11/2018  Medication Sig  . Acetylcarnitine HCl 500 MG CAPS Take 500 mg by mouth daily.  . Alcohol Swabs (B-D SINGLE USE SWABS REGULAR) PADS   . Alpha-Lipoic Acid 600 MG CAPS Take 600 mg by mouth daily.  Marland Kitchen amLODipine (NORVASC) 10 MG tablet Take 10 mg daily  . aspirin 81 MG chewable tablet Chew 1 tablet (81 mg total) by mouth daily.  . B-D UF III MINI PEN NEEDLES 31G X 5 MM MISC   . benazepril (LOTENSIN) 40 MG tablet Take 1 tablet (40 mg total) by mouth daily.  . diclofenac (VOLTAREN) 75 MG EC tablet TAKE 1 TABLET TWICE A DAY  AS NEEDED FOR JOINT PAINS  . famotidine (PEPCID) 20 MG tablet Take 1 tablet (20 mg total) by mouth 2 (two) times daily. (Patient taking differently: Take 20 mg by mouth daily. )  . glipiZIDE (GLUCOTROL) 10 MG  tablet TAKE 1 TABLET TWICE A DAY  BEFORE A MEAL  . glucose blood (ONETOUCH VERIO) test strip Use as instructed  . hydrochlorothiazide (HYDRODIURIL) 25 MG tablet Take 1 tablet (25 mg total) by mouth daily.  Marland Kitchen LANCETS ULTRA FINE MISC Use BID to check blood sugar  . liraglutide (VICTOZA) 18 MG/3ML SOPN Inject 1.8 mg [place] daily  . Melatonin 3 MG TABS Take 1 tablet by mouth 2 (two) times daily.   . metFORMIN (GLUCOPHAGE) 1000 MG tablet TAKE 1 TABLET TWICE A DAY  WITH A MEAL  . Multiple Vitamin (MULTIVITAMIN) tablet Take 1 tablet by mouth daily.  . niacin (NIASPAN) 500 MG CR tablet Take 1 tablet (500 mg total) by mouth at bedtime.  Marland Kitchen omeprazole (PRILOSEC) 40 MG capsule Take 1 capsule (40 mg total) by mouth daily.  . tadalafil (CIALIS) 5 MG tablet Take 1 tablet (5 mg total) daily as needed by mouth for erectile dysfunction (and BPH). (Patient taking differently: Take 5 mg by mouth daily. )  . Turmeric 450 MG CAPS Take 1 tablet by mouth daily.   . vitamin C (ASCORBIC ACID) 500 MG tablet Take 500 mg by mouth daily.   No facility-administered encounter medications on file as of 06/11/2018.     Allergies as of 06/11/2018 - Review Complete 06/11/2018  Allergen Reaction Noted  . Codeine  03/12/2009    Past Medical History:  Diagnosis Date  . AKI (acute kidney injury) ([facility])   . Anxiety   .  Arthritis   . Barrett esophagus   . Depression   . Diabetes mellitus without complication ([place])    type 2  . GERD (gastroesophageal reflux disease)   . Hypertension   . Neuromuscular disorder ([facility])    neuropathy but being treated  . PONV (postoperative nausea and vomiting)   . Sleep apnea    has a sleep study but refuses that he has sleep study, not wearing CPAP    Past Surgical History:  Procedure Laterality Date  . BACK SURGERY     X2 - lower  . CHOLECYSTECTOMY N/A 12/20/2017   Procedure: LAPAROSCOPIC CHOLECYSTECTOMY WITH INTRAOPERATIVE CHOLANGIOGRAM POSSIBLE OPEN;  Surgeon: Fanny Skates, MD;   Location: [facility];  Service: General;  Laterality: N/A;  . COLONOSCOPY    . ESOPHAGOGASTRODUODENOSCOPY    . IR RADIOLOGIST EVAL & MGMT  10/30/2017  . IR RADIOLOGIST EVAL & MGMT  11/14/2017  . REPLACEMENT TOTAL KNEE Left   . ROTATOR CUFF REPAIR      Family History  Problem Relation Age of Onset  . Heart failure Father   . Heart disease Father   . Emphysema Mother     Social History   Socioeconomic History  . Marital status: Married    Spouse name: Not on file  . Number of children: Not on file  . Years of education: Not on file  . Highest education level: Not on file  Occupational History  . Not on file  Social Needs  . Financial resource strain: Not on file  . Food insecurity:    Worry: Not on file    Inability: Not on file  . Transportation needs:    Medical: Not on file    Non-medical: Not on file  Tobacco Use  . Smoking status: Former Smoker    Last attempt to quit: 09/18/1975    Years since quitting: 42.7  . Smokeless tobacco: Never Used  Substance and Sexual Activity  . Alcohol use: Yes    Alcohol/week: 0.0 standard drinks    Comment: Rarely  . Drug use: Never  . Sexual activity: Not on file  Lifestyle  . Physical activity:    Days per week: Not on file    Minutes per session: Not on file  . Stress: Not on file  Relationships  . Social connections:    Talks on phone: Not on file    Gets together: Not on file    Attends religious service: Not on file    Active member of club or organization: Not on file    Attends meetings of clubs or organizations: Not on file    Relationship status: Not on file  . Intimate partner violence:    Fear of current or ex partner: Not on file    Emotionally abused: Not on file    Physically abused: Not on file    Forced sexual activity: Not on file  Other Topics Concern  . Not on file  Social History Narrative  . Not on file      Review of systems: Review of Systems  Constitutional: Negative for fever and chills.   Positive for lack of energy and unexplained weight gain HENT: Negative.   Eyes: Negative for blurred vision.  Respiratory: Negative for cough, shortness of breath and wheezing.   Cardiovascular: Negative for chest pain and palpitations.  Gastrointestinal: as per HPI Genitourinary: Negative for dysuria, urgency, frequency and hematuria.  Musculoskeletal: Negative for myalgias, back pain and positive for joint pain.  Skin:  Negative for itching and rash.  Neurological: Negative for dizziness, tremors, focal weakness, seizures and loss of consciousness.  Endo/Heme/Allergies: Negative for seasonal allergies.  Psychiatric/Behavioral: Negative for depression, suicidal ideas and hallucinations.  All other systems reviewed and are negative.   Physical Exam: Vitals:   06/11/18 1001  BP: 126/80  Pulse: 84   Body mass index is 37.29 kg/m. Gen:      No acute distress HEENT:  EOMI, sclera anicteric Neck:     No masses; no thyromegaly Lungs:    Clear to auscultation bilaterally; normal respiratory effort CV:         Regular rate and rhythm; no murmurs Abd:      + bowel sounds; soft, non-tender; no palpable masses, no distension Ext:    1+ edema; adequate peripheral perfusion Skin:      Warm and dry; no rash Neuro: alert and oriented x 3 Psych: normal mood and affect  Data Reviewed:  Reviewed labs, radiology imaging, old records and pertinent past GI work up   Assessment and Plan/Recommendations:  72 year old male with history of Barrett's esophagus and colon adenomatous polyps here with complaints of dyspepsia and excessive bloating with belching Scheduled for EGD and colonoscopy as patient is due for surveillance.  Patient was recommended repeat colonoscopy and EGD in 2017 Continue omeprazole daily and Pepcid at bedtime for 2 months Discussed antireflux measures  We will switch to soluble fiber, Benefiber 1 teaspoon 3 times daily as tolerated Advised patient to minimize intake of  flaxseed and insoluble fiber Trial of Colestid 1 g twice daily for possible bile salt induced diarrhea  The risks and benefits as well as alternatives of endoscopic procedure(s) have been discussed and reviewed. All questions answered. The patient agrees to proceed.  Damaris Hippo , MD [phone]    CC: Copland, Gay Filler, MD

## 2018-06-11 NOTE — Patient Instructions (Addendum)
It has been recommended to you by your physician that you have a(n) colonoscopy/endoscopy with 2 day prep completed. Per your request, we did not schedule the procedure(s) today. Please contact our office at [phone] should you decide to have the procedure completed. You will be scheduled for a pre-op appointment with a nurse at that time  Start benefiber 1 teaspoon three times daily  STOP psyllium and flax seed  Start colestid 1 g twice daily, we will send the prescription to your pharmacy  Follow up in 3 months   If you are age 20 or older, your body mass index should be between 23-30. Your Body mass index is 37.29 kg/m. If this is out of the aforementioned range listed, please consider follow up with your Primary Care Provider.  If you are age 32 or younger, your body mass index should be between 19-25. Your Body mass index is 37.29 kg/m. If this is out of the aformentioned range listed, please consider follow up with your Primary Care Provider.    Thank you for choosing [facility]  Karleen Hampshire Nandigam,MD

## 2018-06-16 ENCOUNTER — Encounter: Payer: Self-pay | Admitting: Gastroenterology

## 2018-06-23 ENCOUNTER — Ambulatory Visit (AMBULATORY_SURGERY_CENTER): Payer: Self-pay

## 2018-06-23 ENCOUNTER — Encounter: Payer: Self-pay | Admitting: Gastroenterology

## 2018-06-23 VITALS — Ht 69.0 in | Wt 252.2 lb

## 2018-06-23 DIAGNOSIS — Z86010 Personal history of colonic polyps: Secondary | ICD-10-CM

## 2018-06-23 DIAGNOSIS — K2270 Barrett's esophagus without dysplasia: Secondary | ICD-10-CM

## 2018-06-23 MED ORDER — MOVIPREP 100 G PO SOLR: 1.0000 | Freq: Once | ORAL | 0 refills | Status: AC

## 2018-06-23 NOTE — Progress Notes (Signed)
Per pt, no allergies to soy or egg products.Pt not taking any weight loss meds or using  O2 at home.  Pt refused emmi video. 

## 2018-06-25 ENCOUNTER — Encounter: Payer: Self-pay | Admitting: Family Medicine

## 2018-06-25 DIAGNOSIS — N529 Male erectile dysfunction, unspecified: Secondary | ICD-10-CM

## 2018-06-25 DIAGNOSIS — N401 Enlarged prostate with lower urinary tract symptoms: Secondary | ICD-10-CM

## 2018-06-26 ENCOUNTER — Encounter: Payer: Self-pay | Admitting: Family Medicine

## 2018-06-26 DIAGNOSIS — N401 Enlarged prostate with lower urinary tract symptoms: Secondary | ICD-10-CM

## 2018-06-26 DIAGNOSIS — N529 Male erectile dysfunction, unspecified: Secondary | ICD-10-CM

## 2018-06-30 MED ORDER — TADALAFIL 5 MG PO TABS: 5.0000 mg | ORAL_TABLET | Freq: Every day | ORAL | 3 refills | Status: DC

## 2018-07-02 MED ORDER — TADALAFIL 5 MG PO TABS: 5.0000 mg | ORAL_TABLET | Freq: Every day | ORAL | 3 refills | Status: DC

## 2018-07-07 ENCOUNTER — Ambulatory Visit (AMBULATORY_SURGERY_CENTER): Payer: Medicare HMO | Admitting: Gastroenterology

## 2018-07-07 ENCOUNTER — Encounter: Payer: Self-pay | Admitting: Gastroenterology

## 2018-07-07 VITALS — BP 109/50 | HR 64 | Temp 98.0°F | Resp 16 | Ht 69.0 in | Wt 252.0 lb

## 2018-07-07 DIAGNOSIS — E119 Type 2 diabetes mellitus without complications: Secondary | ICD-10-CM | POA: Diagnosis not present

## 2018-07-07 DIAGNOSIS — I272 Pulmonary hypertension, unspecified: Secondary | ICD-10-CM | POA: Diagnosis not present

## 2018-07-07 DIAGNOSIS — Z886 Allergy status to analgesic agent status: Secondary | ICD-10-CM | POA: Diagnosis not present

## 2018-07-07 DIAGNOSIS — K2270 Barrett's esophagus without dysplasia: Secondary | ICD-10-CM

## 2018-07-07 DIAGNOSIS — K219 Gastro-esophageal reflux disease without esophagitis: Secondary | ICD-10-CM | POA: Diagnosis not present

## 2018-07-07 DIAGNOSIS — R197 Diarrhea, unspecified: Secondary | ICD-10-CM | POA: Diagnosis not present

## 2018-07-07 DIAGNOSIS — Z86010 Personal history of colonic polyps: Secondary | ICD-10-CM

## 2018-07-07 MED ORDER — SODIUM CHLORIDE 0.9 % IV SOLN: 500.0000 mL | Freq: Once | INTRAVENOUS | Status: DC

## 2018-07-07 NOTE — Op Note (Signed)
[facility] Patient Name: Timothy Chapman Procedure Date: 07/07/2018 2:23 PM MRN: [id number] Endoscopist: Mauri Pole , MD Age: 72 Referring MD:  Date of Birth: 24-Aug-1946 Gender: Male Account #: [id number] Procedure:                Upper GI endoscopy Indications:              Surveillance for malignancy due to personal history                            of Barrett's esophagus Medicines:                Monitored Anesthesia Care Procedure:                Pre-Anesthesia Assessment:                           - Prior to the procedure, a History and Physical                            was performed, and patient medications and                            allergies were reviewed. The patient's tolerance of                            previous anesthesia was also reviewed. The risks                            and benefits of the procedure and the sedation                            options and risks were discussed with the patient.                            All questions were answered, and informed consent                            was obtained. Prior Anticoagulants: The patient has                            taken no previous anticoagulant or antiplatelet                            agents. ASA Grade Assessment: II - A patient with                            mild systemic disease. After reviewing the risks                            and benefits, the patient was deemed in                            satisfactory condition to undergo the procedure.  After obtaining informed consent, the endoscope was                            passed under direct vision. Throughout the                            procedure, the patient's blood pressure, pulse, and                            oxygen saturations were monitored continuously. The                            Endoscope was introduced through the mouth, and                            advanced to the second part  of duodenum. The upper                            GI endoscopy was accomplished without difficulty.                            The patient tolerated the procedure well. Scope In: Scope Out: Findings:                 The Z-line was regular and was found 40 cm from the                            incisors.                           No gross lesions were noted in the entire esophagus.                           The stomach was normal.                           The examined duodenum was normal. Complications:            No immediate complications. Estimated Blood Loss:     Estimated blood loss: none. Impression:               - Z-line regular, 40 cm from the incisors.                           - No gross lesions in esophagus.                           - Normal stomach.                           - Normal examined duodenum.                           - No specimens collected. Recommendation:           - Patient has a contact number available for  emergencies. The signs and symptoms of potential                            delayed complications were discussed with the                            patient. Return to normal activities tomorrow.                            Written discharge instructions were provided to the                            patient.                           - Resume previous diet.                           - Continue present medications.                           - No repeat upper endoscopy. Mauri Pole, MD 07/07/2018 3:03:36 PM This report has been signed electronically.

## 2018-07-07 NOTE — Progress Notes (Signed)
Pt's states no medical or surgical changes since previsit or office visit. 

## 2018-07-07 NOTE — Patient Instructions (Signed)
YOU HAD AN ENDOSCOPIC PROCEDURE TODAY AT THE [facility]:   Refer to the procedure report that was given to you for any specific questions about what was found during the examination.  If the procedure report does not answer your questions, please call your gastroenterologist to clarify.  If you requested that your care partner not be given the details of your procedure findings, then the procedure report has been included in a sealed envelope for you to review at your convenience later.  YOU SHOULD EXPECT: Some feelings of bloating in the abdomen. Passage of more gas than usual.  Walking can help get rid of the air that was put into your GI tract during the procedure and reduce the bloating. If you had a lower endoscopy (such as a colonoscopy or flexible sigmoidoscopy) you may notice spotting of blood in your stool or on the toilet paper. If you underwent a bowel prep for your procedure, you may not have a normal bowel movement for a few days.  Please Note:  You might notice some irritation and congestion in your nose or some drainage.  This is from the oxygen used during your procedure.  There is no need for concern and it should clear up in a day or so.  SYMPTOMS TO REPORT IMMEDIATELY:   Following lower endoscopy (colonoscopy or flexible sigmoidoscopy):  Excessive amounts of blood in the stool  Significant tenderness or worsening of abdominal pains  Swelling of the abdomen that is new, acute  Fever of 100F or higher   Following upper endoscopy (EGD)  Vomiting of blood or coffee ground material  New chest pain or pain under the shoulder blades  Painful or persistently difficult swallowing  New shortness of breath  Fever of 100F or higher  Black, tarry-looking stools  For urgent or emergent issues, a gastroenterologist can be reached at any hour by calling [phone].   DIET:  We do recommend a small meal at first, but then you may proceed to your regular diet.  Drink  plenty of fluids but you should avoid alcoholic beverages for 24 hours.  MEDICATIONS: Continue present medications.  Please see handouts given to you by your recovery nurse.  ACTIVITY:  You should plan to take it easy for the rest of today and you should NOT DRIVE or use heavy machinery until tomorrow (because of the sedation medicines used during the test).    FOLLOW UP: Our staff will call the number listed on your records the next business day following your procedure to check on you and address any questions or concerns that you may have regarding the information given to you following your procedure. If we do not reach you, we will leave a message.  However, if you are feeling well and you are not experiencing any problems, there is no need to return our call.  We will assume that you have returned to your regular daily activities without incident.  If any biopsies were taken you will be contacted by phone or by letter within the next 1-3 weeks.  Please call us at [phone] if you have not heard about the biopsies in 3 weeks.   Thank you for allowing us to provide for your healthcare needs today.   SIGNATURES/CONFIDENTIALITY: You and/or your care partner have signed paperwork which will be entered into your electronic medical record.  These signatures attest to the fact that that the information above on your After Visit Summary has been reviewed and is   understood.  Full responsibility of the confidentiality of this discharge information lies with you and/or your care-partner. 

## 2018-07-07 NOTE — Progress Notes (Signed)
Report to PACU, RN, vss, BBS= Clear.  

## 2018-07-07 NOTE — Op Note (Signed)
[facility] Patient Name: Timothy Chapman Procedure Date: 07/07/2018 2:23 PM MRN: [id number] Endoscopist: Mauri Pole , MD Age: 72 Referring MD:  Date of Birth: Nov 28, 1945 Gender: Male Account #: [id number] Procedure:                Colonoscopy Indications:              High risk colon cancer surveillance: Personal                            history of colonic polyps Medicines:                Monitored Anesthesia Care Procedure:                Pre-Anesthesia Assessment:                           - Prior to the procedure, a History and Physical                            was performed, and patient medications and                            allergies were reviewed. The patient's tolerance of                            previous anesthesia was also reviewed. The risks                            and benefits of the procedure and the sedation                            options and risks were discussed with the patient.                            All questions were answered, and informed consent                            was obtained. Prior Anticoagulants: The patient has                            taken no previous anticoagulant or antiplatelet                            agents. ASA Grade Assessment: II - A patient with                            mild systemic disease. After reviewing the risks                            and benefits, the patient was deemed in                            satisfactory condition to undergo the procedure.  After obtaining informed consent, the colonoscope                            was passed under direct vision. Throughout the                            procedure, the patient's blood pressure, pulse, and                            oxygen saturations were monitored continuously. The                            Colonoscope was introduced through the anus and                            advanced to the the cecum,  identified by                            appendiceal orifice and ileocecal valve. The                            colonoscopy was performed without difficulty. The                            patient tolerated the procedure well. The quality                            of the bowel preparation was excellent. The                            ileocecal valve, appendiceal orifice, and rectum                            were photographed. Scope In: 2:42:31 PM Scope Out: 3:00:44 PM Scope Withdrawal Time: 0 hours 14 minutes 6 seconds  Total Procedure Duration: 0 hours 18 minutes 13 seconds  Findings:                 The perianal and digital rectal examinations were                            normal.                           Scattered small and large-mouthed diverticula were                            found in the sigmoid colon and descending colon.                           Non-bleeding internal hemorrhoids were found during                            retroflexion. The hemorrhoids were large.  The exam was otherwise without abnormality. Complications:            No immediate complications. Estimated Blood Loss:     Estimated blood loss: none. Impression:               - Diverticulosis in the sigmoid colon and in the                            descending colon.                           - Non-bleeding internal hemorrhoids.                           - The examination was otherwise normal.                           - No specimens collected. Recommendation:           - Patient has a contact number available for                            emergencies. The signs and symptoms of potential                            delayed complications were discussed with the                            patient. Return to normal activities tomorrow.                            Written discharge instructions were provided to the                            patient.                           -  Resume previous diet.                           - Continue present medications.                           - Repeat colonoscopy in 5 years for surveillance                            due to personal history of advanced adenomatous                            polyps. Mauri Pole, MD 07/07/2018 3:05:44 PM This report has been signed electronically.

## 2018-07-08 ENCOUNTER — Telehealth: Payer: Self-pay

## 2018-07-08 NOTE — Telephone Encounter (Signed)
  Follow up Call-  Call back number 07/07/2018  Post procedure Call Back phone  # [phone]  Permission to leave phone message Yes  Some recent data might be hidden     Patient questions:  Do you have a fever, pain , or abdominal swelling? No. Pain Score  0 *  Have you tolerated food without any problems? Yes.    Have you been able to return to your normal activities? Yes.    Do you have any questions about your discharge instructions: Diet   No. Medications  No. Follow up visit  No.  Do you have questions or concerns about your Care? No.  Actions: * If pain score is 4 or above: No action needed, pain <4.

## 2018-07-17 ENCOUNTER — Other Ambulatory Visit: Payer: Self-pay | Admitting: Family Medicine

## 2018-07-17 DIAGNOSIS — I10 Essential (primary) hypertension: Secondary | ICD-10-CM

## 2018-07-18 ENCOUNTER — Telehealth: Payer: Self-pay | Admitting: Family Medicine

## 2018-07-18 ENCOUNTER — Encounter: Payer: Self-pay | Admitting: Family Medicine

## 2018-07-18 NOTE — Telephone Encounter (Signed)
Does patient need to be taking half tablet (12.5 mg) or full (25mg ) tablet of hctz?

## 2018-07-18 NOTE — Telephone Encounter (Signed)
Copied from [place] [id number]. Topic: Quick Communication - See Telephone Encounter >> Jul 18, 2018  4:13 PM Bea Graff, NT wrote: CRM for notification. See Telephone encounter for: 07/18/18. [facility] states they have 2 different rxs for hydrochlorothiazide on this pts chart and want to verify which one the pt should be taking. Please advise. CB#: [phone] Ref#: [id number]

## 2018-07-21 NOTE — Telephone Encounter (Signed)
He is taking 25 mg of hctz. [employer] and let them know

## 2018-08-07 DIAGNOSIS — R69 Illness, unspecified: Secondary | ICD-10-CM | POA: Diagnosis not present

## 2018-08-12 LAB — BASIC METABOLIC PANEL
POTASSIUM: 4.5 (ref 3.4–5.3)
SODIUM: 137 (ref 137–147)

## 2018-08-12 LAB — HEMOGLOBIN A1C: HEMOGLOBIN A1C: 8.2

## 2018-08-24 NOTE — Progress Notes (Signed)
[place] at [facility] [address], [place], [place] [address] [phone]  Date:  08/25/2018   Name:  Timothy Chapman Timothy Chapman   DOB:  [birth year]   MRN:  [id number]  PCP:  [name], MD    Chief Complaint: Annual Exam (glucose levels, high readings. lab work from [place]) and Discuss Medication (colestipol, muscle cramps, add to allergy list? )   History of Present Illness:  Timothy Chapman is a 72 y.o. very pleasant male patient who presents with the following:  Following up today History of DM, one episode of a fib, HTN In January he had a very serious illness and was inpt for about 10 days with cholangitis: Patient is a 72 year old male with past medical history of hypertension, diabetes who presented with complaints of abdominal pain and fever. Found to have elevated LFTs. He was diagnosed with cholangitis. He also sustained  acute respiratory failure with hypoxia. He had to be admitted to ICU after intubation. He is currently status post cholecystostomy on 10/04/17. Surgery was following.IR was following.  Patient's overall condition is stabilized and patient was transferred to hospital service. Patient has been discharged home with home health today.  He got better and had a lap chole in April of this year  He had some a fib while he was so sick in the hospital  Saw cardiology in may and they planned an event monitor to see if he was continuing to have a fib: 1. Paroxysmal atrial fibrillation: Plan as outlined above it would be extremely essential to know if he still gets episode of atrial fibrillation if that is the case anticoagulation will be required. 2. Type 2 diabetes: Apparently his sugar is elevated however he works hard with his primary care physician to get it better. 3. Essential hypertension: plan as outlined above ?? Did he ever do the event monitor- pt reports that he did do this and it was ok - see report below    Notes recorded by Park Liter, MD on 03/21/2018 at 2:54 PM EDT Monitor showed few runs of SVT - short duration  Medical therapy, no atrial fibrillation   Just had a colonoscopy in October, looked ok He is seeing optho and having avastin shots   BP Readings from Last 3 Encounters:  08/25/18 (!) 148/90  07/07/18 (!) 109/50  06/11/18 126/80   Lab Results  Component Value Date   HGBA1C 6.8 (H) 03/31/2018   Flu shot:   Will do today  [facility] ?dose of pneumovax- he thinks that he had this at the [place]   He brings in some labs from the [place], done last month His A1c and tris have gone up a lot  A1c is 8.2%. Total cholesterol is 178, but his triglycerides were 673. This is a significant worsening from previous labs.  Diabetes meds:  Metformin 1000 BID victoza- GLp1 glipiizde 10 BID  For lipids he was taking niacin but ran out a couple of months ago  He has tried taking statins, but has not tolerated even a low dose.  Amlodipine [place] 81 pepcid hctz 25  He notes more diarrhea since his chole - mostly after eating.  He is taking imodium which does help  He cannot pinpoint certain foods as the culprit   He [place] his [place] coming [place] for a few weeks- they are looking forward to this  Notes that home  BP readings are generally 140/90 or less   Patient Active Problem List   Diagnosis Date Noted  . Paroxysmal atrial fibrillation ([place]) 12/16/2017  . SBO (small bowel obstruction) ([place]) 10/08/2017  . ATN (acute tubular necrosis) ([place]) 10/08/2017  . Septic shock ([place]) 10/04/2017  . Acute cholecystitis s/p perc cholecystomy drainage 10/04/2017   . Acute respiratory failure with hypoxia ([place])   . Chronic back pain 02/08/2016  . Controlled type 2 diabetes mellitus with diabetic neuropathy, without long-term current use of insulin ([place]) 02/08/2016  . Insomnia 02/08/2016  . History of diverticulitis 02/08/2016  . HYPERTENSION, BENIGN ESSENTIAL,  UNCONTROLLED 03/12/2009  . ABNORMAL ELECTROCARDIOGRAM 03/12/2009    Past Medical History:  Diagnosis Date  . AKI (acute kidney injury) ([place])   . Anxiety   . Arthritis   . Barrett esophagus   . Depression   . Diabetes mellitus without complication ([place])    type 2  . GERD (gastroesophageal reflux disease)   . Hypertension   . Neuromuscular disorder (HCC)    neuropathy but being treated  . PONV (postoperative nausea and vomiting)   . Sleep apnea    has a sleep study but refuses that he has sleep study, not wearing CPAP    Past Surgical History:  Procedure Laterality Date  . BACK SURGERY     X2 - lower  . CHOLECYSTECTOMY N/A 12/20/2017   Procedure: LAPAROSCOPIC CHOLECYSTECTOMY WITH INTRAOPERATIVE CHOLANGIOGRAM POSSIBLE OPEN;  Surgeon: Fanny Skates, MD;  Location: [place];  Service: General;  Laterality: N/A;  . COLONOSCOPY    . ESOPHAGOGASTRODUODENOSCOPY    . IR RADIOLOGIST EVAL & MGMT  10/30/2017  . IR RADIOLOGIST EVAL & MGMT  11/14/2017  . PROSTATE SURGERY  2017   uro lift per pt.  . REPLACEMENT TOTAL KNEE Left   . ROTATOR CUFF REPAIR     Bil    Social History   Tobacco Use  . Smoking status: Former Smoker    Last attempt to quit: 09/18/1975    Years since quitting: 42.9  . Smokeless tobacco: Never Used  Substance Use Topics  . Alcohol use: Yes    Alcohol/week: 0.0 standard drinks    Comment: Rarely  . Drug use: Never    Family History  Problem Relation Age of Onset  . Heart failure Father   . Heart disease Father   . Emphysema Mother   . Colon cancer Maternal Grandmother     Allergies  Allergen Reactions  . Codeine     hallucinations    Medication list has been reviewed and updated.  Current Outpatient Medications on File Prior to Visit  Medication Sig Dispense Refill  . Acetylcarnitine HCl 500 MG CAPS Take 500 mg by mouth daily.    . Alcohol Swabs (B-D SINGLE USE SWABS REGULAR) PADS     . Alpha-Lipoic Acid 600 MG CAPS Take 600 mg by mouth daily.     Marland Kitchen amLODipine (NORVASC) 10 MG tablet Take 10 mg daily 90 tablet 3  . aspirin 81 MG chewable tablet Chew 1 tablet (81 mg total) by mouth daily. 30 tablet 0  . B-D UF III MINI PEN NEEDLES 31G X 5 MM MISC     . benazepril (LOTENSIN) 40 MG tablet Take 1 tablet (40 mg total) by mouth daily. 90 tablet 3  . diclofenac (VOLTAREN) 75 MG EC tablet TAKE 1 TABLET TWICE A DAY  AS NEEDED FOR JOINT PAINS 180 tablet 1  . famotidine (PEPCID) 20 MG tablet Take 1 tablet (  20 mg total) by mouth 2 (two) times daily. 180 tablet 3  . glipiZIDE (GLUCOTROL) 10 MG tablet TAKE 1 TABLET TWICE A DAY  BEFORE A MEAL 180 tablet 1  . glucose blood (ONETOUCH VERIO) test strip Use as instructed 100 each 12  . hydrochlorothiazide (HYDRODIURIL) 25 MG tablet Take 1 tablet (25 mg total) by mouth daily. 90 tablet 3  . LANCETS ULTRA FINE MISC Use BID to check blood sugar 100 each 3  . liraglutide (VICTOZA) 18 MG/3ML SOPN 1.2 ml    . Melatonin 3 MG TABS Take 1 tablet by mouth at bedtime.     . metFORMIN (GLUCOPHAGE) 1000 MG tablet TAKE 1 TABLET TWICE A DAY  WITH A MEAL 180 tablet 1  . Multiple Vitamin (MULTIVITAMIN) tablet Take 1 tablet by mouth daily.    . niacin (NIASPAN) 500 MG CR tablet Take 1 tablet (500 mg total) by mouth at bedtime. 30 tablet 6  . omeprazole (PRILOSEC) 40 MG capsule Take 1 capsule (40 mg total) by mouth daily. 90 capsule 3  . tadalafil (CIALIS) 5 MG tablet Take 1 tablet (5 mg total) by mouth daily. 90 tablet 3  . Turmeric 450 MG CAPS Take 1 tablet by mouth daily.     . vitamin C (ASCORBIC ACID) 500 MG tablet Take 500 mg by mouth daily.    . colestipol (COLESTID) 1 g tablet Take 1 tablet (1 g total) by mouth 2 (two) times daily. (Patient not taking: Reported on 06/23/2018) 60 tablet 2   No current facility-administered medications on file prior to visit.     Review of Systems:  As per HPI- otherwise negative.   Physical Examination: Vitals:   08/25/18 1340  BP: (!) 148/90  Pulse: 72  Resp: 18  SpO2:  98%   Vitals:   08/25/18 1340  Weight: 262 lb (118.8 kg)  Height: 5\' 9"  (1.753 m)   Body mass index is 38.69 kg/m. Ideal Body Weight: Weight in (lb) to have BMI = 25: 168.9  GEN: WDWN, NAD, Non-toxic, A & O x 3.  Obese, looks well HEENT: Atraumatic, Normocephalic. Neck supple. No masses, No LAD. Ears and Nose: No external deformity. CV: RRR, No M/G/R. No JVD. No thrill. No extra heart sounds. PULM: CTA B, no wheezes, crackles, rhonchi. No retractions. No resp. distress. No accessory muscle use. ABD: S, NT, ND, +BS. No rebound. No HSM.  Belly is benign EXTR: No c/c/e NEURO Normal gait.  PSYCH: Normally interactive. Conversant. Not depressed or anxious appearing.  Calm demeanor.    Assessment and Plan:. Controlled type 2 diabetes mellitus with diabetic neuropathy, without long-term current use of insulin ([place]) - Plan: dapagliflozin propanediol (FARXIGA) 5 MG TABS tablet  Essential hypertension  Anemia, unspecified type  Acute renal insufficiency  Dyslipidemia  Hypertriglyceridemia - Plan: omega-3 acid ethyl esters (LOVAZA) 1 g capsule  Following up today.  He brings labs from the [place] which were abstracted to his chart. A1c has gone up.  We will add Farxiga 5 mg to his regimen.  Once he gets back from his trip, we may change his Victoza to [place]. Triglycerides are quite high, but he is not able to tolerate statins.  We will start him on low lovaza in hopes of bringing his triglycerides down.  Plan to visit in the office in 3 months and do A1c and lipids form at that time He will ask the [place] about a Pneumovax and Shingrix vaccine. Patient reports his blood pressures well controlled  at home.  Continue current medications for blood pressure.  Signed Lamar Blinks, MD

## 2018-08-25 ENCOUNTER — Ambulatory Visit (INDEPENDENT_AMBULATORY_CARE_PROVIDER_SITE_OTHER): Payer: Medicare HMO | Admitting: Family Medicine

## 2018-08-25 ENCOUNTER — Encounter: Payer: Self-pay | Admitting: Family Medicine

## 2018-08-25 VITALS — BP 148/90 | HR 72 | Resp 18 | Ht 69.0 in | Wt 262.0 lb

## 2018-08-25 DIAGNOSIS — N289 Disorder of kidney and ureter, unspecified: Secondary | ICD-10-CM

## 2018-08-25 DIAGNOSIS — D649 Anemia, unspecified: Secondary | ICD-10-CM | POA: Diagnosis not present

## 2018-08-25 DIAGNOSIS — E785 Hyperlipidemia, unspecified: Secondary | ICD-10-CM

## 2018-08-25 DIAGNOSIS — E781 Pure hyperglyceridemia: Secondary | ICD-10-CM | POA: Diagnosis not present

## 2018-08-25 DIAGNOSIS — I10 Essential (primary) hypertension: Secondary | ICD-10-CM | POA: Diagnosis not present

## 2018-08-25 DIAGNOSIS — E1140 Type 2 diabetes mellitus with diabetic neuropathy, unspecified: Secondary | ICD-10-CM | POA: Diagnosis not present

## 2018-08-25 MED ORDER — DAPAGLIFLOZIN PROPANEDIOL 5 MG PO TABS: 5.0000 mg | ORAL_TABLET | Freq: Every day | ORAL | 6 refills | Status: DC

## 2018-08-25 MED ORDER — OMEGA-3-ACID ETHYL ESTERS 1 G PO CAPS: 2.0000 g | ORAL_CAPSULE | Freq: Two times a day (BID) | ORAL | 6 refills | Status: DC

## 2018-08-25 NOTE — Patient Instructions (Signed)
Good to see you toyda- thanks for bringing in your labs Let's add Farxiga 5mg  for your diabetes Once you get back from your trip contact me- we might change you from Victoza to [place] which is once a week and may be more powerful   I am concerned about your triglycerides being so high- this may put you at risk of pancreatitis  Let's plan to visit in the office in about 3 months and repeat A1c and lipids   [facility]- do they have one on file for you?   You might also want to get the shinglex vaccine- shingrix

## 2018-09-05 ENCOUNTER — Encounter: Payer: Self-pay | Admitting: Family Medicine

## 2018-09-11 ENCOUNTER — Telehealth: Payer: Self-pay

## 2018-09-11 NOTE — Telephone Encounter (Signed)
Received fax from [facility] recommending pt. start statin therapy d/t DM. Routed to PCP for review.

## 2018-09-13 NOTE — Telephone Encounter (Signed)
Pt does not tolerate statins, have tried several

## 2018-10-02 ENCOUNTER — Other Ambulatory Visit: Payer: Self-pay | Admitting: Family Medicine

## 2018-10-02 DIAGNOSIS — K219 Gastro-esophageal reflux disease without esophagitis: Secondary | ICD-10-CM

## 2018-10-02 DIAGNOSIS — R69 Illness, unspecified: Secondary | ICD-10-CM | POA: Diagnosis not present

## 2018-10-02 DIAGNOSIS — E1140 Type 2 diabetes mellitus with diabetic neuropathy, unspecified: Secondary | ICD-10-CM

## 2018-10-03 DIAGNOSIS — R69 Illness, unspecified: Secondary | ICD-10-CM | POA: Diagnosis not present

## 2018-10-07 NOTE — Progress Notes (Signed)
[place] at [facility] [address] [place], [place] [address] [phone]  Date:  10/08/2018   Name:  Timothy Chapman [facility]   DOB:  [birth year]   MRN:  [id number]  PCP:  [name], MD    Chief Complaint: No chief complaint on file.   History of Present Illness:  Timothy Chapman is a 73 y.o. very pleasant male patient who presents with the following: [place] Patient with history of diabetes, hypertension, past suggestion of A. Fib. He had a very serious illness about a year ago, when he had cholangitis along with respiratory failure.  He was on the ICU and on a vent for period of time, had a lab [place] and eventually recovered  He did do an event monitor over the summer, did not have atrial fibrillation.  Therefore he is not on anticoagulation He is also a patient of the [place] In December he brought some labs from the [place], which showed A1c elevated 8.2%, and triglycerides of 673. He has not been able to tolerate any statin We added Farxiga 5mg  and Lovaza- he did not fill the lovaza as it was too expensive.  The farxiga seemed to cause a genital yeast infection so he also stopped taking this  Currently he is on glipizide 10 BID  victoza- he is on 1.2  Metformin 1000 BID  Due for a foot exam today Question flu shot - done in November ?  Eye exam; this was done last in the fall  His vision is stable   He got sick about a week ago with cough, sometimes productive Started out dry, now is productive No fever Some sinus pressure, mostly cough He did have a ST, now just irritated No earache He does have diarrhea since his chole- this is stable, he is using imodium as needed   Here today with his wife who contributes to the history  Patient Active Problem List   Diagnosis Date Noted  . Paroxysmal atrial fibrillation ([place]) 12/16/2017  . SBO (small bowel obstruction) ([place]) 10/08/2017   . ATN (acute tubular necrosis) ([place]) 10/08/2017  . Septic shock ([place]) 10/04/2017  . Acute cholecystitis s/p perc cholecystomy drainage 10/04/2017   . Acute respiratory failure with hypoxia ([place])   . Chronic back pain 02/08/2016  . Controlled type 2 diabetes mellitus with diabetic neuropathy, without long-term current use of insulin ([place]) 02/08/2016  . Insomnia 02/08/2016  . History of diverticulitis 02/08/2016  . HYPERTENSION, BENIGN ESSENTIAL, UNCONTROLLED 03/12/2009  . ABNORMAL ELECTROCARDIOGRAM 03/12/2009    Past Medical History:  Diagnosis Date  . AKI (acute kidney injury) ([place])   . Anxiety   . Arthritis   . Barrett esophagus   . Depression   . Diabetes mellitus without complication ([place])    type 2  . GERD (gastroesophageal reflux disease)   . Hypertension   . Neuromuscular disorder (HCC)    neuropathy but being treated  . PONV (postoperative nausea and vomiting)   . Sleep apnea    has a sleep study but refuses that he has sleep study, not wearing CPAP    Past Surgical History:  Procedure Laterality Date  . BACK SURGERY     X2 - lower  . CHOLECYSTECTOMY N/A 12/20/2017   Procedure: LAPAROSCOPIC CHOLECYSTECTOMY WITH INTRAOPERATIVE CHOLANGIOGRAM POSSIBLE OPEN;  Surgeon: Fanny Skates, MD;  Location: [place];  Service: General;  Laterality: N/A;  .  COLONOSCOPY    . ESOPHAGOGASTRODUODENOSCOPY    . IR RADIOLOGIST EVAL & MGMT  10/30/2017  . IR RADIOLOGIST EVAL & MGMT  11/14/2017  . PROSTATE SURGERY  2017   uro lift per pt.  . REPLACEMENT TOTAL KNEE Left   . ROTATOR CUFF REPAIR     Bil    Social History   Tobacco Use  . Smoking status: Former Smoker    Last attempt to quit: 09/18/1975    Years since quitting: 43.0  . Smokeless tobacco: Never Used  Substance Use Topics  . Alcohol use: Yes    Alcohol/week: 0.0 standard drinks    Comment: Rarely  . Drug use: Never    Family History  Problem Relation Age of Onset  . Heart failure Father   . Heart disease Father   .  Emphysema Mother   . Colon cancer Maternal Grandmother     Allergies  Allergen Reactions  . Codeine     hallucinations  . Statins     Has tried several cannot tolerate    Medication list has been reviewed and updated.  Current Outpatient Medications on File Prior to Visit  Medication Sig Dispense Refill  . Acetylcarnitine HCl 500 MG CAPS Take 500 mg by mouth daily.    . Alcohol Swabs (B-D SINGLE USE SWABS REGULAR) PADS USE PRIOR TO INSULIN DOSE 200 each 7  . Alpha-Lipoic Acid 600 MG CAPS Take 600 mg by mouth daily.    Marland Kitchen amLODipine (NORVASC) 10 MG tablet Take 10 mg daily 90 tablet 3  . aspirin 81 MG chewable tablet Chew 1 tablet (81 mg total) by mouth daily. 30 tablet 0  . B-D UF III MINI PEN NEEDLES 31G X 5 MM MISC     . benazepril (LOTENSIN) 40 MG tablet Take 1 tablet (40 mg total) by mouth daily. 90 tablet 3  . diclofenac (VOLTAREN) 75 MG EC tablet TAKE 1 TABLET TWICE A DAY  AS NEEDED FOR JOINT PAINS 180 tablet 1  . famotidine (PEPCID) 20 MG tablet Take 1 tablet (20 mg total) by mouth 2 (two) times daily. 180 tablet 3  . glipiZIDE (GLUCOTROL) 10 MG tablet TAKE 1 TABLET TWICE A DAY  BEFORE A MEAL 180 tablet 1  . glucose blood (ONETOUCH VERIO) test strip Use as instructed 100 each 12  . hydrochlorothiazide (HYDRODIURIL) 25 MG tablet Take 1 tablet (25 mg total) by mouth daily. 90 tablet 3  . Lancets (ONETOUCH DELICA PLUS [id number]) MISC USE TWO TIMES A DAY TO     CHECK BLOOD SUGAR 100 each 3  . liraglutide (VICTOZA) 18 MG/3ML SOPN 1.2 ml    . Melatonin 3 MG TABS Take 1 tablet by mouth at bedtime.     . metFORMIN (GLUCOPHAGE) 1000 MG tablet TAKE 1 TABLET TWICE A DAY  WITH A MEAL 180 tablet 1  . Multiple Vitamin (MULTIVITAMIN) tablet Take 1 tablet by mouth daily.    . niacin (NIASPAN) 500 MG CR tablet Take 1 tablet (500 mg total) by mouth at bedtime. 30 tablet 6  . omeprazole (PRILOSEC) 40 MG capsule TAKE 1 CAPSULE DAILY 90 capsule 3  . tadalafil (CIALIS) 5 MG tablet Take 1 tablet (5  mg total) by mouth daily. 90 tablet 3  . Turmeric 450 MG CAPS Take 1 tablet by mouth daily.     . vitamin C (ASCORBIC ACID) 500 MG tablet Take 500 mg by mouth daily.    . colestipol (COLESTID) 1 g tablet Take 1 tablet (  1 g total) by mouth 2 (two) times daily. (Patient not taking: Reported on 06/23/2018) 60 tablet 2   No current facility-administered medications on file prior to visit.     Review of Systems:  As per HPI- otherwise negative. No vomiting, no rash  Physical Examination: Vitals:   10/08/18 1025  BP: 138/70  Pulse: 87  Resp: 18  Temp: (!) 97.4 F (36.3 C)  SpO2: 97%   Vitals:   10/08/18 1025  Weight: 264 lb (119.7 kg)  Height: 5\' 9"  (1.753 m)   Body mass index is 38.99 kg/m. Ideal Body Weight: Weight in (lb) to have BMI = 25: 168.9  GEN: WDWN, NAD, Non-toxic, A & O x 3, looks well, obese HEENT: Atraumatic, Normocephalic. Neck supple. No masses, No LAD.  Bilateral TM wnl, oropharynx normal.  PEERL,EOMI.   Ears and Nose: No external deformity. CV: RRR, No M/G/R. No JVD. No thrill. No extra heart sounds. PULM: CTA B, no wheezes, crackles, rhonchi. No retractions. No resp. distress. No accessory muscle use. EXTR: No c/c/e NEURO Normal gait.  PSYCH: Normally interactive. Conversant. Not depressed or anxious appearing.  Calm demeanor.    Assessment and Plan: Acute bronchitis, unspecified organism - Plan: doxycycline (VIBRAMYCIN) 100 MG capsule  Controlled type 2 diabetes mellitus with diabetic neuropathy, without long-term current use of insulin ([place]) - Plan: Hemoglobin A1c  Here today with complaint of cough for about 1 week, which is becoming productive.  Will treat with doxycycline twice a day for 10 days. Foot exam performed today.  Advised patient that I cannot feel his pulses, he will let me know if he starts have any foot pain especially with exercise He was not able to add farxiga due to side effects.  He will come in for an A1c in about 2 months, so we  can check his progress.  If need be I may change him from Victoza to Ozempic at that time  Signed Lamar Blinks, MD

## 2018-10-08 ENCOUNTER — Ambulatory Visit (INDEPENDENT_AMBULATORY_CARE_PROVIDER_SITE_OTHER): Payer: Medicare HMO | Admitting: Family Medicine

## 2018-10-08 ENCOUNTER — Encounter: Payer: Self-pay | Admitting: Family Medicine

## 2018-10-08 VITALS — BP 138/70 | HR 87 | Temp 97.4°F | Resp 18 | Ht 69.0 in | Wt 264.0 lb

## 2018-10-08 DIAGNOSIS — E1140 Type 2 diabetes mellitus with diabetic neuropathy, unspecified: Secondary | ICD-10-CM

## 2018-10-08 DIAGNOSIS — J209 Acute bronchitis, unspecified: Secondary | ICD-10-CM | POA: Diagnosis not present

## 2018-10-08 MED ORDER — DOXYCYCLINE HYCLATE 100 MG PO CAPS: 100.0000 mg | ORAL_CAPSULE | Freq: Two times a day (BID) | ORAL | 0 refills | Status: DC

## 2018-10-08 MED FILL — DOXYCYCLINE HYCLATE 100 MG: 100 | 10 days supply | Qty: 20 | Fill #0

## 2018-10-08 NOTE — Patient Instructions (Signed)
It was good to see you today, I am glad that you had a nice vacation We will treat you for bronchitis with doxycycline antibiotic.  Take this twice a day for 10 days.  Let me know if you are not feeling better in a few days, sooner if you are getting worse  Please schedule a lab visit for March.  We can check your A1c at that time, and then will make any treatment changes.  We might change you to Ozempic as we discussed

## 2018-10-16 ENCOUNTER — Telehealth: Payer: Self-pay | Admitting: Family Medicine

## 2018-10-16 DIAGNOSIS — E1136 Type 2 diabetes mellitus with diabetic cataract: Secondary | ICD-10-CM | POA: Diagnosis not present

## 2018-10-16 DIAGNOSIS — E1165 Type 2 diabetes mellitus with hyperglycemia: Secondary | ICD-10-CM | POA: Diagnosis not present

## 2018-10-16 DIAGNOSIS — G8929 Other chronic pain: Secondary | ICD-10-CM | POA: Diagnosis not present

## 2018-10-16 DIAGNOSIS — E785 Hyperlipidemia, unspecified: Secondary | ICD-10-CM | POA: Diagnosis not present

## 2018-10-16 DIAGNOSIS — E1142 Type 2 diabetes mellitus with diabetic polyneuropathy: Secondary | ICD-10-CM | POA: Diagnosis not present

## 2018-10-16 DIAGNOSIS — I10 Essential (primary) hypertension: Secondary | ICD-10-CM | POA: Diagnosis not present

## 2018-10-16 DIAGNOSIS — R69 Illness, unspecified: Secondary | ICD-10-CM | POA: Diagnosis not present

## 2018-10-16 DIAGNOSIS — H547 Unspecified visual loss: Secondary | ICD-10-CM | POA: Diagnosis not present

## 2018-10-16 NOTE — Telephone Encounter (Signed)
Copied from [phone]. Topic: Quick Communication - See Telephone Encounter >> Oct 16, 2018  3:34 PM Antonieta Iba C wrote: CRM for notification. See Telephone encounter for: 10/16/18.  Helene NP - is calling in to report to PCP that she just completed a yearly home assessment with pt. Pt is complaining of some swelling and edemia on both legs. Lillette Boxer notice that pt is taking Amlodipine which can cause swelling. She would like to know if possible if pt's medication could be changed? Pt says that he is not having any pain or cramping. He is also complaining some about balance.   Helene Phone if needed. [phone]

## 2018-10-17 ENCOUNTER — Encounter: Payer: Self-pay | Admitting: Family Medicine

## 2018-10-17 NOTE — Telephone Encounter (Signed)
Bp meds are  Benazepril 50 hctz 25 Amlodipine 10  BP Readings from Last 3 Encounters:  10/08/18 138/70  08/25/18 (!) 148/90  07/07/18 (!) 109/50   Pulse Readings from Last 3 Encounters:  10/08/18 87  08/25/18 72  07/07/18 64   Could change to a different CCB, ?dilt Message to pt

## 2018-10-20 MED ORDER — DILTIAZEM HCL ER 180 MG PO CP24: 180.0000 mg | ORAL_CAPSULE | Freq: Every day | ORAL | 6 refills | Status: DC

## 2018-10-20 NOTE — Addendum Note (Signed)
Addended by: Lamar Blinks C on: 10/20/2018 09:21 PM   Modules accepted: Orders

## 2018-11-03 IMAGING — DX DG CHEST 1V PORT
1 series · 1 of 1 positions shown · non-contrast
Comparison: 10/05/2017

CLINICAL DATA: Respiratory failure.

EXAM:
PORTABLE CHEST 1 VIEW

[chest ap]
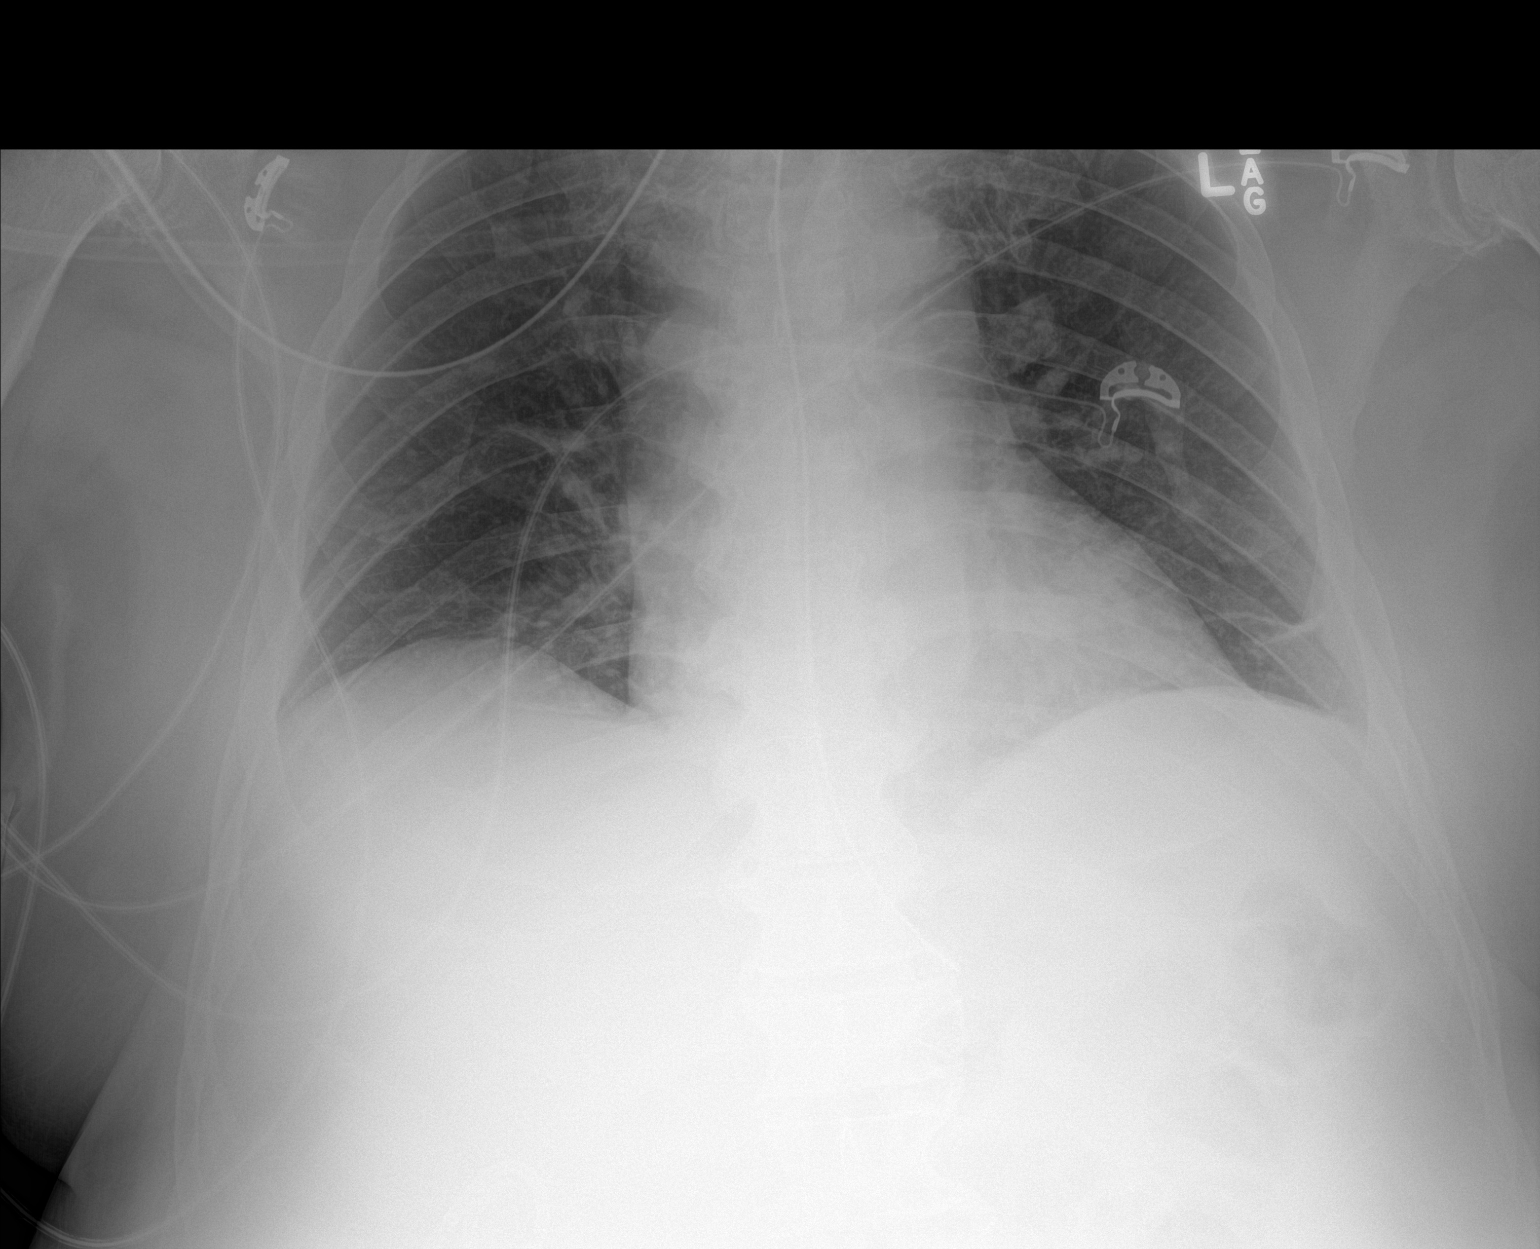

[1 of 1 positions shown; findings below may reference images not displayed]

FINDINGS: Enteric tube has been pulled back as tip is just below the expected
region of the gastroesophageal junction and side-port over the
distal esophagus. This could be advanced approximately 10 cm.
Interval removal of endotracheal tube. Lungs are somewhat
hypoinflated with minimal linear atelectasis/scarring over the left
base. Cardiomediastinal silhouette and remainder the exam is
unchanged.
IMPRESSION: Subtle linear atelectasis/scarring left base.

Enteric tube has been pulled pulled back as tip is now just below
the expected region of the gastroesophageal junction as this could
be advanced approximately 10 cm.

These results will be called to the ordering clinician or
representative by the Radiologist Assistant, and communication
documented in the PACS or zVision Dashboard.

## 2018-11-04 IMAGING — DX DG ABDOMEN 1V
2 series · 2 of 2 positions shown · non-contrast
Comparison: 10/08/2017

CLINICAL DATA: Follow-up ileus

EXAM:
ABDOMEN - 1 VIEW

[abdomen kub (1 of 2)]
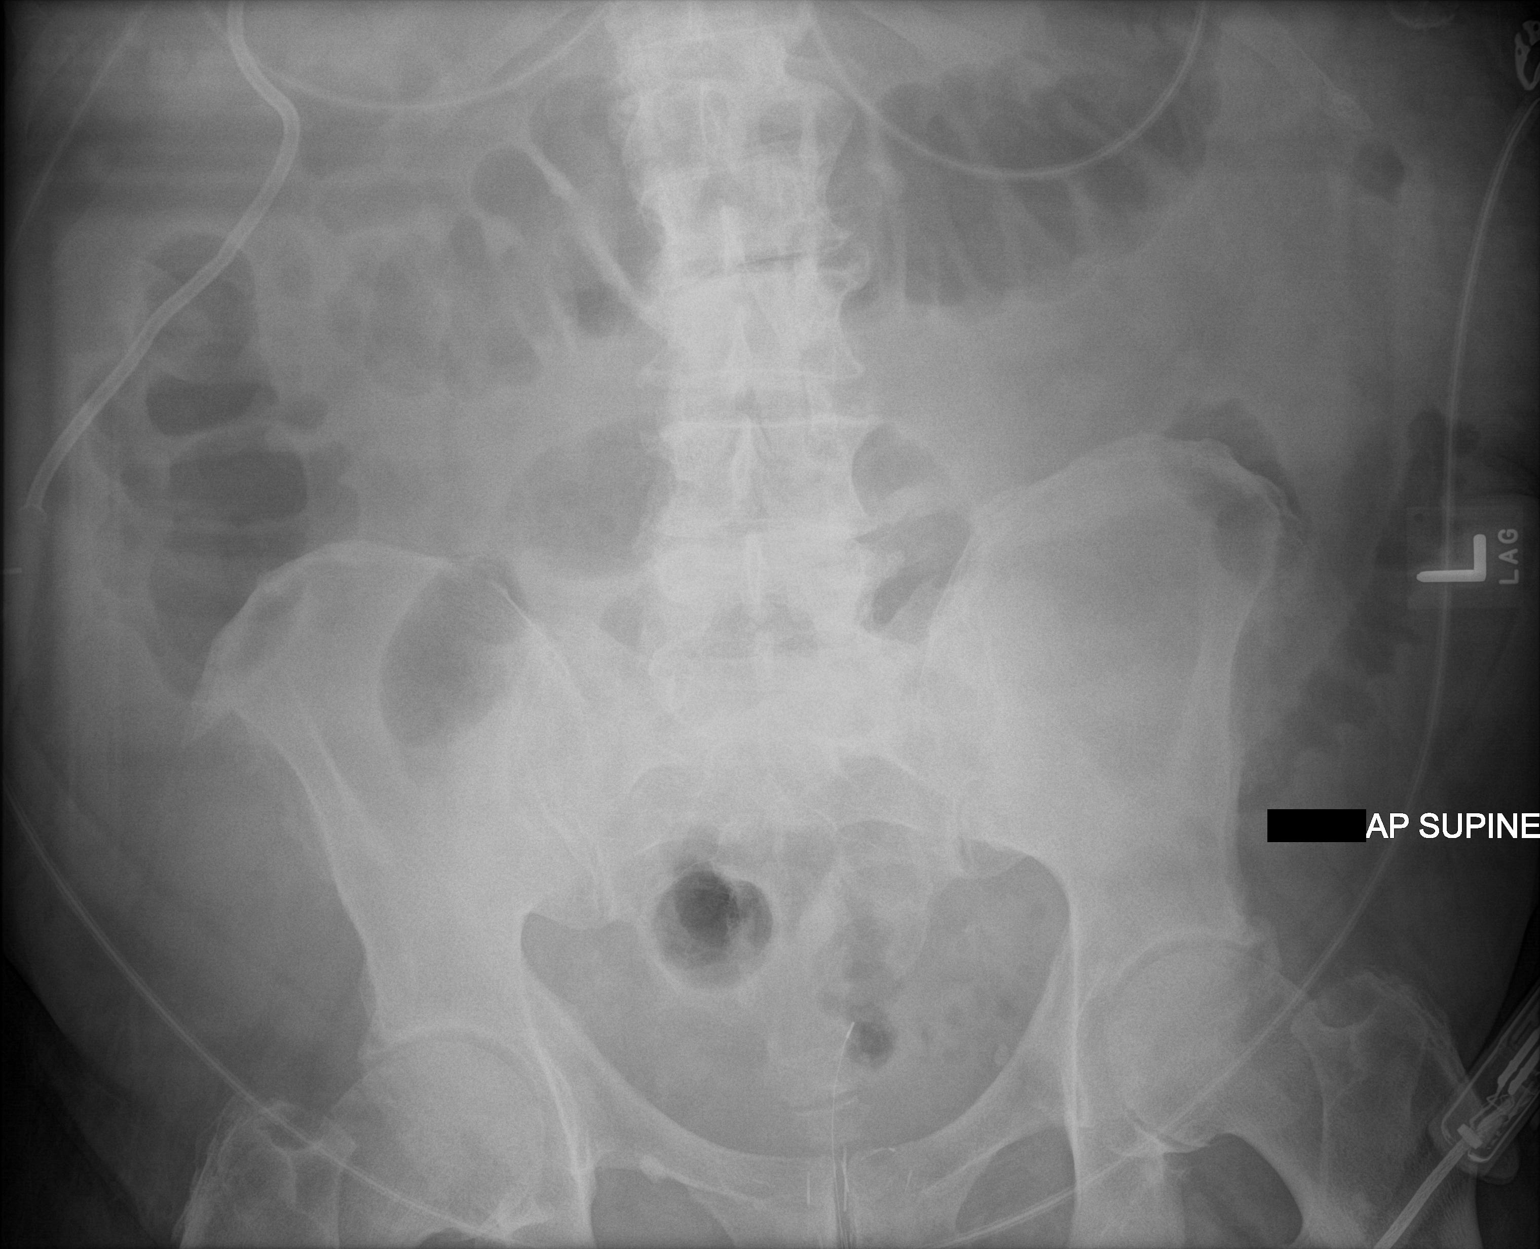

[abdomen kub (2 of 2)]
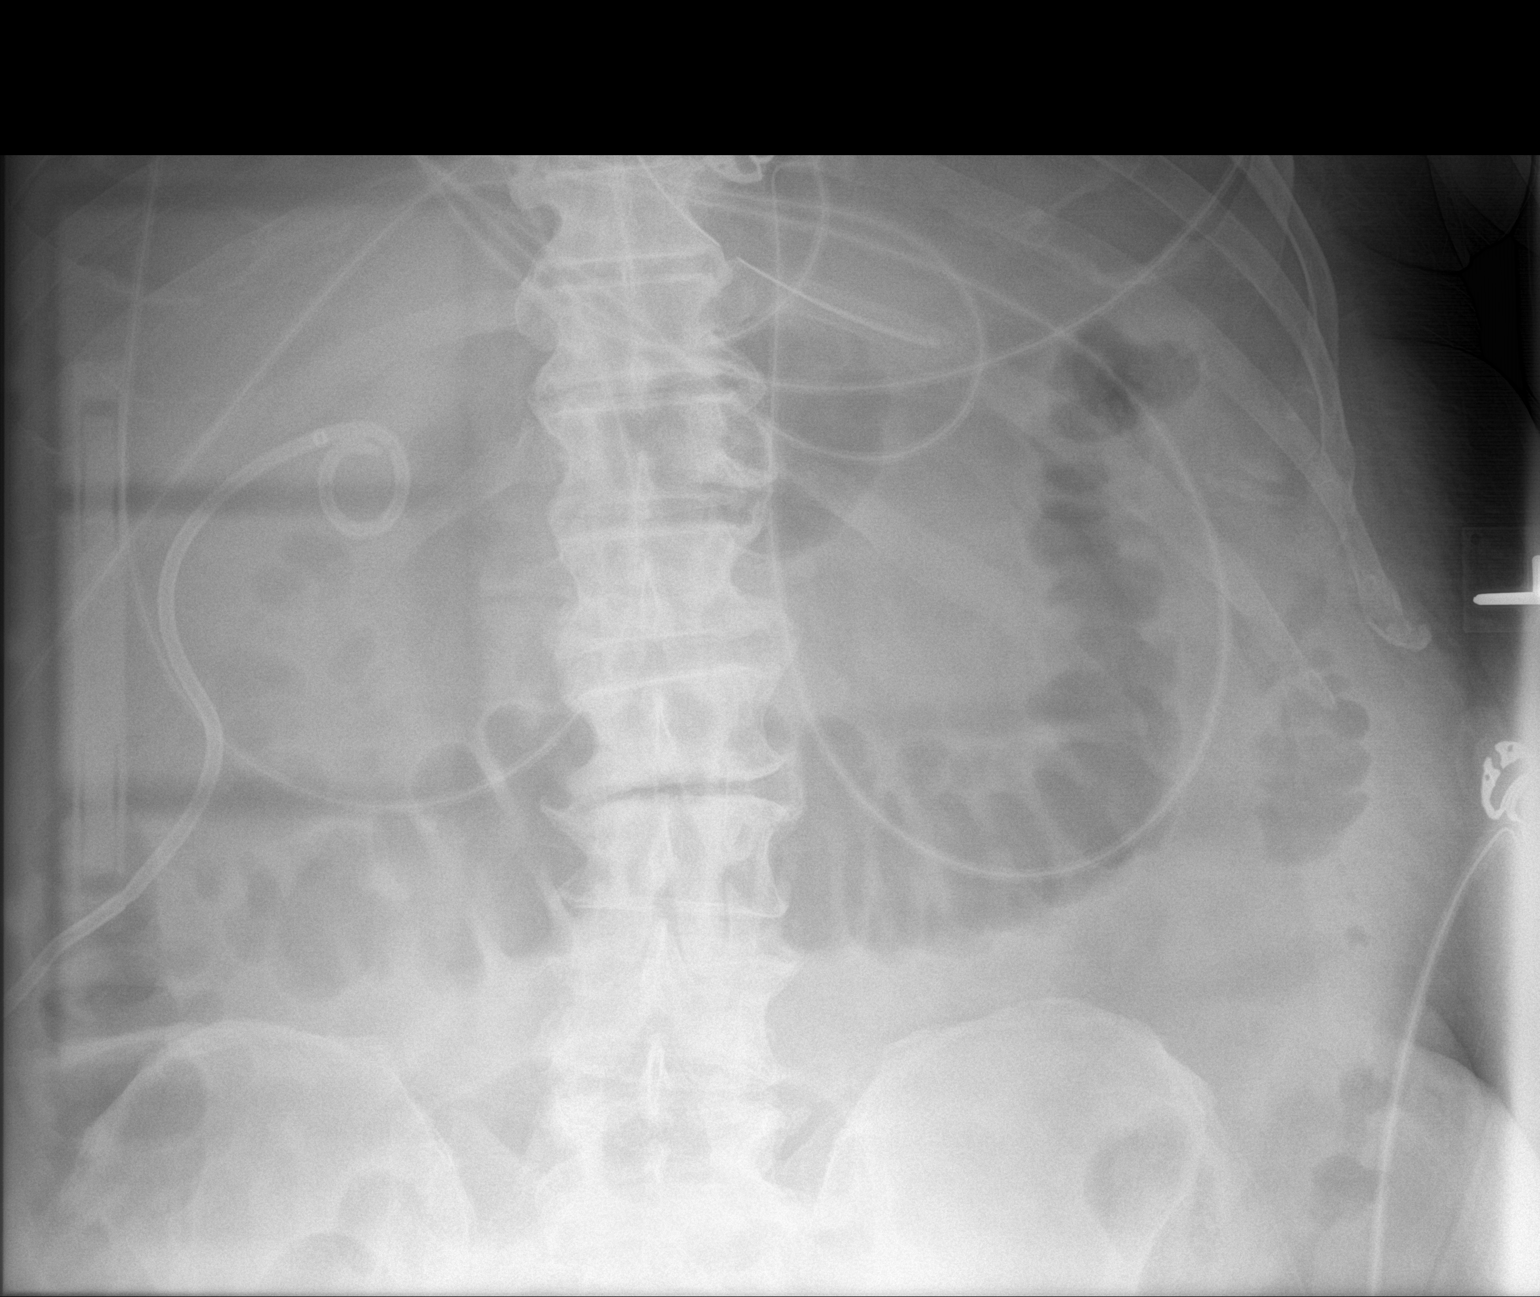

[2 of 2 positions shown; findings below may reference images not displayed]

FINDINGS: Scattered large and small bowel gas is noted. The degree of small
bowel dilatation has nearly completely resolved in the interval from
the prior exam. No free air is seen. A percutaneous cholecystostomy
tube is again noted. Degenerative changes of the lumbar spine are
seen.
IMPRESSION: Near complete resolution of small bowel dilatation when compare with
the prior exam.

## 2018-12-11 ENCOUNTER — Encounter: Payer: Self-pay | Admitting: Family Medicine

## 2018-12-14 NOTE — Progress Notes (Signed)
[place] at [facility] [address], [place], [place] [address] [phone]  Date:  12/15/2018   Name:  Thayer [facility]   DOB:  Aug 27, 1946   MRN:  [id number]  PCP:  [name], MD    Chief Complaint: No chief complaint on file.   History of Present Illness:  MARTICE DOTY is a 73 y.o. very pleasant male patient who presents with the following:  Periodic follow-up visit today- webex visit today due to COVID 19 outbreak Pt confirmed by name and DOB- I also know him by appearance He did make his trip to the [place] with his wife before the current crisis Last seen by myself in December  History of one episode of a fib, DM, HTN, chronic back pain However his a fib was apparently triggered by his acute cholangitis He did wear an event monitor in June of 2019 which did NOT show a fib - is NOT on anticoagulation  In 2019 he had a serious bout of cholangitis and was in the hospital for 10 days   He also gets some of his care from the [place] but has not seen them in a while  In December his A1c had gone up, we added [place] to his regimen but he stopped due to SE I also changed his amlodipine to diltiazem in January due to swelling. However this seemed to make him feel sleepy so he actually changed back to amlodipine on his own - swelling has not been a big issue for him at this time   He has noted some blood sugar readings in the 200s last week, 169 this am- higher than is normal for him  He also is concerned about high BP readings  Last time he checked his BP it was 172/80; he is not sure if this was an outlier Pt checked BP for me during visit 198/94- he has not yet taken his am meds  He does normally take them around this time Pulse 78  His weight is about the same- however per home weights he has lost some since January   Wt Readings from Last 3 Encounters:  10/08/18 264 lb (119.7 kg)  08/25/18 262 lb (118.8 kg)   07/07/18 252 lb (114.3 kg)   His last home weight was 254  Lab Results  Component Value Date   HGBA1C 8.2 08/12/2018   A1c is due Eye exam: UTD Foot exam: UTD Colon: UTD Immune: due for tdap and pneumovax unless given at the [facility] 81 lotensisn Amlodipine 10 pepcid Glipizide 10 BID HCTZ 25 victoza Metformin 1000 BID niaspan  Will order labs for him- he plans to come in tomorrow at 9:30  BP Readings from Last 3 Encounters:  12/15/18 (!) 198/94  10/08/18 138/70  08/25/18 (!) 148/90   Pulse Readings from Last 3 Encounters:  12/15/18 78  10/08/18 87  08/25/18 72      Patient Active Problem List   Diagnosis Date Noted  . Paroxysmal atrial fibrillation ([place]) 12/16/2017  . SBO (small bowel obstruction) ([place]) 10/08/2017  . ATN (acute tubular necrosis) ([place]) 10/08/2017  . Septic shock ([place]) 10/04/2017  . Acute cholecystitis s/p perc cholecystomy drainage 10/04/2017   . Acute respiratory failure with hypoxia ([place])   . Chronic back pain 02/08/2016  . Controlled type 2 diabetes mellitus with diabetic neuropathy, without long-term current use of insulin ([place]) 02/08/2016  . Insomnia 02/08/2016  . History  of diverticulitis 02/08/2016  . HYPERTENSION, BENIGN ESSENTIAL, UNCONTROLLED 03/12/2009  . ABNORMAL ELECTROCARDIOGRAM 03/12/2009    Past Medical History:  Diagnosis Date  . AKI (acute kidney injury) ([place])   . Anxiety   . Arthritis   . Barrett esophagus   . Depression   . Diabetes mellitus without complication ([place])    type 2  . GERD (gastroesophageal reflux disease)   . Hypertension   . Neuromuscular disorder (HCC)    neuropathy but being treated  . PONV (postoperative nausea and vomiting)   . Sleep apnea    has a sleep study but refuses that he has sleep study, not wearing CPAP    Past Surgical History:  Procedure Laterality Date  . BACK SURGERY     X2 - lower  . CHOLECYSTECTOMY N/A 12/20/2017   Procedure: LAPAROSCOPIC CHOLECYSTECTOMY WITH  INTRAOPERATIVE CHOLANGIOGRAM POSSIBLE OPEN;  Surgeon: Fanny Skates, MD;  Location: [facility];  Service: General;  Laterality: N/A;  . COLONOSCOPY    . ESOPHAGOGASTRODUODENOSCOPY    . IR RADIOLOGIST EVAL & MGMT  10/30/2017  . IR RADIOLOGIST EVAL & MGMT  11/14/2017  . PROSTATE SURGERY  2017   uro lift per pt.  . REPLACEMENT TOTAL KNEE Left   . ROTATOR CUFF REPAIR     Bil    Social History   Tobacco Use  . Smoking status: Former Smoker    Last attempt to quit: 09/18/1975    Years since quitting: 43.2  . Smokeless tobacco: Never Used  Substance Use Topics  . Alcohol use: Yes    Alcohol/week: 0.0 standard drinks    Comment: Rarely  . Drug use: Never    Family History  Problem Relation Age of Onset  . Heart failure Father   . Heart disease Father   . Emphysema Mother   . Colon cancer Maternal Grandmother     Allergies  Allergen Reactions  . Codeine     hallucinations  . Statins     Has tried several cannot tolerate    Medication list has been reviewed and updated.  Current Outpatient Medications on File Prior to Visit  Medication Sig Dispense Refill  . Acetylcarnitine HCl 500 MG CAPS Take 500 mg by mouth daily.    . Alcohol Swabs (B-D SINGLE USE SWABS REGULAR) PADS USE PRIOR TO INSULIN DOSE 200 each 7  . Alpha-Lipoic Acid 600 MG CAPS Take 600 mg by mouth daily.    Marland Kitchen aspirin 81 MG chewable tablet Chew 1 tablet (81 mg total) by mouth daily. 30 tablet 0  . B-D UF III MINI PEN NEEDLES 31G X 5 MM MISC     . benazepril (LOTENSIN) 40 MG tablet Take 1 tablet (40 mg total) by mouth daily. 90 tablet 3  . colestipol (COLESTID) 1 g tablet Take 1 tablet (1 g total) by mouth 2 (two) times daily. (Patient not taking: Reported on 06/23/2018) 60 tablet 2  . diclofenac (VOLTAREN) 75 MG EC tablet TAKE 1 TABLET TWICE A DAY  AS NEEDED FOR JOINT PAINS 180 tablet 1  . famotidine (PEPCID) 20 MG tablet Take 1 tablet (20 mg total) by mouth 2 (two) times daily. 180 tablet 3  . glipiZIDE (GLUCOTROL)  10 MG tablet TAKE 1 TABLET TWICE A DAY  BEFORE A MEAL 180 tablet 1  . glucose blood (ONETOUCH VERIO) test strip Use as instructed 100 each 12  . hydrochlorothiazide (HYDRODIURIL) 25 MG tablet Take 1 tablet (25 mg total) by mouth daily. 90 tablet 3  .  Lancets (ONETOUCH DELICA PLUS [id number]) MISC USE TWO TIMES A DAY TO     CHECK BLOOD SUGAR 100 each 3  . liraglutide (VICTOZA) 18 MG/3ML SOPN 1.2 ml    . Melatonin 3 MG TABS Take 1 tablet by mouth at bedtime.     . metFORMIN (GLUCOPHAGE) 1000 MG tablet TAKE 1 TABLET TWICE A DAY  WITH A MEAL 180 tablet 1  . Multiple Vitamin (MULTIVITAMIN) tablet Take 1 tablet by mouth daily.    . niacin (NIASPAN) 500 MG CR tablet Take 1 tablet (500 mg total) by mouth at bedtime. 30 tablet 6  . omeprazole (PRILOSEC) 40 MG capsule TAKE 1 CAPSULE DAILY 90 capsule 3  . tadalafil (CIALIS) 5 MG tablet Take 1 tablet (5 mg total) by mouth daily. 90 tablet 3  . Turmeric 450 MG CAPS Take 1 tablet by mouth daily.     . vitamin C (ASCORBIC ACID) 500 MG tablet Take 500 mg by mouth daily.     No current facility-administered medications on file prior to visit.     Review of Systems:  As per HPI- otherwise negative. No fever or chills No cough   Physical Examination: Vitals:   12/15/18 1009  BP: (!) 198/94  Pulse: 78   There were no vitals filed for this visit. There is no height or weight on file to calculate BMI. Ideal Body Weight:    Looks well, no apparent tachypnea or distress    Assessment and Plan: Controlled type 2 diabetes mellitus with diabetic neuropathy, without long-term current use of insulin ([place])  Essential hypertension  Medication monitoring encounter  Dyslipidemia  Virtual visit today due to COVID-19 outbreak.  Gershon Mussel is overall doing okay, but is concerned about elevation of his blood sugar and also off his blood pressure We discussed the plan together in detail.  He is currently taking glipizide 10 twice daily.  We will have him increase to  15 mg in the morning.  If sugars are still high after a week, he can increase to 15 twice daily  We will also add metoprolol 25 to his regimen I have asked him to continue to monitor his blood pressure, pulse, and blood sugars.  He will send me an email message with an update in a few days  I have ordered labs for him, he plans to come in for lab visit tomorrow  Per most recent labs creat clearance >80 Signed Lamar Blinks, MD

## 2018-12-15 ENCOUNTER — Encounter: Payer: Self-pay | Admitting: Family Medicine

## 2018-12-15 ENCOUNTER — Ambulatory Visit (INDEPENDENT_AMBULATORY_CARE_PROVIDER_SITE_OTHER): Payer: Medicare HMO | Admitting: Family Medicine

## 2018-12-15 ENCOUNTER — Other Ambulatory Visit: Payer: Self-pay

## 2018-12-15 ENCOUNTER — Telehealth: Payer: Self-pay

## 2018-12-15 VITALS — BP 198/94 | HR 78

## 2018-12-15 DIAGNOSIS — I10 Essential (primary) hypertension: Secondary | ICD-10-CM

## 2018-12-15 DIAGNOSIS — N401 Enlarged prostate with lower urinary tract symptoms: Secondary | ICD-10-CM

## 2018-12-15 DIAGNOSIS — E785 Hyperlipidemia, unspecified: Secondary | ICD-10-CM

## 2018-12-15 DIAGNOSIS — E1140 Type 2 diabetes mellitus with diabetic neuropathy, unspecified: Secondary | ICD-10-CM

## 2018-12-15 DIAGNOSIS — N529 Male erectile dysfunction, unspecified: Secondary | ICD-10-CM

## 2018-12-15 DIAGNOSIS — Z5181 Encounter for therapeutic drug level monitoring: Secondary | ICD-10-CM | POA: Diagnosis not present

## 2018-12-15 MED ORDER — METOPROLOL SUCCINATE ER 25 MG PO TB24: 25.0000 mg | ORAL_TABLET | Freq: Every day | ORAL | 6 refills | Status: DC

## 2018-12-15 MED ORDER — AMLODIPINE BESYLATE 10 MG PO TABS: ORAL_TABLET | ORAL | 3 refills | Status: DC

## 2018-12-15 NOTE — Telephone Encounter (Signed)
Error

## 2018-12-15 NOTE — Patient Instructions (Addendum)
It was great to talk to you today, please continue to stay safe.  As we discussed, let us increase your glipizide to 15 mg in the morning, continue 10 mg at night for now.  If your sugars remain higher than about 150 fasting, you may increase to 15 twice a day.  Do watch for any low blood sugars.  If your blood sugar goes too low, decrease your dose of glipizide  Let us also add metoprolol 25 mg to your blood pressure regimen.  Take this once a day  Please monitor your blood pressure, pulse, and blood sugar at home.  Please send me an update regarding your readings via MyChart  We need to monitor your pulse with the addition of metoprolol.  If it is dropping less than 60 please do let me know  Take care!

## 2018-12-17 ENCOUNTER — Other Ambulatory Visit: Payer: Medicare HMO

## 2018-12-23 ENCOUNTER — Other Ambulatory Visit: Payer: Self-pay | Admitting: Family Medicine

## 2018-12-23 DIAGNOSIS — R69 Illness, unspecified: Secondary | ICD-10-CM | POA: Diagnosis not present

## 2018-12-23 DIAGNOSIS — I10 Essential (primary) hypertension: Secondary | ICD-10-CM

## 2018-12-23 DIAGNOSIS — E1140 Type 2 diabetes mellitus with diabetic neuropathy, unspecified: Secondary | ICD-10-CM

## 2018-12-24 MED ORDER — GLIPIZIDE 10 MG PO TABS: ORAL_TABLET | ORAL | 3 refills | Status: DC

## 2018-12-24 MED ORDER — HYDROCHLOROTHIAZIDE 25 MG PO TABS: 25.0000 mg | ORAL_TABLET | Freq: Every day | ORAL | 3 refills | Status: DC

## 2018-12-24 MED ORDER — DICLOFENAC SODIUM 75 MG PO TBEC: DELAYED_RELEASE_TABLET | ORAL | 2 refills | Status: DC

## 2018-12-24 MED ORDER — BENAZEPRIL HCL 40 MG PO TABS: 40.0000 mg | ORAL_TABLET | Freq: Every day | ORAL | 3 refills | Status: DC

## 2018-12-24 MED ORDER — TADALAFIL 5 MG PO TABS: 5.0000 mg | ORAL_TABLET | Freq: Every day | ORAL | 3 refills | Status: DC

## 2018-12-24 MED ORDER — AMLODIPINE BESYLATE 10 MG PO TABS: ORAL_TABLET | ORAL | 3 refills | Status: DC

## 2018-12-24 MED ORDER — METFORMIN HCL 1000 MG PO TABS: ORAL_TABLET | ORAL | 3 refills | Status: DC

## 2018-12-24 NOTE — Addendum Note (Signed)
Addended by: Lamar Blinks C on: 12/24/2018 08:32 AM   Modules accepted: Orders

## 2018-12-30 ENCOUNTER — Encounter: Payer: Self-pay | Admitting: Family Medicine

## 2018-12-31 MED ORDER — BD PEN NEEDLE MINI U/F 31G X 5 MM MISC: 1 refills | Status: DC

## 2019-03-24 ENCOUNTER — Other Ambulatory Visit: Payer: Self-pay | Admitting: Family Medicine

## 2019-03-24 DIAGNOSIS — R69 Illness, unspecified: Secondary | ICD-10-CM | POA: Diagnosis not present

## 2019-03-25 DIAGNOSIS — R69 Illness, unspecified: Secondary | ICD-10-CM | POA: Diagnosis not present

## 2019-05-27 ENCOUNTER — Other Ambulatory Visit: Payer: Self-pay

## 2019-05-27 ENCOUNTER — Encounter: Payer: Self-pay | Admitting: Family Medicine

## 2019-05-28 ENCOUNTER — Encounter: Payer: Self-pay | Admitting: Family Medicine

## 2019-05-28 ENCOUNTER — Ambulatory Visit (INDEPENDENT_AMBULATORY_CARE_PROVIDER_SITE_OTHER): Payer: Medicare HMO | Admitting: Family Medicine

## 2019-05-28 VITALS — BP 124/80 | HR 74 | Temp 96.9°F | Resp 18 | Ht 69.0 in | Wt 254.0 lb

## 2019-05-28 DIAGNOSIS — Z3141 Encounter for fertility testing: Secondary | ICD-10-CM

## 2019-05-28 DIAGNOSIS — D649 Anemia, unspecified: Secondary | ICD-10-CM | POA: Diagnosis not present

## 2019-05-28 DIAGNOSIS — E1140 Type 2 diabetes mellitus with diabetic neuropathy, unspecified: Secondary | ICD-10-CM

## 2019-05-28 DIAGNOSIS — I10 Essential (primary) hypertension: Secondary | ICD-10-CM

## 2019-05-28 DIAGNOSIS — N529 Male erectile dysfunction, unspecified: Secondary | ICD-10-CM

## 2019-05-28 DIAGNOSIS — E785 Hyperlipidemia, unspecified: Secondary | ICD-10-CM

## 2019-05-28 DIAGNOSIS — Z23 Encounter for immunization: Secondary | ICD-10-CM | POA: Diagnosis not present

## 2019-05-28 DIAGNOSIS — N401 Enlarged prostate with lower urinary tract symptoms: Secondary | ICD-10-CM

## 2019-05-28 LAB — COMPREHENSIVE METABOLIC PANEL
ALT: 27 U/L (ref 0–53)
AST: 20 U/L (ref 0–37)
Albumin: 4.4 g/dL (ref 3.5–5.2)
Alkaline Phosphatase: 40 U/L (ref 39–117)
BUN: 31 mg/dL — ABNORMAL HIGH (ref 6–23)
CO2: 24 mEq/L (ref 19–32)
Calcium: 9.4 mg/dL (ref 8.4–10.5)
Chloride: 105 mEq/L (ref 96–112)
Creatinine, Ser: 1.22 mg/dL (ref 0.40–1.50)
GFR: 58.58 mL/min — ABNORMAL LOW (ref >60.00)
Glucose, Bld: 116 mg/dL — ABNORMAL HIGH (ref 70–99)
Potassium: 4.9 mEq/L (ref 3.5–5.1)
Sodium: 137 mEq/L (ref 135–145)
Total Bilirubin: 0.3 mg/dL (ref 0.2–1.2)
Total Protein: 7.5 g/dL (ref 6.0–8.3)

## 2019-05-28 LAB — CBC
HCT: 37.5 % — ABNORMAL LOW (ref 39.0–52.0)
Hemoglobin: 12.2 g/dL — ABNORMAL LOW (ref 13.0–17.0)
MCHC: 32.5 g/dL (ref 30.0–36.0)
MCV: 85.2 fl (ref 78.0–100.0)
Platelets: 276 10*3/uL (ref 150.0–400.0)
RBC: 4.4 Mil/uL (ref 4.22–5.81)
RDW: 14.6 % (ref 11.5–15.5)
WBC: 7.2 10*3/uL (ref 4.0–10.5)

## 2019-05-28 LAB — LIPID PANEL
Cholesterol: 179 mg/dL (ref 0–200)
HDL: 28.2 mg/dL — ABNORMAL LOW (ref >39.00)
Total CHOL/HDL Ratio: 6
Triglycerides: 529 mg/dL — ABNORMAL HIGH (ref 0.0–149.0)

## 2019-05-28 LAB — LDL CHOLESTEROL, DIRECT: Direct LDL: 88 mg/dL

## 2019-05-28 LAB — PSA: PSA: 6.75 ng/mL — ABNORMAL HIGH (ref 0.10–4.00)

## 2019-05-28 LAB — HEMOGLOBIN A1C: Hgb A1c MFr Bld: 7.3 % — ABNORMAL HIGH (ref 4.6–6.5)

## 2019-05-28 MED ORDER — TADALAFIL 20 MG PO TABS: 20.0000 mg | ORAL_TABLET | Freq: Every day | ORAL | 3 refills | Status: DC | PRN

## 2019-05-28 MED ORDER — NIACIN ER (ANTIHYPERLIPIDEMIC) 500 MG PO TBCR: 500.0000 mg | EXTENDED_RELEASE_TABLET | Freq: Every day | ORAL | 3 refills | Status: DC

## 2019-05-28 NOTE — Progress Notes (Addendum)
[place] at [facility] [address] [place], [place] [address] [phone]  Date:  05/28/2019   Name:  Timothy Chapman   DOB:  [birth year]   MRN:  [id number]  PCP:  [name], MD    Chief Complaint: Discuss Medication and Flu Vaccine   History of Present Illness:  Timothy Chapman is a 73 y.o. very pleasant male patient who presents with the following:  Here today for follow-up visit, flu shot.  Last seen by myself in March for virtual visit He does get some care through the [place]- however some of these appointments have been canceled due to the pandemic He thinks last tetanus was less than 10 yeas ago per the [place]  History of diabetes, hypertension, chronic back pain, severe illness in 2019-he became ill with sepsis due to cholangitis, had acute respiratory failure and required intubation He has overall doing ok during the pandemic- it is boring staying at home but he has been physically well He is doing some walking outdoors and trying to stay active Working on a keto diet which he likes ok- he notes that his glucose and blood pressure are better and he has lost some weight Wt Readings from Last 3 Encounters:  05/28/19 254 lb (115.2 kg)  10/08/18 264 lb (119.7 kg)  08/25/18 262 lb (118.8 kg)    He has bilateral hip pain, this holds him back from exercise some.  He notes having hip films which showed arthritis in the past, he is not at this point interested in possible surgery He uses voltaren for his hips and lower back. He has lumbar stenosis   At our last visit his sugars and blood pressure were a bit higher, we increased glipizide dose, added metoprolol 25 He is back to glipizide 10 BID as his glucose numbers were going down.  He was concerned his glucose might get too low He is not using victoza all the time right now -in fact notes he is rarely using it for the same reason He is not taking hctx right now - has not  needed it for lower extremity edema BP Readings from Last 3 Encounters:  05/28/19 124/80  12/15/18 (!) 198/94  10/08/18 138/70   Lab Results  Component Value Date   HGBA1C 8.2 08/12/2018   Needs labs today Flu shot due-give today  Can update Pneumovax- given today   I referred him to urology in 2018 with elevation of PSA; he reports that he was seen by urology at the [facility] for this issue.  His PSA was improving, he has not followed up with them recently however  He also notes concerns about fertility His wife is 42 yo They have been trying to become pregnant for about one year Timothy Chapman does have a 57 yo daughter- he notes that her conception took 5 years, however they were "not really trying" As far as he knows his wife is having normal menstrual cycles and has never been pregnant in the past   Timothy Chapman does suffer from erectile dysfunction.  He uses Cialis 5 mg daily for BPH and will take up to 20 mg daily for erectile assistance  Patient Active Problem List   Diagnosis Date Noted  . Paroxysmal atrial fibrillation ([place]) 12/16/2017  . SBO (small bowel obstruction) ([place]) 10/08/2017  . ATN (acute tubular necrosis) ([place]) 10/08/2017  . Septic shock ([place]) 10/04/2017  . Acute cholecystitis s/p perc cholecystomy drainage 10/04/2017   .  Acute respiratory failure with hypoxia ([place])   . Chronic back pain 02/08/2016  . Controlled type 2 diabetes mellitus with diabetic neuropathy, without long-term current use of insulin ([place]) 02/08/2016  . Insomnia 02/08/2016  . History of diverticulitis 02/08/2016  . HYPERTENSION, BENIGN ESSENTIAL, UNCONTROLLED 03/12/2009  . ABNORMAL ELECTROCARDIOGRAM 03/12/2009    Past Medical History:  Diagnosis Date  . AKI (acute kidney injury) ([place])   . Anxiety   . Arthritis   . Barrett esophagus   . Depression   . Diabetes mellitus without complication ([place])    type 2  . GERD (gastroesophageal reflux disease)   . Hypertension   . Neuromuscular disorder (HCC)     neuropathy but being treated  . PONV (postoperative nausea and vomiting)   . Sleep apnea    has a sleep study but refuses that he has sleep study, not wearing CPAP    Past Surgical History:  Procedure Laterality Date  . BACK SURGERY     X2 - lower  . CHOLECYSTECTOMY N/A 12/20/2017   Procedure: LAPAROSCOPIC CHOLECYSTECTOMY WITH INTRAOPERATIVE CHOLANGIOGRAM POSSIBLE OPEN;  Surgeon: Fanny Skates, MD;  Location: [facility];  Service: General;  Laterality: N/A;  . COLONOSCOPY    . ESOPHAGOGASTRODUODENOSCOPY    . IR RADIOLOGIST EVAL & MGMT  10/30/2017  . IR RADIOLOGIST EVAL & MGMT  11/14/2017  . PROSTATE SURGERY  2017   uro lift per pt.  . REPLACEMENT TOTAL KNEE Left   . ROTATOR CUFF REPAIR     Bil    Social History   Tobacco Use  . Smoking status: Former Smoker    Quit date: 09/18/1975    Years since quitting: 43.7  . Smokeless tobacco: Never Used  Substance Use Topics  . Alcohol use: Yes    Alcohol/week: 0.0 standard drinks    Comment: Rarely  . Drug use: Never    Family History  Problem Relation Age of Onset  . Heart failure Father   . Heart disease Father   . Emphysema Mother   . Colon cancer Maternal Grandmother     Allergies  Allergen Reactions  . Codeine     hallucinations  . Statins     Has tried several cannot tolerate    Medication list has been reviewed and updated.  Current Outpatient Medications on File Prior to Visit  Medication Sig Dispense Refill  . Acetylcarnitine HCl 500 MG CAPS Take 500 mg by mouth daily.    . Alcohol Swabs (B-D SINGLE USE SWABS REGULAR) PADS USE PRIOR TO INSULIN DOSE 200 each 7  . Alpha-Lipoic Acid 600 MG CAPS Take 600 mg by mouth daily.    Marland Kitchen amLODipine (NORVASC) 10 MG tablet TAKE 1 TABLET DAILY 90 tablet 3  . amLODipine (NORVASC) 10 MG tablet Take 10 mg daily. 90 tablet 3  . aspirin 81 MG chewable tablet Chew 1 tablet (81 mg total) by mouth daily. 30 tablet 0  . B-D UF III MINI PEN NEEDLES 31G X 5 MM MISC To use w/ Victoza 100  each 1  . benazepril (LOTENSIN) 40 MG tablet Take 1 tablet (40 mg total) by mouth daily. 90 tablet 3  . diclofenac (VOLTAREN) 75 MG EC tablet TAKE 1 TABLET TWICE A DAY  AS NEEDED FOR JOINT PAINS 180 tablet 1  . diclofenac (VOLTAREN) 75 MG EC tablet TAKE 1 TABLET TWICE A DAY  AS NEEDED FOR JOINT PAINS 180 tablet 2  . famotidine (PEPCID) 20 MG tablet Take 1 tablet (20 mg total)  by mouth 2 (two) times daily. 180 tablet 3  . glipiZIDE (GLUCOTROL) 10 MG tablet TAKE 1 TABLET TWICE A DAY  BEFORE A MEAL 180 tablet 1  . glipiZIDE (GLUCOTROL) 10 MG tablet TAKE 1 TABLET TWICE A DAY  BEFORE A MEAL 180 tablet 3  . hydrochlorothiazide (HYDRODIURIL) 25 MG tablet Take 1 tablet (25 mg total) by mouth daily. 90 tablet 3  . Lancets (ONETOUCH DELICA PLUS [id number]) MISC USE TWO TIMES A DAY TO     CHECK BLOOD SUGAR 100 each 3  . liraglutide (VICTOZA) 18 MG/3ML SOPN 1.2 ml    . Melatonin 3 MG TABS Take 1 tablet by mouth at bedtime.     . metFORMIN (GLUCOPHAGE) 1000 MG tablet TAKE 1 TABLET TWICE A DAY  WITH A MEAL 180 tablet 1  . metFORMIN (GLUCOPHAGE) 1000 MG tablet TAKE 1 TABLET TWICE A DAY  WITH A MEAL 180 tablet 3  . metoprolol succinate (TOPROL-XL) 25 MG 24 hr tablet Take 1 tablet (25 mg total) by mouth daily. 30 tablet 6  . Multiple Vitamin (MULTIVITAMIN) tablet Take 1 tablet by mouth daily.    . niacin (NIASPAN) 500 MG CR tablet Take 1 tablet (500 mg total) by mouth at bedtime. 30 tablet 6  . omeprazole (PRILOSEC) 40 MG capsule TAKE 1 CAPSULE DAILY 90 capsule 3  . ONETOUCH VERIO test strip USE AS INSTRUCTED 100 strip 7  . tadalafil (CIALIS) 5 MG tablet Take 1 tablet (5 mg total) by mouth daily. 90 tablet 3  . Turmeric 450 MG CAPS Take 1 tablet by mouth daily.     . vitamin C (ASCORBIC ACID) 500 MG tablet Take 500 mg by mouth daily.     No current facility-administered medications on file prior to visit.     Review of Systems:  As per HPI- otherwise negative.   Physical Examination: Vitals:   05/28/19  1026  BP: 124/80  Pulse: 74  Resp: 18  Temp: (!) 96.9 F (36.1 C)  SpO2: 98%   Vitals:   05/28/19 1026  Weight: 254 lb (115.2 kg)  Height: 5\' 9"  (1.753 m)   Body mass index is 37.51 kg/m. Ideal Body Weight: Weight in (lb) to have BMI = 25: 168.9  GEN: WDWN, NAD, Non-toxic, A & O x 3, obese, looks well HEENT: Atraumatic, Normocephalic. Neck supple. No masses, No LAD. Ears and Nose: No external deformity. CV: RRR, No M/G/R. No JVD. No thrill. No extra heart sounds. PULM: CTA B, no wheezes, crackles, rhonchi. No retractions. No resp. distress. No accessory muscle use. ABD: S, NT, ND, +BS. No rebound. No HSM. EXTR: No c/c.  Minimal lower extremity edema bilaterally NEURO Normal gait.  PSYCH: Normally interactive. Conversant. Not depressed or anxious appearing.  Calm demeanor.    Assessment and Plan: Controlled type 2 diabetes mellitus with diabetic neuropathy, without long-term current use of insulin ([place]) - Plan: Comprehensive metabolic panel, Hemoglobin A1c  Essential hypertension - Plan: CBC, Comprehensive metabolic panel  Erectile dysfunction, unspecified erectile dysfunction type - Plan: tadalafil (CIALIS) 20 MG tablet  Dyslipidemia - Plan: Lipid panel, niacin (NIASPAN) 500 MG CR tablet  Benign prostatic hyperplasia with lower urinary tract symptoms, symptom details unspecified - Plan: PSA  Anemia, unspecified type - Plan: CBC  Immunization due - Plan: Pneumococcal polysaccharide vaccine 23-valent greater than or equal to 2yo subcutaneous/IM  Needs flu shot - Plan: Flu Vaccine QUAD High Dose(Fluad)  Fertility testing - Plan: Ambulatory referral to Urology  Here today for  follow-up visit Time is been following a keto diet, he has lost some weight and notes his blood sugars have been lower.  He decreased his glipizide to 10 mg twice a day.  Continues to take metformin.  Check A1c today Blood pressure under good control Lipid panel pending, refilled Niaspan Flu and  pneumonia vaccines given today Timothy Chapman has been seen by urology with the [facility] in the past, but notes that his urologist has now left the practice.  He is interested in pursuing a fertility evaluation.  Will refer to private urology For erectile dysfunction he takes Cialis 5 mg daily, will take up to 20 mg as needed for sexual activity.  He knows the max daily dose is 20 mg  Signed Lamar Blinks, MD  Received his labs, message to patient Results for orders placed or performed in visit on 05/28/19  CBC  Result Value Ref Range   WBC 7.2 4.0 - 10.5 K/uL   RBC 4.40 4.22 - 5.81 Mil/uL   Platelets 276.0 150.0 - 400.0 K/uL   Hemoglobin 12.2 (L) 13.0 - 17.0 g/dL   HCT 37.5 (L) 39.0 - 52.0 %   MCV 85.2 78.0 - 100.0 fl   MCHC 32.5 30.0 - 36.0 g/dL   RDW 14.6 11.5 - 15.5 %  Comprehensive metabolic panel  Result Value Ref Range   Sodium 137 135 - 145 mEq/L   Potassium 4.9 3.5 - 5.1 mEq/L   Chloride 105 96 - 112 mEq/L   CO2 24 19 - 32 mEq/L   Glucose, Bld 116 (H) 70 - 99 mg/dL   BUN 31 (H) 6 - 23 mg/dL   Creatinine, Ser 1.22 0.40 - 1.50 mg/dL   Total Bilirubin 0.3 0.2 - 1.2 mg/dL   Alkaline Phosphatase 40 39 - 117 U/L   AST 20 0 - 37 U/L   ALT 27 0 - 53 U/L   Total Protein 7.5 6.0 - 8.3 g/dL   Albumin 4.4 3.5 - 5.2 g/dL   Calcium 9.4 8.4 - 10.5 mg/dL   GFR 58.58 (L) >60.00 mL/min  Hemoglobin A1c  Result Value Ref Range   Hgb A1c MFr Bld 7.3 (H) 4.6 - 6.5 %  Lipid panel  Result Value Ref Range   Cholesterol 179 0 - 200 mg/dL   Triglycerides (H) 0.0 - 149.0 mg/dL    529.0 Triglyceride is over 400; calculations on Lipids are invalid.   HDL 28.20 (L) >39.00 mg/dL   Total CHOL/HDL Ratio 6   PSA  Result Value Ref Range   PSA 6.75 (H) 0.10 - 4.00 ng/mL  LDL cholesterol, direct  Result Value Ref Range   Direct LDL 88.0 mg/dL

## 2019-05-28 NOTE — Patient Instructions (Signed)
Great to see you again today- I will be in touch with your labs asap You got your flu and pneumonia shots today Consider getting the shingles vaccine at your pharmacy at your convenience- [facility]rix Will refer you to urology to do a semen analysis for you.  If all is ok, we will want to have a fertility eval for your wife as well. If you do have low sperm quality, there are techniques which may assist you and your wife with conception  Take care!

## 2019-06-23 ENCOUNTER — Other Ambulatory Visit: Payer: Self-pay | Admitting: Family Medicine

## 2019-06-23 DIAGNOSIS — E1140 Type 2 diabetes mellitus with diabetic neuropathy, unspecified: Secondary | ICD-10-CM

## 2019-06-23 DIAGNOSIS — R69 Illness, unspecified: Secondary | ICD-10-CM | POA: Diagnosis not present

## 2019-07-09 ENCOUNTER — Encounter: Payer: Self-pay | Admitting: Family Medicine

## 2019-07-10 MED ORDER — BD PEN NEEDLE MINI U/F 31G X 5 MM MISC: 1 refills | Status: DC

## 2019-07-21 DIAGNOSIS — Z0389 Encounter for observation for other suspected diseases and conditions ruled out: Secondary | ICD-10-CM | POA: Diagnosis not present

## 2019-07-24 ENCOUNTER — Encounter: Payer: Self-pay | Admitting: Family Medicine

## 2019-08-10 DIAGNOSIS — N468 Other male infertility: Secondary | ICD-10-CM | POA: Diagnosis not present

## 2019-08-10 DIAGNOSIS — R948 Abnormal results of function studies of other organs and systems: Secondary | ICD-10-CM | POA: Diagnosis not present

## 2019-08-10 DIAGNOSIS — Z3141 Encounter for fertility testing: Secondary | ICD-10-CM | POA: Diagnosis not present

## 2019-08-13 ENCOUNTER — Other Ambulatory Visit: Payer: Self-pay | Admitting: Family Medicine

## 2019-08-13 DIAGNOSIS — E1140 Type 2 diabetes mellitus with diabetic neuropathy, unspecified: Secondary | ICD-10-CM

## 2019-09-09 ENCOUNTER — Telehealth: Payer: Self-pay | Admitting: Family Medicine

## 2019-09-09 DIAGNOSIS — E1140 Type 2 diabetes mellitus with diabetic neuropathy, unspecified: Secondary | ICD-10-CM

## 2019-09-09 NOTE — Telephone Encounter (Signed)
Spoke with [name] today. He stated he received a call from his insurance stating that he needs  to contact the office to get his prescription refill for glipiZIDE 10 mg.

## 2019-09-10 MED ORDER — GLIPIZIDE 10 MG PO TABS: ORAL_TABLET | ORAL | 3 refills | Status: DC

## 2019-09-10 NOTE — Telephone Encounter (Signed)
Medication Refilled.

## 2019-09-19 DIAGNOSIS — R69 Illness, unspecified: Secondary | ICD-10-CM | POA: Diagnosis not present

## 2019-09-24 NOTE — Progress Notes (Addendum)
Virtual Visit via Video Note  I connected with patient on 09/25/19 at  1:45 PM EST by audio enabled telemedicine application and verified that I am speaking with the correct person using two identifiers.   THIS ENCOUNTER IS A VIRTUAL VISIT DUE TO COVID-19 - PATIENT WAS NOT SEEN IN THE OFFICE. PATIENT HAS CONSENTED TO VIRTUAL VISIT / TELEMEDICINE VISIT   Location of patient: home  Location of provider: office  I discussed the limitations of evaluation and management by telemedicine and the availability of in person appointments. The patient expressed understanding and agreed to proceed.   Subjective:   Timothy Chapman is a 74 y.o. male who presents for an Initial Medicare Annual Wellness Visit.  Review of Systems  Home Safety/Smoke Alarms: Feels safe in home. Smoke alarms in place.  Lives with wife in 1 story home.   Male:   CCS-07/07/18. Recall 5 yrs.     PSA-  Lab Results  Component Value Date   PSA 6.75 (H) 05/28/2019   PSA 6.57 (H) 07/09/2017   PSA 5.11 (H) 02/08/2016   [facility]   Objective:    Today's Vitals   09/25/19 1431 09/25/19 1433  BP: (!) 176/102 (!) 149/81   Pt reports BP ranging from 176/102 to 149/81 at home. Pt states he stopped taking his HCTZ a few months ago. Also states he just had 2 cups of coffee. Pt denies HA, chest pain, and SOB. Pt states he feels completely normal. Pt states he is willing to do a virtual visit, but is unable to come into office today due to lack of transportation. Appt scheduled for 30 min from now (3pm w/ [name]) and pt agrees if any emergency symptoms occur before that time, he will call 911.   There is no height or weight on file to calculate BMI.  Advanced Directives 09/25/2019 12/12/2017 10/02/2017  Does Patient Have a Medical Advance Directive? No No No  Would patient like information on creating a medical advance directive? No - Patient declined No - Patient declined No - Patient declined     Current Medications (verified) Outpatient Encounter Medications as of 09/25/2019  Medication Sig  . Alcohol Swabs (B-D SINGLE USE SWABS REGULAR) PADS USE PRIOR TO INSULIN DOSE  . amLODipine (NORVASC) 10 MG tablet Take 10 mg daily.  Marland Kitchen aspirin 81 MG chewable tablet Chew 1 tablet (81 mg total) by mouth daily.  . B-D UF III MINI PEN NEEDLES 31G X 5 MM MISC To use w/ Victoza  . benazepril (LOTENSIN) 40 MG tablet Take 1 tablet (40 mg total) by mouth daily.  . diclofenac (VOLTAREN) 75 MG EC tablet TAKE 1 TABLET TWICE A DAY  AS NEEDED FOR JOINT PAINS  . famotidine (PEPCID) 20 MG tablet Take 1 tablet (20 mg total) by mouth 2 (two) times daily.  Marland Kitchen glipiZIDE (GLUCOTROL) 10 MG tablet TAKE 1 TABLET TWICE A DAY  BEFORE A MEAL  . Lancets (ONETOUCH DELICA PLUS Q000111Q) MISC USE TWO TIMES A DAY TO     CHECK BLOOD SUGAR  . liraglutide (VICTOZA) 18 MG/3ML SOPN 1.2 ml  . Melatonin 3 MG TABS Take 1 tablet by mouth at bedtime.   . metFORMIN (GLUCOPHAGE) 1000 MG tablet TAKE 1 TABLET TWICE DAILY  WITH MEALS  . Multiple Vitamin (MULTIVITAMIN) tablet Take 1 tablet by mouth daily.  . niacin (NIASPAN) 500 MG CR tablet Take 1 tablet (500 mg total) by mouth at bedtime.  Marland Kitchen omeprazole (PRILOSEC)  40 MG capsule TAKE 1 CAPSULE DAILY  . ONETOUCH VERIO test strip USE AS INSTRUCTED  . tadalafil (CIALIS) 20 MG tablet Take 1 tablet (20 mg total) by mouth daily as needed for erectile dysfunction. Max 20 mg total per day  . tadalafil (CIALIS) 5 MG tablet Take 1 tablet (5 mg total) by mouth daily.  . Turmeric 450 MG CAPS Take 1 tablet by mouth daily.   . vitamin C (ASCORBIC ACID) 500 MG tablet Take 500 mg by mouth daily.  . Acetylcarnitine HCl 500 MG CAPS Take 500 mg by mouth daily.  . Alpha-Lipoic Acid 600 MG CAPS Take 600 mg by mouth daily.  . [DISCONTINUED] hydrochlorothiazide (HYDRODIURIL) 25 MG tablet Take 1 tablet (25 mg total) by mouth daily.   No facility-administered encounter medications on file as of 09/25/2019.     Allergies (verified) Codeine and Statins   History: Past Medical History:  Diagnosis Date  . AKI (acute kidney injury) ([facility])   . Anxiety   . Arthritis   . Barrett esophagus   . Depression   . Diabetes mellitus without complication ([place])    type 2  . GERD (gastroesophageal reflux disease)   . Hypertension   . Neuromuscular disorder ([facility])    neuropathy but being treated  . PONV (postoperative nausea and vomiting)   . Sleep apnea    has a sleep study but refuses that he has sleep study, not wearing CPAP   Past Surgical History:  Procedure Laterality Date  . BACK SURGERY     X2 - lower  . CHOLECYSTECTOMY N/A 12/20/2017   Procedure: LAPAROSCOPIC CHOLECYSTECTOMY WITH INTRAOPERATIVE CHOLANGIOGRAM POSSIBLE OPEN;  Surgeon: Fanny Skates, MD;  Location: [facility];  Service: General;  Laterality: N/A;  . COLONOSCOPY    . ESOPHAGOGASTRODUODENOSCOPY    . IR RADIOLOGIST EVAL & MGMT  10/30/2017  . IR RADIOLOGIST EVAL & MGMT  11/14/2017  . PROSTATE SURGERY  2017   uro lift per pt.  . REPLACEMENT TOTAL KNEE Left   . ROTATOR CUFF REPAIR     Bil   Family History  Problem Relation Age of Onset  . Heart failure Father   . Heart disease Father   . Emphysema Mother   . Colon cancer Maternal Grandmother    Social History   Socioeconomic History  . Marital status: Married    Spouse name: Not on file  . Number of children: Not on file  . Years of education: Not on file  . Highest education level: Not on file  Occupational History  . Not on file  Tobacco Use  . Smoking status: Former Smoker    Quit date: 09/18/1975    Years since quitting: 44.0  . Smokeless tobacco: Never Used  Substance and Sexual Activity  . Alcohol use: Yes    Alcohol/week: 0.0 standard drinks    Comment: Rarely  . Drug use: Never  . Sexual activity: Not on file  Other Topics Concern  . Not on file  Social History Narrative  . Not on file   Social Determinants of Health   Financial Resource Strain:   .  Difficulty of Paying Living Expenses: Not on file  Food Insecurity:   . Worried About Charity fundraiser in the Last Year: Not on file  . Ran Out of Food in the Last Year: Not on file  Transportation Needs:   . Lack of Transportation (Medical): Not on file  . Lack of Transportation (Non-Medical): Not on file  Physical Activity:   . Days of Exercise per Week: Not on file  . Minutes of Exercise per Session: Not on file  Stress:   . Feeling of Stress : Not on file  Social Connections:   . Frequency of Communication with Friends and Family: Not on file  . Frequency of Social Gatherings with Friends and Family: Not on file  . Attends Religious Services: Not on file  . Active Member of Clubs or Organizations: Not on file  . Attends Archivist Meetings: Not on file  . Marital Status: Not on file   Tobacco Counseling Counseling given: Not Answered   Clinical Intake: Pain : No/denies pain    Activities of Daily Living In your present state of health, do you have any difficulty performing the following activities: 09/25/2019  Hearing? N  Vision? N  Difficulty concentrating or making decisions? N  Walking or climbing stairs? N  Dressing or bathing? N  Doing errands, shopping? N  Preparing Food and eating ? N  Using the Toilet? N  In the past six months, have you accidently leaked urine? N  Do you have problems with loss of bowel control? N  Managing your Medications? N  Managing your Finances? N  Housekeeping or managing your Housekeeping? N  Some recent data might be hidden     Immunizations and Health Maintenance Immunization History  Administered Date(s) Administered  . Fluad Quad(high Dose 65+) 05/28/2019  . Influenza, High Dose Seasonal PF 06/20/2016, 07/08/2017  . Pneumococcal Conjugate-13 06/20/2016  . Pneumococcal Polysaccharide-23 05/28/2019   Health Maintenance Due  Topic Date Due  . TETANUS/TDAP  07/29/1967  . OPHTHALMOLOGY EXAM  07/02/2019     Patient Care Team: Copland, Gay Filler, MD as PCP - General (Family Medicine)  Indicate any recent Medical Services you may have received from other than [facility] providers in the past year (date may be approximate).    Assessment:   This is a routine wellness examination for [facility]. Physical assessment deferred to PCP.  Hearing/Vision screen Unable to assess. This visit is enabled though telemedicine due to Covid 19.   Dietary issues and exercise activities discussed: Current Exercise Habits: Home exercise routine, Type of exercise: strength training/weights;walking;calisthenics, Time (Minutes): 60, Frequency (Times/Week): 3, Weekly Exercise (Minutes/Week): 180, Intensity: Mild, Exercise limited by: None identified Diet (meal preparation, eat out, water intake, caffeinated beverages, dairy products, fruits and vegetables): well balanced   Goals    . DIET - EAT MORE FRUITS AND VEGETABLES      Depression Screen PHQ 2/9 Scores 09/25/2019 07/08/2017 06/20/2016  PHQ - 2 Score 0 0 0  Exception Documentation - Patient refusal -    Fall Risk Fall Risk  09/25/2019 07/08/2017 06/20/2016  Falls in the past year? 1 Yes No  Number falls in past yr: 1 2 or more -  Injury with Fall? 0 No -  Risk for fall due to : - Impaired balance/gait -  Follow up Education provided;Falls prevention discussed Falls prevention discussed -     Cognitive Function: Ad8 score reviewed for issues:  Issues making decisions:no  Less interest in hobbies / activities:no  Repeats questions, stories (family complaining):no  Trouble using ordinary gadgets (microwave, computer, phone):no  Forgets the month or year: no  Mismanaging finances: no  Remembering appts:no  Daily problems with thinking and/or memory:no Ad8 score is=0         Screening Tests Health Maintenance  Topic Date Due  . TETANUS/TDAP  07/29/1967  . OPHTHALMOLOGY  EXAM  07/02/2019  . FOOT EXAM  10/09/2019  . HEMOGLOBIN A1C  11/25/2019   . COLONOSCOPY  07/07/2028  . INFLUENZA VACCINE  Completed  . Hepatitis C Screening  Completed  . PNA vac Low Risk Adult  Completed        Plan:    Please schedule your next medicare wellness visit with me in 1 yr.  Continue to eat heart healthy diet (full of fruits, vegetables, whole grains, lean protein, water--limit salt, fat, and sugar intake) and increase physical activity as tolerated.  Continue doing brain stimulating activities (puzzles, reading, adult coloring books, staying active) to keep memory sharp.   Bring a copy of your living will and/or healthcare power of attorney to your next office visit.  Pt reports BP ranging from 176/102 to 149/81 at home. Pt states he stopped taking his HCTZ a few months ago. Also states he just had 2 cups of coffee. Pt denies HA, chest pain, and SOB. Pt states he feels completely normal. Pt states he is willing to do a virtual visit, but is unable to come into office today due to lack of transportation. Appt scheduled for 30 min from now (3pm w/ [name]) and pt agrees if any emergency symptoms occur before that time, he will call 911.    I have personally reviewed and noted the following in the patient's chart:   . Medical and social history . Use of alcohol, tobacco or illicit drugs  . Current medications and supplements . Functional ability and status . Nutritional status . Physical activity . Advanced directives . List of other physicians . Hospitalizations, surgeries, and ER visits in previous 12 months . Vitals . Screenings to include cognitive, depression, and falls . Referrals and appointments  In addition, I have reviewed and discussed with patient certain preventive protocols, quality metrics, and best practice recommendations. A written personalized care plan for preventive services as well as general preventive health recommendations were provided to patient.     Shela Nevin, [place]   09/25/2019    Reviewed &  Agree with assessment and plan of RN.  [name], PA-C

## 2019-09-25 ENCOUNTER — Ambulatory Visit (INDEPENDENT_AMBULATORY_CARE_PROVIDER_SITE_OTHER): Payer: Medicare HMO | Admitting: *Deleted

## 2019-09-25 ENCOUNTER — Other Ambulatory Visit: Payer: Self-pay

## 2019-09-25 ENCOUNTER — Ambulatory Visit (INDEPENDENT_AMBULATORY_CARE_PROVIDER_SITE_OTHER): Payer: Medicare HMO | Admitting: Medical

## 2019-09-25 ENCOUNTER — Encounter: Payer: Self-pay | Admitting: Medical

## 2019-09-25 ENCOUNTER — Encounter: Payer: Self-pay | Admitting: *Deleted

## 2019-09-25 VITALS — BP 149/81

## 2019-09-25 VITALS — BP 176/99 | HR 74 | Temp 97.6°F | Ht 69.0 in | Wt 251.0 lb

## 2019-09-25 DIAGNOSIS — I10 Essential (primary) hypertension: Secondary | ICD-10-CM

## 2019-09-25 DIAGNOSIS — E785 Hyperlipidemia, unspecified: Secondary | ICD-10-CM | POA: Diagnosis not present

## 2019-09-25 DIAGNOSIS — E1140 Type 2 diabetes mellitus with diabetic neuropathy, unspecified: Secondary | ICD-10-CM | POA: Diagnosis not present

## 2019-09-25 DIAGNOSIS — Z0000 Encounter for general adult medical examination without abnormal findings: Secondary | ICD-10-CM

## 2019-09-25 MED ORDER — CHLORTHALIDONE 25 MG PO TABS: 25.0000 mg | ORAL_TABLET | Freq: Every day | ORAL | 0 refills | Status: DC

## 2019-09-25 NOTE — Progress Notes (Signed)
   Subjective:    Patient ID: Timothy Chapman, male    DOB: [birth year], 74 y.o.   MRN: [id number]  HPI  Virtual Visit via Video Note  I connected with Timothy Chapman on 09/25/19 at  3:00 PM EST by a video enabled telemedicine application and verified that I am speaking with the correct person using two identifiers.  Location: Patient: home Provider: office   I discussed the limitations of evaluation and management by telemedicine and the availability of in person appointments. The patient expressed understanding and agreed to proceed.  History of Present Illness:  Pt has high bp today.  Pt bp quite high and he is still on his meds.  While pt on phone noticed he had what appeared to be thick sweater so asked him to roll up sleeve or take shirt off to check.   Pt bp high when checked cuff directly on skin.   No cardiac or neurologic signs.  Pt machine is couple of years old.  Back in march very high bp. Metoprolol. Made him feel like zombi.  Pt sugars was 165 this am.         Observations/Objective: General-no acute distress, pleasant, oriented. Lungs- on inspection lungs appear unlabored. Neck- no tracheal deviation or jvd on inspection. Neuro- gross motor function appears intact.  Assessment and Plan: Add chlorthalidone 25 mg q day. Benefits of med explained. Side effects explained. Explained limited number of classes avaiable. He did not do well on b-blocker in march.  Eat k rich diet.  Low sugar diet continue diabetic meds.  If with bp increase any cardio or neurologic type signs/symptoms over weekend then ED evaluation.  Future a1c, lipid and cmp. Call on Monday for appointment.  My chart bp update on Monday. Then decide when follow up indicated.  Follow Up Instructions:    I discussed the assessment and treatment plan with the patient. The patient was provided an opportunity to ask questions and all were answered. The patient agreed with the plan and  demonstrated an understanding of the instructions.   The patient was advised to call back or seek an in-person evaluation if the symptoms worsen or if the condition fails to improve as anticipated.  I provided 30  minutes of non-face-to-face time during this encounter.   [name], PA-C   Review of Systems  Constitutional: Negative for chills, fatigue and fever.  Respiratory: Negative for cough, chest tightness, shortness of breath and wheezing.   Cardiovascular: Negative for chest pain and palpitations.  Gastrointestinal: Negative for abdominal pain.  Neurological: Negative for dizziness, tremors, syncope, weakness and headaches.  Hematological: Negative for adenopathy. Does not bruise/bleed easily.  Psychiatric/Behavioral: Negative for agitation.       Objective:   Physical Exam        Assessment & Plan:

## 2019-09-25 NOTE — Patient Instructions (Signed)
Add chlorthalidone 25 mg q day. Benefits of med explained. Side effects explained. Explained limited number of classes avaiable. He did not do well on b-blocker in march.  Eat k rich diet.  Low sugar diet continue diabetic meds.  If with bp increase any cardio or neurologic type signs/symptoms over weekend then ED evaluation.  Future a1c, lipid and cmp. Call on Monday for appointment.  My chart bp update on Monday. Then decide when follow up indicated.

## 2019-09-25 NOTE — Patient Instructions (Addendum)
Please schedule your next medicare wellness visit with me in 1 yr.  Continue to eat heart healthy diet (full of fruits, vegetables, whole grains, lean protein, water--limit salt, fat, and sugar intake) and increase physical activity as tolerated.  Continue doing brain stimulating activities (puzzles, reading, adult coloring books, staying active) to keep memory sharp.   Bring a copy of your living will and/or healthcare power of attorney to your next office visit.   [name] , Thank you for taking time to come for your Medicare Wellness Visit. I appreciate your ongoing commitment to your health goals. Please review the following plan we discussed and let me know if I can assist you in the future.   These are the goals we discussed: Goals    . DIET - EAT MORE FRUITS AND VEGETABLES       This is a list of the screening recommended for you and due dates:  Health Maintenance  Topic Date Due  . Tetanus Vaccine  07/29/1967  . Eye exam for diabetics  07/02/2019  . Complete foot exam   10/09/2019  . Hemoglobin A1C  11/25/2019  . Colon Cancer Screening  07/07/2028  . Flu Shot  Completed  .  Hepatitis C: One time screening is recommended by Center for Disease Control  (CDC) for  adults born from [birth year] through 1965.   Completed  . Pneumonia vaccines  Completed    Hypertension, Adult Hypertension is another name for high blood pressure. High blood pressure forces your heart to work harder to pump blood. This can cause problems over time. There are two numbers in a blood pressure reading. There is a top number (systolic) over a bottom number (diastolic). It is best to have a blood pressure that is below 120/80. Healthy choices can help lower your blood pressure, or you may need medicine to help lower it. What are the causes? The cause of this condition is not known. Some conditions may be related to high blood pressure. What increases the risk?  Smoking.  Having type 2 diabetes mellitus,  high cholesterol, or both.  Not getting enough exercise or physical activity.  Being overweight.  Having too much fat, sugar, calories, or salt (sodium) in your diet.  Drinking too much alcohol.  Having long-term (chronic) kidney disease.  Having a family history of high blood pressure.  Age. Risk increases with age.  Race. You may be at higher risk if you are African American.  Gender. Men are at higher risk than women before age 27. After age 7, women are at higher risk than men.  Having obstructive sleep apnea.  Stress. What are the signs or symptoms?  High blood pressure may not cause symptoms. Very high blood pressure (hypertensive crisis) may cause: ? Headache. ? Feelings of worry or nervousness (anxiety). ? Shortness of breath. ? Nosebleed. ? A feeling of being sick to your stomach (nausea). ? Throwing up (vomiting). ? Changes in how you see. ? Very bad chest pain. ? Seizures. How is this treated?  This condition is treated by making healthy lifestyle changes, such as: ? Eating healthy foods. ? Exercising more. ? Drinking less alcohol.  Your health care provider may prescribe medicine if lifestyle changes are not enough to get your blood pressure under control, and if: ? Your top number is above 130. ? Your bottom number is above 80.  Your personal target blood pressure may vary. Follow these instructions at home: Eating and drinking   If told, follow  the DASH eating plan. To follow this plan: ? Fill one half of your plate at each meal with fruits and vegetables. ? Fill one fourth of your plate at each meal with whole grains. Whole grains include whole-wheat pasta, brown rice, and whole-grain bread. ? Eat or drink low-fat dairy products, such as skim milk or low-fat yogurt. ? Fill one fourth of your plate at each meal with low-fat (lean) proteins. Low-fat proteins include fish, chicken without skin, eggs, beans, and tofu. ? Avoid fatty meat, cured and  processed meat, or chicken with skin. ? Avoid pre-made or processed food.  Eat less than 1,500 mg of salt each day.  Do not drink alcohol if: ? Your doctor tells you not to drink. ? You are pregnant, may be pregnant, or are planning to become pregnant.  If you drink alcohol: ? Limit how much you use to:  0-1 drink a day for women.  0-2 drinks a day for men. ? Be aware of how much alcohol is in your drink. In the [place] one drink equals one 12 oz bottle of beer (355 mL), one 5 oz glass of wine (148 mL), or one 1 oz glass of hard liquor (44 mL). Lifestyle   Work with your doctor to stay at a healthy weight or to lose weight. Ask your doctor what the best weight is for you.  Get at least 30 minutes of exercise most days of the week. This may include walking, swimming, or biking.  Get at least 30 minutes of exercise that strengthens your muscles (resistance exercise) at least 3 days a week. This may include lifting weights or doing Pilates.  Do not use any products that contain nicotine or tobacco, such as cigarettes, e-cigarettes, and chewing tobacco. If you need help quitting, ask your doctor.  Check your blood pressure at home as told by your doctor.  Keep all follow-up visits as told by your doctor. This is important. Medicines  Take over-the-counter and prescription medicines only as told by your doctor. Follow directions carefully.  Do not skip doses of blood pressure medicine. The medicine does not work as well if you skip doses. Skipping doses also puts you at risk for problems.  Ask your doctor about side effects or reactions to medicines that you should watch for. Contact a doctor if you:  Think you are having a reaction to the medicine you are taking.  Have headaches that keep coming back (recurring).  Feel dizzy.  Have swelling in your ankles.  Have trouble with your vision. Get help right away if you:  Get a very bad headache.  Start to feel mixed up  (confused).  Feel weak or numb.  Feel faint.  Have very bad pain in your: ? Chest. ? Belly (abdomen).  Throw up more than once.  Have trouble breathing. Summary  Hypertension is another name for high blood pressure.  High blood pressure forces your heart to work harder to pump blood.  For most people, a normal blood pressure is less than 120/80.  Making healthy choices can help lower blood pressure. If your blood pressure does not get lower with healthy choices, you may need to take medicine. This information is not intended to replace advice given to you by your health care provider. Make sure you discuss any questions you have with your health care provider. Document Revised: 05/14/2018 Document Reviewed: 05/14/2018 Elsevier Patient Education  [place] 65 Years and Older, Male Preventive care  refers to lifestyle choices and visits with your health care provider that can promote health and wellness. This includes:  A yearly physical exam. This is also called an annual well check.  Regular dental and eye exams.  Immunizations.  Screening for certain conditions.  Healthy lifestyle choices, such as diet and exercise. What can I expect for my preventive care visit? Physical exam Your health care provider will check:  Height and weight. These may be used to calculate body mass index (BMI), which is a measurement that tells if you are at a healthy weight.  Heart rate and blood pressure.  Your skin for abnormal spots. Counseling Your health care provider may ask you questions about:  Alcohol, tobacco, and drug use.  Emotional well-being.  Home and relationship well-being.  Sexual activity.  Eating habits.  History of falls.  Memory and ability to understand (cognition).  Work and work Statistician. What immunizations do I need?  Influenza (flu) vaccine  This is recommended every year. Tetanus, diphtheria, and pertussis (Tdap)  vaccine  You may need a Td booster every 10 years. Varicella (chickenpox) vaccine  You may need this vaccine if you have not already been vaccinated. Zoster (shingles) vaccine  You may need this after age 16. Pneumococcal conjugate (PCV13) vaccine  One dose is recommended after age 63. Pneumococcal polysaccharide (PPSV23) vaccine  One dose is recommended after age 57. Measles, mumps, and rubella (MMR) vaccine  You may need at least one dose of MMR if you were born in 1957 or later. You may also need a second dose. Meningococcal conjugate (MenACWY) vaccine  You may need this if you have certain conditions. Hepatitis A vaccine  You may need this if you have certain conditions or if you travel or work in places where you may be exposed to hepatitis A. Hepatitis B vaccine  You may need this if you have certain conditions or if you travel or work in places where you may be exposed to hepatitis B. Haemophilus influenzae type b (Hib) vaccine  You may need this if you have certain conditions. You may receive vaccines as individual doses or as more than one vaccine together in one shot (combination vaccines). Talk with your health care provider about the risks and benefits of combination vaccines. What tests do I need? Blood tests  Lipid and cholesterol levels. These may be checked every 5 years, or more frequently depending on your overall health.  Hepatitis C test.  Hepatitis B test. Screening  Lung cancer screening. You may have this screening every year starting at age 7 if you have a 30-pack-year history of smoking and currently smoke or have quit within the past 15 years.  Colorectal cancer screening. All adults should have this screening starting at age 9 and continuing until age 70. Your health care provider may recommend screening at age 58 if you are at increased risk. You will have tests every 1-10 years, depending on your results and the type of screening  test.  Prostate cancer screening. Recommendations will vary depending on your family history and other risks.  Diabetes screening. This is done by checking your blood sugar (glucose) after you have not eaten for a while (fasting). You may have this done every 1-3 years.  Abdominal aortic aneurysm (AAA) screening. You may need this if you are a current or former smoker.  Sexually transmitted disease (STD) testing. Follow these instructions at home: Eating and drinking  Eat a diet that includes fresh fruits and vegetables,  whole grains, lean protein, and low-fat dairy products. Limit your intake of foods with high amounts of sugar, saturated fats, and salt.  Take vitamin and mineral supplements as recommended by your health care provider.  Do not drink alcohol if your health care provider tells you not to drink.  If you drink alcohol: ? Limit how much you have to 0-2 drinks a day. ? Be aware of how much alcohol is in your drink. In the [place] one drink equals one 12 oz bottle of beer (355 mL), one 5 oz glass of wine (148 mL), or one 1 oz glass of hard liquor (44 mL). Lifestyle  Take daily care of your teeth and gums.  Stay active. Exercise for at least 30 minutes on 5 or more days each week.  Do not use any products that contain nicotine or tobacco, such as cigarettes, e-cigarettes, and chewing tobacco. If you need help quitting, ask your health care provider.  If you are sexually active, practice safe sex. Use a condom or other form of protection to prevent STIs (sexually transmitted infections).  Talk with your health care provider about taking a low-dose aspirin or statin. What's next?  Visit your health care provider once a year for a well check visit.  Ask your health care provider how often you should have your eyes and teeth checked.  Stay up to date on all vaccines. This information is not intended to replace advice given to you by your health care provider. Make sure you  discuss any questions you have with your health care provider. Document Revised: 08/28/2018 Document Reviewed: 08/28/2018 Elsevier Patient Education  2020 [employer].

## 2019-10-06 ENCOUNTER — Encounter: Payer: Self-pay | Admitting: Family Medicine

## 2019-10-10 IMAGING — US US RENAL
1 series · 14 of 25 positions shown · non-contrast
Comparison: CT abdomen and pelvis October 04, 2017

CLINICAL DATA: Renal failure

EXAM:
RENAL ULTRASOUND

[Series 1: us renal · 0.28mm/px · 14 of 56 slices shown]
[im 1/56]
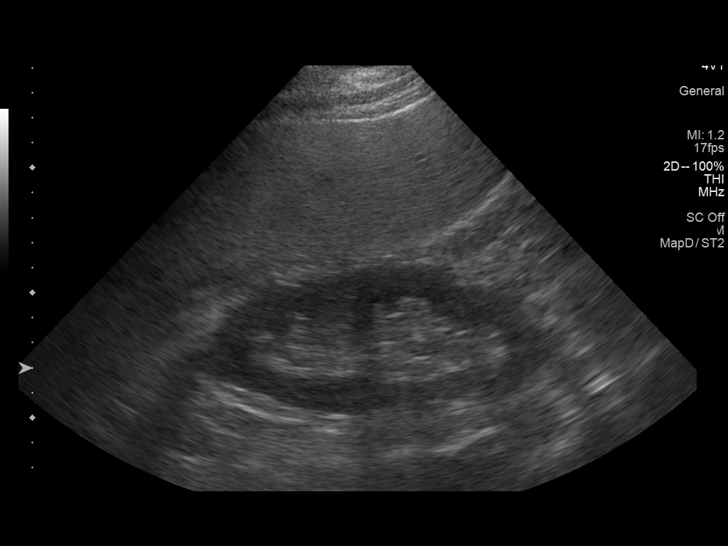
[im 5/56]
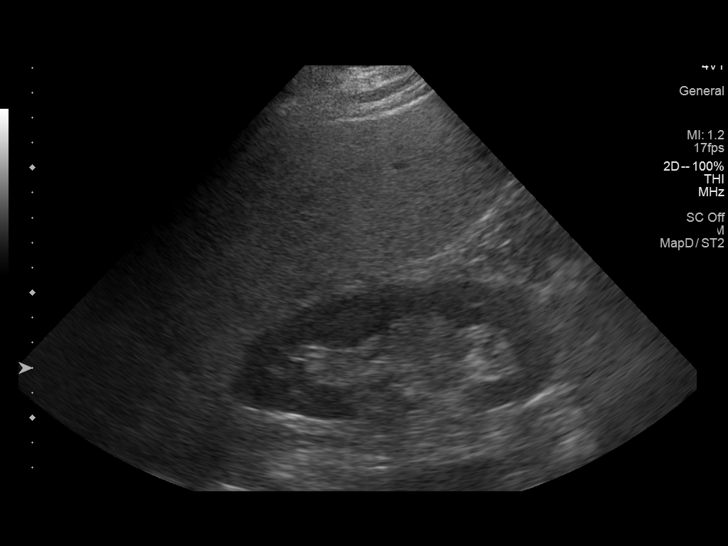
[im 10/56]
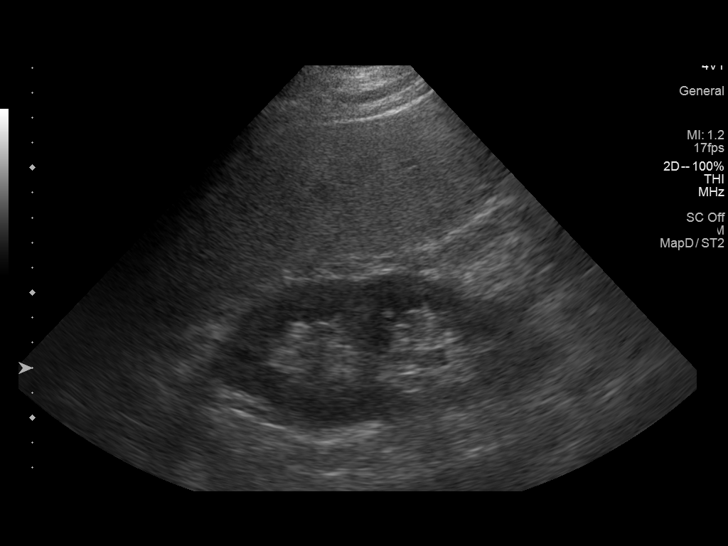
[im 14/56]
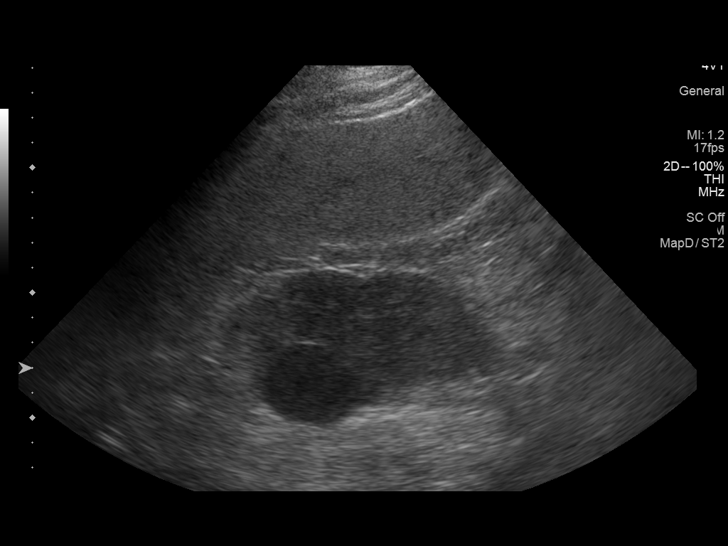
[im 19/56]
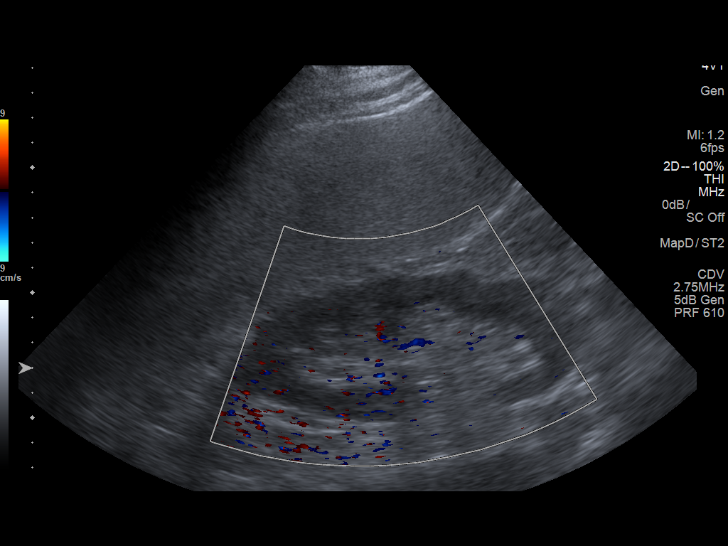
[im 21/56]
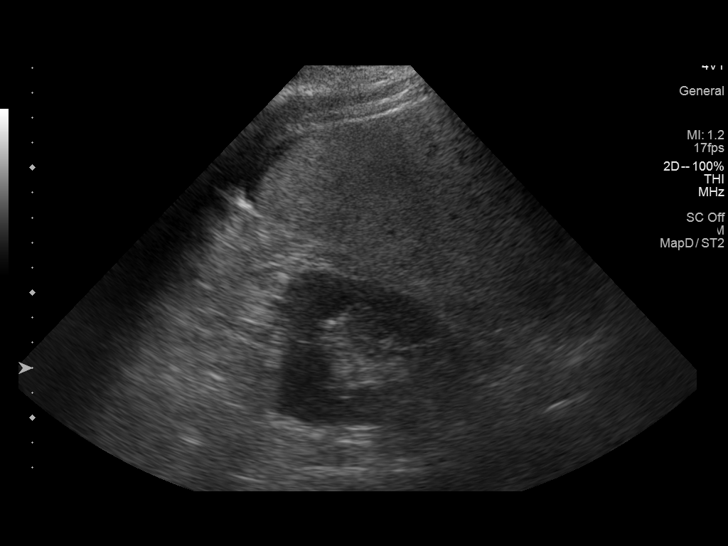
[im 26/56]
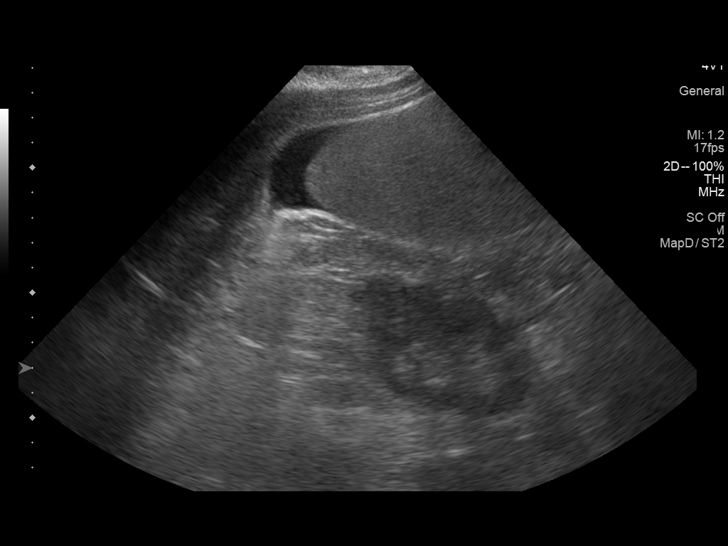
[im 30/56]
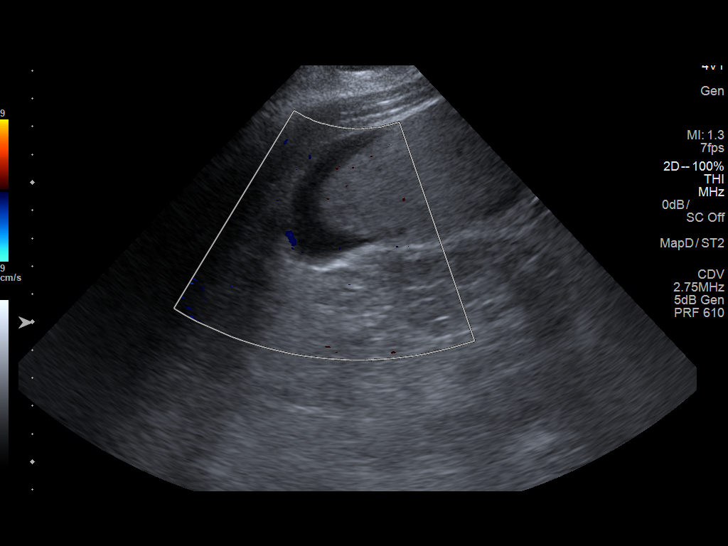
[im 35/56]
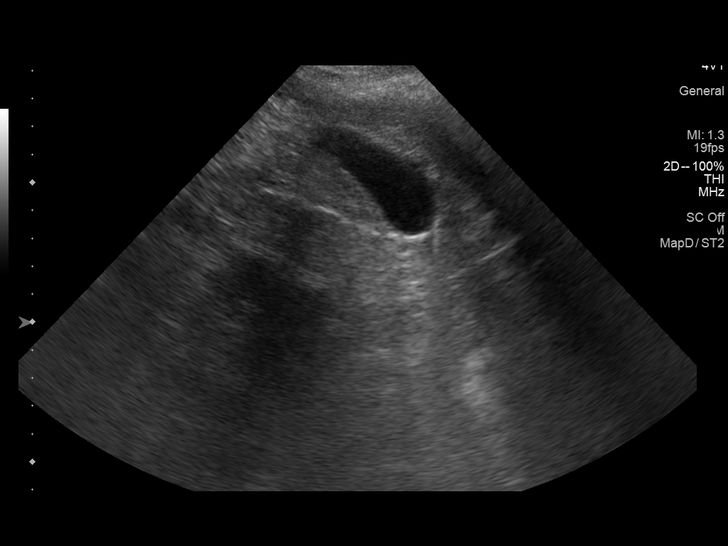
[im 37/56]
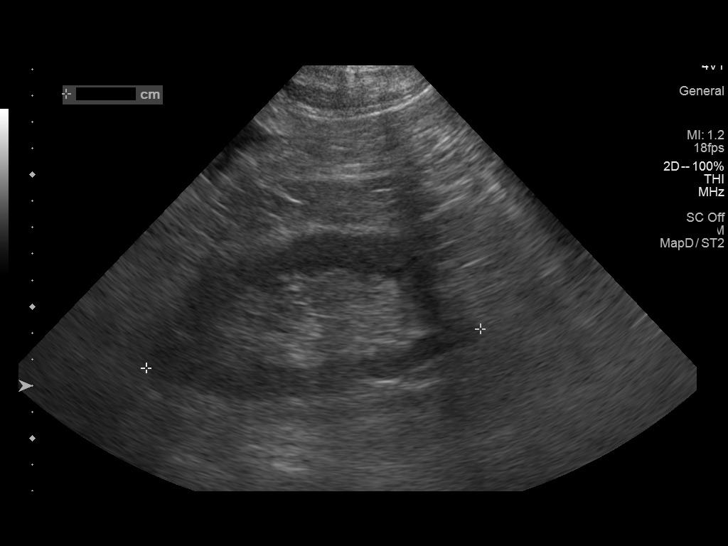
[im 42/56]
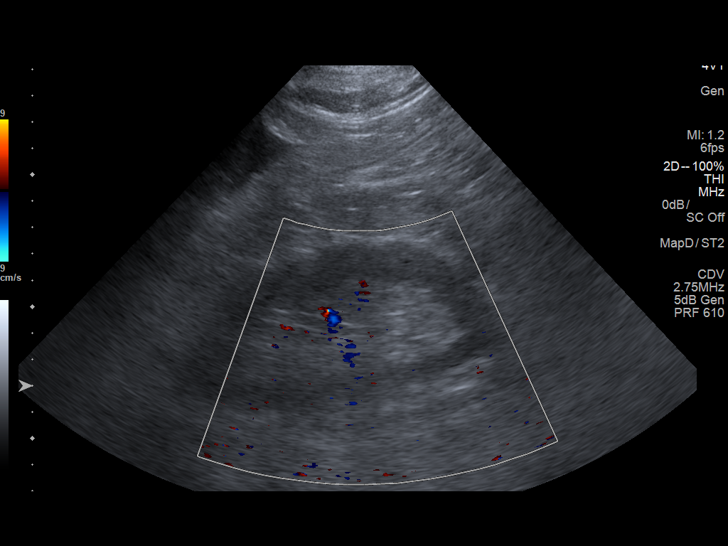
[im 46/56]
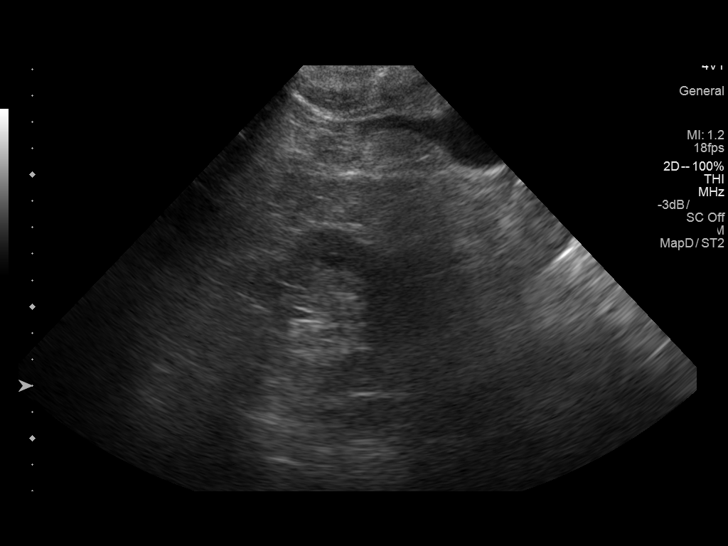
[im 51/56]
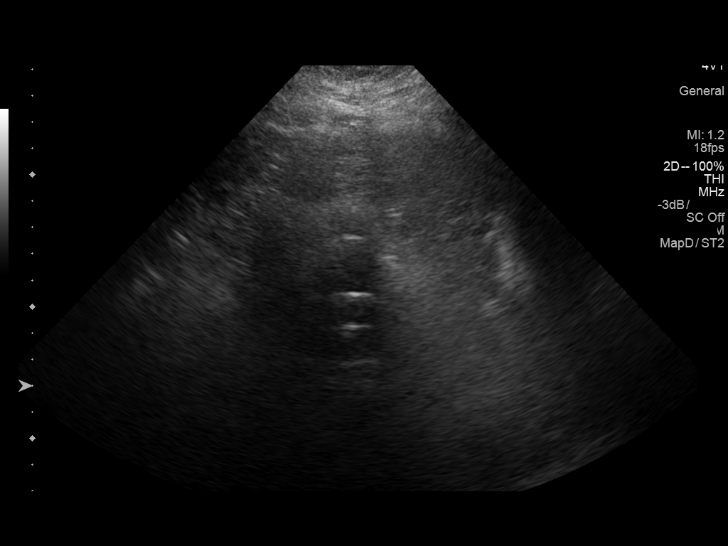
[im 56/56]
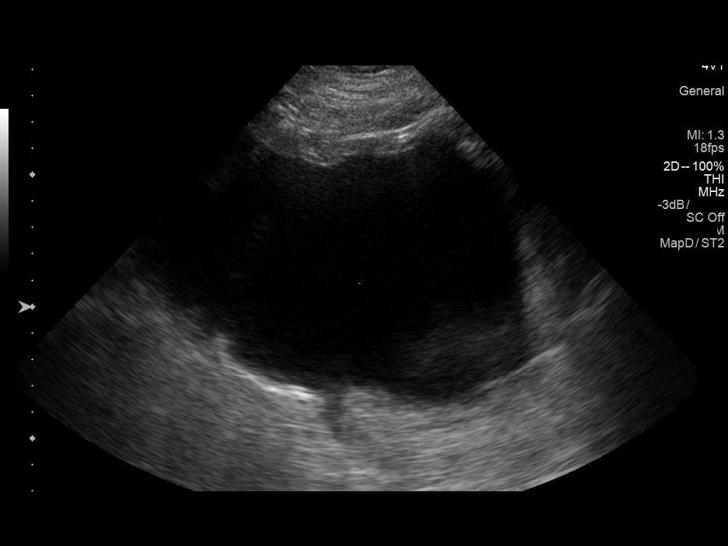

[14 of 25 positions shown; findings below may reference images not displayed]

FINDINGS: Right Kidney:

Length: 13.5 cm. Echogenicity and renal cortical thickness are
within normal limits. No perinephric fluid or hydronephrosis
visualized. There is a cyst in the mid right kidney measuring 3.9 x
3.8 x 3.0 cm. No sonographically demonstrable calculus or
ureterectasis.

Left Kidney:

Length: 12.8 cm. Echogenicity and renal cortical thickness are
within normal limits. No mass, perinephric fluid, or hydronephrosis
visualized. No sonographically demonstrable calculus or
ureterectasis.

Bladder:

Decompressed with Foley catheter and cannot be assessed.

Moderate ascites noted.
IMPRESSION: Cyst arising from the mid right kidney. Kidneys bilaterally
otherwise appear unremarkable. There is moderate ascites.

## 2019-10-12 IMAGING — US US ABDOMEN LIMITED
1 series · 14 of 25 positions shown · non-contrast
Comparison: Abdomen films of 10/15/2017 and CT abdomen of
10/04/2017

CLINICAL DATA: Evaluate drain in gallbladder from cholecystostomy

EXAM:
ULTRASOUND ABDOMEN LIMITED RIGHT UPPER QUADRANT

[Series 1: us abdomen limited · 0.26mm/px · 14 of 47 slices shown]
[im 1/47]
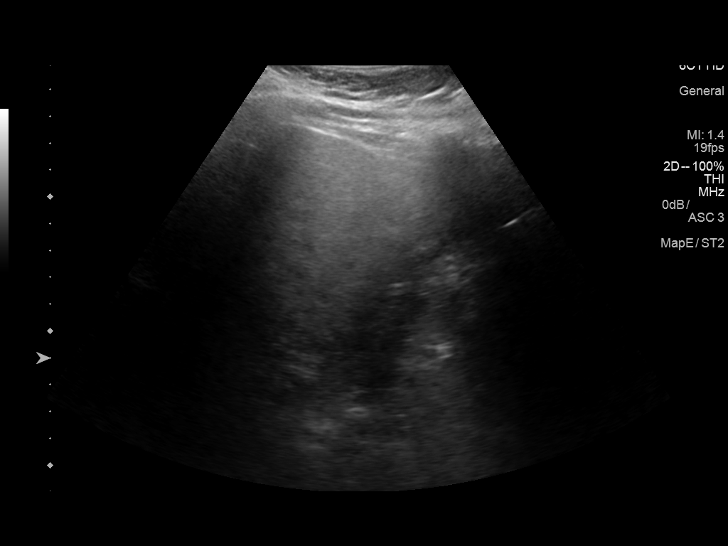
[im 4/47]
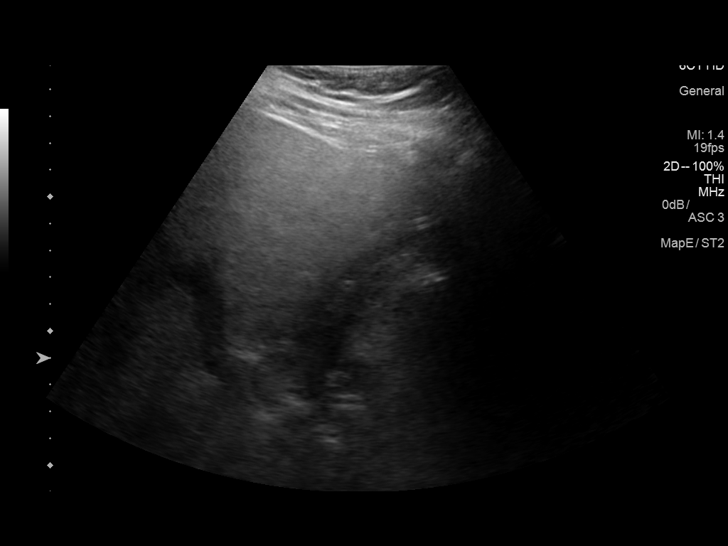
[im 8/47]
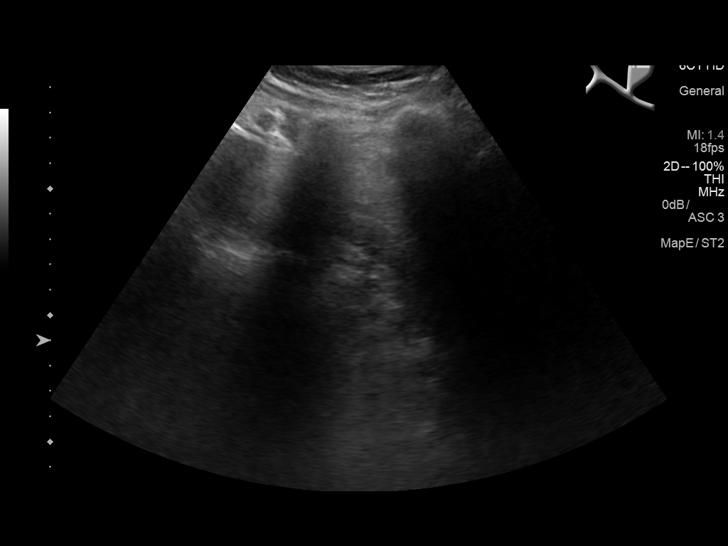
[im 12/47]
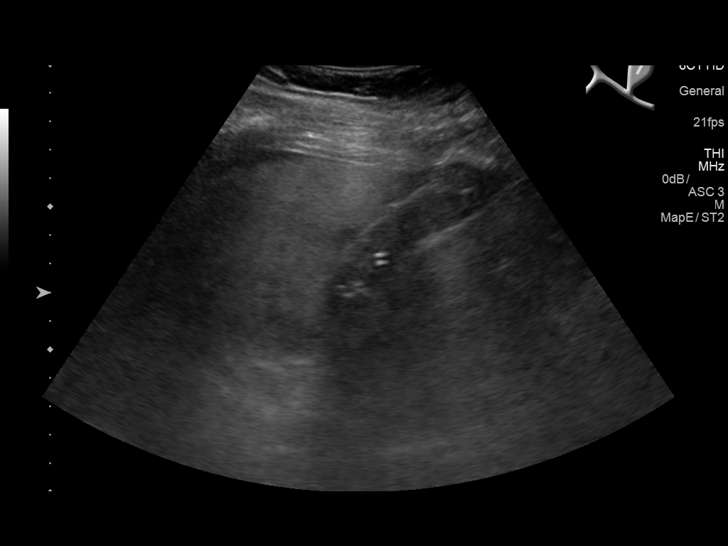
[im 16/47]
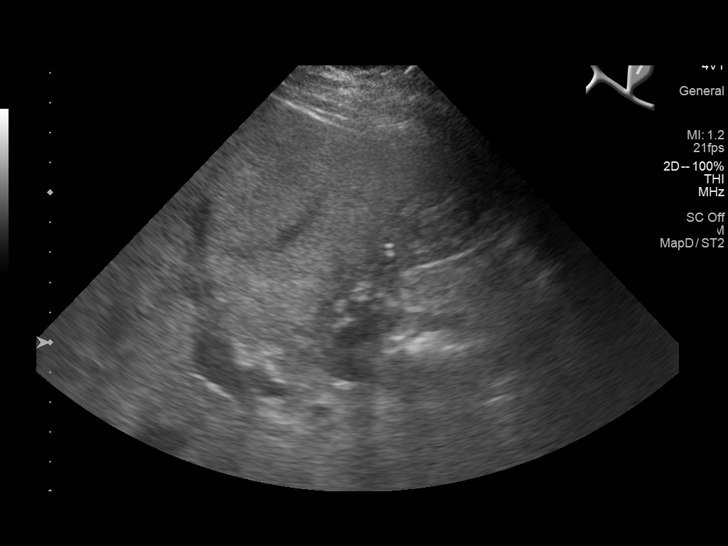
[im 18/47]
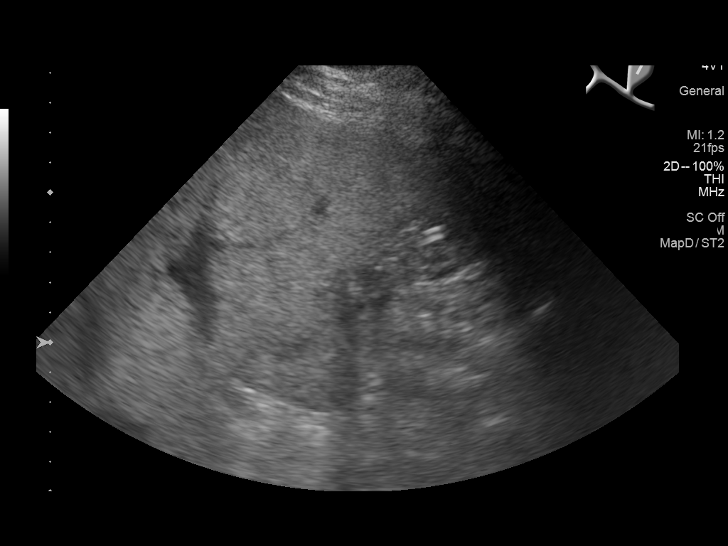
[im 22/47]
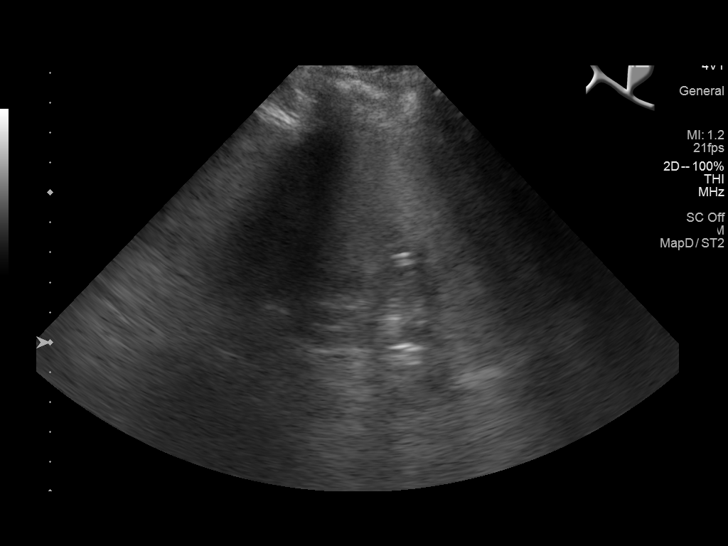
[im 25/47]
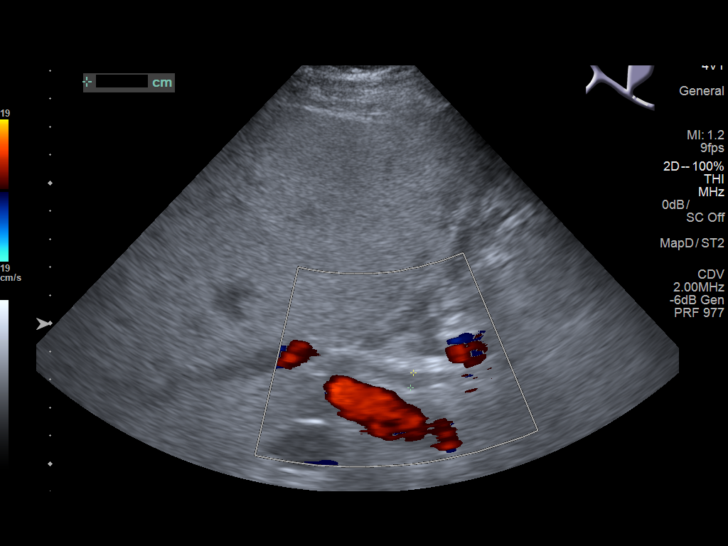
[im 29/47]
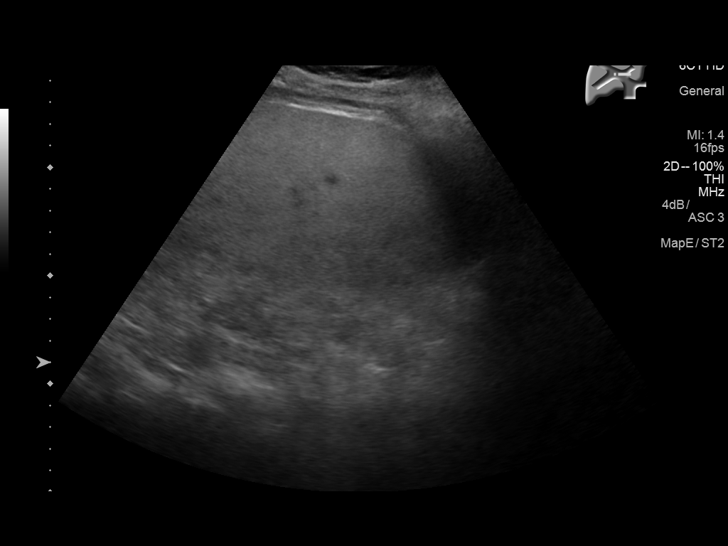
[im 31/47]
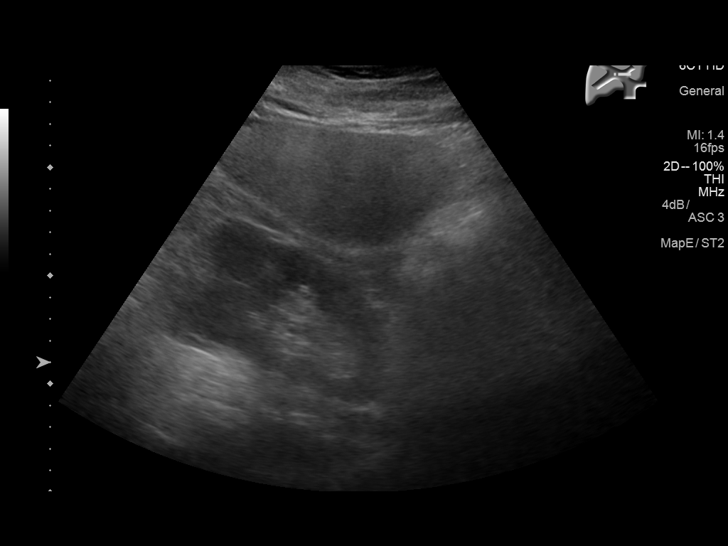
[im 35/47]
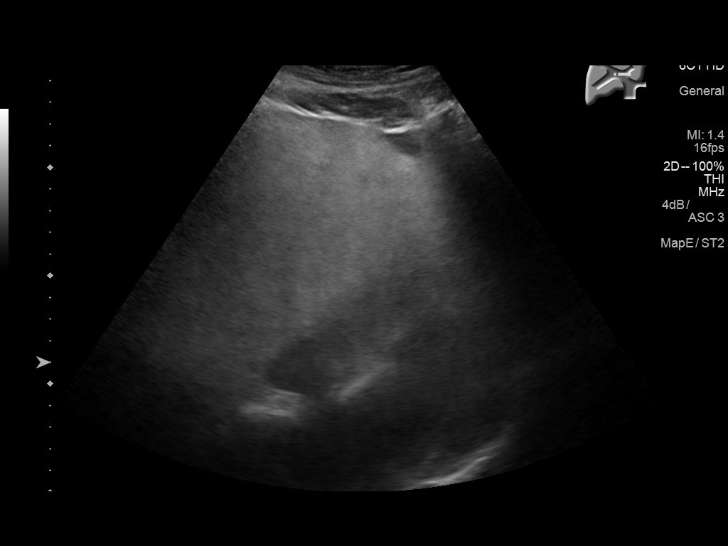
[im 39/47]
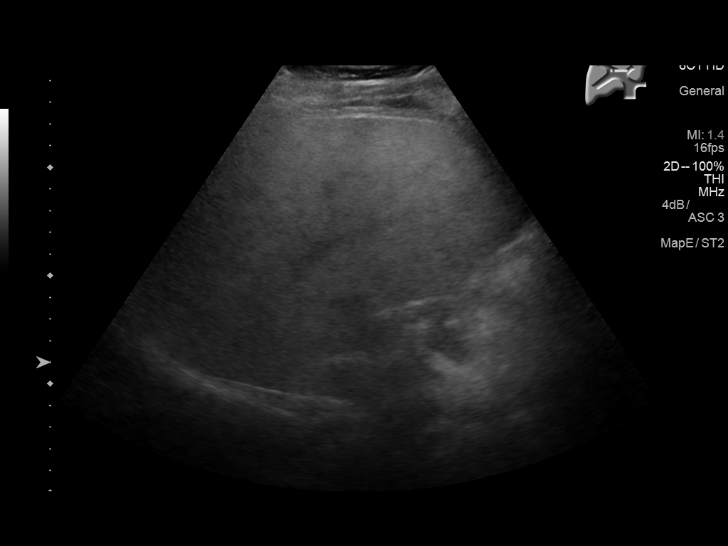
[im 43/47]
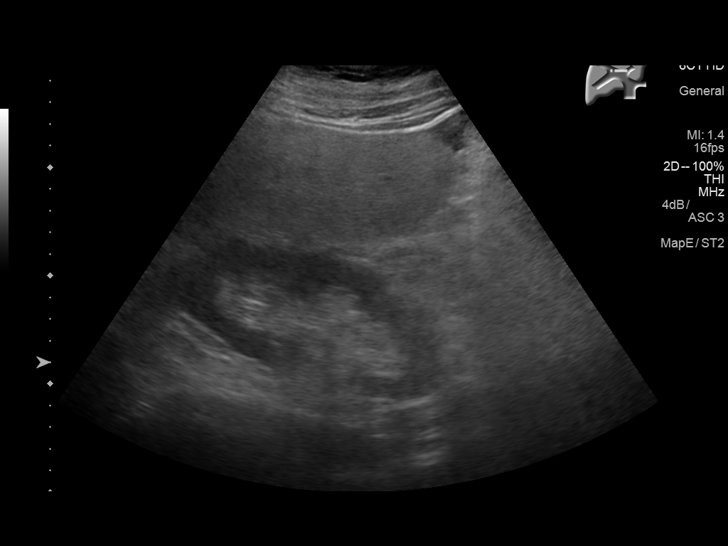
[im 47/47]
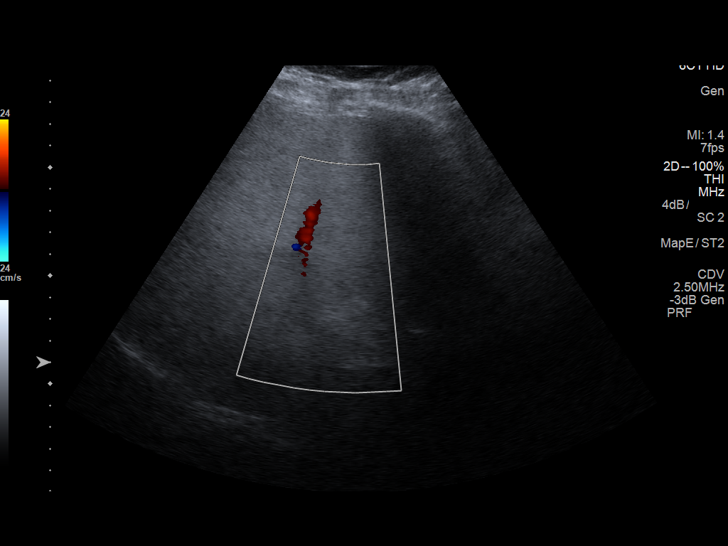

[14 of 25 positions shown; findings below may reference images not displayed]

FINDINGS: Gallbladder:

The gallbladder is decompressed and echodensity within the lumen is
consistent with cholecystostomy tube.

Common bile duct:

Diameter: The common bile duct is normal measuring 5.4 mm.

Liver:

The liver is echogenic diffusely consistent with fatty infiltration.
No focal hepatic abnormality is seen. Portal vein is patent on color
Doppler imaging with normal direction of blood flow towards the
liver.
IMPRESSION: 1. Linear echodensity within the gallbladder is consistent with
cholecystostomy tube with decompression of the gallbladder.
2. Echogenic liver parenchyma consistent with diffuse fatty
infiltration. No focal abnormality.

## 2019-10-29 DIAGNOSIS — G4700 Insomnia, unspecified: Secondary | ICD-10-CM | POA: Diagnosis not present

## 2019-10-29 DIAGNOSIS — G8929 Other chronic pain: Secondary | ICD-10-CM | POA: Diagnosis not present

## 2019-10-29 DIAGNOSIS — K5900 Constipation, unspecified: Secondary | ICD-10-CM | POA: Diagnosis not present

## 2019-10-29 DIAGNOSIS — K219 Gastro-esophageal reflux disease without esophagitis: Secondary | ICD-10-CM | POA: Diagnosis not present

## 2019-10-29 DIAGNOSIS — I10 Essential (primary) hypertension: Secondary | ICD-10-CM | POA: Diagnosis not present

## 2019-10-29 DIAGNOSIS — M1990 Unspecified osteoarthritis, unspecified site: Secondary | ICD-10-CM | POA: Diagnosis not present

## 2019-10-29 DIAGNOSIS — E669 Obesity, unspecified: Secondary | ICD-10-CM | POA: Diagnosis not present

## 2019-10-29 DIAGNOSIS — H547 Unspecified visual loss: Secondary | ICD-10-CM | POA: Diagnosis not present

## 2019-10-29 DIAGNOSIS — E119 Type 2 diabetes mellitus without complications: Secondary | ICD-10-CM | POA: Diagnosis not present

## 2019-10-29 DIAGNOSIS — R69 Illness, unspecified: Secondary | ICD-10-CM | POA: Diagnosis not present

## 2019-11-11 ENCOUNTER — Other Ambulatory Visit: Payer: Self-pay | Admitting: Family Medicine

## 2019-11-11 DIAGNOSIS — R69 Illness, unspecified: Secondary | ICD-10-CM | POA: Diagnosis not present

## 2019-11-11 DIAGNOSIS — E1140 Type 2 diabetes mellitus with diabetic neuropathy, unspecified: Secondary | ICD-10-CM

## 2019-11-11 DIAGNOSIS — I10 Essential (primary) hypertension: Secondary | ICD-10-CM

## 2019-11-15 ENCOUNTER — Ambulatory Visit: Payer: Medicare HMO | Attending: Internal Medicine

## 2019-11-15 DIAGNOSIS — Z23 Encounter for immunization: Secondary | ICD-10-CM | POA: Insufficient documentation

## 2019-11-15 NOTE — Progress Notes (Signed)
   Covid-19 Vaccination Clinic  Name:  Timothy Chapman    MRN: [id number] DOB: Nov 12, 1945  11/15/2019  [name] was observed post Covid-19 immunization for 15 minutes without incidence. He was provided with Vaccine Information Sheet and instruction to access the V-Safe system.   [name] was instructed to call 911 with any severe reactions post vaccine: Marland Kitchen Difficulty breathing  . Swelling of your face and throat  . A fast heartbeat  . A bad rash all over your body  . Dizziness and weakness    Immunizations Administered    Name Date Dose VIS Date Route   Pfizer COVID-19 Vaccine 11/15/2019 12:43 PM 0.3 mL 08/28/2019 Intramuscular   Manufacturer: [place]   Lot: [id number]   [place]: [id number]

## 2019-12-15 ENCOUNTER — Ambulatory Visit: Payer: Medicare HMO | Attending: Internal Medicine

## 2019-12-15 DIAGNOSIS — Z23 Encounter for immunization: Secondary | ICD-10-CM

## 2019-12-15 NOTE — Progress Notes (Signed)
   Covid-19 Vaccination Clinic  Name:  EYTHAN BEBEAU    MRN: [id number] DOB: 1946-08-27  12/15/2019  Mr. Lay was observed post Covid-19 immunization for 15 minutes without incident. He was provided with Vaccine Information Sheet and instruction to access the V-Safe system.   Mr. Sisk was instructed to call 911 with any severe reactions post vaccine: Marland Kitchen Difficulty breathing  . Swelling of face and throat  . A fast heartbeat  . A bad rash all over body  . Dizziness and weakness   Immunizations Administered    Name Date Dose VIS Date Route   Pfizer COVID-19 Vaccine 12/15/2019 12:13 PM 0.3 mL 08/28/2019 Intramuscular   Manufacturer: [employer]   Lot: [id number]   [place]: [id number]

## 2019-12-18 ENCOUNTER — Other Ambulatory Visit: Payer: Self-pay | Admitting: Family Medicine

## 2019-12-18 DIAGNOSIS — I10 Essential (primary) hypertension: Secondary | ICD-10-CM

## 2019-12-18 DIAGNOSIS — R69 Illness, unspecified: Secondary | ICD-10-CM | POA: Diagnosis not present

## 2019-12-23 ENCOUNTER — Other Ambulatory Visit: Payer: Self-pay | Admitting: Family Medicine

## 2019-12-23 DIAGNOSIS — N401 Enlarged prostate with lower urinary tract symptoms: Secondary | ICD-10-CM

## 2019-12-23 DIAGNOSIS — N529 Male erectile dysfunction, unspecified: Secondary | ICD-10-CM

## 2019-12-28 ENCOUNTER — Other Ambulatory Visit: Payer: Self-pay | Admitting: Family Medicine

## 2019-12-28 DIAGNOSIS — K219 Gastro-esophageal reflux disease without esophagitis: Secondary | ICD-10-CM

## 2019-12-30 ENCOUNTER — Encounter: Payer: Self-pay | Admitting: Family Medicine

## 2019-12-30 DIAGNOSIS — K219 Gastro-esophageal reflux disease without esophagitis: Secondary | ICD-10-CM

## 2019-12-30 MED ORDER — OMEPRAZOLE 40 MG PO CPDR: 40.0000 mg | DELAYED_RELEASE_CAPSULE | Freq: Every day | ORAL | 1 refills | Status: DC

## 2019-12-30 MED ORDER — DICLOFENAC SODIUM 75 MG PO TBEC: DELAYED_RELEASE_TABLET | ORAL | 2 refills | Status: DC

## 2020-01-21 ENCOUNTER — Encounter: Payer: Self-pay | Admitting: Family Medicine

## 2020-01-21 DIAGNOSIS — G720 Drug-induced myopathy: Secondary | ICD-10-CM | POA: Insufficient documentation

## 2020-01-21 HISTORY — DX: Drug-induced myopathy: G72.0

## 2020-02-17 ENCOUNTER — Other Ambulatory Visit: Payer: Self-pay | Admitting: Family Medicine

## 2020-02-17 DIAGNOSIS — I10 Essential (primary) hypertension: Secondary | ICD-10-CM

## 2020-02-17 DIAGNOSIS — R69 Illness, unspecified: Secondary | ICD-10-CM | POA: Diagnosis not present

## 2020-03-12 ENCOUNTER — Other Ambulatory Visit: Payer: Self-pay | Admitting: Family Medicine

## 2020-03-12 DIAGNOSIS — E1140 Type 2 diabetes mellitus with diabetic neuropathy, unspecified: Secondary | ICD-10-CM

## 2020-03-15 ENCOUNTER — Encounter: Payer: Self-pay | Admitting: Family Medicine

## 2020-03-17 ENCOUNTER — Telehealth: Payer: Self-pay | Admitting: Family Medicine

## 2020-03-17 NOTE — Progress Notes (Signed)
  Chronic Care Management   Note  03/17/2020 Name: Timothy Chapman MRN: [id number] DOB: 12/19/1945  Timothy Chapman is a 74 y.o. year old male who is a primary care patient of Copland, Gay Filler, MD. I reached out to [employer] by phone today in response to a referral sent by Timothy Chapman PCP, Copland, Gay Filler, MD.   Timothy Chapman was given information about Chronic Care Management services today including:  1. CCM service includes personalized support from designated clinical staff supervised by his physician, including individualized plan of care and coordination with other care providers 2. 24/7 contact phone numbers for assistance for urgent and routine care needs. 3. Service will only be billed when office clinical staff spend 20 minutes or more in a month to coordinate care. 4. Only one practitioner may furnish and bill the service in a calendar month. 5. The patient may stop CCM services at any time (effective at the end of the month) by phone call to the office staff.   Patient agreed to services and verbal consent obtained.  This note is not being shared with the patient for the following reason: To respect privacy (The patient or proxy has requested that the information not be shared).  Follow up plan:   Earney [place] [facility] Scheduler

## 2020-03-19 ENCOUNTER — Other Ambulatory Visit: Payer: Self-pay | Admitting: Family Medicine

## 2020-03-31 DIAGNOSIS — R69 Illness, unspecified: Secondary | ICD-10-CM | POA: Diagnosis not present

## 2020-04-03 ENCOUNTER — Other Ambulatory Visit: Payer: Self-pay | Admitting: Family Medicine

## 2020-05-07 ENCOUNTER — Other Ambulatory Visit: Payer: Self-pay | Admitting: Family Medicine

## 2020-05-07 DIAGNOSIS — E1140 Type 2 diabetes mellitus with diabetic neuropathy, unspecified: Secondary | ICD-10-CM

## 2020-05-13 DIAGNOSIS — R69 Illness, unspecified: Secondary | ICD-10-CM | POA: Diagnosis not present

## 2020-06-14 ENCOUNTER — Other Ambulatory Visit: Payer: Self-pay

## 2020-06-14 DIAGNOSIS — E1140 Type 2 diabetes mellitus with diabetic neuropathy, unspecified: Secondary | ICD-10-CM

## 2020-06-14 DIAGNOSIS — E785 Hyperlipidemia, unspecified: Secondary | ICD-10-CM

## 2020-06-14 DIAGNOSIS — I10 Essential (primary) hypertension: Secondary | ICD-10-CM

## 2020-06-14 DIAGNOSIS — N401 Enlarged prostate with lower urinary tract symptoms: Secondary | ICD-10-CM

## 2020-06-16 ENCOUNTER — Telehealth: Payer: Self-pay | Admitting: Pharmacist

## 2020-06-16 NOTE — Progress Notes (Addendum)
Chronic Care Management Pharmacy Assistant   Name: Timothy Chapman  MRN: [id number] DOB: Aug 23, 1946  Reason for Encounter: Initial Questions    PCP : [name], MD  Allergies:   Allergies  Allergen Reactions  . Codeine     hallucinations  . Statins     Has tried several cannot tolerate    Medications: Outpatient Encounter Medications as of 06/16/2020  Medication Sig Note  . Acetylcarnitine HCl 500 MG CAPS Take 500 mg by mouth daily.   . Alcohol Swabs (B-D SINGLE USE SWABS REGULAR) PADS USE PRIOR TO INSULIN DOSE   . Alpha-Lipoic Acid 600 MG CAPS Take 600 mg by mouth daily. 11/21/2017: ON HOLD  . amLODipine (NORVASC) 10 MG tablet TAKE 1 TABLET DAILY   . aspirin 81 MG chewable tablet Chew 1 tablet (81 mg total) by mouth daily.   . B-D UF III MINI PEN NEEDLES 31G X 5 MM MISC TO USE WITH VICTOZA   . benazepril (LOTENSIN) 40 MG tablet TAKE 1 TABLET DAILY   . chlorthalidone (HYGROTON) 25 MG tablet Take 1 tablet (25 mg total) by mouth daily.   . diclofenac (VOLTAREN) 75 MG EC tablet TAKE 1 TABLET TWICE A DAY  AS NEEDED FOR JOINT PAINS   . famotidine (PEPCID) 20 MG tablet Take 1 tablet (20 mg total) by mouth 2 (two) times daily.   Marland Kitchen glipiZIDE (GLUCOTROL) 10 MG tablet TAKE 1 TABLET TWICE A DAY  BEFORE A MEAL   . hydrochlorothiazide (HYDRODIURIL) 25 MG tablet TAKE 1 TABLET DAILY   . Lancets (ONETOUCH DELICA PLUS LOVFIE33I) MISC USE TWO TIMES A DAY TO     CHECK BLOOD SUGAR   . liraglutide (VICTOZA) 18 MG/3ML SOPN 1.2 ml   . Melatonin 3 MG TABS Take 1 tablet by mouth at bedtime.    . metFORMIN (GLUCOPHAGE) 1000 MG tablet TAKE 1 TABLET TWICE DAILY  WITH MEALS   . Multiple Vitamin (MULTIVITAMIN) tablet Take 1 tablet by mouth daily.   . niacin (NIASPAN) 500 MG CR tablet Take 1 tablet (500 mg total) by mouth at bedtime.   Marland Kitchen omeprazole (PRILOSEC) 40 MG capsule Take 1 capsule (40 mg total) by mouth daily.   Glory Rosebush VERIO test strip USE AS INSTRUCTED   . tadalafil (CIALIS) 20 MG  tablet Take 1 tablet (20 mg total) by mouth daily as needed for erectile dysfunction. Max 20 mg total per day   . tadalafil (CIALIS) 5 MG tablet TAKE 1 TABLET BY MOUTH DAILY   . Turmeric 450 MG CAPS Take 1 tablet by mouth daily.    . vitamin C (ASCORBIC ACID) 500 MG tablet Take 500 mg by mouth daily.    No facility-administered encounter medications on file as of 06/16/2020.    Current Diagnosis: Patient Active Problem List   Diagnosis Date Noted  . 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 Paroxysmal atrial fibrillation ([place]) 12/16/2017  . SBO (small bowel obstruction) ([place]) 10/08/2017  . ATN (acute tubular necrosis) ([place]) 10/08/2017  . Septic shock ([place]) 10/04/2017  . Acute cholecystitis s/p perc cholecystomy drainage 10/04/2017   . Acute respiratory failure with hypoxia ([place])   . Chronic back pain 02/08/2016  . Controlled type 2 diabetes mellitus with diabetic neuropathy, without long-term current use of insulin ([place]) 02/08/2016  . Insomnia 02/08/2016  . History of diverticulitis 02/08/2016  . HYPERTENSION, BENIGN ESSENTIAL, UNCONTROLLED 03/12/2009  . ABNORMAL ELECTROCARDIOGRAM 03/12/2009    Goals Addressed   None     Have  you seen any other providers since your last visit?  Patient states he goes to the [place].   Any changes in your medications or health? Patient states he started taking Ozempic instead of Victoza about three months ago. Also taking Metformin ER 500 mg as opposed to 1000 mg.   Any side effects from any medications?  Patient states he experienced chronic diarrhea with Metformin, therefore, his provider at the [place] [facility] changed the dose to 500 ER mg.   Do you have an symptoms or problems not managed by your medications? Patient states he has intermittent balance issues throughout the day. He recently fell on this past Monday due to feeling off balanced   Any concerns about your health right now? Patient states even after the switch from Ozempic to Victoza his blood sugar is  still high   Has your provider asked that you check blood pressure, blood sugar, or follow special diet at home? Patient was asked to check his BS daily. He usually checks is fasting and then later in the evening.   Do you get any type of exercise on a regular basis? Patient states he gets as much exercise as he can.   Can you think of a goal you would like to reach for your health? Patient is concerned that he is not as active as he use to be due to his balance issues.   Do you have any problems getting your medications? Patient states some of his medications are not covered by insurance. Maybe interested in PAP.   Is there anything that you would like to discuss during the appointment? Patient would like to get unbalanced feeling under control. Also stating he is having feet pain.   Reminded patient to please have medications and supplements available at the time of his appointment Monday October 4th at 2:pm over the telephone.   Follow-Up:  Pharmacist Review   Fanny Skates, [place] Pharmacist Assistant [phone]  Reviewed by: De Blanch, PharmD Clinical Pharmacist [facility] [phone]

## 2020-06-20 ENCOUNTER — Ambulatory Visit: Payer: Medicare HMO | Admitting: Pharmacist

## 2020-06-20 ENCOUNTER — Other Ambulatory Visit: Payer: Self-pay

## 2020-06-20 DIAGNOSIS — E785 Hyperlipidemia, unspecified: Secondary | ICD-10-CM

## 2020-06-20 DIAGNOSIS — I10 Essential (primary) hypertension: Secondary | ICD-10-CM

## 2020-06-20 DIAGNOSIS — G720 Drug-induced myopathy: Secondary | ICD-10-CM

## 2020-06-20 DIAGNOSIS — E1140 Type 2 diabetes mellitus with diabetic neuropathy, unspecified: Secondary | ICD-10-CM

## 2020-06-20 NOTE — Chronic Care Management (AMB) (Signed)
Chronic Care Management Pharmacy  Name: Timothy Chapman  MRN: [id number] DOB: 04-15-1946  Chief Complaint/ HPI  Timothy Chapman,  74 y.o. , male presents for their Initial CCM visit with the clinical pharmacist via telephone due to COVID-19 Pandemic.  PCP : Timothy Mclean, MD  Their chronic conditions include: Hypertension, Hyperlipidemia, Diabetes, Insomnia, GERD, Pain, BPH/ED  Office Visits: 09/25/19: Visit w/ Timothy Pai, PA-C - HTN. Added chlorthalidone 62m/Timothy Chapman.   Consult Visit: 05/31/20: Ophthalmology visit w/ Dr. SManuella Chapman- 10 week F/U. mOCT shows decreased retinal edema OS. Keep interval at 10 weeks. IVE OS #10 given, F/U in 10 weeks for IVE OS #11  Medications: Outpatient Encounter Medications as of 06/20/2020  Medication Sig Note  . Acetylcarnitine HCl 500 MG CAPS Take 500 mg by mouth daily.   . Alpha-Lipoic Acid 600 MG CAPS Take 600 mg by mouth daily. 11/21/2017: ON HOLD  . amLODipine (NORVASC) 10 MG tablet TAKE 1 TABLET DAILY   . aspirin 81 MG chewable tablet Chew 1 tablet (81 mg total) by mouth daily.   . benazepril (LOTENSIN) 40 MG tablet TAKE 1 TABLET DAILY   . diclofenac (VOLTAREN) 75 MG EC tablet TAKE 1 TABLET TWICE A Timothy Chapman  AS NEEDED FOR JOINT PAINS   . famotidine (PEPCID) 20 MG tablet Take 1 tablet (20 mg total) by mouth 2 (two) times daily.   .Marland KitchenglipiZIDE (GLUCOTROL) 10 MG tablet TAKE 1 TABLET TWICE A Timothy Chapman  BEFORE A MEAL   . hydrochlorothiazide (HYDRODIURIL) 25 MG tablet TAKE 1 TABLET DAILY 06/20/2020: Taking every 2 days  . Melatonin 3 MG TABS Take 1 tablet by mouth at bedtime.    . metFORMIN (GLUCOPHAGE-XR) 500 MG 24 hr tablet Take 500 mg by mouth 2 (two) times daily.   . Multiple Vitamin (MULTIVITAMIN) tablet Take 1 tablet by mouth daily.   . niacin (NIASPAN) 1000 MG CR tablet Take 1,000 mg by mouth at bedtime.   .Marland Kitchenomeprazole (PRILOSEC) 40 MG capsule Take 1 capsule (40 mg total) by mouth daily.   . Semaglutide, 1 MG/DOSE, (OZEMPIC, 1 MG/DOSE,) 2 MG/1.5ML SOPN  Inject 1 mg into the skin once a week.   . tadalafil (CIALIS) 20 MG tablet Take 1 tablet (20 mg total) by mouth daily as needed for erectile dysfunction. Max 20 mg total per Timothy Chapman   . tadalafil (CIALIS) 5 MG tablet TAKE 1 TABLET BY MOUTH DAILY   . Turmeric 450 MG CAPS Take 1 tablet by mouth daily.    . vitamin C (ASCORBIC ACID) 500 MG tablet Take 500 mg by mouth daily.   . Alcohol Swabs (B-D SINGLE USE SWABS REGULAR) PADS USE PRIOR TO INSULIN DOSE   . B-D UF III MINI PEN NEEDLES 31G X 5 MM MISC TO USE WITH VICTOZA   . chlorthalidone (HYGROTON) 25 MG tablet Take 1 tablet (25 mg total) by mouth daily. (Patient not taking: Reported on 06/20/2020) 06/20/2020: Reports he never took this  . Lancets (ONETOUCH DELICA PLUS [id number] MISC USE TWO TIMES A Timothy Chapman TO     CHECK BLOOD SUGAR   . liraglutide (VICTOZA) 18 MG/3ML SOPN 1.2 ml 06/20/2020: [facility]  . metFORMIN (GLUCOPHAGE) 1000 MG tablet TAKE 1 TABLET TWICE DAILY  WITH MEALS 06/20/2020: Could not tolerate. Had diarrhea. Diarrhea also with metformin XR 10066m  . niacin (NIASPAN) 500 MG CR tablet Take 1 tablet (500 mg total) by mouth at bedtime. (Patient not taking: Reported on 06/20/2020) 06/20/2020: Taking 100070m  OTC from [facility]. Prescription strength not covered by insurance  . ONETOUCH VERIO test strip USE AS INSTRUCTED    No facility-administered encounter medications on file as of 06/20/2020.   SDOH Screenings   Alcohol Screen:   . Last Alcohol Screening Score (AUDIT): Not on file  Depression (PHQ2-9): Low Risk   . PHQ-2 Score: 0  Financial Resource Strain: Medium Risk  . Difficulty of Paying Living Expenses: Somewhat hard  Food Insecurity:   . Worried About Charity fundraiser in the Last Year: Not on file  . Ran Out of Food in the Last Year: Not on file  Housing:   . Last Housing Risk Score: Not on file  Physical Activity:   . Days of Exercise per Week: Not on file  . Minutes of Exercise per Session: Not on file  Social  Connections:   . Frequency of Communication with Friends and Family: Not on file  . Frequency of Social Gatherings with Friends and Family: Not on file  . Attends Religious Services: Not on file  . Active Member of Clubs or Organizations: Not on file  . Attends Archivist Meetings: Not on file  . Marital Status: Not on file  Stress:   . Feeling of Stress : Not on file  Tobacco Use: Medium Risk  . Smoking Tobacco Use: Former Smoker  . Smokeless Tobacco Use: Never Used  Transportation Needs:   . Film/video editor (Medical): Not on file  . Lack of Transportation (Non-Medical): Not on file    Current Diagnosis/Assessment:  Goals Addressed            This Visit'Timothy Progress   . Chronic Care Management Pharmacy Care Plan       CARE PLAN ENTRY (see longitudinal plan of care for additional care plan information)  Current Barriers:  . Chronic Disease Management support, education, and care coordination needs related to Hypertension, Hyperlipidemia, Diabetes, Insomnia, GERD, Pain, BPH/ED   Hypertension BP Readings from Last 3 Encounters:  09/25/19 (!) 176/99  09/25/19 (!) 149/81  05/28/19 124/80   . Pharmacist Clinical Goal(Timothy): o Over the next 90 days, patient will work with PharmD and providers to achieve BP goal <140/90 . Current regimen:  . Amlodipine 8m daily . Benazepril 452mdaily . HCTZ 2569maily (taking every 2 days) . Interventions: o Discussed BP goal o Discussed diet and exercise o Discussed balance of controlling BP and preventing medication side effects . Patient self care activities - Over the next 90 days, patient will: o Check BP 1-3 times per week, document, and provide at future appointments o Ensure daily salt intake < 2300 mg/Lionel Woodberry o Follow up with neurology to see if there is a cause of dizziness outside of amlodipine  Hyperlipidemia Lab Results  Component Value Date/Time   LDLDIRECT 88.0 05/28/2019 11:10 AM   . Pharmacist Clinical  Goal(Timothy): o Over the next 180 days, patient will work with PharmD and providers to maintain LDL goal < 100, achieve goal of TRIG <150 . Current regimen:  . Aspirin 58m93mily . Niaspan CR 1000mg83m. Salmon Oil 1000mg 42me [facility] . [place]: o Discussed lipid goals and risk associated with elevated TRIG . Patient self care activities - Over the next 180 days, patient will: o Maintain cholesterol medication regimen.   Diabetes Lab Results  Component Value Date/Time   HGBA1C 7.3 (H) 05/28/2019 11:10 AM   HGBA1C 8.2 08/12/2018 12:00 AM   HGBA1C 6.8 (H) 03/31/2018 01:42  PM   . Pharmacist Clinical Goal(Timothy): o Over the next 90 days, patient will work with PharmD and providers to achieve A1c goal <7% . Current regimen:  Marland Kitchen Glipizide 8m twice daily . Ozempic 150mweekly  . Metformin XR 50076mwice daily . Interventions: o Discussed diet and exercise o Discussed DM goals . Patient self care activities - Over the next 90 days, patient will: o Check blood sugar once daily, document, and provide at future appointments o Contact provider with any episodes of hypoglycemia  GERD . Pharmacist Clinical Goal(Timothy) o Over the next 90 days, patient will work with PharmD and providers to reduce symptoms associated with GERD . Current regimen:  . Famotidine 79m73mice daily . Omeprazole 40mg72mly at bedtime . Interventions: o Recommended patient take omeprazole in AM on empty stomach and take famotidine at bedtime . Patient self care activities - Over the next 90 days, patient will: o Take omeprazole in AM on empty stomach and take famotidine at bedtime  Health Maintenance  . Pharmacist Clinical Goal(Timothy) o Over the next 90 days, patient will work with PharmD and providers to complete health maintenance screenings/vaccinations . Interventions: o Recommended patient receive flu vaccine o Discussed Shingrix vaccines ([facility]) .[facility] H[facility] self care activities -  Over the next 90 days, patient will: o Provide vaccine dates for Shingrix series o Receive flu vaccine    Medication management . Pharmacist Clinical Goal(Timothy): o Over the next 90 days, patient will work with PharmD and providers to maintain optimal medication adherence . [facility] . Interventions o Comprehensive medication review performed. o Continue current medication management strategy o Discussed importance of medication dispensing continuity to reduce risk of error . Patient self care activities - Over the next 90 days, patient will: o Focus on medication adherence by filling and taking medications appropriately  o Take medications as prescribed o Report any questions or concerns to PharmD and/or provider(Timothy)  Initial goal documentation       Social Hx:  His wife is expecting a baby. January 3rd is due date. Wants to get healthy in preparation for the child. Has an older daughter and 3 grandchildren. Retired from salesPress photographeryears. Stopped working due to fear of dizziness.  Was in the [place] for a year.  Patient'Timothy Top 3 Health Goals 1. D/C amlodipine 2. Lower blood sugar 3. Lose weight  Hypertension   BP goal is:  <140/90  Office blood pressures are  BP Readings from Last 3 Encounters:  09/25/19 (!) 176/99  09/25/19 (!) 149/81  05/28/19 124/80   Patient checks BP at home 1-2x per week Patient home BP readings are ranging:  _0 152 81 Avg 143.5 80  Patient has failed these meds in the past: None noted  Patient is currently uncontrolled on the following medications:  . Amlodipine 10mg 2my . Benazepril 40mg d77m . Chlorthalidone 25mg da42m(not taking, also not in dispense report) . HCTZ 25mg dai56mnot taking daily, takes every 2 days)   "Not fond of amlodipine" Feels he has loss balance that he associated with start of amlodipine. Dizziness/loss of balance all throughout the Rekha Hobbins. No headaches  or chest pain[facility] Has eliminated salt from diet. Watching sugar intake.  Has fallen twice due to dog tripping him.  Feels the hctz is drying him out so only takes it every  2 days.  Never started chlorthalidone. His top medical goal is to D/C amlodipine. He does not appreciate that it affects his "love life"  Explained that all BP meds have the potential to affect this area of his life as well uncontrolled hypertension itself.  Side Note: Afib listed in pt'Timothy chart, but pt states this was episodic and resolved. No medication therapy given for this.  We discussed diet and exercise extensively as well as the lifestyle modifications needed to help reduce the number of antihypertensives he takes. He is aware he needs to lose weight. Discussed his BP goal and how his home BP is above goal, so stopped amlodipine without an alternative would not be recommended. He is very hesitant about side effects of other BP meds, could pursue the chlorthalidone option Percell Miller proposed at his January visit.  Plan -Continue checking BP 1-2 times per week. Can increase to 3-4 times if agreeable.  -Continue current medications  -Follow up with Dr. Lorelei Pont. Need updated labs (cmp).  Future Plan -Discuss alternatives for amlodipine and/or improve lifestyle to allow for D/C of amlodipine w/o replacement and controlled BP    Hyperlipidemia   LDL goal <100 TRIG goal <150  Lipid Panel     Component Value Date/Time   CHOL 179 05/28/2019 1110   TRIG (H) 05/28/2019 1110    529.0 Triglyceride is over 400; calculations on Lipids are invalid.   HDL 28.20 (L) 05/28/2019 1110   LDLDIRECT 88.0 05/28/2019 1110    Hepatic Function Latest Ref Rng & Units 05/28/2019 03/31/2018 12/12/2017  Total Protein 6.0 - 8.3 g/dL 7.5 7.6 8.0  Albumin 3.5 - 5.2 g/dL 4.4 4.6 4.3  AST 0 - 37 U/L 20 26 61(H)  ALT 0 - 53 U/L 27 34 116(H)  Alk Phosphatase 39 - 117 U/L 40 39 42  Total Bilirubin 0.2 - 1.2  mg/dL 0.3 0.3 1.0  Bilirubin, Direct 0.1 - 0.5 mg/dL - - -     The 10-year ASCVD risk score Mikey Bussing DC Jr., et al., 2013) is: 49.8%   Values used to calculate the score:     Age: 40 years     Sex: Male     Is Non-Hispanic African American: No     Diabetic: Yes     Tobacco smoker: No     Systolic Blood Pressure: 676 mmHg     Is BP treated: Yes     HDL Cholesterol: 28.2 mg/dL     Total Cholesterol: 179 mg/dL   Patient has failed these meds in past: statins (has tried several and cannot tolerate - G72.0) Patient is currently uncontrolled for TRIG on the following medications:  . Aspirin 55m daily . Niaspan CR 5041mHS (insurance wouldn't cover; taking OTC 100019mS) . Salmon Oil 1000m93mice daily per [facility]  [place] reports severe cramping with statins [place] [facility] [place]  Encouraged patient to get updated labs at clinic and/or provide [facility] lab results  We discussed:  Lipid goals and risk of pancreatitis with elevated TRIG  Plan -Continue current medications  Diabetes   A1c goal <7%  Recent Relevant Labs: Lab Results  Component Value Date/Time   HGBA1C 7.3 (H) 05/28/2019 11:10 AM   HGBA1C 8.2 08/12/2018 12:00 AM   HGBA1C 6.8 (H) 03/31/2018 01:42 PM   GFR 58.58 (L) 05/28/2019 11:10 AM   GFR 57.05 (L) 03/31/2018 01:42 PM   MICROALBUR 1.1 03/31/2018 01:42 PM    Last diabetic Eye exam: No results  found for: HMDIABEYEEXA  Last diabetic Foot exam: No results found for: HMDIABFOOTEX   Checking BG: Daily  Recent FBG Readings: _0 Avg 186.7  Patient has failed these meds in past: metformin IR, metformin 1020m XR Patient is currently uncontrolled on the following medications: .Marland KitchenGlipizide 170mtwice daily . Ozempic 60m82meekly ([facility] Metformin XR 500m260mice daily (changed from 1000mg4men tried 1000mg 660mnd unable to tolerate)  [facility]orts [facility] [place] with  metformin XR 1000mg H34meen on the 60mg oze29mc x 1 month, did 0.5mg x 2 560mths  Diet Trying to do low carb diet as much as he can He is doing intermittent fasting (has x 2 years) B - eggs and bacon, sometimes with toast D - chicken or steak, brussel sprouts or broccoli Snacks - very little (pecans) Drink - water 99% of the time, coffee  Exercise Limited due to concerns with balance  We discussed: diet and exercise extensively and DM goals  Plan -Continue current medications  -F/U with Dr. Copland fLorelei Pontted labs (a1c, ma/cr)    Insomnia    Patient has failed these meds in past: None noted  Patient is currently controlled on the following medications: . MelatonMarland Kitchenn 3mg HS  H29ms him fall asleep, but doesn't keep him asleep (moreso due to BPH)  Plan -Continue current medications   GERD   Patient has failed these meds in past: None noted  Patient is currently uncontrolled on the following medications:  . Famotidine 20mg twice4mly . Omeprazole 40mg daily 37medtime  Breakthrough Sx: Daily Breakthrough Tx:  Triggers: None he can identify, tries to be careful with acid he eats  He takes omeprazole at bedtime. Feels he is having complications from gallbladder surgery. Oxbile? To replace bile; feels it helps  We discussed:  Take omeprazole in AM on empty stomach and take famotidine at bedtime. See if this resolves GERD symptoms  Plan -Take omeprazole in the AM on empty stomach vs at bedtime (last meal at 7pm, bedtime around 10pm, so this is not a completely empty stomach) -Take famotidine at bedtime  Pain    Patient has failed these meds in past: codeine (hallucinations) Patient is currently stable on the following medications: . Diclofenac 75mg twice d36m  "Not working well for me, but I can definitely tell when I don't take it" Has tried in combo with tylenol and this has helped Admits to taking ibuprofen in combo with diclofenac.  Discussed that  ibuprofen and diclofenac should be avoided in combination due to duplication in therapy (NSAIDs) and the risk for kidney injury  Plan -Continue current medications   Future Plan -Explore other options for pain   BPH/ED   Followed by Urology  PSA  Date Value Ref Range Status  05/28/2019 6.75 (H) 0.10 - 4.00 ng/mL Final    Comment:    Test performed using Access Hybritech PSA Assay, a parmagnetic partical, chemiluminecent immunoassay.  07/09/2017 6.57 (H) 0.10 - 4.00 ng/mL Final    Comment:    Test performed using Access Hybritech PSA Assay, a parmagnetic partical, chemiluminecent immunoassay.  02/08/2016 5.11 (H) 0.10 - 4.00 ng/mL Final     Patient has failed these meds in past: None noted  Patient is currently uncontrolled on the following medications:  . Tadalafil 5mg daily . T44mlafil 20mg as needed24mtinging [place]ettle Root 750mg daily (rep76m recommended per  urology)  Reports he was also recommended to start saw palmetto, but he has not started this. Doing watchful waiting.  Recommended to get another biopsy or MRI. His schedule limited him from proceeding with these. Reports he has a urology visit before end of year  Plan -Continue current medications   Vaccines   Reviewed and discussed patient'Timothy vaccination history.    Immunization History  Administered Date(Timothy) Administered  . Fluad Quad(high Dose 65+) 05/28/2019  . Influenza, High Dose Seasonal PF 06/20/2016, 07/08/2017  . PFIZER SARS-COV-2 Vaccination 11/15/2019, 12/15/2019  . Pneumococcal Conjugate-13 06/20/2016  . Pneumococcal Polysaccharide-23 05/28/2019   Patient states he has received both Shingrix vaccines at the [place], but can not report specific dates. Requested that patient request vaccine records from [place]. Vaccines not listed in [facility].   Plan  Recommended patient receive flu vaccine in pharmacy or office.   Future Plan -Update [facility] record once patient has vaccine dates for Shingrix.  Medication  Management   [facility], [facility] pharmacy for all medications Uses pill box? Yes does a month at a time Pt endorses 100% compliance  Discussed how filling medications at multiple pharmacies increases risk of error.   Miscellaneous Meds  Acetylcarnitine 519m daily Alpha-Lipoic Acid 6031mdaily Multivitamin daily Turmeric Vitamin C  Meds to D/C from list Victoza Metformin IR 100058miaspan CR 500m39me discussed: Current pharmacy is preferred with insurance plan and patient is satisfied with pharmacy services  Plan  Continue current medication management strategy    Follow up: 2 week phone visit  KaneDe BlancharmD Clinical Pharmacist [facility]-[phone]

## 2020-06-20 NOTE — Patient Instructions (Signed)
Visit Information  Goals Addressed            This Visit's Progress   . Chronic Care Management Pharmacy Care Plan       CARE PLAN ENTRY (see longitudinal plan of care for additional care plan information)  Current Barriers:  . Chronic Disease Management support, education, and care coordination needs related to Hypertension, Hyperlipidemia, Diabetes, Insomnia, GERD, Pain, BPH/ED   Hypertension BP Readings from Last 3 Encounters:  09/25/19 (!) 176/99  09/25/19 (!) 149/81  05/28/19 124/80   . Pharmacist Clinical Goal(s): o Over the next 90 days, patient will work with PharmD and providers to achieve BP goal <140/90 . Current regimen:  . Amlodipine 10mg  daily . Benazepril 40mg  daily . HCTZ 25mg  daily (taking every 2 days) . Interventions: o Discussed BP goal o Discussed diet and exercise o Discussed balance of controlling BP and preventing medication side effects . Patient self care activities - Over the next 90 days, patient will: o Check BP 1-3 times per week, document, and provide at future appointments o Ensure daily salt intake < 2300 mg/Timothy Chapman o Follow up with neurology to see if there is a cause of dizziness outside of amlodipine  Hyperlipidemia Lab Results  Component Value Date/Time   LDLDIRECT 88.0 05/28/2019 11:10 AM   . Pharmacist Clinical Goal(s): o Over the next 180 days, patient will work with PharmD and providers to maintain LDL goal < 100, achieve goal of TRIG <150 . Current regimen:  . Aspirin 81mg  daily . Niaspan CR 1000mg  HS . Salmon Oil 1000mg  twice daily per [place] . Interventions: o Discussed lipid goals and risk associated with elevated TRIG . Patient self care activities - Over the next 180 days, patient will: o Maintain cholesterol medication regimen.   Diabetes Lab Results  Component Value Date/Time   HGBA1C 7.3 (H) 05/28/2019 11:10 AM   HGBA1C 8.2 08/12/2018 12:00 AM   HGBA1C 6.8 (H) 03/31/2018 01:42 PM   . Pharmacist Clinical  Goal(s): o Over the next 90 days, patient will work with PharmD and providers to achieve A1c goal <7% . Current regimen:  Marland Kitchen Glipizide 10mg  twice daily . Ozempic 1mg  weekly  . Metformin XR 500mg  twice daily . Interventions: o Discussed diet and exercise o Discussed DM goals . Patient self care activities - Over the next 90 days, patient will: o Check blood sugar once daily, document, and provide at future appointments o Contact provider with any episodes of hypoglycemia  GERD . Pharmacist Clinical Goal(s) o Over the next 90 days, patient will work with PharmD and providers to reduce symptoms associated with GERD . Current regimen:  . Famotidine 20mg  twice daily . Omeprazole 40mg  daily at bedtime . Interventions: o Recommended patient take omeprazole in AM on empty stomach and take famotidine at bedtime . Patient self care activities - Over the next 90 days, patient will: o Take omeprazole in AM on empty stomach and take famotidine at bedtime  Health Maintenance  . Pharmacist Clinical Goal(s) o Over the next 90 days, patient will work with PharmD and providers to complete health maintenance screenings/vaccinations . Interventions: o Recommended patient receive flu vaccine o Discussed Shingrix vaccines (pt reports completed series at [facility]) . Patient self care activities - Over the next 90 days, patient will: o Provide vaccine dates for Shingrix series o Receive flu vaccine    Medication management . Pharmacist Clinical Goal(s): o Over the next 90 days, patient will work with PharmD and providers to maintain optimal  medication adherence . Current pharmacy: [facility] . Interventions o Comprehensive medication review performed. o Continue current medication management strategy o Discussed importance of medication dispensing continuity to reduce risk of error . Patient self care activities - Over the next 90 days, patient will: o Focus on medication adherence by filling and  taking medications appropriately  o Take medications as prescribed o Report any questions or concerns to PharmD and/or provider(s)  Initial goal documentation        Timothy Chapman was given information about Chronic Care Management services today including:  1. CCM service includes personalized support from designated clinical staff supervised by his physician, including individualized plan of care and coordination with other care providers 2. 24/7 contact phone numbers for assistance for urgent and routine care needs. 3. Standard insurance, coinsurance, copays and deductibles apply for chronic care management only during months in which we provide at least 20 minutes of these services. Most insurances cover these services at 100%, however patients may be responsible for any copay, coinsurance and/or deductible if applicable. This service may help you avoid the need for more expensive face-to-face services. 4. Only one practitioner may furnish and bill the service in a calendar month. 5. The patient may stop CCM services at any time (effective at the end of the month) by phone call to the office staff.  Patient agreed to services and verbal consent obtained.   The patient verbalized understanding of instructions provided today and agreed to receive a mailed copy of patient instruction and/or educational materials. Telephone follow up appointment with pharmacy team member scheduled for: 07/04/2020  Melvenia Beam Doniqua Saxby, PharmD Clinical Pharmacist [facility] [phone]   Cholesterol Content in Foods Cholesterol is a waxy, fat-like substance that helps to carry fat in the blood. The body needs cholesterol in small amounts, but too much cholesterol can cause damage to the arteries and heart. Most people should eat less than 200 milligrams (mg) of cholesterol a Timothy Chapman. Foods with cholesterol  Cholesterol is found in animal-based foods, such as meat, seafood, and dairy.  Generally, low-fat dairy and lean meats have less cholesterol than full-fat dairy and fatty meats. The milligrams of cholesterol per serving (mg per serving) of common cholesterol-containing foods are listed below. Meat and other proteins  Egg -- one large whole egg has 186 mg.  Veal shank -- 4 oz has 141 mg.  Lean ground [place] (93% lean) -- 4 oz has 118 mg.  Fat-trimmed lamb loin -- 4 oz has 106 mg.  Lean ground beef (90% lean) -- 4 oz has 100 mg.  Lobster -- 3.5 oz has 90 mg.  Pork loin chops -- 4 oz has 86 mg.  Canned salmon -- 3.5 oz has 83 mg.  Fat-trimmed beef top loin -- 4 oz has 78 mg.  Frankfurter -- 1 frank (3.5 oz) has 77 mg.  Crab -- 3.5 oz has 71 mg.  Roasted chicken without skin, white meat -- 4 oz has 66 mg.  Light bologna -- 2 oz has 45 mg.  Deli-cut [place] -- 2 oz has 31 mg.  Canned tuna -- 3.5 oz has 31 mg.  Berniece Salines -- 1 oz has 29 mg.  Oysters and mussels (raw) -- 3.5 oz has 25 mg.  Mackerel -- 1 oz has 22 mg.  Trout -- 1 oz has 20 mg.  Pork sausage -- 1 link (1 oz) has 17 mg.  Salmon -- 1 oz has 16 mg.  Tilapia --  1 oz has 14 mg. Dairy  Soft-serve ice cream --  cup (4 oz) has 103 mg.  Whole-milk yogurt -- 1 cup (8 oz) has 29 mg.  Cheddar cheese -- 1 oz has 28 mg.  American cheese -- 1 oz has 28 mg.  Whole milk -- 1 cup (8 oz) has 23 mg.  2% milk -- 1 cup (8 oz) has 18 mg.  Cream cheese -- 1 tablespoon (Tbsp) has 15 mg.  Cottage cheese --  cup (4 oz) has 14 mg.  Low-fat (1%) milk -- 1 cup (8 oz) has 10 mg.  Sour cream -- 1 Tbsp has 8.5 mg.  Low-fat yogurt -- 1 cup (8 oz) has 8 mg.  Nonfat Greek yogurt -- 1 cup (8 oz) has 7 mg.  Half-and-half cream -- 1 Tbsp has 5 mg. Fats and oils  Cod liver oil -- 1 tablespoon (Tbsp) has 82 mg.  Butter -- 1 Tbsp has 15 mg.  Lard -- 1 Tbsp has 14 mg.  Bacon grease -- 1 Tbsp has 14 mg.  Mayonnaise -- 1 Tbsp has 5-10 mg.  Margarine -- 1 Tbsp has 3-10 mg. Exact amounts of  cholesterol in these foods may vary depending on specific ingredients and brands. Foods without cholesterol Most plant-based foods do not have cholesterol unless you combine them with a food that has cholesterol. Foods without cholesterol include:  Grains and cereals.  Vegetables.  Fruits.  Vegetable oils, such as olive, canola, and sunflower oil.  Legumes, such as peas, beans, and lentils.  Nuts and seeds.  Egg whites. Summary  The body needs cholesterol in small amounts, but too much cholesterol can cause damage to the arteries and heart.  Most people should eat less than 200 milligrams (mg) of cholesterol a Aneli Zara. This information is not intended to replace advice given to you by your health care provider. Make sure you discuss any questions you have with your health care provider. Document Revised: 08/16/2017 Document Reviewed: 04/30/2017 Elsevier Patient Education  [place].

## 2020-06-25 DIAGNOSIS — R69 Illness, unspecified: Secondary | ICD-10-CM | POA: Diagnosis not present

## 2020-07-01 ENCOUNTER — Telehealth: Payer: Self-pay | Admitting: Family Medicine

## 2020-07-01 DIAGNOSIS — E1140 Type 2 diabetes mellitus with diabetic neuropathy, unspecified: Secondary | ICD-10-CM

## 2020-07-01 MED ORDER — METFORMIN HCL 1000 MG PO TABS: ORAL_TABLET | ORAL | 0 refills | Status: DC

## 2020-07-01 NOTE — Telephone Encounter (Signed)
Medication: metFORMIN (GLUCOPHAGE-XR) 500 MG 24 hr tablet [[id number]]       Has the patient contacted their pharmacy? (If no, request that the patient contact the pharmacy for the refill.) (If yes, when and what did the pharmacy advise?)     Preferred Pharmacy (with phone number or street name):   [facility] [place], [place] - [address] Phone:  [phone]  Fax:  [phone]         Agent: Please be advised that RX refills may take up to 3 business days. We ask that you follow-up with your pharmacy.

## 2020-07-01 NOTE — Telephone Encounter (Signed)
30 day supply refilled and faxed to pharmacy for patient. E-scribing system down.

## 2020-07-04 ENCOUNTER — Telehealth: Payer: Self-pay

## 2020-07-04 ENCOUNTER — Ambulatory Visit: Payer: Medicare HMO

## 2020-07-04 DIAGNOSIS — E785 Hyperlipidemia, unspecified: Secondary | ICD-10-CM

## 2020-07-04 DIAGNOSIS — E1140 Type 2 diabetes mellitus with diabetic neuropathy, unspecified: Secondary | ICD-10-CM

## 2020-07-04 DIAGNOSIS — I10 Essential (primary) hypertension: Secondary | ICD-10-CM

## 2020-07-04 MED ORDER — METFORMIN HCL ER 500 MG PO TB24: 500.0000 mg | ORAL_TABLET | Freq: Two times a day (BID) | ORAL | 5 refills | Status: DC

## 2020-07-04 NOTE — Telephone Encounter (Signed)
Resent prescription to pharmacy.

## 2020-07-04 NOTE — Chronic Care Management (AMB) (Signed)
Chronic Care Management Pharmacy  Name: Timothy Chapman  MRN: [id number] DOB: [birth year]  Chief Complaint/ HPI  Timothy Chapman,  74 y.o. , male presents for their Initial CCM visit with the clinical pharmacist via telephone due to COVID-19 Pandemic.  PCP : [name], MD  Their chronic conditions include: Hypertension, Hyperlipidemia, Diabetes, Insomnia, GERD, Pain, BPH/ED  Office Visits: None since last CCM visit on 06/20/2020.    Consult Visit: None since last CCM visit on 06/20/2020.   Medications: Outpatient Encounter Medications as of 07/04/2020  Medication Sig Note  . Acetylcarnitine HCl 500 MG CAPS Take 500 mg by mouth daily.   . Alcohol Swabs (B-D SINGLE USE SWABS REGULAR) PADS USE PRIOR TO INSULIN DOSE   . Alpha-Lipoic Acid 600 MG CAPS Take 600 mg by mouth daily. 11/21/2017: ON HOLD  . amLODipine (NORVASC) 10 MG tablet TAKE 1 TABLET DAILY   . aspirin 81 MG chewable tablet Chew 1 tablet (81 mg total) by mouth daily.   . benazepril (LOTENSIN) 40 MG tablet TAKE 1 TABLET DAILY   . chlorthalidone (HYGROTON) 25 MG tablet Take 1 tablet (25 mg total) by mouth daily. 06/20/2020: Reports he never took this  . diclofenac (VOLTAREN) 75 MG EC tablet TAKE 1 TABLET TWICE A DAY  AS NEEDED FOR JOINT PAINS   . glipiZIDE (GLUCOTROL) 10 MG tablet TAKE 1 TABLET TWICE A DAY  BEFORE A MEAL   . hydrochlorothiazide (HYDRODIURIL) 25 MG tablet TAKE 1 TABLET DAILY (Patient taking differently: Take 12.5 mg by mouth daily. ) 06/20/2020: Taking every 2 days  . Lancets (ONETOUCH DELICA PLUS [id number]) MISC USE TWO TIMES A DAY TO     CHECK BLOOD SUGAR   . Melatonin 3 MG TABS Take 5 mg by mouth at bedtime.    . metFORMIN (GLUCOPHAGE) 1000 MG tablet TAKE 1 TABLET TWICE DAILY  WITH MEALS   . Multiple Vitamin (MULTIVITAMIN) tablet Take 1 tablet by mouth daily.   . niacin (NIASPAN) 1000 MG CR tablet Take 1,000 mg by mouth at bedtime.   Marland Kitchen omeprazole (PRILOSEC) 40 MG capsule Take 1 capsule (40 mg total)  by mouth daily.   Glory Rosebush VERIO test strip USE AS INSTRUCTED   . Semaglutide, 1 MG/DOSE, (OZEMPIC, 1 MG/DOSE,) 2 MG/1.5ML SOPN Inject 1 mg into the skin once a week.   . tadalafil (CIALIS) 5 MG tablet TAKE 1 TABLET BY MOUTH DAILY   . Turmeric 450 MG CAPS Take 1 tablet by mouth daily.    . vitamin C (ASCORBIC ACID) 500 MG tablet Take 500 mg by mouth daily.   . [DISCONTINUED] B-D UF III MINI PEN NEEDLES 31G X 5 MM MISC TO USE WITH VICTOZA   . [DISCONTINUED] famotidine (PEPCID) 20 MG tablet Take 1 tablet (20 mg total) by mouth 2 (two) times daily.   . [DISCONTINUED] liraglutide (VICTOZA) 18 MG/3ML SOPN 1.2 ml 06/20/2020: [facility]  . [DISCONTINUED] metFORMIN (GLUCOPHAGE-XR) 500 MG 24 hr tablet Take 500 mg by mouth 2 (two) times daily.   . [DISCONTINUED] metFORMIN (GLUCOPHAGE-XR) 500 MG 24 hr tablet Take 1 tablet (500 mg total) by mouth 2 (two) times daily.   . [DISCONTINUED] niacin (NIASPAN) 500 MG CR tablet Take 1 tablet (500 mg total) by mouth at bedtime. (Patient not taking: Reported on 06/20/2020) 06/20/2020: Taking 1012m OTC [facility] Prescription strength not covered by insurance  . [DISCONTINUED] tadalafil (CIALIS) 20 MG tablet Take 1 tablet (20 mg total) by mouth daily  as needed for erectile dysfunction. Max 20 mg total per day    No facility-administered encounter medications on file as of 07/04/2020.   SDOH Screenings   Alcohol Screen:   . Last Alcohol Screening Score (AUDIT): Not on file  Depression (PHQ2-9): Low Risk   . PHQ-2 Score: 0  Financial Resource Strain: Medium Risk  . Difficulty of Paying Living Expenses: Somewhat hard  Food Insecurity:   . Worried About Charity fundraiser in the Last Year: Not on file  . Ran Out of Food in the Last Year: Not on file  Housing:   . Last Housing Risk Score: Not on file  Physical Activity:   . Days of Exercise per Week: Not on file  . Minutes of Exercise per Session: Not on file  Social Connections:   . Frequency  of Communication with Friends and Family: Not on file  . Frequency of Social Gatherings with Friends and Family: Not on file  . Attends Religious Services: Not on file  . Active Member of Clubs or Organizations: Not on file  . Attends Archivist Meetings: Not on file  . Marital Status: Not on file  Stress:   . Feeling of Stress : Not on file  Tobacco Use: Medium Risk  . Smoking Tobacco Use: Former Smoker  . Smokeless Tobacco Use: Never Used  Transportation Needs:   . Film/video editor (Medical): Not on file  . Lack of Transportation (Non-Medical): Not on file    Current Diagnosis/Assessment:  Goals Addressed            This Visit's Progress   . Chronic Care Management Pharmacy Care Plan       CARE PLAN ENTRY (see longitudinal plan of care for additional care plan information)  Current Barriers:  . Chronic Disease Management support, education, and care coordination needs related to Hypertension, Hyperlipidemia, Diabetes, Insomnia, GERD, Pain, BPH/ED   Hypertension BP Readings from Last 3 Encounters:  09/25/19 (!) 176/99  09/25/19 (!) 149/81  05/28/19 124/80   . Pharmacist Clinical Goal(s): o Over the next 90 days, patient will work with PharmD and providers to achieve BP goal <140/90 . Current regimen:  . Amlodipine 93m daily . Benazepril 476mdaily . HCTZ 2578maily (taking every 2 days) . Interventions: o Discussed BP goal o Discussed diet and exercise o Discussed balance of controlling BP and preventing medication side effects . Patient self care activities - Over the next 90 days, patient will: o Check BP 1-3 times per week, document, and provide at future appointments o Ensure daily salt intake < 2300 mg/day o Follow up with neurology to see if there is a cause of dizziness outside of amlodipine  Hyperlipidemia    Component Value Date/Time   CHOL 179 05/28/2019 1110   TRIG (H) 05/28/2019 1110    529.0 Triglyceride is over 400; calculations  on Lipids are invalid.   HDL 28.20 (L) 05/28/2019 1110   LDLDIRECT 88.0   . Pharmacist Clinical Goal(s): o Over the next 180 days, patient will work with PharmD and providers to maintain LDL goal < 100, achieve goal of TRIG <150 . Current regimen:  . Aspirin 80m56mily . Niaspan CR 1000mg20m. Salmon Oil 1000mg 26me [facility] . [place]: o Discussed lipid goals and risk associated with elevated TRIG . Patient self care activities - Over the next 180 days, patient will: o Maintain cholesterol medication regimen.   Diabetes Lab Results  Component Value Date/Time  HGBA1C 7.3 (H) 05/28/2019 11:10 AM   HGBA1C 8.2 08/12/2018 12:00 AM   HGBA1C 6.8 (H) 03/31/2018 01:42 PM   GFR 58.58 (L) 05/28/2019 11:10 AM   GFR 57.05 (L) 03/31/2018 01:42 PM   MICROALBUR 1.1 03/31/2018 01:42 PM     . Pharmacist Clinical Goal(s): o Over the next 90 days, patient will work with PharmD and providers to achieve A1c goal <7% . Current regimen:  Marland Kitchen Glipizide 34m twice daily . Ozempic 130mweekly  . Metformin XR 50086mwice daily . Interventions: o Discussed diet and exercise o Discussed DM goals . Patient self care activities - Over the next 90 days, patient will: o Check blood sugar once daily, document, and provide at future appointments o Contact provider with any episodes of hypoglycemia  GERD . Pharmacist Clinical Goal(s) o Over the next 90 days, patient will work with PharmD and providers to reduce symptoms associated with GERD . Current regimen:  . Famotidine 4m61mice daily . Omeprazole 40mg53mly at bedtime . Interventions: o Recommended patient take omeprazole in AM on empty stomach and take famotidine at bedtime . Patient self care activities - Over the next 90 days, patient will: o Take omeprazole in AM on empty stomach and take famotidine at bedtime  Health Maintenance  . Pharmacist Clinical Goal(s) o Over the next 90 days, patient will work with PharmD and providers to  complete health maintenance screenings/vaccinations . Interventions: o Recommended patient receive flu vaccine o Discussed Shingrix vaccines ([facility]) .[facility] activities - Over the next 90 days, patient will: o Provide vaccine dates for Shingrix series o Receive flu vaccine    Medication management . Pharmacist Clinical Goal(s): o Over the next 90 days, patient will work with PharmD and providers to maintain optimal medication adherence . [facility] . Interventions o Comprehensive medication review performed. o Continue current medication management strategy o Discussed importance of medication dispensing continuity to reduce risk of error . Patient self care activities - Over the next 90 days, patient will: o Focus on medication adherence by filling and taking medications appropriately  o Take medications as prescribed o Report any questions or concerns to PharmD and/or provider(s)  Please see past updates related to this goal by clicking on the "Past Updates" button in the selected goal        Social Hx:  His wife is expecting a baby. January 3rd is due date. Wants to get healthy in preparation for the child. Has an older daughter and 3 grandchildren. Retired from salesPress photographeryears. Stopped working due to fear of dizziness.  Was in the [place] for a year.  Patient's Top 3 Health Goals 1. D/C amlodipine 2. Lower blood sugar 3. Lose weight  Hypertension   BP goal is:  <140/90  Office blood pressures are  BP Readings from Last 3 Encounters:  09/25/19 (!) 176/99  09/25/19 (!) 149/81  05/28/19 124/80   Patient checks BP at home 1-2x per week Patient home BP readings are ranging:  137 79 154 79 148 81 135 83 135 77 152 81 Avg 143.5 80  Patient has failed these meds in the past: None noted  Patient is currently uncontrolled on the following medications:  . Amlodipine 10mg 29my . Benazepril 40mg  81my . Chlorthalidone 25mg da30m(not taking, also not in dispense report) . HCTZ 25mg dai25mnot taking daily, takes every 2 days)   "Not fond of amlodipine"  Feels he has loss balance that he associated with start of amlodipine. Dizziness/loss of balance all throughout the [place]. No headaches or chest pain. [facility]22[facility]20 Has eliminated salt from diet. Watching sugar intake.  Has fallen twice due to dog tripping him.  Feels the hctz is drying him out so only takes it every 2 days.  Never started chlorthalidone. His top medical goal is to D/C amlodipine. He does not appreciate that it affects his "love life"  Explained that all BP meds have the potential to affect this area of his life as well uncontrolled hypertension itself.  Side Note: Afib listed in pt's chart, but pt states this was episodic and resolved. No medication therapy given for this.  We discussed diet and exercise extensively as well as the lifestyle modifications needed to help reduce the number of antihypertensives he takes. He is aware he needs to lose weight. Discussed his BP goal and how his home BP is above goal, so stopped amlodipine without an alternative would not be recommended. He is very hesitant about side effects of other BP meds, could pursue the chlorthalidone option Percell Miller proposed at his January visit.  Update 07/04/20 Will discuss options with Dr. Lorelei Pont   Plan -Continue checking BP 1-2 times per week. Can increase to 3-4 times if agreeable.  -Continue current medications  -Follow up with Dr. Lorelei Pont. Need updated labs (cmp).  Future Plan -Discuss alternatives for amlodipine and/or improve lifestyle to allow for D/C of amlodipine w/o replacement and controlled BP    Hyperlipidemia   LDL goal <100 TRIG goal <150  Lipid Panel     Component Value Date/Time   CHOL 179 05/28/2019 1110   TRIG (H) 05/28/2019 1110    529.0 Triglyceride is over 400; calculations on Lipids  are invalid.   HDL 28.20 (L) 05/28/2019 1110   LDLDIRECT 88.0 05/28/2019 1110    Hepatic Function Latest Ref Rng & Units 05/28/2019 03/31/2018 12/12/2017  Total Protein 6.0 - 8.3 g/dL 7.5 7.6 8.0  Albumin 3.5 - 5.2 g/dL 4.4 4.6 4.3  AST 0 - 37 U/L 20 26 61(H)  ALT 0 - 53 U/L 27 34 116(H)  Alk Phosphatase 39 - 117 U/L 40 39 42  Total Bilirubin 0.2 - 1.2 mg/dL 0.3 0.3 1.0  Bilirubin, Direct 0.1 - 0.5 mg/dL - - -     The 10-year ASCVD risk score Mikey Bussing DC Jr., et al., 2013) is: 48.3%   Values used to calculate the score:     Age: 80 years     Sex: Male     Is Non-Hispanic African American: No     Diabetic: Yes     Tobacco smoker: No     Systolic Blood Pressure: 196 mmHg     Is BP treated: Yes     HDL Cholesterol: 34 mg/dL     Total Cholesterol: 209 mg/dL   Patient has failed these meds in past: statins (has tried several and cannot tolerate - G72.0) Patient is currently uncontrolled for TRIG on the following medications:  . Aspirin 60m daily . Niaspan CR 5017mHS (insurance wouldn't cover; taking OTC 100061mS) . Salmon Oil 1000m47mice daily per [facility]  [place] reports severe cramping with statins [place] [facility] [place]  Encouraged patient to get updated labs at clinic and/or provide [facility] results  We discussed:  Lipid goals and risk of pancreatitis with elevated TRIG  Plan -Continue current medications  Diabetes   A1c goal <7%  Recent Relevant Labs: Lab Results  Component Value Date/Time   HGBA1C 7.3 (H) 05/28/2019 11:10 AM   HGBA1C 8.2 08/12/2018 12:00 AM   HGBA1C 6.8 (H) 03/31/2018 01:42 PM   GFR 58.58 (L) 05/28/2019 11:10 AM   GFR 57.05 (L) 03/31/2018 01:42 PM   MICROALBUR 1.1 03/31/2018 01:42 PM    Last diabetic Eye exam: No results found for: HMDIABEYEEXA  Last diabetic Foot exam: No results found for: HMDIABFOOTEX   Checking BG: Daily  Recent FBG Readings: 205 164 188 207 182 184 190 185 178 184 Avg 186.7  Patient has failed these  meds in past: metformin IR, metformin 1052m XR Patient is currently uncontrolled on the following medications: .Marland KitchenGlipizide 134mtwice daily . Ozempic 32m13meekly ([facility] from [facility])[place] Metformin XR 500m54mice daily (changed from 1000mg84men tried 1000mg 35mnd unable to tolerate)  Reports [facility] [place] with metformin XR 1000mg H68meen on the 32mg oze45mc x 1 month, did 0.5mg x 2 77mths  Diet Trying to do low carb diet as much as he can He is doing intermittent fasting (has x 2 years) B - eggs and bacon, sometimes with toast D - chicken or steak, brussel sprouts or broccoli Snacks - very little (pecans) Drink - water 99% of the time, coffee  Exercise Limited due to concerns with balance  We discussed: diet and exercise extensively and DM goals   Update 07/04/20 Follow up with Dr. Copland sLorelei Pontd  Future Plan Consider combo SGLT2/metformin.  Had urinary issues.   Plan -Continue current medications  -F/U with Dr. Copland fLorelei Pontted labs (a1c, ma/cr)   GERD   Patient has failed these meds in past: None noted  Patient is currently uncontrolled on the following medications:  . Famotidine 20mg twic8mily . Omeprazole 40mg daily18mbedtime  Breakthrough Sx: Daily Breakthrough Tx:  Triggers: None he can identify, tries to be careful with acid he eats  He takes omeprazole at bedtime. Feels he is having complications from gallbladder surgery. Oxbile? To replace bile; feels it helps  We discussed:  Take omeprazole in AM on empty stomach and take famotidine at bedtime. See if this resolves GERD symptoms  Update 07/04/20 Started PPI in morning and it was better  Plan -Take omeprazole in the AM on empty stomach vs at bedtime (last meal at 7pm, bedtime around 10pm, so this is not a completely empty stomach) -Take famotidine at bedtime   Vaccines   Reviewed and discussed patient's vaccination history.    Immunization History   Administered Date(s) Administered  . Fluad Quad(high Dose 65+) 05/28/2019, 07/06/2020  . Influenza, High Dose Seasonal PF 06/20/2016, 07/08/2017  . PFIZER SARS-COV-2 Vaccination 11/15/2019, 12/15/2019  . Pneumococcal Conjugate-13 06/20/2016  . Pneumococcal Polysaccharide-23 05/28/2019   Patient states [facility], but [place] specific dates. Requested that patient request vaccine records from [facility]. [place] listed in [facility].   Update 07/04/20 Received both Shingrix Vaccines  02/19/20 05/03/20  Plan  Recommended patient receive flu vaccine in pharmacy or office.   Future Plan -Update [facility] record once patient has vaccine dates for Shingrix.  Medication Management   [facility], [facility], [facility] Uses pill box? Yes does a month at a time Pt endorses 100% compliance  Discussed how filling medications at multiple pharmacies increases  risk of error.   Miscellaneous Meds  Acetylcarnitine 512m daily Alpha-Lipoic Acid 6063mdaily Multivitamin daily Turmeric Vitamin C  Meds to D/C from list Victoza Metformin IR 100016miaspan CR 500m56me discussed: Current pharmacy is preferred with insurance plan and patient is satisfied with pharmacy services  Plan  Continue current medication management strategy   Follow up: 3 month follow up visit  KaneMelvenia Beam, PharmD Clinical Pharmacist [facility]-[phone]

## 2020-07-04 NOTE — Telephone Encounter (Signed)
---  Caller states he called in this morning to get a prescription and when he called and just checked, the pharmacy didn't have any record of it. He is needing Extended Release Metformin, this is ordered by the [facility] and not this provider. Denies any sx for triage.   ---Caller will try and get a loaner dose. Due to it not being ordered by this office, the nurse is unable to assist. Caller advised that this medication issue will need to be taken care of during office hours. Advised that message will be sent to the office and that they should also follow up with the office when they open back up. Verbalizes understanding and denies further questions or concerns.

## 2020-07-04 NOTE — Telephone Encounter (Signed)
Resent rx. See previous encounter.

## 2020-07-05 NOTE — Progress Notes (Addendum)
[place] [facility] [place], [place], [place] [address] [phone]  Date:  07/06/2020   Name:  Timothy Chapman [facility]   DOB:  1946/07/03   MRN:  [id number]  PCP:  [name], MD    Chief Complaint: Diabetes (follow up, glucose reading issues) and Abdominal Pain (left lower abdominal/side pain, start months ago)   History of Present Illness:  Timothy Chapman is a 74 y.o. very pleasant male patient who presents with the following:  Patient here today for 25-month follow-up-he also does receive some of his care through the [place] History of diabetes, hypertension, chronic back pain, severe illness in 2019-he became ill with sepsis due to cholangitis, had acute respiratory failure and required intubation  Last seen by myself September 2020  He is married to a significantly younger woman who is currently expecting their first child in the next few months He also has a older daughter from previous marriage  He is having a lot of trouble with metformin causing diarrhea He is taking 1000 mg once a day He has less SE with extended release but still not great  He is using glipizide 10  Also ozempic 1 mg weekly  We discussed this today, I will check his A1c and we can make an adjustment as necessary  Lab Results  Component Value Date   HGBA1C 7.3 (H) 05/28/2019    taking cialis 5 mg daily and 10 mg prn  Eye exam- per the [place], he is being treated for ?early glaucoma with injections  Flu shot- give today Routine labs due today He is seeing urology, history of elevated PSA- this is done through the [place].  They are watching his PSA for now, talking about a bx vs MRI- he is thinking about these options He notes no PSA level in about 1 year, would like me to draw this for him today  He has noted some LLQ pain for 6 months or so- only if something presses on the area or pokes it  No bowel changes except for diarrhea as noted with  Metformin Patient Active Problem List   Diagnosis Date Noted  . 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 Paroxysmal atrial fibrillation ([place]) 12/16/2017  . SBO (small bowel obstruction) ([place]) 10/08/2017  . ATN (acute tubular necrosis) ([place]) 10/08/2017  . Septic shock ([place]) 10/04/2017  . Acute cholecystitis s/p perc cholecystomy drainage 10/04/2017   . Acute respiratory failure with hypoxia ([place])   . Chronic back pain 02/08/2016  . Controlled type 2 diabetes mellitus with diabetic neuropathy, without long-term current use of insulin ([place]) 02/08/2016  . Insomnia 02/08/2016  . History of diverticulitis 02/08/2016  . HYPERTENSION, BENIGN ESSENTIAL, UNCONTROLLED 03/12/2009  . ABNORMAL ELECTROCARDIOGRAM 03/12/2009    Past Medical History:  Diagnosis Date  . AKI (acute kidney injury) ([place])   . Anxiety   . Arthritis   . Barrett esophagus   . Depression   . Diabetes mellitus without complication ([place])    type 2  . 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 GERD (gastroesophageal reflux disease)   . Hypertension   . Neuromuscular disorder (HCC)    neuropathy but being treated  . PONV (postoperative nausea and vomiting)   . Sleep apnea    has a sleep study but refuses that he has sleep study, not wearing CPAP    Past Surgical History:  Procedure Laterality Date  . BACK SURGERY  X2 - lower  . CHOLECYSTECTOMY N/A 12/20/2017   Procedure: LAPAROSCOPIC CHOLECYSTECTOMY WITH INTRAOPERATIVE CHOLANGIOGRAM POSSIBLE OPEN;  Surgeon: Fanny Skates, MD;  Location: [facility];  Service: General;  Laterality: N/A;  . COLONOSCOPY    . ESOPHAGOGASTRODUODENOSCOPY    . IR RADIOLOGIST EVAL & MGMT  10/30/2017  . IR RADIOLOGIST EVAL & MGMT  11/14/2017  . PROSTATE SURGERY  2017   uro lift per pt.  . REPLACEMENT TOTAL KNEE Left   . ROTATOR CUFF REPAIR     Bil    Social History   Tobacco Use  . Smoking status: Former Smoker    Quit date: 09/18/1975    Years since quitting: 44.8  .  Smokeless tobacco: Never Used  Vaping Use  . Vaping Use: Never used  Substance Use Topics  . Alcohol use: Yes    Alcohol/week: 0.0 standard drinks    Comment: Rarely  . Drug use: Never    Family History  Problem Relation Age of Onset  . Heart failure Father   . Heart disease Father   . Emphysema Mother   . Colon cancer Maternal Grandmother     Allergies  Allergen Reactions  . Codeine     hallucinations  . Statins     Has tried several cannot tolerate    Medication list has been reviewed and updated.  Current Outpatient Medications on File Prior to Visit  Medication Sig Dispense Refill  . Acetylcarnitine HCl 500 MG CAPS Take 500 mg by mouth daily.    . Alcohol Swabs (B-D SINGLE USE SWABS REGULAR) PADS USE PRIOR TO INSULIN DOSE 200 each 7  . Alpha-Lipoic Acid 600 MG CAPS Take 600 mg by mouth daily.    Marland Kitchen amLODipine (NORVASC) 10 MG tablet TAKE 1 TABLET DAILY 90 tablet 3  . aspirin 81 MG chewable tablet Chew 1 tablet (81 mg total) by mouth daily. 30 tablet 0  . benazepril (LOTENSIN) 40 MG tablet TAKE 1 TABLET DAILY 90 tablet 3  . chlorthalidone (HYGROTON) 25 MG tablet Take 1 tablet (25 mg total) by mouth daily. 30 tablet 0  . diclofenac (VOLTAREN) 75 MG EC tablet TAKE 1 TABLET TWICE A DAY  AS NEEDED FOR JOINT PAINS 180 tablet 2  . famotidine (PEPCID) 20 MG tablet Take 1 tablet (20 mg total) by mouth 2 (two) times daily. 180 tablet 3  . glipiZIDE (GLUCOTROL) 10 MG tablet TAKE 1 TABLET TWICE A DAY  BEFORE A MEAL 180 tablet 3  . hydrochlorothiazide (HYDRODIURIL) 25 MG tablet TAKE 1 TABLET DAILY (Patient taking differently: Take 12.5 mg by mouth daily. ) 90 tablet 0  . Lancets (ONETOUCH DELICA PLUS [id number]) MISC USE TWO TIMES A DAY TO     CHECK BLOOD SUGAR 100 each 3  . Melatonin 3 MG TABS Take 5 mg by mouth at bedtime.     . metFORMIN (GLUCOPHAGE) 1000 MG tablet TAKE 1 TABLET TWICE DAILY  WITH MEALS 60 tablet 0  . Multiple Vitamin (MULTIVITAMIN) tablet Take 1 tablet by mouth  daily.    . niacin (NIASPAN) 1000 MG CR tablet Take 1,000 mg by mouth at bedtime.    Marland Kitchen omeprazole (PRILOSEC) 40 MG capsule Take 1 capsule (40 mg total) by mouth daily. 90 capsule 1  . ONETOUCH VERIO test strip USE AS INSTRUCTED 100 strip 7  . Semaglutide, 1 MG/DOSE, (OZEMPIC, 1 MG/DOSE,) 2 MG/1.5ML SOPN Inject 1 mg into the skin once a week.    . tadalafil (CIALIS) 20 MG tablet Take  1 tablet (20 mg total) by mouth daily as needed for erectile dysfunction. Max 20 mg total per day 30 tablet 3  . tadalafil (CIALIS) 5 MG tablet TAKE 1 TABLET BY MOUTH DAILY 90 tablet 2  . Turmeric 450 MG CAPS Take 1 tablet by mouth daily.     . vitamin C (ASCORBIC ACID) 500 MG tablet Take 500 mg by mouth daily.     No current facility-administered medications on file prior to visit.    Review of Systems:  As per HPI- otherwise negative.   Physical Examination: Vitals:   07/06/20 1422  BP: 136/84  Pulse: 86  Resp: 17  SpO2: 94%   Vitals:   07/06/20 1422  Weight: 253 lb (114.8 kg)  Height: 5\' 9"  (1.753 m)   Body mass index is 37.36 kg/m. Ideal Body Weight: Weight in (lb) to have BMI = 25: 168.9  GEN: no acute distress.  Obese, otherwise looks well HEENT: Atraumatic, Normocephalic.  Ears and Nose: No external deformity. CV: RRR, No M/G/R. No JVD. No thrill. No extra heart sounds. PULM: CTA B, no wheezes, crackles, rhonchi. No retractions. No resp. distress. No accessory muscle use. ABD: S, NT, ND, +BS. No rebound. No HSM.  Belly is benign on exam EXTR: No c/c/e PSYCH: Normally interactive. Conversant.    Assessment and Plan: Controlled type 2 diabetes mellitus with diabetic neuropathy, without long-term current use of insulin ([place]) - Plan: Hemoglobin A1c  Essential hypertension - Plan: CBC, Comprehensive metabolic panel  Dyslipidemia - Plan: Lipid panel  Elevated PSA - Plan: PSA  Gastroesophageal reflux disease - Plan: famotidine (PEPCID) 20 MG tablet  Erectile dysfunction, unspecified  erectile dysfunction type - Plan: tadalafil (CIALIS) 10 MG tablet  Needs flu shot - Plan: Flu Vaccine QUAD High Dose(Fluad)  Flu vaccine given Follow-up of diabetes, hyperlipidemia, PSA today Patient takes Cialis 5 mg daily an additional 10 mg as needed for sexual activity.  Refilled his 10 mg today Will plan further follow- up pending labs. Pt really cannot tolerate current dose of metfomrin- need to make a new plan when labs come in   This visit occurred during the SARS-CoV-2 public health emergency.  Safety protocols were in place, including screening questions prior to the visit, additional usage of staff PPE, and extensive cleaning of exam room while observing appropriate contact time as indicated for disinfecting solutions.    Signed Timothy Blinks, MD  Addendum 10/21, received labs as below.  Message to patient Currently taking glipizide 10 twice a day, Metformin at 1000 mg once daily-still having significant GI side effects with once daily Metformin Also taking Ozempic 1 mg Results for orders placed or performed in visit on 07/06/20  Hemoglobin A1c  Result Value Ref Range   Hgb A1c MFr Bld 7.8 (H) <5.7 % of total Hgb   Mean Plasma Glucose 177 (calc)   eAG (mmol/L) 9.8 (calc)  CBC  Result Value Ref Range   WBC 8.9 3.8 - 10.8 Thousand/uL   RBC 4.73 4.20 - 5.80 Million/uL   Hemoglobin 13.3 13.2 - 17.1 g/dL   HCT 40.7 38 - 50 %   MCV 86.0 80.0 - 100.0 fL   MCH 28.1 27.0 - 33.0 pg   MCHC 32.7 32.0 - 36.0 g/dL   RDW 13.1 11.0 - 15.0 %   Platelets 247 140 - 400 Thousand/uL   MPV 12.3 7.5 - 12.5 fL  Comprehensive metabolic panel  Result Value Ref Range   Glucose, Bld 108 (H) 65 -  99 mg/dL   BUN 24 7 - 25 mg/dL   Creat 1.20 (H) 0.70 - 1.18 mg/dL   BUN/Creatinine Ratio 20 6 - 22 (calc)   Sodium 136 135 - 146 mmol/L   Potassium 4.6 3.5 - 5.3 mmol/L   Chloride 101 98 - 110 mmol/L   CO2 21 20 - 32 mmol/L   Calcium 10.4 (H) 8.6 - 10.3 mg/dL   Total Protein 7.8 6.1 - 8.1  g/dL   Albumin 4.7 3.6 - 5.1 g/dL   Globulin 3.1 1.9 - 3.7 g/dL (calc)   AG Ratio 1.5 1.0 - 2.5 (calc)   Total Bilirubin 0.4 0.2 - 1.2 mg/dL   Alkaline phosphatase (APISO) 35 35 - 144 U/L   AST 35 10 - 35 U/L   ALT 70 (H) 9 - 46 U/L  Lipid panel  Result Value Ref Range   Cholesterol 209 (H) <200 mg/dL   HDL 34 (L) > OR = 40 mg/dL   Triglycerides 422 (H) <150 mg/dL   LDL Cholesterol (Calc)  mg/dL (calc)   Total CHOL/HDL Ratio 6.1 (H) <5.0 (calc)   Non-HDL Cholesterol (Calc) 175 (H) <130 mg/dL (calc)  PSA  Result Value Ref Range   PSA 8.02 (H) < OR = 4.0 ng/mL    Sent message to patient, await his response  Blood counts look okay Metabolic profile shows a couple of minor abnormalities.  Mild elevation of ALT-we will monitor, may be due to fatty liver Your creatinine-kidney function-shows improvement Minimally elevated calcium is probably fine, we will repeat at next lab draw  Your cholesterol is not where we would like it.  I know you have not been able to tolerate statins in the past.  A relatively new class of cholesterol medications, PCSK9 inhibitors, can be useful for someone in your situation.  These are actually given by at home injection every 2 weeks.  I would like to get her cholesterol lower in hopes of reducing your risk of heart disease and stroke.  Would you be interested in trying a PCS9?  Repatha and Praluent either to brand names if you wish to read more about them  Your PSA has gone up-please follow-up with your urologist at the [place] as soon as possible!   Finally, I am afraid your A1c is still a bit above goal.  It is not terrible, but I would like to see it closer to 7%  I would suggest changing your Metformin to 500 mg ER once a day-hopefully this will reduce your GI symptoms We could then increase your glipizide to a total of 20 mg twice a day.  Would you be okay with this plan?

## 2020-07-06 ENCOUNTER — Ambulatory Visit (INDEPENDENT_AMBULATORY_CARE_PROVIDER_SITE_OTHER): Payer: Medicare HMO | Admitting: Family Medicine

## 2020-07-06 ENCOUNTER — Other Ambulatory Visit: Payer: Self-pay

## 2020-07-06 ENCOUNTER — Telehealth: Payer: Self-pay

## 2020-07-06 ENCOUNTER — Encounter: Payer: Self-pay | Admitting: Family Medicine

## 2020-07-06 VITALS — BP 136/84 | HR 86 | Resp 17 | Ht 69.0 in | Wt 253.0 lb

## 2020-07-06 DIAGNOSIS — I10 Essential (primary) hypertension: Secondary | ICD-10-CM

## 2020-07-06 DIAGNOSIS — N529 Male erectile dysfunction, unspecified: Secondary | ICD-10-CM | POA: Diagnosis not present

## 2020-07-06 DIAGNOSIS — R972 Elevated prostate specific antigen [PSA]: Secondary | ICD-10-CM

## 2020-07-06 DIAGNOSIS — E785 Hyperlipidemia, unspecified: Secondary | ICD-10-CM | POA: Diagnosis not present

## 2020-07-06 DIAGNOSIS — Z23 Encounter for immunization: Secondary | ICD-10-CM

## 2020-07-06 DIAGNOSIS — K219 Gastro-esophageal reflux disease without esophagitis: Secondary | ICD-10-CM

## 2020-07-06 DIAGNOSIS — E1140 Type 2 diabetes mellitus with diabetic neuropathy, unspecified: Secondary | ICD-10-CM

## 2020-07-06 MED ORDER — TADALAFIL 10 MG PO TABS: 10.0000 mg | ORAL_TABLET | Freq: Every day | ORAL | 2 refills | Status: DC | PRN

## 2020-07-06 MED ORDER — FAMOTIDINE 20 MG PO TABS: 20.0000 mg | ORAL_TABLET | Freq: Two times a day (BID) | ORAL | 3 refills | Status: DC

## 2020-07-06 NOTE — Patient Instructions (Signed)
Good to see you again today!  You got your flu shot today  I will be in touch with your labs asap- we will figure out a plan as far as your diabetes medication since you are really not able to take a higher dose of metformin

## 2020-07-06 NOTE — Telephone Encounter (Signed)
PA initiated via Covermymeds; KEY: [id number]. PA cancelled. Not covered by Medicare Part D law.

## 2020-07-07 ENCOUNTER — Encounter: Payer: Self-pay | Admitting: Family Medicine

## 2020-07-07 DIAGNOSIS — N529 Male erectile dysfunction, unspecified: Secondary | ICD-10-CM

## 2020-07-07 LAB — LIPID PANEL
Cholesterol: 209 mg/dL — ABNORMAL HIGH (ref <200)
HDL: 34 mg/dL — ABNORMAL LOW (ref >=40)
Non-HDL Cholesterol (Calc): 175 mg/dL — ABNORMAL HIGH (ref <130)
Total CHOL/HDL Ratio: 6.1 (calc) — ABNORMAL HIGH (ref <5.0)
Triglycerides: 422 mg/dL — ABNORMAL HIGH (ref <150)

## 2020-07-07 LAB — CBC
HCT: 40.7 % (ref 38.5–50.0)
Hemoglobin: 13.3 g/dL (ref 13.2–17.1)
MCH: 28.1 pg (ref 27.0–33.0)
MCHC: 32.7 g/dL (ref 32.0–36.0)
MCV: 86 fL (ref 80.0–100.0)
MPV: 12.3 fL (ref 7.5–12.5)
Platelets: 247 10*3/uL (ref 140–400)
RBC: 4.73 10*6/uL (ref 4.20–5.80)
RDW: 13.1 % (ref 11.0–15.0)
WBC: 8.9 10*3/uL (ref 3.8–10.8)

## 2020-07-07 LAB — COMPREHENSIVE METABOLIC PANEL
AG Ratio: 1.5 (calc) (ref 1.0–2.5)
ALT: 70 U/L — ABNORMAL HIGH (ref 9–46)
AST: 35 U/L (ref 10–35)
Albumin: 4.7 g/dL (ref 3.6–5.1)
Alkaline phosphatase (APISO): 35 U/L (ref 35–144)
BUN/Creatinine Ratio: 20 (calc) (ref 6–22)
BUN: 24 mg/dL (ref 7–25)
CO2: 21 mmol/L (ref 20–32)
Calcium: 10.4 mg/dL — ABNORMAL HIGH (ref 8.6–10.3)
Chloride: 101 mmol/L (ref 98–110)
Creat: 1.2 mg/dL — ABNORMAL HIGH (ref 0.70–1.18)
Globulin: 3.1 g/dL (calc) (ref 1.9–3.7)
Glucose, Bld: 108 mg/dL — ABNORMAL HIGH (ref 65–99)
Potassium: 4.6 mmol/L (ref 3.5–5.3)
Sodium: 136 mmol/L (ref 135–146)
Total Bilirubin: 0.4 mg/dL (ref 0.2–1.2)
Total Protein: 7.8 g/dL (ref 6.1–8.1)

## 2020-07-07 LAB — PSA: PSA: 8.02 ng/mL — ABNORMAL HIGH (ref <=4.0)

## 2020-07-07 LAB — HEMOGLOBIN A1C
Hgb A1c MFr Bld: 7.8 % of total Hgb — ABNORMAL HIGH (ref <5.7)
Mean Plasma Glucose: 177 (calc)
eAG (mmol/L): 9.8 (calc)

## 2020-07-08 ENCOUNTER — Other Ambulatory Visit: Payer: Self-pay | Admitting: Family Medicine

## 2020-07-08 MED ORDER — REPATHA SURECLICK 140 MG/ML ~~LOC~~ SOAJ: 140.0000 mg | SUBCUTANEOUS | 3 refills | Status: DC

## 2020-07-08 MED ORDER — TADALAFIL 10 MG PO TABS: 10.0000 mg | ORAL_TABLET | Freq: Every day | ORAL | 2 refills | Status: DC | PRN

## 2020-07-08 MED FILL — TADALAFIL 10 MG TABS: 10 | 5 days supply | Qty: 5 | Fill #0

## 2020-07-11 ENCOUNTER — Telehealth: Payer: Self-pay

## 2020-07-11 ENCOUNTER — Encounter: Payer: Self-pay | Admitting: Family Medicine

## 2020-07-11 DIAGNOSIS — E785 Hyperlipidemia, unspecified: Secondary | ICD-10-CM

## 2020-07-11 MED ORDER — PRALUENT 75 MG/ML ~~LOC~~ SOAJ: 75.0000 mg | SUBCUTANEOUS | 6 refills | Status: DC

## 2020-07-11 NOTE — Telephone Encounter (Signed)
Repatha not covered by insurance. Preferred is Praluent.

## 2020-07-12 NOTE — Telephone Encounter (Signed)
Received approval from insurance. Approval from 09/18/2019 - 09/16/2020

## 2020-07-12 NOTE — Telephone Encounter (Signed)
Pharmacy called requiring PA on medication.  Initiated PA on cover my meds. KEY: [id number].   Waiting on response. Sent patient mychart message.

## 2020-07-12 NOTE — Telephone Encounter (Signed)
Noted, thanks!

## 2020-07-17 NOTE — Patient Instructions (Signed)
Visit Information  Goals Addressed            This Visit's Progress   . Chronic Care Management Pharmacy Care Plan       CARE PLAN ENTRY (see longitudinal plan of care for additional care plan information)  Current Barriers:  . Chronic Disease Management support, education, and care coordination needs related to Hypertension, Hyperlipidemia, Diabetes, Insomnia, GERD, Pain, BPH/ED   Hypertension BP Readings from Last 3 Encounters:  09/25/19 (!) 176/99  09/25/19 (!) 149/81  05/28/19 124/80   . Pharmacist Clinical Goal(s): o Over the next 90 days, patient will work with PharmD and providers to achieve BP goal <140/90 . Current regimen:  . Amlodipine 10mg  daily . Benazepril 40mg  daily . HCTZ 25mg  daily (taking every 2 days) . Interventions: o Discussed BP goal o Discussed diet and exercise o Discussed balance of controlling BP and preventing medication side effects . Patient self care activities - Over the next 90 days, patient will: o Check BP 1-3 times per week, document, and provide at future appointments o Ensure daily salt intake < 2300 mg/Timothy Chapman o Follow up with neurology to see if there is a cause of dizziness outside of amlodipine  Hyperlipidemia    Component Value Date/Time   CHOL 179 05/28/2019 1110   TRIG (H) 05/28/2019 1110    529.0 Triglyceride is over 400; calculations on Lipids are invalid.   HDL 28.20 (L) 05/28/2019 1110   LDLDIRECT 88.0   . Pharmacist Clinical Goal(s): o Over the next 180 days, patient will work with PharmD and providers to maintain LDL goal < 100, achieve goal of TRIG <150 . Current regimen:  . Aspirin 81mg  daily . Niaspan CR 1000mg  HS . Salmon Oil 1000mg  twice daily per [place] . Interventions: o Discussed lipid goals and risk associated with elevated TRIG . Patient self care activities - Over the next 180 days, patient will: o Maintain cholesterol medication regimen.   Diabetes Lab Results  Component Value Date/Time   HGBA1C 7.3 (H)  05/28/2019 11:10 AM   HGBA1C 8.2 08/12/2018 12:00 AM   HGBA1C 6.8 (H) 03/31/2018 01:42 PM   GFR 58.58 (L) 05/28/2019 11:10 AM   GFR 57.05 (L) 03/31/2018 01:42 PM   MICROALBUR 1.1 03/31/2018 01:42 PM     . Pharmacist Clinical Goal(s): o Over the next 90 days, patient will work with PharmD and providers to achieve A1c goal <7% . Current regimen:  Marland Kitchen Glipizide 10mg  twice daily . Ozempic 1mg  weekly  . Metformin XR 500mg  twice daily . Interventions: o Discussed diet and exercise o Discussed DM goals . Patient self care activities - Over the next 90 days, patient will: o Check blood sugar once daily, document, and provide at future appointments o Contact provider with any episodes of hypoglycemia  GERD . Pharmacist Clinical Goal(s) o Over the next 90 days, patient will work with PharmD and providers to reduce symptoms associated with GERD . Current regimen:  . Famotidine 20mg  twice daily . Omeprazole 40mg  daily at bedtime . Interventions: o Recommended patient take omeprazole in AM on empty stomach and take famotidine at bedtime . Patient self care activities - Over the next 90 days, patient will: o Take omeprazole in AM on empty stomach and take famotidine at bedtime  Health Maintenance  . Pharmacist Clinical Goal(s) o Over the next 90 days, patient will work with PharmD and providers to complete health maintenance screenings/vaccinations . Interventions: o Recommended patient receive flu vaccine o Discussed Shingrix vaccines (pt  reports completed series at [facility]) . Patient self care activities - Over the next 90 days, patient will: o Provide vaccine dates for Shingrix series o Receive flu vaccine    Medication management . Pharmacist Clinical Goal(s): o Over the next 90 days, patient will work with PharmD and providers to maintain optimal medication adherence . Current pharmacy: [facility] . Interventions o Comprehensive medication review performed. o Continue current  medication management strategy o Discussed importance of medication dispensing continuity to reduce risk of error . Patient self care activities - Over the next 90 days, patient will: o Focus on medication adherence by filling and taking medications appropriately  o Take medications as prescribed o Report any questions or concerns to PharmD and/or provider(s)  Please see past updates related to this goal by clicking on the "Past Updates" button in the selected goal         Patient verbalizes understanding of instructions provided today.   Telephone follow up appointment with pharmacy team member scheduled for: 10/05/2020  Melvenia Beam Darrah Dredge, PharmD Clinical Pharmacist [facility] at [facility] [phone]

## 2020-07-26 ENCOUNTER — Other Ambulatory Visit: Payer: Self-pay

## 2020-07-26 ENCOUNTER — Telehealth: Payer: Self-pay | Admitting: Pharmacist

## 2020-07-26 ENCOUNTER — Encounter: Payer: Self-pay | Admitting: Family Medicine

## 2020-07-26 DIAGNOSIS — E1140 Type 2 diabetes mellitus with diabetic neuropathy, unspecified: Secondary | ICD-10-CM

## 2020-07-26 DIAGNOSIS — I10 Essential (primary) hypertension: Secondary | ICD-10-CM

## 2020-07-26 DIAGNOSIS — K219 Gastro-esophageal reflux disease without esophagitis: Secondary | ICD-10-CM

## 2020-07-26 MED ORDER — OMEPRAZOLE 40 MG PO CPDR: 40.0000 mg | DELAYED_RELEASE_CAPSULE | Freq: Every day | ORAL | 1 refills | Status: DC

## 2020-07-26 MED ORDER — DICLOFENAC SODIUM 75 MG PO TBEC: DELAYED_RELEASE_TABLET | ORAL | 2 refills | Status: DC

## 2020-07-26 MED ORDER — GLIPIZIDE 10 MG PO TABS: ORAL_TABLET | ORAL | 3 refills | Status: DC

## 2020-07-26 MED ORDER — BENAZEPRIL HCL 40 MG PO TABS: 40.0000 mg | ORAL_TABLET | Freq: Every day | ORAL | 3 refills | Status: DC

## 2020-07-26 MED ORDER — HYDROCHLOROTHIAZIDE 25 MG PO TABS: 12.5000 mg | ORAL_TABLET | Freq: Every day | ORAL | 3 refills | Status: DC

## 2020-07-26 NOTE — Progress Notes (Addendum)
Chronic Care Management Pharmacy Assistant   Name: Timothy Chapman  MRN: [id number] DOB: Feb 11, 1946  Reason for Encounter: Disease State  Patient Questions:  1.  Have you seen any other providers since your last visit? No  2.  Any changes in your medicines or health? No  PCP : Copland, Gay Filler, MD   Their chronic conditions include: Hypertension, Hyperlipidemia, Diabetes, Insomnia, GERD, Pain, BPH/ED  Office visit: 07-11-20(PCP) Kaylyn Canter confirmed Repatha was not covered by insurance.  Evlocumab was discontinued. Alirocumab was prescribed. 07-06-20(PCP) patient presented in office with Jessica Copland for a follow up.  Labs ordered. Provider changed metformin 500 mg at bedtime to 1000 mg tabs twice daily, Niacin 500 mg at bedtime to 1000 at bedtime, Tadalafil 5 mg changed to 10 mg daily prn.  Provider discontinued Liraglutide 18 mg/3ML.  Flu shot given  No Consults or Hospitalizations since last CCM visit on 07-04-20.  Allergies:   Allergies  Allergen Reactions   Codeine     hallucinations   Statins     Has tried several cannot tolerate    Medications: Outpatient Encounter Medications as of 07/26/2020  Medication Sig Note   Acetylcarnitine HCl 500 MG CAPS Take 500 mg by mouth daily.    Alcohol Swabs (B-D SINGLE USE SWABS REGULAR) PADS USE PRIOR TO INSULIN DOSE    Alirocumab (PRALUENT) 75 MG/ML SOAJ Inject 75 mg into the skin every 14 (fourteen) days.    Alpha-Lipoic Acid 600 MG CAPS Take 600 mg by mouth daily. 11/21/2017: ON HOLD   amLODipine (NORVASC) 10 MG tablet TAKE 1 TABLET DAILY    aspirin 81 MG chewable tablet Chew 1 tablet (81 mg total) by mouth daily.    benazepril (LOTENSIN) 40 MG tablet TAKE 1 TABLET DAILY    chlorthalidone (HYGROTON) 25 MG tablet Take 1 tablet (25 mg total) by mouth daily. 06/20/2020: Reports he never took this   diclofenac (VOLTAREN) 75 MG EC tablet TAKE 1 TABLET TWICE A DAY  AS NEEDED FOR JOINT PAINS    famotidine (PEPCID) 20 MG  tablet Take 1 tablet (20 mg total) by mouth 2 (two) times daily.    glipiZIDE (GLUCOTROL) 10 MG tablet TAKE 1 TABLET TWICE A DAY  BEFORE A MEAL    hydrochlorothiazide (HYDRODIURIL) 25 MG tablet TAKE 1 TABLET DAILY (Patient taking differently: Take 12.5 mg by mouth daily. ) 06/20/2020: Taking every 2 days   Lancets (ONETOUCH DELICA PLUS [id number]) MISC USE TWO TIMES A DAY TO     CHECK BLOOD SUGAR    Melatonin 3 MG TABS Take 5 mg by mouth at bedtime.     metFORMIN (GLUCOPHAGE) 1000 MG tablet TAKE 1 TABLET TWICE DAILY  WITH MEALS    Multiple Vitamin (MULTIVITAMIN) tablet Take 1 tablet by mouth daily.    niacin (NIASPAN) 1000 MG CR tablet Take 1,000 mg by mouth at bedtime.    omeprazole (PRILOSEC) 40 MG capsule Take 1 capsule (40 mg total) by mouth daily.    ONETOUCH VERIO test strip USE AS INSTRUCTED    Semaglutide, 1 MG/DOSE, (OZEMPIC, 1 MG/DOSE,) 2 MG/1.5ML SOPN Inject 1 mg into the skin once a week.    tadalafil (CIALIS) 10 MG tablet Take 1 tablet (10 mg total) by mouth daily as needed for erectile dysfunction. Max 20 mg total per day    tadalafil (CIALIS) 5 MG tablet TAKE 1 TABLET BY MOUTH DAILY    Turmeric 450 MG CAPS Take 1 tablet by mouth daily.  vitamin C (ASCORBIC ACID) 500 MG tablet Take 500 mg by mouth daily.    No facility-administered encounter medications on file as of 07/26/2020.    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   Paroxysmal atrial fibrillation ([place]) 12/16/2017   SBO (small bowel obstruction) ([place]) 10/08/2017   ATN (acute tubular necrosis) ([place]) 10/08/2017   Septic shock ([place]) 10/04/2017   Acute cholecystitis s/p perc cholecystomy drainage 10/04/2017    Acute respiratory failure with hypoxia (HCC)    Chronic back pain 02/08/2016   Controlled type 2 diabetes mellitus with diabetic neuropathy, without long-term current use of insulin ([place]) 02/08/2016   Insomnia 02/08/2016   History of diverticulitis  02/08/2016   HYPERTENSION, BENIGN ESSENTIAL, UNCONTROLLED 03/12/2009   ABNORMAL ELECTROCARDIOGRAM 03/12/2009    Goals Addressed   None    Reviewed chart prior to disease state call. Spoke with patient regarding BP  Recent Office Vitals: BP Readings from Last 3 Encounters:  07/06/20 136/84  09/25/19 (!) 176/99  09/25/19 (!) 149/81   Pulse Readings from Last 3 Encounters:  07/06/20 86  09/25/19 74  05/28/19 74    Wt Readings from Last 3 Encounters:  07/06/20 253 lb (114.8 kg)  09/25/19 251 lb (113.9 kg)  05/28/19 254 lb (115.2 kg)     Kidney Function Lab Results  Component Value Date/Time   CREATININE 1.20 (H) 07/06/2020 02:48 PM   CREATININE 1.22 05/28/2019 11:10 AM   CREATININE 1.32 03/31/2018 01:42 PM   CREATININE 1.21 11/08/2017 01:33 PM   GFR 58.58 (L) 05/28/2019 11:10 AM   GFRNONAA 59 (L) 12/12/2017 01:16 PM   GFRAA >60 12/12/2017 01:16 PM    BMP Latest Ref Rng & Units 07/06/2020 05/28/2019 08/12/2018  Glucose 65 - 99 mg/dL 108(H) 116(H) -  BUN 7 - 25 mg/dL 24 31(H) -  Creatinine 0.70 - 1.18 mg/dL 1.20(H) 1.22 -  BUN/Creat Ratio 6 - 22 (calc) 20 - -  Sodium 135 - 146 mmol/L 136 137 137  Potassium 3.5 - 5.3 mmol/L 4.6 4.9 4.5  Chloride 98 - 110 mmol/L 101 105 -  CO2 20 - 32 mmol/L 21 24 -  Calcium 8.6 - 10.3 mg/dL 10.4(H) 9.4 -    Current antihypertensive regimen:  Amlodipine 10mg   daily Benazepril 40mg  daily HCTZ 12.5mg  daily  How oftre you checking your Blood Pressure? 3-5x per week Current home BP readings: 137/81 on 07-25-20 What recent interventions/DTPs have been made by any provider to improve Blood Pressure control since last CPP Visit: None Any recent hospitalizations or ED visits since last visit with CPP? No What diet changes have been made to improve Blood Pressure Control?  Reports he does eat healthy What exercise is being done to improve your Blood Pressure Control?  Reports exercising every other day for an hour.  Adherence Review: Is  the patient currently on ACE/ARB medication? Yes Benazepril 40 mg Does the patient have >5 day gap between last estimated fill dates? Yes    Follow-Up:  Pharmacist Review   [place], [facility] [phone]  Noted >5 day gap history per dispense report. Need to assure adherence.  Fill Dates Per Dispense Report   Dispensed Days Supply Quantity Provider Pharmacy  BENAZEPRIL   TAB 53M 06/10/2020 90 90 tablet  [facility]  BENAZEPRIL   TAB 40MG  02/17/2020 90 90 tablet  [facility] ...   Reviewed by: De Blanch, PharmD  Clinical Pharmacist [facility] at [facility] [phone]

## 2020-08-03 ENCOUNTER — Telehealth: Payer: Self-pay

## 2020-08-03 NOTE — Telephone Encounter (Signed)
Timothy Chapman, patient needs to sign release of records form prior to sending ov notes and labs.

## 2020-08-03 NOTE — Telephone Encounter (Signed)
Barnett Applebaum from [facility] called requesting notes and lab results from pts most recent office visit. Please fax to [phone]  Informed Barnett Applebaum that pt may need to sign release of information form at our office to release those records. Barnett Applebaum will inform pt. Please advise.

## 2020-08-07 DIAGNOSIS — R69 Illness, unspecified: Secondary | ICD-10-CM | POA: Diagnosis not present

## 2020-09-09 ENCOUNTER — Other Ambulatory Visit: Payer: Self-pay | Admitting: Family Medicine

## 2020-09-09 DIAGNOSIS — N401 Enlarged prostate with lower urinary tract symptoms: Secondary | ICD-10-CM

## 2020-09-09 DIAGNOSIS — N529 Male erectile dysfunction, unspecified: Secondary | ICD-10-CM

## 2020-09-29 ENCOUNTER — Ambulatory Visit: Payer: Medicare HMO | Admitting: Pharmacist

## 2020-09-29 DIAGNOSIS — I10 Essential (primary) hypertension: Secondary | ICD-10-CM

## 2020-09-29 DIAGNOSIS — E1140 Type 2 diabetes mellitus with diabetic neuropathy, unspecified: Secondary | ICD-10-CM

## 2020-09-29 NOTE — Patient Instructions (Addendum)
Visit Information  Goals Addressed            This Visit's Progress   . Chronic Care Management Pharmacy Care Plan       CARE PLAN ENTRY (see longitudinal plan of care for additional care plan information)  Current Barriers:  . Chronic Disease Management support, education, and care coordination needs related to Hypertension, Hyperlipidemia, Diabetes, Insomnia, GERD, Pain, BPH/ED   Hypertension BP Readings from Last 3 Encounters:  07/06/20 136/84  09/25/19 (!) 176/99  09/25/19 (!) 149/81   . Pharmacist Clinical Goal(s): o Over the next 90 days, patient will work with PharmD and providers to achieve BP goal <140/90 . Current regimen:  . Amlodipine 10mg  daily . Benazepril 40mg  daily . HCTZ 25mg  daily  . Interventions: o Discussed BP goal o Discussed diet and exercise o Discussed balance of controlling BP and preventing medication side effects . Patient self care activities - Over the next 90 days, patient will: o Check BP 1-3 times per week, document, and provide at future appointments o Ensure daily salt intake < 2300 mg/Keghan Mcfarren o Follow up with neurology to see if there is a cause of dizziness outside of amlodipine  Hyperlipidemia Lab Results  Component Value Date/Time   [facility]  07/06/2020 02:48 PM     Comment:     . LDL cholesterol not calculated. Triglyceride levels greater than 400 mg/dL invalidate calculated LDL results. . Reference range: <100 . Desirable range <100 mg/dL for primary prevention;   <70 mg/dL for patients with CHD or diabetic patients  with > or = 2 CHD risk factors. Marland Kitchen LDL-C is now calculated using the Martin-Hopkins  calculation, which is a validated novel method providing  better accuracy than the Friedewald equation in the  estimation of LDL-C.  Cresenciano Genre et al. Annamaria Helling. 4782;956(21): 2061-2068  (http://education.[facility].com/faq/FAQ164)    LDLDIRECT 88.0 05/28/2019 11:10 AM   . Pharmacist Clinical Goal(s): o Over the next 90 days,  patient will work with PharmD and providers to achieve LDL goal < 100 . Current regimen:  . Aspirin 81mg  daily . Niaspan CR 1000mg  HS  . Salmon Oil 1000mg  twice daily per [place] . Praluent 75mg  every 14 days . Interventions: o Discussed lipid goals and risk associated with elevated TRIG . Patient self care activities - Over the next 90 days, patient will: o Maintain cholesterol medication regimen.   Diabetes Lab Results  Component Value Date/Time   HGBA1C 7.8 (H) 07/06/2020 02:48 PM   HGBA1C 7.3 (H) 05/28/2019 11:10 AM   HGBA1C 8.2 08/12/2018 12:00 AM   . Pharmacist Clinical Goal(s): o Over the next 90 days, patient will work with PharmD and providers to achieve A1c goal <7% . Current regimen:  Marland Kitchen Glipizide 10mg  #1.5 tabs twice daily . Ozempic 1mg  weekly  . Metformin XR 500mg  #2 twice daily  . Interventions: o Discussed DM goal o Discussed medication options  o Collaboration with provider regarding medication management (consider Xigduo XR 5/500mg  twice daily)  . Patient self care activities - Over the next 90 days, patient will: o Check blood sugar once daily, document, and provide at future appointments o Contact provider with any episodes of hypoglycemia  Osteoarthritis . Pharmacist Clinical Goal(s) o Over the next 90 days, patient will work with PharmD and providers to reduce symptoms associated with arthritis . Current regimen:  o Diclofenac 1% gel twice daily . Interventions: o Discussed using Tylenol 650mg  #1-2 tabs three times daily for arthritis pain . Patient self care activities -  Over the next 90 days, patient will: o Maintain medication regimen for arthritis    GERD . Pharmacist Clinical Goal(s) o Over the next 90 days, patient will work with PharmD and providers to reduce symptoms associated with GERD . Current regimen:  . Famotidine 20mg  twice daily . Omeprazole 40mg  daily at bedtime . Interventions: o Recommended patient take omeprazole in AM on empty  stomach and take famotidine at bedtime . Patient self care activities - Over the next 90 days, patient will: o Take omeprazole in AM on empty stomach and take famotidine at bedtime  Health Maintenance  . Pharmacist Clinical Goal(s) o Over the next 90 days, patient will work with PharmD and providers to complete health maintenance screenings/vaccinations . Interventions: o Recommended patient receive Td booster . Patient self care activities - Over the next 90 days, patient will: o Receive Td booster vaccine   Medication management . Pharmacist Clinical Goal(s): o Over the next 90 days, patient will work with PharmD and providers to maintain optimal medication adherence . Current pharmacy: [facility] . Interventions o Comprehensive medication review performed. o Continue current medication management strategy o Discussed importance of medication dispensing continuity to reduce risk of error . Patient self care activities - Over the next 90 days, patient will: o Focus on medication adherence by filling and taking medications appropriately  o Take medications as prescribed o Report any questions or concerns to PharmD and/or provider(s)  Please see past updates related to this goal by clicking on the "Past Updates" button in the selected goal         The patient verbalized understanding of instructions, educational materials, and care plan provided today and agreed to receive a mailed copy of patient instructions, educational materials, and care plan.   Telephone follow up appointment with pharmacy team member scheduled for: 12/28/2020  Melvenia Beam Draylon Mercadel, [facility]

## 2020-09-29 NOTE — Chronic Care Management (AMB) (Signed)
Chronic Care Management Pharmacy  Name: Timothy Chapman  MRN: [id number] DOB: 1946/05/20  Chief Complaint/ HPI  Timothy Chapman,  75 y.o. , male presents for their Follow-Up CCM visit with the clinical pharmacist via telephone due to COVID-19 Pandemic.  PCP : Timothy Mclean, MD  Their chronic conditions include: Hypertension, Hyperlipidemia, Diabetes, Insomnia, GERD, Pain, BPH/ED  Office Visits: 07/12/20: Message to patient that Praluent is approved at the pharmacy  07-06-20: Visit w/ Dr. Lorelei Chapman -  patient presented in office with Timothy Chapman for a follow up.  Labs ordered. Provider changed metformin 500 mg at bedtime to 1000 mg tabs twice daily, Niacin 500 mg at bedtime to 1000 at bedtime, Tadalafil 5 mg changed to 10 mg daily prn.  Provider discontinued Liraglutide 18 mg/3ML.  Flu shot given  Consult Visit: 08/16/20: Ophthalmology visit w/ Dr. Manuella Chapman. Decision to hold IVE OS #11 today. Follow up in 10 weeks for re-evaluation.   Medications: Outpatient Encounter Medications as of 09/29/2020  Medication Sig Note  . Acetylcarnitine HCl 500 MG CAPS Take 500 mg by mouth daily.   . Alcohol Swabs (B-D SINGLE USE SWABS REGULAR) PADS USE PRIOR TO INSULIN DOSE   . Alirocumab (PRALUENT) 75 MG/ML SOAJ Inject 75 mg into the skin every 14 (fourteen) days.   . Alpha-Lipoic Acid 600 MG CAPS Take 600 mg by mouth daily. 11/21/2017: ON HOLD  . amLODipine (NORVASC) 10 MG tablet TAKE 1 TABLET DAILY   . aspirin 81 MG chewable tablet Chew 1 tablet (81 mg total) by mouth daily.   . benazepril (LOTENSIN) 40 MG tablet Take 1 tablet (40 mg total) by mouth daily.   . chlorthalidone (HYGROTON) 25 MG tablet Take 1 tablet (25 mg total) by mouth daily. 06/20/2020: Reports he never took this  . diclofenac (VOLTAREN) 75 MG EC tablet TAKE 1 TABLET TWICE A Santa Abdelrahman  AS NEEDED FOR JOINT PAINS (Patient not taking: Reported on 09/29/2020) 09/29/2020: [facility]  . famotidine (PEPCID) 20 MG tablet Take  1 tablet (20 mg total) by mouth 2 (two) times daily.   Marland Kitchen glipiZIDE (GLUCOTROL) 10 MG tablet TAKE 1 TABLET TWICE A Verne Lanuza  BEFORE A MEAL 09/29/2020: Taking 1.5 tabs twice daily  . hydrochlorothiazide (HYDRODIURIL) 25 MG tablet Take 0.5 tablets (12.5 mg total) by mouth daily.   . Lancets (ONETOUCH DELICA PLUS [id number]) MISC USE TWO TIMES A Rahshawn Remo TO     CHECK BLOOD SUGAR   . Melatonin 3 MG TABS Take 5 mg by mouth at bedtime.    . metFORMIN (GLUCOPHAGE) 1000 MG tablet TAKE 1 TABLET TWICE DAILY  WITH MEALS 09/29/2020: Using metformin XR 545m #2 twice daily  . Multiple Vitamin (MULTIVITAMIN) tablet Take 1 tablet by mouth daily.   . niacin (NIASPAN) 1000 MG CR tablet Take 1,000 mg by mouth at bedtime.   .Marland Kitchenomeprazole (PRILOSEC) 40 MG capsule Take 1 capsule (40 mg total) by mouth daily.   .Glory RosebushVERIO test strip USE AS INSTRUCTED   . Semaglutide, 1 MG/DOSE, (OZEMPIC, 1 MG/DOSE,) 2 MG/1.5ML SOPN Inject 1 mg into the skin once a week.   . tadalafil (CIALIS) 10 MG tablet Take 1 tablet (10 mg total) by mouth daily as needed for erectile dysfunction. Max 20 mg total per Essa Wenk   . tadalafil (CIALIS) 5 MG tablet Take 1 tablet (5 mg total) by mouth daily.   . Turmeric 450 MG CAPS Take 1 tablet by mouth daily.    .Marland Kitchen  vitamin C (ASCORBIC ACID) 500 MG tablet Take 500 mg by mouth daily.    No facility-administered encounter medications on file as of 09/29/2020.   SDOH Screenings   Alcohol Screen: Not on file  Depression (VHQ4-6): Not on file  Financial Resource Strain: Medium Risk  . Difficulty of Paying Living Expenses: Somewhat hard  Food Insecurity: Not on file  Housing: Not on file  Physical Activity: Not on file  Social Connections: Not on file  Stress: Not on file  Tobacco Use: Medium Risk  . Smoking Tobacco Use: Former Smoker  . Smokeless Tobacco Use: Never Used  Transportation Needs: Not on file    Current Diagnosis/Assessment:  Goals Addressed            This Visit's Progress   . Chronic  Care Management Pharmacy Care Plan       CARE PLAN ENTRY (see longitudinal plan of care for additional care plan information)  Current Barriers:  . Chronic Disease Management support, education, and care coordination needs related to Hypertension, Hyperlipidemia, Diabetes, Insomnia, GERD, Pain, BPH/ED   Hypertension BP Readings from Last 3 Encounters:  07/06/20 136/84  09/25/19 (!) 176/99  09/25/19 (!) 149/81   . Pharmacist Clinical Goal(s): o Over the next 90 days, patient will work with PharmD and providers to achieve BP goal <140/90 . Current regimen:  . Amlodipine $RemoveBefo'10mg'AKDGbQufnCa$  daily . Benazepril $RemoveBefo'40mg'AoKzEWYtaNT$  daily . HCTZ $Remo'25mg'ZtPdS$  daily  . Interventions: o Discussed BP goal o Discussed diet and exercise o Discussed balance of controlling BP and preventing medication side effects . Patient self care activities - Over the next 90 days, patient will: o Check BP 1-3 times per week, document, and provide at future appointments o Ensure daily salt intake < 2300 mg/Tiena Manansala o Follow up with neurology to see if there is a cause of dizziness outside of amlodipine  Hyperlipidemia Lab Results  Component Value Date/Time   [facility]  07/06/2020 02:48 PM     Comment:     . LDL cholesterol not calculated. Triglyceride levels greater than 400 mg/dL invalidate calculated LDL results. . Reference range: <100 . Desirable range <100 mg/dL for primary prevention;   <70 mg/dL for patients with CHD or diabetic patients  with > or = 2 CHD risk factors. Marland Kitchen LDL-C is now calculated using the Martin-Hopkins  calculation, which is a validated novel method providing  better accuracy than the Friedewald equation in the  estimation of LDL-C.  Cresenciano Genre et al. Annamaria Helling. 9629;528(41): 2061-2068  (http://education.[facility].com/faq/FAQ164)    LDLDIRECT 88.0 05/28/2019 11:10 AM   . Pharmacist Clinical Goal(s): o Over the next 90 days, patient will work with PharmD and providers to achieve LDL goal < 100 . Current  regimen:  . Aspirin $RemoveB'81mg'vwQyxqfP$  daily . Niaspan CR $RemoveBe'1000mg'dgEPfZwbe$  HS  . Salmon Oil $RemoveBe'1000mg'iXLJvTzVD$  twice daily per [place] . Praluent $RemoveBe'75mg'vJNvXeyHt$  every 14 days . Interventions: o Discussed lipid goals and risk associated with elevated TRIG . Patient self care activities - Over the next 90 days, patient will: o Maintain cholesterol medication regimen.   Diabetes Lab Results  Component Value Date/Time   HGBA1C 7.8 (H) 07/06/2020 02:48 PM   HGBA1C 7.3 (H) 05/28/2019 11:10 AM   HGBA1C 8.2 08/12/2018 12:00 AM   . Pharmacist Clinical Goal(s): o Over the next 90 days, patient will work with PharmD and providers to achieve A1c goal <7% . Current regimen:  Marland Kitchen Glipizide $RemoveBeforeDEI'10mg'IAJWSTJpayfVFstP$  #1.5 tabs twice daily . Ozempic $RemoveB'1mg'hOVbkJZF$  weekly  . Metformin XR $RemoveBefo'500mg'AaacaFvQuDb$  #2 twice daily  .  Interventions: o Discussed DM goal o Discussed medication options  o Collaboration with provider regarding medication management (consider Xigduo XR 5/552m twice daily)  . Patient self care activities - Over the next 90 days, patient will: o Check blood sugar once daily, document, and provide at future appointments o Contact provider with any episodes of hypoglycemia  Osteoarthritis . Pharmacist Clinical Goal(s) o Over the next 90 days, patient will work with PharmD and providers to reduce symptoms associated with arthritis . Current regimen:  o Diclofenac 1% gel twice daily . Interventions: o Discussed using Tylenol 6517m#1-2 tabs three times daily for arthritis pain . Patient self care activities - Over the next 90 days, patient will: o Maintain medication regimen for arthritis    GERD . Pharmacist Clinical Goal(s) o Over the next 90 days, patient will work with PharmD and providers to reduce symptoms associated with GERD . Current regimen:  . Famotidine 2021mwice daily . Omeprazole 74m41mily at bedtime . Interventions: o Recommended patient take omeprazole in AM on empty stomach and take famotidine at bedtime . Patient self care activities - Over the next  90 days, patient will: o Take omeprazole in AM on empty stomach and take famotidine at bedtime  Health Maintenance  . Pharmacist Clinical Goal(s) o Over the next 90 days, patient will work with PharmD and providers to complete health maintenance screenings/vaccinations . Interventions: o Recommended patient receive Td booster . Patient self care activities - Over the next 90 days, patient will: o Receive Td booster vaccine   Medication management . Pharmacist Clinical Goal(s): o Over the next 90 days, patient will work with PharmD and providers to maintain optimal medication adherence . [facility] . Interventions o Comprehensive medication review performed. o Continue current medication management strategy o Discussed importance of medication dispensing continuity to reduce risk of error . Patient self care activities - Over the next 90 days, patient will: o Focus on medication adherence by filling and taking medications appropriately  o Take medications as prescribed o Report any questions or concerns to PharmD and/or provider(s)  Please see past updates related to this goal by clicking on the "Past Updates" button in the selected goal        Social Hx:  His wife is expecting a baby. January 3rd is due date. Wants to get healthy in preparation for the child. Has an older daughter and 3 grandchildren. Retired from [employer] years. Stopped working due to fear of dizziness.  Was in the [place] for a year.  Patient's Top 3 Health Goals 1. D/C amlodipine 2. Lower blood sugar 3. Lose weight  Update 09/29/20 Baby boy GabrValarie Merinon on 09/14/20   Query Acute: Osteoarthritis    Patient has failed these meds in past: None noted  Patient is currently uncontrolled on the following medications: . Diclofenac 1% gel twice daily  Now using a cane. [facility] for about 1 month [facility]   Plan -Recommended patient try tylenol 650mg58m2 tabs three times daily   Hypertension   BP goal is:  <140/90   CMP Latest Ref Rng & Units 07/06/2020 05/28/2019 08/12/2018  Glucose 65 - 99 mg/dL 108(H) 116(H) -  BUN 7 - 25 mg/dL 24 31(H) -  Creatinine 0.70 - 1.18 mg/dL 1.20(H) 1.22 -  Sodium 135 - 146 mmol/L 136 137 137  Potassium 3.5 - 5.3 mmol/L 4.6  4.9 4.5  Chloride 98 - 110 mmol/L 101 105 -  CO2 20 - 32 mmol/L 21 24 -  Calcium 8.6 - 10.3 mg/dL 10.4(H) 9.4 -  Total Protein 6.1 - 8.1 g/dL 7.8 7.5 -  Total Bilirubin 0.2 - 1.2 mg/dL 0.4 0.3 -  Alkaline Phos 39 - 117 U/L - 40 -  AST 10 - 35 U/L 35 20 -  ALT 9 - 46 U/L 70(H) 27 -   Office blood pressures are  BP Readings from Last 3 Encounters:  07/06/20 136/84  09/25/19 (!) 176/99  09/25/19 (!) 149/81   Patient checks BP at home 1-2x per week Patient home BP readings are ranging:  137 79 154 79 148 81 135 83 135 77  152 81 Avg 143.5 80  Patient has failed these meds in the past: None noted  Patient is currently uncontrolled on the following medications:  . Amlodipine 51m daily . Benazepril 484mdaily . Chlorthalidone 2510maily (not taking, also not in dispense report) . HCTZ 19m35mily (not taking daily, takes every 2 days)   "Not fond of amlodipine" Feels he has loss balance that he associated with start of amlodipine. Dizziness/loss of balance all throughout the Zakiah Beckerman. No headaches or chest pain[facility] Has eliminated salt from diet. Watching sugar intake.  Has fallen twice due to dog tripping him.  Feels the hctz is drying him out so only takes it every 2 days.  Never started chlorthalidone. His top medical goal is to D/C amlodipine. He does not appreciate that it affects his "love life"  Explained that all BP meds have the potential to affect this area of his life as well uncontrolled hypertension itself.  Side Note: Afib listed in pt's chart, but pt states this  was episodic and resolved. No medication therapy given for this.  We discussed diet and exercise extensively as well as the lifestyle modifications needed to help reduce the number of antihypertensives he takes. He is aware he needs to lose weight. Discussed his BP goal and how his home BP is above goal, so stopped amlodipine without an alternative would not be recommended. He is very hesitant about side effects of other BP meds, could pursue the chlorthalidone option EdwaPercell Millerposed at his January visit.  Update 07/04/20 Will discuss options with Dr. CoplLorelei Pontdate 09/29/20 BP? Daily, but interruption with baby boy named GabrValarie Merinon on 09/14/2020 Med changes? Benazepril at night, 1/2 tab amlodipine BID, hctz AM daily Feels his BP has improved since making this change 152 81 76 138 81 71 130 77 77 148  86 89 152 87 79 159 86 77 165 92  76 Avg  149.1 84.2 77.8 Denies headaches and chest pains. Has dizziness that he accounts to amlodipine. Doesn't have a set [facility].   Plan -Continue checking BP 1-2 times per week. Can increase to 3-4 times if agreeable.  -Continue current medications  -Create a SMART goal for [facility]  Future Plan -Discuss alternatives for amlodipine and/or improve lifestyle to allow for D/C of amlodipine w/o replacement and controlled BP  Hyperlipidemia   LDL goal < 100 TRIG < 150  Last lipids Lab Results  Component Value Date   CHOL 209 (H) 07/06/2020   HDL 34 (L) 07/06/2020   LDL[place]/20/2021     Comment:     . LDL cholesterol not calculated. Triglyceride levels greater than 400 mg/dL invalidate  calculated LDL results. . Reference range: <100 . Desirable range <100 mg/dL for primary prevention;   <70 mg/dL for patients with CHD or diabetic patients  with > or = 2 CHD risk factors. Marland Kitchen LDL-C is now calculated using the Martin-Hopkins  calculation, which is a validated novel method providing  better  accuracy than the Friedewald equation in the  estimation of LDL-C.  Cresenciano Genre et al. Annamaria Helling. 8016;553(74): 2061-2068  (http://education.[facility].com/faq/FAQ164)    LDLDIRECT 88.0 05/28/2019   TRIG 422 (H) 07/06/2020   CHOLHDL 6.1 (H) 07/06/2020   Hepatic Function Latest Ref Rng & Units 07/06/2020 05/28/2019 03/31/2018  Total Protein 6.1 - 8.1 g/dL 7.8 7.5 7.6  Albumin 3.5 - 5.2 g/dL - 4.4 4.6  AST 10 - 35 U/L 35 20 26  ALT 9 - 46 U/L 70(H) 27 34  Alk Phosphatase 39 - 117 U/L - 40 39  Total Bilirubin 0.2 - 1.2 mg/dL 0.4 0.3 0.3  Bilirubin, Direct 0.1 - 0.5 mg/dL - - -     The 10-year ASCVD risk score Mikey Bussing DC Jr., et al., 2013) is: 50.4%   Values used to calculate the score:     Age: 52 years     Sex: Male     Is Non-Hispanic African American: No     Diabetic: Yes     Tobacco smoker: No     Systolic Blood Pressure: 827 mmHg     Is BP treated: Yes     HDL Cholesterol: 34 mg/dL     Total Cholesterol: 209 mg/dL   Patient has failed these meds in past: statins (has tried several and cannot tolerate - G72.0) Patient is currently uncontrolled for TRIG on the following medications:  . Aspirin 52m daily . Niaspan CR 10015mHS (insurance wouldn't cover; taking OTC 100031mS) . Salmon Oil 1000m40mice daily per [facility] .[place] 75mg81mry 14 days  He reports severe cramping with statins [place] [facility] [place].  Encouraged patient to get updated labs at clinic and/or provide [facility] laLyndenresults  We discussed:  Lipid goals and risk of pancreatitis with elevated TRIG   Update 09/29/20 Start Praluent? Yes Tolerating? Yes  Plan -Continue current medications   Diabetes   A1c goal <7%  Recent Relevant Labs: Lab Results  Component Value Date/Time   HGBA1C 7.8 (H) 07/06/2020 02:48 PM   HGBA1C 7.3 (H) 05/28/2019 11:10 AM   HGBA1C 8.2 08/12/2018 12:00 AM   GFR 58.58 (L) 05/28/2019 11:10 AM   GFR 57.05 (L) 03/31/2018 01:42 PM   MICROALBUR 1.1 03/31/2018 01:42 PM     Last diabetic Eye exam: No results found for: HMDIABEYEEXA  Last diabetic Foot exam: No results found for: HMDIABFOOTEX   Checking BG: Daily  Recent FBG Readings: 205 164 188 207 182 184 190 185 178 184  Avg 186.7  Patient has failed these meds in past: metformin IR, metformin 1000mg 60mfarxiga (irritation of penis) Patient is currently uncontrolled on the following medications: . GlipMarland Kitchenzide 10mg #90mtabs twice daily . Ozempic 1mg wee64m (changed from victoza [place] . [place] XR 500mg #2 33me daily (changed from 1000mg; eve70mied 1000mg XR an21mable to tolerate)  Rep[facility] [place] with metformin XR 1000mg Has be60mn the 1mg ozempic 33m month, did 0.5mg x 2 month75mDiet Trying to do low carb diet as much as he can He is doing intermittent fasting (has x 2 years)  B - eggs and bacon, sometimes with toast D - chicken or steak, brussel sprouts or broccoli Snacks - very little (pecans) Drink - water 99% of the time, coffee  Exercise Limited due to concerns with balance  We discussed: diet and exercise extensively and DM goals   Update 07/04/20 Follow up with Dr. Lorelei Chapman scheduled  Update 09/29/20 Increased metformin to 1064m BID? Yes, but taking #2 metformin XR 500 vs IR 10030mmetformin Still has diarrhea even with the extended release metformin.  Has it daily, but not as bad as with IR formulation A1c now 7.4% [facility] (December 2021[facility] He's ready to try something different than metformin. It is affecting his quality of life. Taking immodium almost daily [place] Consider combination of farxiga and metformin.  Note he stated he had penis irritation in the past with FaIranbut pt admits this is when his a1c was higher. Higher a1c with SGLT2 associated with increased risk of urogenital side effects.  Consider Xigduo XR (Farxiga/meformin) 73m23m00mg twice  daily   Plan -Consider initiation of Xiguduo XR 5/500m82mice daily (realize XR is dosed daily, but considering patient's tolerability to metformin feel this dosing strategy would be ideal) if agreeable with Dr. CoplLorelei Pontwill also have to see if this is affordable. If not, could consider patient assistance  Vaccines   Reviewed and discussed patient's vaccination history.    Immunization History  Administered Date(s) Administered  . Fluad Quad(high Dose 65+) 05/28/2019, 07/06/2020  . Influenza, High Dose Seasonal PF 06/20/2016, 07/08/2017  . PFIZER SARS-COV-2 Vaccination 11/15/2019, 12/15/2019  . Pneumococcal Conjugate-13 06/20/2016  . Pneumococcal Polysaccharide-23 05/28/2019  . Zoster Recombinat (Shingrix) 02/19/2020, 05/03/2020   Patient states [facility] [place] specific dates. Requested that patient request vaccine records from [facility]. [place] in [facility].   Update 07/04/20 Received both Shingrix Vaccines  02/19/20 05/03/20  Update 09/29/20 Update [facility] record as noted above.  Pt received flu vaccine Eligible for Tetanus vaccine  Plan -Recommended patient receive tetanus vaccine in pharmacy.  Future Plan -Update [facility] record once patient has vaccine dates for Shingrix.  Medication Management   [facility] Uses pill box? Yes does a month at a time Pt endorses 100% compliance  Discussed how filling medications at multiple pharmacies increases risk of error.   Miscellaneous Meds  Acetylcarnitine 500mg81mly Alpha-Lipoic Acid 600mg 18my Multivitamin daily Turmeric Vitamin C   We discussed: Current pharmacy is preferred with insurance plan and patient is satisfied with pharmacy services  Plan  Continue current medication management strategy   Follow up: 3 month follow up visit  KaneshMelvenia BeamPharmD, BCACP Clinical  Pharmacist [phone]

## 2020-09-30 NOTE — Progress Notes (Signed)
Hey Dr. Lorelei Pont!   We certainly could do both separately. That could be easier considering insurance. I was hoping to reduce the patient's pill burden with the combo pill, but the ultimate goal is to get his DM to goal, so either option sounds like a great choice if the patient is agreeable.   All the best,  De Blanch, PharmD, BCACP Clinical Pharmacist [facility] at [facility] [phone]

## 2020-10-01 ENCOUNTER — Encounter: Payer: Self-pay | Admitting: Family Medicine

## 2020-10-01 DIAGNOSIS — E1140 Type 2 diabetes mellitus with diabetic neuropathy, unspecified: Secondary | ICD-10-CM

## 2020-10-02 MED ORDER — DAPAGLIFLOZIN PROPANEDIOL 5 MG PO TABS: 5.0000 mg | ORAL_TABLET | Freq: Every day | ORAL | 3 refills | Status: DC

## 2020-10-02 NOTE — Addendum Note (Signed)
Addended by: Lamar Blinks C on: 10/02/2020 07:02 AM   Modules accepted: Orders

## 2020-10-04 MED ORDER — GLIPIZIDE 10 MG PO TABS: ORAL_TABLET | ORAL | 3 refills | Status: DC

## 2020-10-04 NOTE — Addendum Note (Signed)
Addended by: Lamar Blinks C on: 10/04/2020 01:58 PM   Modules accepted: Orders

## 2020-10-04 NOTE — Addendum Note (Signed)
Addended by: Lamar Blinks C on: 10/04/2020 12:56 PM   Modules accepted: Orders

## 2020-10-05 ENCOUNTER — Telehealth: Payer: Medicare HMO

## 2020-10-13 DIAGNOSIS — G4733 Obstructive sleep apnea (adult) (pediatric): Secondary | ICD-10-CM | POA: Diagnosis not present

## 2020-10-13 DIAGNOSIS — K219 Gastro-esophageal reflux disease without esophagitis: Secondary | ICD-10-CM | POA: Diagnosis not present

## 2020-10-13 DIAGNOSIS — H547 Unspecified visual loss: Secondary | ICD-10-CM | POA: Diagnosis not present

## 2020-10-13 DIAGNOSIS — I10 Essential (primary) hypertension: Secondary | ICD-10-CM | POA: Diagnosis not present

## 2020-10-13 DIAGNOSIS — E1136 Type 2 diabetes mellitus with diabetic cataract: Secondary | ICD-10-CM | POA: Diagnosis not present

## 2020-10-13 DIAGNOSIS — E1142 Type 2 diabetes mellitus with diabetic polyneuropathy: Secondary | ICD-10-CM | POA: Diagnosis not present

## 2020-10-13 DIAGNOSIS — M1990 Unspecified osteoarthritis, unspecified site: Secondary | ICD-10-CM | POA: Diagnosis not present

## 2020-10-13 DIAGNOSIS — N529 Male erectile dysfunction, unspecified: Secondary | ICD-10-CM | POA: Diagnosis not present

## 2020-10-13 DIAGNOSIS — G8929 Other chronic pain: Secondary | ICD-10-CM | POA: Diagnosis not present

## 2020-11-25 ENCOUNTER — Encounter: Payer: Self-pay | Admitting: Family Medicine

## 2020-11-25 DIAGNOSIS — I10 Essential (primary) hypertension: Secondary | ICD-10-CM

## 2020-11-25 MED ORDER — AMLODIPINE BESYLATE 10 MG PO TABS: ORAL_TABLET | ORAL | 0 refills | Status: DC

## 2020-11-25 MED ORDER — AMLODIPINE BESYLATE 10 MG PO TABS: ORAL_TABLET | ORAL | 5 refills | Status: DC

## 2020-12-28 ENCOUNTER — Telehealth: Payer: Medicare HMO

## 2020-12-29 DIAGNOSIS — G720 Drug-induced myopathy: Secondary | ICD-10-CM

## 2020-12-29 DIAGNOSIS — Z789 Other specified health status: Secondary | ICD-10-CM

## 2020-12-29 NOTE — Progress Notes (Signed)
[facility])                                            [place] Team                                        Statin Quality Measure Assessment    12/29/2020  Yael Angerer [facility] 18-Apr-1946 [id number]  Per review of chart and payor information, this patient has been flagged for non-adherence to the following CMS Quality Measure:   [x]  Statin Use in Persons with Diabetes  []  Statin Use in Persons with Cardiovascular Disease   On 06/20/2020, statin intolerance was associated to telephone encounter (drug-induced myopathy , 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 However, this did not remove patient from measure (needs to be inperson). Currently, there are no in-person appointments with PCP or embedded pharmacist.   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

## 2021-01-02 ENCOUNTER — Ambulatory Visit (INDEPENDENT_AMBULATORY_CARE_PROVIDER_SITE_OTHER): Payer: Medicare HMO | Admitting: Pharmacist

## 2021-01-02 DIAGNOSIS — E1140 Type 2 diabetes mellitus with diabetic neuropathy, unspecified: Secondary | ICD-10-CM | POA: Diagnosis not present

## 2021-01-02 DIAGNOSIS — I10 Essential (primary) hypertension: Secondary | ICD-10-CM

## 2021-01-02 DIAGNOSIS — I480 Paroxysmal atrial fibrillation: Secondary | ICD-10-CM | POA: Diagnosis not present

## 2021-01-02 DIAGNOSIS — G4700 Insomnia, unspecified: Secondary | ICD-10-CM

## 2021-01-02 DIAGNOSIS — G8929 Other chronic pain: Secondary | ICD-10-CM

## 2021-01-02 DIAGNOSIS — R972 Elevated prostate specific antigen [PSA]: Secondary | ICD-10-CM

## 2021-01-02 DIAGNOSIS — M549 Dorsalgia, unspecified: Secondary | ICD-10-CM

## 2021-01-02 DIAGNOSIS — G720 Drug-induced myopathy: Secondary | ICD-10-CM

## 2021-01-03 ENCOUNTER — Telehealth: Payer: Self-pay | Admitting: Pharmacist

## 2021-01-03 MED ORDER — METFORMIN HCL ER 500 MG PO TB24: 1000.0000 mg | ORAL_TABLET | Freq: Two times a day (BID) | ORAL | 0 refills | Status: DC

## 2021-01-03 MED ORDER — METFORMIN HCL ER 500 MG PO TB24: 1000.0000 mg | ORAL_TABLET | Freq: Two times a day (BID) | ORAL | 3 refills | Status: DC

## 2021-01-03 NOTE — Patient Instructions (Signed)
Visit Information Timothy Chapman,  It was a pleasure speaking with you today. Please feel free to contact me if you have any questions or concerns. Below is information regarding you health goals.  It is very important you follow up with Dr Amalia Hailey your urologist.  Please let me know if you have any issues contacting his office.    Cherre Robins, PharmD Clinical Pharmacist [facility] [phone]  PATIENT GOALS: Goals Addressed            This Visit's Progress   . Chronic Care Management Pharmacy Care Plan       CARE PLAN ENTRY (see longitudinal plan of care for additional care plan information)  Current Barriers:  . Chronic Disease Management support, education, and care coordination needs related to Hypertension, Hyperlipidemia, Diabetes, Insomnia, GERD, Pain, BPH/ED   Hypertension BP Readings from Last 3 Encounters:  07/06/20 136/84  09/25/19 (!) 176/99  09/25/19 (!) 149/81   . Pharmacist Clinical Goal(s): o Over the next 90 days, patient will work with PharmD and providers to maintain BP goal <140/90 while minimizing side effects / dizziness and falls. . Current regimen:  . Amlodipine 10mg  - take 0.5 tablet = 5mg  twice a day or 1 tablet at bedtime . Benazepril 40mg  daily at bedtime . HCTZ 25mg  daily in the morning . Interventions: o Discussed blood pressure goal o Discussed diet and exercise o Discussed balance of controlling BP and preventing medication side effects; Suggest trial of taking amlodipine 10mg  daily in evening to see if this helps with dizziness.  . Patient self care activities - Over the next 90 days, patient will: o Check blood pressure 1 to 3 times per week, document, and provide at future appointments o Ensure daily salt intake < 2300 mg/day o Follow up with neurology to see if there is a cause of dizziness outside of amlodipine  Hyperlipidemia with elevated triglycerides Lipid Panel     Component Value Date/Time    CHOL 209 (H) 07/06/2020 1448   TRIG 422 (H) 07/06/2020 1448   HDL 34 (L) 07/06/2020 1448   CHOLHDL 6.1 (H) 07/06/2020 1448   VLDL 57.0 (H) 07/09/2017 0826   LDLDIRECT 88.0 05/28/2019 1110    . Pharmacist Clinical Goal(s): o Over the next 90 days, patient will work with PharmD and providers to achieve LDL goal < 100 and Triglycerides <150 . Current regimen:  . Aspirin 81mg  daily . Niaspan CR 1000mg  at bedtime (lowers triglycerides and can increase HDL) . Salmon Oil 1000mg  twice daily [facility] (lowers triglycerides and can increase HDL) . Praluent 75mg  every 14 days (lower LDL mostly be also can lower triglycerides) . Interventions: o Discussed lipid goals and risk associated with elevated TRIG o Reviewed foods that can increase triglycerides; continue to limit intake of alcohol, sweets / sugar, potatoes, bread, rice and pasta . Patient self care activities - Over the next 90 days, patient will: o Maintain cholesterol medication regimen.  o Limit intake of foods that can increase triglycerides  Diabetes Lab Results  Component Value Date/Time   HGBA1C 7.8 (H) 07/06/2020 02:48 PM   HGBA1C 7.3 (H) 05/28/2019 11:10 AM   HGBA1C 8.2 08/12/2018 12:00 AM   . Pharmacist Clinical Goal(s): o Over the next 90 days, patient will work with PharmD and providers to achieve A1c goal <7% . Current regimen:  Marland Kitchen Glipizide 10mg  - take 2 tablets = 20mg  twice a day with morning and evening meals . Ozempic - injection 1mg   weekly . Metformin XR 500mg  - take 2 tablets = 1000mg  twice a day with morning and evening meals . Interventions: o Discussed A1c and blood glucose goals o Reviewed home blood glucose readings and reviewed goals  - Fasting blood glucose goal (before meals) = 80 to 130 - Blood glucose goal after a meal = less than 180  o Discussed medication options - might be able to increase Ozempic to 2mg  once new dose is available o Coordinated refills for metformin with PCP . Patient self care  activities - Over the next 90 days, patient will: o Check blood sugar once daily, document, and provide at future appointments o Contact provider with any episodes of hypoglycemia  Prostate Health /  elevated PSA; . Last PSA was elevated at 8.02 on 07/06/20 . Interventions: o Follow up with urologist Dr Butch Penny . Patient self care activities - Over the next 30 days, patient will: o Call Dr Rebekah Chesterfield office for appointment - [phone] - Address is : [address] / [place], [place] [address]  Osteoarthritis . Pharmacist Clinical Goal(s) o Over the next 90 days, patient will work with PharmD and providers to reduce symptoms associated with arthritis . Current regimen:  o Tumeric 450mg  daily  . Patient self care activities - Over the next 90 days, patient will: o Maintain medication regimen for arthritis    GERD . Pharmacist Clinical Goal(s) o Over the next 90 days, patient will work with PharmD and providers to reduce symptoms associated with GERD . Current regimen:  . Famotidine 20mg  twice daily . Omeprazole 40mg  daily at bedtime . Patient self care activities - Over the next 90 days, patient will: o Take omeprazole in morning on empty stomach and take famotidine at bedtime  Medication management . Pharmacist Clinical Goal(s): o Over the next 90 days, patient will work with PharmD and providers to maintain optimal medication adherence . Current [facility] . Interventions o Comprehensive medication review performed. o Continue current medication management strategy o Discussed importance of medication dispensing continuity to reduce risk of error . Patient self care activities - Over the next 90 days, patient will: o Focus on medication adherence by filling and taking medications appropriately  o Take medications as prescribed o Report any questions or concerns to PharmD and/or provider(s)  Please see past updates related to this goal by clicking on the "Past  Updates" button in the selected goal         The patient verbalized understanding of instructions, educational materials, and care plan provided today and agreed to receive a mailed copy of patient instructions, educational materials, and care plan.   Telephone follow up appointment with care management team member scheduled for:  3 to 4 months

## 2021-01-03 NOTE — Chronic Care Management (AMB) (Signed)
Chronic Care Management Pharmacy Note  01/03/2021 Name:  Timothy Chapman MRN:  [id number] DOB:  [birth year]  Subjective: Timothy Chapman is an 75 y.o. year old male who is a primary patient of Copland, Gay Filler, MD.  The CCM team was consulted for assistance with disease management and care coordination needs.    Engaged with patient by telephone for follow up visit in response to provider referral for pharmacy case management and/or care coordination services.   Consent to Services:  The patient was given information about Chronic Care Management services, agreed to services, and gave verbal consent prior to initiation of services.  Please see initial visit note for detailed documentation.   Patient Care Team: Copland, Gay Filler, MD as PCP - General (Family Medicine) Cherre Robins, PharmD (Pharmacist)  Recent office visits: 07/06/2020: PCP Visit w/ Dr. Lorelei Pont -  f/u visit.Labs ordered. Provider changed metformin 500 mg at bedtime to 1000 mg tabs twice daily, Niacin 500 mg at bedtime to 1000 at bedtime, Tadalafil 5 mg changed to 10 mg daily prn. Provider discontinued Liraglutide 18 mg/3ML. Flu shot given  Recent Consult Visit: 11/01/2020 Ophthalmology visit w/ Dr. Manuella Ghazi. F/U BRVO with macular edema of left eye; Given IVE OS #11 (Eylea) Follow up in 12 weeks for re-evaluation. 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, without long-term current use of insulin - Dr Manuella Ghazi stressed strict glycemic control with a goal A1c< 7.0 along with continued optimization of serum lipids, blood pressure , and avoiding cigarette or any type of tobacco  Patient also is seen at [facility] in [place], [facility] visits: None in previous 6 months  Objective:  Lab Results  Component Value Date   CREATININE 1.20 (H) 07/06/2020   CREATININE 1.22 05/28/2019   CREATININE 1.32 03/31/2018    Lab Results  Component Value Date   HGBA1C 7.8 (H)  07/06/2020   Last diabetic Eye exam: No results found for: HMDIABEYEEXA  Last diabetic Foot exam: No results found for: HMDIABFOOTEX      Component Value Date/Time   CHOL 209 (H) 07/06/2020 1448   TRIG 422 (H) 07/06/2020 1448   HDL 34 (L) 07/06/2020 1448   CHOLHDL 6.1 (H) 07/06/2020 1448   VLDL 57.0 (H) 07/09/2017 0826   LDLCALC  07/06/2020 1448     Comment:     . LDL cholesterol not calculated. Triglyceride levels greater than 400 mg/dL invalidate calculated LDL results. . Reference range: <100 . Desirable range <100 mg/dL for primary prevention;   <70 mg/dL for patients with CHD or diabetic patients  with > or = 2 CHD risk factors. Marland Kitchen LDL-C is now calculated using the Martin-Hopkins  calculation, which is a validated novel method providing  better accuracy than the Friedewald equation in the  estimation of LDL-C.  Cresenciano Genre et al. Annamaria Helling. 3354;562(56): 2061-2068  (http://education.[employer].com/faq/FAQ164)    LDLDIRECT 88.0 05/28/2019 1110    Hepatic Function Latest Ref Rng & Units 07/06/2020 05/28/2019 03/31/2018  Total Protein 6.1 - 8.1 g/dL 7.8 7.5 7.6  Albumin 3.5 - 5.2 g/dL - 4.4 4.6  AST 10 - 35 U/L 35 20 26  ALT 9 - 46 U/L 70(H) 27 34  Alk Phosphatase 39 - 117 U/L - 40 39  Total Bilirubin 0.2 - 1.2 mg/dL 0.4 0.3 0.3  Bilirubin, Direct 0.1 - 0.5 mg/dL - - -    No results found for: TSH, FREET4  CBC Latest Ref Rng & Units  07/06/2020 05/28/2019 03/31/2018  WBC 3.8 - 10.8 Thousand/uL 8.9 7.2 7.4  Hemoglobin 13.2 - 17.1 g/dL 13.3 12.2(L) 12.3(L)  Hematocrit 38.5 - 50.0 % 40.7 37.5(L) 36.2(L)  Platelets 140 - 400 Thousand/uL 247 276.0 294.0    No results found for: VD25OH  Clinical ASCVD: No  The 10-year ASCVD risk score Mikey Bussing DC Jr., et al., 2013) is: 62.5%   Values used to calculate the score:     Age: 37 years     Sex: Male     Is Non-Hispanic African American: No     Diabetic: Yes     Tobacco smoker: Yes     Systolic Blood Pressure: 941 mmHg      Is BP treated: Yes     HDL Cholesterol: 34 mg/dL     Total Cholesterol: 209 mg/dL    Other: (CHADS2VASc if Afib, PHQ9 if depression, MMRC or CAT for COPD, ACT, DEXA)  Social History   Tobacco Use  Smoking Status Former Smoker  . Quit date: 09/18/1975  . Years since quitting: 45.3  Smokeless Tobacco Never Used   BP Readings from Last 3 Encounters:  07/06/20 136/84  09/25/19 (!) 176/99  09/25/19 (!) 149/81   Pulse Readings from Last 3 Encounters:  07/06/20 86  09/25/19 74  05/28/19 74   Wt Readings from Last 3 Encounters:  07/06/20 253 lb (114.8 kg)  09/25/19 251 lb (113.9 kg)  05/28/19 254 lb (115.2 kg)    Assessment: Review of patient past medical history, allergies, medications, health status, including review of consultants reports, laboratory and other test data, was performed as part of comprehensive evaluation and provision of chronic care management services.   SDOH:  (Social Determinants of Health) assessments and interventions performed:  SDOH Interventions   Flowsheet Row Most Recent Value  SDOH Interventions   Financial Strain Interventions Intervention Not Indicated      CCM Care Plan  Allergies  Allergen Reactions  . Codeine     hallucinations  . Wilder Glade [Dapagliflozin] Other (See Comments)    Genital infection and irritation  . Statins     Has tried several cannot tolerate    Medications Reviewed Today    Reviewed by Cherre Robins, PharmD (Pharmacist) on 01/02/21 at 1422  Med List Status: <None>  Medication Order Taking? Sig Documenting Provider Last Dose Status Informant  Acetylcarnitine HCl 500 MG CAPS [id number] Yes Take 500 mg by mouth daily. [provider] Taking Active Self           Med Note Rosemarie Beath, MELISSA B   Thu Nov 21, 2017 12:03 PM)    Alcohol Swabs (B-D SINGLE USE SWABS REGULAR) PADS [id number] Yes USE PRIOR TO INSULIN DOSE Copland, Gay Filler, MD Taking Active   Alirocumab (PRALUENT) 75 MG/ML SOAJ [id number] Yes Inject 75 mg  into the skin every 14 (fourteen) days. Copland, Gay Filler, MD Taking Active   Alpha-Lipoic Acid 600 MG CAPS [id number] Yes Take 600 mg by mouth daily. [provider] Taking Active Self           Med Note Rosemarie Beath, MELISSA B   Thu Nov 21, 2017 12:06 PM) ON HOLD  amLODipine (NORVASC) 10 MG tablet [id number] Yes TAKE 1 TABLET DAILY  Patient taking differently: Take 5 mg by mouth in the morning and at bedtime.   Copland, Gay Filler, MD Taking Active   aspirin 81 MG chewable tablet [id number] Yes Chew 1 tablet (81 mg total) by mouth daily. Shelly Coss, MD Taking Active Self  benazepril (LOTENSIN) 40 MG tablet [id number] Yes Take 1 tablet (40 mg total) by mouth daily. Copland, Gay Filler, MD Taking Active   diclofenac (VOLTAREN) 75 MG EC tablet [id number] No TAKE 1 TABLET TWICE A DAY  AS NEEDED FOR JOINT PAINS  Patient not taking: No sig reported   Copland, Gay Filler, MD Not Taking Active            Med Note Lynita Lombard Sep 29, 2020  3:10 PM) Recommended not to take by [facility]  famotidine (PEPCID) 20 MG tablet [id number] Yes Take 1 tablet (20 mg total) by mouth 2 (two) times daily. Copland, Gay Filler, MD Taking Active   glipiZIDE (GLUCOTROL) 10 MG tablet [id number] Yes TAKE 2 TABLETS TWICE A DAY  BEFORE A MEAL Copland, Gay Filler, MD Taking Active   hydrochlorothiazide (HYDRODIURIL) 25 MG tablet [id number] Yes Take 0.5 tablets (12.5 mg total) by mouth daily.  Patient taking differently: Take 25 mg by mouth daily.   Copland, Gay Filler, MD Taking Active   Lancets (ONETOUCH DELICA PLUS ZWCHEN27P) [place] [id number] Yes USE TWO TIMES A DAY TO     CHECK BLOOD SUGAR Copland, Gay Filler, MD Taking Active   melatonin 5 MG TABS [id number] Yes Take 5 mg by mouth at bedtime.  [provider] Taking Active Self  metFORMIN (GLUCOPHAGE-XR) 500 MG 24 hr tablet [id number] Yes Take 1,000 mg by mouth 2 (two) times daily after a meal. Take with food [provider] Taking Active   Multiple Vitamin  (MULTIVITAMIN) tablet [id number] Yes Take 1 tablet by mouth daily. [provider] Taking Active Self  niacin (NIASPAN) 1000 MG CR tablet [id number] Yes Take 1,000 mg by mouth at bedtime. [provider] Taking Active   omeprazole (PRILOSEC) 40 MG capsule [id number] Yes Take 1 capsule (40 mg total) by mouth daily. Copland, Gay Filler, MD Taking Active   [facility] VERIO test strip [id number] Yes USE AS INSTRUCTED Copland, Gay Filler, MD Taking Active   Semaglutide, 1 MG/DOSE, (OZEMPIC, 1 MG/DOSE,) 2 MG/1.5ML SOPN [id number] Yes Inject 1 mg into the skin once a week. [provider] Taking Active   tadalafil (CIALIS) 10 MG tablet [id number]  Take 1 tablet (10 mg total) by mouth daily as needed for erectile dysfunction. Max 20 mg total per day Copland, Gay Filler, MD  Active   tadalafil (CIALIS) 5 MG tablet [id number]  Take 1 tablet (5 mg total) by mouth daily. Copland, Gay Filler, MD  Active   Turmeric 450 MG CAPS [id number] Yes Take 1 tablet by mouth daily.  [provider] Taking Active Self  vitamin C (ASCORBIC ACID) 500 MG tablet [id number] Yes Take 500 mg by mouth daily. [provider] Taking Active Self          Patient Active Problem List   Diagnosis Date Noted  . 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 Paroxysmal atrial fibrillation ([place]) 12/16/2017  . SBO (small bowel obstruction) ([place]) 10/08/2017  . ATN (acute tubular necrosis) ([place]) 10/08/2017  . Septic shock ([place]) 10/04/2017  . Acute cholecystitis s/p perc cholecystomy drainage 10/04/2017   . Acute respiratory failure with hypoxia ([place])   . Chronic back pain 02/08/2016  . Controlled type 2 diabetes mellitus with diabetic neuropathy, without long-term current use of insulin ([place]) 02/08/2016  . Insomnia 02/08/2016  . History of diverticulitis 02/08/2016  . HYPERTENSION, BENIGN ESSENTIAL, UNCONTROLLED 03/12/2009  . ABNORMAL ELECTROCARDIOGRAM 03/12/2009    Immunization History  Administered Date(s)  Administered  . Fluad  Quad(high Dose 65+) 05/28/2019, 07/06/2020  . Influenza, High Dose Seasonal PF 06/20/2016, 07/08/2017  . PFIZER(Purple Top)SARS-COV-2 Vaccination 11/15/2019, 12/15/2019, 09/02/2020  . Pneumococcal Conjugate-13 06/20/2016  . Pneumococcal Polysaccharide-23 05/28/2019  . Zoster Recombinat (Shingrix) 02/19/2020, 05/03/2020    Conditions to be addressed/monitored: Atrial Fibrillation, HTN, HLD, Hypertriglyceridemia, DMII and GERD with history of Barrett's Esophagus; NASH  Care Plan : General Pharmacy (Adult)  Updates made by Cherre Robins, PHARMD since 01/03/2021 12:00 AM    Problem: Chronic Disease Management support, education, and care coordination needs related to HTN, Hyperlipidemia, Diabetes, Insomnia, GERD, Pain, BPH/ED   Note:   Current Barriers:  Chronic Disease Management support, education, and care coordination needs related to Hypertension, Hyperlipidemia, Diabetes, Insomnia, GERD (with Barrett's Esophagus); NASH; Pain, BPH/ED Low adherence with metformin (last filled over 3 months ago)  Pharmacist Clinical Goal(s):  Marland Kitchen Over the next 180 days, patient will achieve adherence to monitoring guidelines and medication adherence to achieve therapeutic efficacy . achieve control of diabetes and hypertension as evidenced by A1c < 7.0% and BP <140/90 . adhere to prescribed medication regimen as evidenced by refill history and meeting of goal mentioned below  through collaboration with PharmD and provider.   Interventions: . 1:1 collaboration with Copland, Gay Filler, MD regarding development and update of comprehensive plan of care as evidenced by provider attestation and co-signature . Inter-disciplinary care team collaboration (see longitudinal plan of care) . Comprehensive medication review performed; medication list updated in electronic medical record  Diabetes: . Uncontrolled - A1c goal <7.0% . Current regimen:  Marland Kitchen Glipizide 38m - take 2 tablets = 29mtwice  a day with morning and evening meals . Ozempic - injection 16m15meekly . Metformin XR 500m30mtake 2 tablets = 1000mg52mce a day with morning and evening meals . Was unable to tolerate FarxiIranused irritation and infection of genitals . Patient reports recent home BG: 120 - 200;  . Denies BG <80 / hypoglycemic symptoms . Reviewed diet - patient denies eating sugar or drinking juices or sodas; tries to limit CHO intake . No regular exercise . Interventions: o Discussed A1c and blood glucose goals o Reviewed home blood glucose readings and reviewed goals  - Fasting blood glucose goal (before meals) = 80 to 130 - Blood glucose goal after a meal = less than 180  o Discussed medication options - might be able to increase Ozempic to 2mg o27m new dose is available o Coordinated refills for metformin with PCP  Hypertension: BP Readings from Last 3 Encounters:  07/06/20 136/84  09/25/19 (!) 176/99  09/25/19 (!) 149/81   . Variable - goal BP <140/90 . Current regimen:  . Amlodipine 10mg -65me 0.5 tablet = 5mg twi72ma day . Benazepril 40mg dai96mt bedtime . HCTZ 25mg dail67m the morning . Interventions: o Discussed blood pressure goal o Discussed diet and exercise o Discussed balance of controlling BP and preventing medication side effects; Suggest trial of taking amlodipine 10mg daily816mevening to see if this helps with dizziness.  o Recommended patient check blood pressure 1 to 3 times per week, document, and provide at future appointments o Ensure daily salt intake < 2300 mg/day  Hyperlipidemia with elevated triglycerides Lipid Panel     Component Value Date/Time   CHOL 209 (H) 07/06/2020 1448   TRIG 422 (H) 07/06/2020 1448   HDL 34 (L) 07/06/2020 1448   CHOLHDL 6.1 (H) 07/06/2020 1448   VLDL 57.0 (H) 07/09/2017 [id number]  LDLDIRECT 88.0 05/28/2019 1110    . LDL goal < 100 and Triglycerides <150 . Current regimen:  . Niaspan CR 1050m at bedtime - take aspirin 851mprior  to niaspan to decrease fl[place] . Salmon Oil 100041mwice daily per [facility] [place] Praluent 39m25mery 14 days  . Interventions: o Discussed lipid goals and risk associated with elevated TRIG o Reviewed foods that can increase triglycerides; encouraged patient to continue to limit intake of alcohol, sweets / sugar, potatoes, bread, rice and pasta  Chronic Back Pain / Osteoarthritis . Current regimen:  o Tumeric 450mg55mly  . Patient self care activities - Over the next 90 days, patient will: o Maintain medication regimen for arthritis    GERD . Current regimen:  . Famotidine 20mg 13me daily . Omeprazole 40mg d59m at bedtime . Patient self care activities - Over the next 90 days, patient will: o Take omeprazole in morning on empty stomach and take famotidine at bedtime  Insomnia:  . Current Regimen:  o Melatonin 5mg qhs62mPatient reports he is sleeping fairly well at night.  . He does have a new 3 month 69ld at home and some night he is able to sleep better that other.  . Interventions: none . Continue current regimen for sleep  BPH with elevated PSA; . Last PSA was elevated at 8.02 on 07/06/20 . Dr Copland Lorelei Pontnded patient follow up with urologist but patient states today he has not yet.  . He hasMarland Kitchenseen Dr Aubrey EMarchia Bond; Recommended he follow up with Dr Evans ASAmalia Haileyrovider phone number for Dr Evan's oRebekah Chesterfield   Medication management . Current pharmacy: [facility] . Interventions o Comprehensive medication review performed. Medication list updated o Reviewed refill history and discussed adherence with patient. o Continue current medication management strategy o Discussed importance of medication dispensing continuity to reduce risk of error   Patient Goals/Self-Care Activities . Over the next 180 days, patient will:  take medications as prescribed, check glucose daily , document, and provide at future appointments, check blood pressure 1 to 3 times per  week, document, and provide at future appointments, and collaborate with provider on medication access solutions  Follow Up Plan: Telephone follow up appointment with care management team member scheduled for:  6 months             Medication Assistance: None required.  Patient affirms current coverage meets needs.  Patient's preferred pharmacy is:  [facility] [phone] [phone] [phone]-[phone] #[id number] - [id number], [place] - [address] [place] [address] [phone] Up:  Patient agrees to Care Plan and Follow-up.  Plan: Telephone follow up appointment with care management team member scheduled for:  3 months  Cayli Escajeda EckCherre RobinsClinical Pharmacist [place] PRenningersrWhitneynMar[facility]

## 2021-01-03 NOTE — Telephone Encounter (Signed)
Patient is requesting Refills for metformin ER 500mg  - take 2 tablets twice a day.  Needs 30 day supply sent locally to [place] and 90 day supply sent to mail order [place] for future use.

## 2021-01-16 DIAGNOSIS — E1142 Type 2 diabetes mellitus with diabetic polyneuropathy: Secondary | ICD-10-CM | POA: Diagnosis not present

## 2021-01-16 DIAGNOSIS — Z7984 Long term (current) use of oral hypoglycemic drugs: Secondary | ICD-10-CM | POA: Diagnosis not present

## 2021-01-16 DIAGNOSIS — I10 Essential (primary) hypertension: Secondary | ICD-10-CM | POA: Diagnosis not present

## 2021-01-16 DIAGNOSIS — N529 Male erectile dysfunction, unspecified: Secondary | ICD-10-CM | POA: Diagnosis not present

## 2021-01-16 DIAGNOSIS — Z833 Family history of diabetes mellitus: Secondary | ICD-10-CM | POA: Diagnosis not present

## 2021-01-16 DIAGNOSIS — Z7689 Persons encountering health services in other specified circumstances: Secondary | ICD-10-CM | POA: Diagnosis not present

## 2021-01-16 DIAGNOSIS — Z1329 Encounter for screening for other suspected endocrine disorder: Secondary | ICD-10-CM | POA: Diagnosis not present

## 2021-01-16 DIAGNOSIS — Z79899 Other long term (current) drug therapy: Secondary | ICD-10-CM | POA: Diagnosis not present

## 2021-01-16 DIAGNOSIS — Z8249 Family history of ischemic heart disease and other diseases of the circulatory system: Secondary | ICD-10-CM | POA: Diagnosis not present

## 2021-01-16 DIAGNOSIS — K219 Gastro-esophageal reflux disease without esophagitis: Secondary | ICD-10-CM | POA: Diagnosis not present

## 2021-01-17 ENCOUNTER — Other Ambulatory Visit: Payer: Self-pay | Admitting: Family Medicine

## 2021-01-17 ENCOUNTER — Telehealth: Payer: Self-pay | Admitting: Pharmacist

## 2021-01-17 DIAGNOSIS — E785 Hyperlipidemia, unspecified: Secondary | ICD-10-CM

## 2021-01-17 DIAGNOSIS — N529 Male erectile dysfunction, unspecified: Secondary | ICD-10-CM | POA: Insufficient documentation

## 2021-01-17 DIAGNOSIS — K219 Gastro-esophageal reflux disease without esophagitis: Secondary | ICD-10-CM | POA: Insufficient documentation

## 2021-01-17 HISTORY — DX: Male erectile dysfunction, unspecified: N52.9

## 2021-01-17 HISTORY — DX: Gastro-esophageal reflux disease without esophagitis: K21.9

## 2021-01-17 NOTE — Chronic Care Management (AMB) (Signed)
Chronic Care Management Pharmacy Assistant   Name: Timothy Chapman  MRN: [id number] DOB: Aug 25, 1946   Reason for Encounter: Disease State-Hypertension   Recent office visits:  01/16/21-Timothy Chapman,(PCP)- initial visit, stopped amlodipine, started losartan-hctz 100-25 mg daily, follow up in 2 weeks  Recent consult visits:  None noted  Hospital visits:  None in previous 6 months  Medications: Outpatient Encounter Medications as of 01/17/2021  Medication Sig Note  . Acetylcarnitine HCl 500 MG CAPS Take 500 mg by mouth daily.   . Alcohol Swabs (B-D SINGLE USE SWABS REGULAR) PADS USE PRIOR TO INSULIN DOSE   . Alirocumab (PRALUENT) 75 MG/ML SOAJ Inject 75 mg into the skin every 14 (fourteen) days.   . Alpha-Lipoic Acid 600 MG CAPS Take 600 mg by mouth daily. 11/21/2017: ON HOLD  . amLODipine (NORVASC) 10 MG tablet TAKE 1 TABLET DAILY (Patient taking differently: Take 5 mg by mouth in the morning and at bedtime.)   . aspirin 81 MG chewable tablet Chew 1 tablet (81 mg total) by mouth daily.   . benazepril (LOTENSIN) 40 MG tablet Take 1 tablet (40 mg total) by mouth daily.   . diclofenac (VOLTAREN) 75 MG EC tablet TAKE 1 TABLET TWICE A DAY  AS NEEDED FOR JOINT PAINS (Patient not taking: No sig reported) 09/29/2020: [facility] not to [facility] by [facility]  . famotidine (PEPCID) 20 MG tablet Take 1 tablet (20 mg total) by mouth 2 (two) times daily.   Marland Kitchen glipiZIDE (GLUCOTROL) 10 MG tablet TAKE 2 TABLETS TWICE A DAY  BEFORE A MEAL   . hydrochlorothiazide (HYDRODIURIL) 25 MG tablet Take 0.5 tablets (12.5 mg total) by mouth daily. (Patient taking differently: Take 25 mg by mouth daily.)   . Lancets (ONETOUCH DELICA PLUS [id number]) MISC USE TWO TIMES A DAY TO     CHECK BLOOD SUGAR   . melatonin 5 MG TABS Take 5 mg by mouth at bedtime.    . metFORMIN (GLUCOPHAGE-XR) 500 MG 24 hr tablet Take 2 tablets (1,000 mg total) by mouth 2 (two) times daily after a meal. Take with food   . Multiple Vitamin  (MULTIVITAMIN) tablet Take 1 tablet by mouth daily.   . niacin (NIASPAN) 1000 MG CR tablet Take 1,000 mg by mouth at bedtime.   Marland Kitchen omeprazole (PRILOSEC) 40 MG capsule Take 1 capsule (40 mg total) by mouth daily.   Timothy Chapman VERIO test strip USE AS INSTRUCTED   . Semaglutide, 1 MG/DOSE, (OZEMPIC, 1 MG/DOSE,) 2 MG/1.5ML SOPN Inject 1 mg into the skin once a week.   . tadalafil (CIALIS) 10 MG tablet Take 1 tablet (10 mg total) by mouth daily as needed for erectile dysfunction. Max 20 mg total per day   . tadalafil (CIALIS) 5 MG tablet Take 1 tablet (5 mg total) by mouth daily.   . Turmeric 450 MG CAPS Take 1 tablet by mouth daily.    . vitamin C (ASCORBIC ACID) 500 MG tablet Take 500 mg by mouth daily.    No facility-administered encounter medications on file as of 01/17/2021.    Recent Relevant Labs: Lab Results  Component Value Date/Time   HGBA1C 7.8 (H) 07/06/2020 02:48 PM   HGBA1C 7.3 (H) 05/28/2019 11:10 AM   HGBA1C 8.2 08/12/2018 12:00 AM   MICROALBUR 1.1 03/31/2018 01:42 PM    Kidney Function Lab Results  Component Value Date/Time   CREATININE 1.20 (H) 07/06/2020 02:48 PM   CREATININE 1.22 05/28/2019 11:10 AM   CREATININE 1.32 03/31/2018 01:42 PM  CREATININE 1.21 11/08/2017 01:33 PM   GFR 58.58 (L) 05/28/2019 11:10 AM   GFRNONAA 59 (L) 12/12/2017 01:16 PM   GFRAA >60 12/12/2017 01:16 PM    Reviewed chart prior to disease state call. Spoke with patient regarding BP  Recent Office Vitals: BP Readings from Last 3 Encounters:  07/06/20 136/84  09/25/19 (!) 176/99  09/25/19 (!) 149/81   Pulse Readings from Last 3 Encounters:  07/06/20 86  09/25/19 74  05/28/19 74    Wt Readings from Last 3 Encounters:  07/06/20 253 lb (114.8 kg)  09/25/19 251 lb (113.9 kg)  05/28/19 254 lb (115.2 kg)     Kidney Function Lab Results  Component Value Date/Time   CREATININE 1.20 (H) 07/06/2020 02:48 PM   CREATININE 1.22 05/28/2019 11:10 AM   CREATININE 1.32 03/31/2018 01:42 PM    CREATININE 1.21 11/08/2017 01:33 PM   GFR 58.58 (L) 05/28/2019 11:10 AM   GFRNONAA 59 (L) 12/12/2017 01:16 PM   GFRAA >60 12/12/2017 01:16 PM    BMP Latest Ref Rng & Units 07/06/2020 05/28/2019 08/12/2018  Glucose 65 - 99 mg/dL 108(H) 116(H) -  BUN 7 - 25 mg/dL 24 31(H) -  Creatinine 0.70 - 1.18 mg/dL 1.20(H) 1.22 -  BUN/Creat Ratio 6 - 22 (calc) 20 - -  Sodium 135 - 146 mmol/L 136 137 137  Potassium 3.5 - 5.3 mmol/L 4.6 4.9 4.5  Chloride 98 - 110 mmol/L 101 105 -  CO2 20 - 32 mmol/L 21 24 -  Calcium 8.6 - 10.3 mg/dL 10.4(H) 9.4 -    . Current antihypertensive regimen:  o Benazepril 40 mg Take 1 tablet daily o Losartan-hctz 100-25 mg   . How often are you checking your Blood Pressure? Patient states he checks his blood pressure every other day.  . Current home BP readings: 136/84 01/22/21  . What recent interventions/DTPs have been made by any provider to improve Blood Pressure control since last CPP Visit: 01/16/21 Timothy Chapman stopped amlodipine, started losartan-hctz 100-25 mg  . Any recent hospitalizations or ED visits since last visit with CPP? No   . What diet changes have been made to improve Blood Pressure Control?  o Patient states he tries to eat health.  . What exercise is being done to improve your Blood Pressure Control?  o Patient states he tries to exercise but he has a 66 month old baby he takes care of.  Adherence Review: Is the patient currently on ACE/ARB medication? Yes Does the patient have >5 day gap between last estimated fill dates? No  Patient states he has established care with a new primary care at [facility]. Patient saw urologist through [place].  Star Rating Drugs: Benazepril 40 mg last filled 11/25/20 90 DS Glipizide 10 mg last filled 01/16/21 90 DS Metformin 500 mg last filed 01/03/21 30 DS Ozempic 1 mg   [facility] Clinical Pharmacist Assistant [phone]

## 2021-02-10 ENCOUNTER — Other Ambulatory Visit: Payer: Self-pay | Admitting: Family Medicine

## 2021-02-10 DIAGNOSIS — K219 Gastro-esophageal reflux disease without esophagitis: Secondary | ICD-10-CM

## 2021-02-10 DIAGNOSIS — E1142 Type 2 diabetes mellitus with diabetic polyneuropathy: Secondary | ICD-10-CM | POA: Diagnosis not present

## 2021-02-10 DIAGNOSIS — E113292 Type 2 diabetes mellitus with mild nonproliferative diabetic retinopathy without macular edema, left eye: Secondary | ICD-10-CM | POA: Diagnosis not present

## 2021-02-14 DIAGNOSIS — H2513 Age-related nuclear cataract, bilateral: Secondary | ICD-10-CM | POA: Diagnosis not present

## 2021-02-14 DIAGNOSIS — E113292 Type 2 diabetes mellitus with mild nonproliferative diabetic retinopathy without macular edema, left eye: Secondary | ICD-10-CM | POA: Diagnosis not present

## 2021-02-14 DIAGNOSIS — Z7984 Long term (current) use of oral hypoglycemic drugs: Secondary | ICD-10-CM | POA: Diagnosis not present

## 2021-02-14 DIAGNOSIS — H34832 Tributary (branch) retinal vein occlusion, left eye, with macular edema: Secondary | ICD-10-CM | POA: Diagnosis not present

## 2021-02-14 DIAGNOSIS — H5213 Myopia, bilateral: Secondary | ICD-10-CM | POA: Diagnosis not present

## 2021-02-14 DIAGNOSIS — H52223 Regular astigmatism, bilateral: Secondary | ICD-10-CM | POA: Diagnosis not present

## 2021-02-14 DIAGNOSIS — H3581 Retinal edema: Secondary | ICD-10-CM | POA: Diagnosis not present

## 2021-02-22 ENCOUNTER — Ambulatory Visit (INDEPENDENT_AMBULATORY_CARE_PROVIDER_SITE_OTHER): Payer: Medicare HMO

## 2021-02-22 VITALS — Ht 69.0 in | Wt 245.0 lb

## 2021-02-22 DIAGNOSIS — Z0000 Encounter for general adult medical examination without abnormal findings: Secondary | ICD-10-CM

## 2021-02-22 NOTE — Patient Instructions (Signed)
Timothy Chapman , Thank you for taking time to complete your Medicare Wellness Visit. I appreciate your ongoing commitment to your health goals. Please review the following plan we discussed and let me know if I can assist you in the future.   Screening recommendations/referrals: Colonoscopy: Completed 07/07/2018-Due 07/08/2023 Recommended yearly ophthalmology/optometry visit for glaucoma screening and checkup Recommended yearly dental visit for hygiene and checkup  Vaccinations: Influenza vaccine: Up to date Pneumococcal vaccine: Up to date Tdap vaccine: Up to date-Due-10/30/2021 Shingles vaccine: Completed vaccines Covid-19: Up to date  Advanced directives: Declined information today  Conditions/risks identified: See problem list  Next appointment: Follow up in one year for your annual wellness visit.   Preventive Care 20 Years and Older, Male Preventive care refers to lifestyle choices and visits with your health care provider that can promote health and wellness. What does preventive care include?  A yearly physical exam. This is also called an annual well check.  Dental exams once or twice a year.  Routine eye exams. Ask your health care provider how often you should have your eyes checked.  Personal lifestyle choices, including:  Daily care of your teeth and gums.  Regular physical activity.  Eating a healthy diet.  Avoiding tobacco and drug use.  Limiting alcohol use.  Practicing safe sex.  Taking low doses of aspirin every day.  Taking vitamin and mineral supplements as recommended by your health care provider. What happens during an annual well check? The services and screenings done by your health care provider during your annual well check will depend on your age, overall health, lifestyle risk factors, and family history of disease. Counseling  Your health care provider may ask you questions about your:  Alcohol use.  Tobacco use.  Drug use.  Emotional  well-being.  Home and relationship well-being.  Sexual activity.  Eating habits.  History of falls.  Memory and ability to understand (cognition).  Work and work Statistician. Screening  You may have the following tests or measurements:  Height, weight, and BMI.  Blood pressure.  Lipid and cholesterol levels. These may be checked every 5 years, or more frequently if you are over 54 years old.  Skin check.  Lung cancer screening. You may have this screening every year starting at age 80 if you have a 30-pack-year history of smoking and currently smoke or have quit within the past 15 years.  Fecal occult blood test (FOBT) of the stool. You may have this test every year starting at age 18.  Flexible sigmoidoscopy or colonoscopy. You may have a sigmoidoscopy every 5 years or a colonoscopy every 10 years starting at age 76.  Prostate cancer screening. Recommendations will vary depending on your family history and other risks.  Hepatitis C blood test.  Hepatitis B blood test.  Sexually transmitted disease (STD) testing.  Diabetes screening. This is done by checking your blood sugar (glucose) after you have not eaten for a while (fasting). You may have this done every 1-3 years.  Abdominal aortic aneurysm (AAA) screening. You may need this if you are a current or former smoker.  Osteoporosis. You may be screened starting at age 30 if you are at high risk. Talk with your health care provider about your test results, treatment options, and if necessary, the need for more tests. Vaccines  Your health care provider may recommend certain vaccines, such as:  Influenza vaccine. This is recommended every year.  Tetanus, diphtheria, and acellular pertussis (Tdap, Td) vaccine. You may need  a Td booster every 10 years.  Zoster vaccine. You may need this after age 53.  Pneumococcal 13-valent conjugate (PCV13) vaccine. One dose is recommended after age 68.  Pneumococcal  polysaccharide (PPSV23) vaccine. One dose is recommended after age 20. Talk to your health care provider about which screenings and vaccines you need and how often you need them. This information is not intended to replace advice given to you by your health care provider. Make sure you discuss any questions you have with your health care provider. Document Released: 09/30/2015 Document Revised: 05/23/2016 Document Reviewed: 07/05/2015 Elsevier Interactive Patient Education  2017 [employer] Prevention in the Home Falls can cause injuries. They can happen to people of all ages. There are many things you can do to make your home safe and to help prevent falls. What can I do on the outside of my home?  Regularly fix the edges of walkways and driveways and fix any cracks.  Remove anything that might make you trip as you walk through a door, such as a raised step or threshold.  Trim any bushes or trees on the path to your home.  Use bright outdoor lighting.  Clear any walking paths of anything that might make someone trip, such as rocks or tools.  Regularly check to see if handrails are loose or broken. Make sure that both sides of any steps have handrails.  Any raised decks and porches should have guardrails on the edges.  Have any leaves, snow, or ice cleared regularly.  Use sand or salt on walking paths during winter.  Clean up any spills in your garage right away. This includes oil or grease spills. What can I do in the bathroom?  Use night lights.  Install grab bars by the toilet and in the tub and shower. Do not use towel bars as grab bars.  Use non-skid mats or decals in the tub or shower.  If you need to sit down in the shower, use a plastic, non-slip stool.  Keep the floor dry. Clean up any water that spills on the floor as soon as it happens.  Remove soap buildup in the tub or shower regularly.  Attach bath mats securely with double-sided non-slip rug  tape.  Do not have throw rugs and other things on the floor that can make you trip. What can I do in the bedroom?  Use night lights.  Make sure that you have a light by your bed that is easy to reach.  Do not use any sheets or blankets that are too big for your bed. They should not hang down onto the floor.  Have a firm chair that has side arms. You can use this for support while you get dressed.  Do not have throw rugs and other things on the floor that can make you trip. What can I do in the kitchen?  Clean up any spills right away.  Avoid walking on wet floors.  Keep items that you use a lot in easy-to-reach places.  If you need to reach something above you, use a strong step stool that has a grab bar.  Keep electrical cords out of the way.  Do not use floor polish or wax that makes floors slippery. If you must use wax, use non-skid floor wax.  Do not have throw rugs and other things on the floor that can make you trip. What can I do with my stairs?  Do not leave any items on the  stairs.  Make sure that there are handrails on both sides of the stairs and use them. Fix handrails that are broken or loose. Make sure that handrails are as long as the stairways.  Check any carpeting to make sure that it is firmly attached to the stairs. Fix any carpet that is loose or worn.  Avoid having throw rugs at the top or bottom of the stairs. If you do have throw rugs, attach them to the floor with carpet tape.  Make sure that you have a light switch at the top of the stairs and the bottom of the stairs. If you do not have them, ask someone to add them for you. What else can I do to help prevent falls?  Wear shoes that:  Do not have high heels.  Have rubber bottoms.  Are comfortable and fit you well.  Are closed at the toe. Do not wear sandals.  If you use a stepladder:  Make sure that it is fully opened. Do not climb a closed stepladder.  Make sure that both sides of the  stepladder are locked into place.  Ask someone to hold it for you, if possible.  Clearly mark and make sure that you can see:  Any grab bars or handrails.  First and last steps.  Where the edge of each step is.  Use tools that help you move around (mobility aids) if they are needed. These include:  Canes.  Walkers.  Scooters.  Crutches.  Turn on the lights when you go into a dark area. Replace any light bulbs as soon as they burn out.  Set up your furniture so you have a clear path. Avoid moving your furniture around.  If any of your floors are uneven, fix them.  If there are any pets around you, be aware of where they are.  Review your medicines with your doctor. Some medicines can make you feel dizzy. This can increase your chance of falling. Ask your doctor what other things that you can do to help prevent falls. This information is not intended to replace advice given to you by your health care provider. Make sure you discuss any questions you have with your health care provider. Document Released: 06/30/2009 Document Revised: 02/09/2016 Document Reviewed: 10/08/2014 Elsevier Interactive Patient Education  2017 [employer].

## 2021-02-22 NOTE — Progress Notes (Signed)
Subjective:   Timothy Chapman is a 75 y.o. male who presents for Medicare Annual/Subsequent preventive examination.  I connected with Dallin today by telephone and verified that I am speaking with the correct person using two identifiers. Location patient: home Location provider: work Persons participating in the virtual visit: patient, Marine scientist.    I discussed the limitations, risks, security and privacy concerns of performing an evaluation and management service by telephone and the availability of in person appointments. I also discussed with the patient that there may be a patient responsible charge related to this service. The patient expressed understanding and verbally consented to this telephonic visit.    Interactive audio and video telecommunications were attempted between this provider and patient, however failed, due to patient having technical difficulties OR patient did not have access to video capability.  We continued and completed visit with audio only.  Some vital signs may be absent or patient reported.   Time Spent with patient on telephone encounter: 25 minutes   Review of Systems     Cardiac Risk Factors include: male gender;advanced age (>90men, >65 women);diabetes mellitus;hypertension;obesity (BMI >30kg/m2)     Objective:    Today's Vitals   02/22/21 1258 02/22/21 1259  Weight: 245 lb (111.1 kg)   Height: 5\' 9"  (1.753 m)   PainSc:  4    Body mass index is 36.18 kg/m.  Advanced Directives 02/22/2021 09/25/2019 12/12/2017 10/02/2017  Does Patient Have a Medical Advance Directive? No No No No  Would patient like information on creating a medical advance directive? No - Patient declined No - Patient declined No - Patient declined No - Patient declined    Current Medications (verified) Outpatient Encounter Medications as of 02/22/2021  Medication Sig  . Acetylcarnitine HCl 500 MG CAPS Take 500 mg by mouth daily.  . Alcohol Swabs (B-D SINGLE USE SWABS REGULAR)  PADS USE PRIOR TO INSULIN DOSE  . Alpha-Lipoic Acid 600 MG CAPS Take 600 mg by mouth daily.  Marland Kitchen aspirin 81 MG chewable tablet Chew 1 tablet (81 mg total) by mouth daily.  . benazepril (LOTENSIN) 40 MG tablet Take 1 tablet (40 mg total) by mouth daily.  . diclofenac (VOLTAREN) 75 MG EC tablet TAKE 1 TABLET TWICE A DAY  AS NEEDED FOR JOINT PAINS (Patient taking differently: TAKE 1 TABLET TWICE A DAY  AS NEEDED FOR JOINT PAINS)  . famotidine (PEPCID) 20 MG tablet Take 1 tablet (20 mg total) by mouth 2 (two) times daily.  . Lancets (ONETOUCH DELICA PLUS [id number]) MISC USE TWO TIMES A DAY TO     CHECK BLOOD SUGAR  . losartan-hydrochlorothiazide (HYZAAR) 100-25 MG tablet Take 1 tablet by mouth daily.  . melatonin 5 MG TABS Take 5 mg by mouth at bedtime.   . metFORMIN (GLUCOPHAGE-XR) 500 MG 24 hr tablet Take 2 tablets (1,000 mg total) by mouth 2 (two) times daily after a meal. Take with food  . Multiple Vitamin (MULTIVITAMIN) tablet Take 1 tablet by mouth daily.  . niacin (NIASPAN) 1000 MG CR tablet Take 1,000 mg by mouth at bedtime.  Marland Kitchen omeprazole (PRILOSEC) 40 MG capsule TAKE 1 CAPSULE DAILY  . ONETOUCH VERIO test strip USE AS INSTRUCTED  . PRALUENT 75 MG/ML SOAJ INJECT 75 MG SUBCUTANEOUSLY EVERY 14 DAYS  . Semaglutide (RYBELSUS) 14 MG TABS Take 1 tablet by mouth daily.  . tadalafil (CIALIS) 10 MG tablet Take 1 tablet (10 mg total) by mouth daily as needed for erectile dysfunction. Max 20 mg total  per day  . tadalafil (CIALIS) 5 MG tablet Take 1 tablet (5 mg total) by mouth daily.  . vitamin C (ASCORBIC ACID) 500 MG tablet Take 500 mg by mouth daily.  Marland Kitchen amLODipine (NORVASC) 10 MG tablet TAKE 1 TABLET DAILY (Patient taking differently: Take 5 mg by mouth in the morning and at bedtime.)  . glipiZIDE (GLUCOTROL) 10 MG tablet TAKE 2 TABLETS TWICE A DAY  BEFORE A MEAL  . hydrochlorothiazide (HYDRODIURIL) 25 MG tablet Take 0.5 tablets (12.5 mg total) by mouth daily. (Patient taking differently: Take 25 mg  by mouth daily.)  . Turmeric 450 MG CAPS Take 1 tablet by mouth daily.   . [DISCONTINUED] Semaglutide, 1 MG/DOSE, (OZEMPIC, 1 MG/DOSE,) 2 MG/1.5ML SOPN Inject 1 mg into the skin once a week. (Patient not taking: Reported on 02/22/2021)   No facility-administered encounter medications on file as of 02/22/2021.    Allergies (verified) Codeine, Farxiga [dapagliflozin], and Statins   History: Past Medical History:  Diagnosis Date  . AKI (acute kidney injury) ([place])   . Anxiety   . Arthritis   . Barrett esophagus   . Depression   . Diabetes mellitus without complication ([place])    type 2  . 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 GERD (gastroesophageal reflux disease)   . Hypertension   . Neuromuscular disorder ([facility])    neuropathy but being treated  . PONV (postoperative nausea and vomiting)   . Sleep apnea    has a sleep study but refuses that he has sleep study, not wearing CPAP   Past Surgical History:  Procedure Laterality Date  . BACK SURGERY     X2 - lower  . CHOLECYSTECTOMY N/A 12/20/2017   Procedure: LAPAROSCOPIC CHOLECYSTECTOMY WITH INTRAOPERATIVE CHOLANGIOGRAM POSSIBLE OPEN;  Surgeon: Fanny Skates, MD;  Location: [facility];  Service: General;  Laterality: N/A;  . COLONOSCOPY    . ESOPHAGOGASTRODUODENOSCOPY    . IR RADIOLOGIST EVAL & MGMT  10/30/2017  . IR RADIOLOGIST EVAL & MGMT  11/14/2017  . PROSTATE SURGERY  2017   uro lift per pt.  . REPLACEMENT TOTAL KNEE Left   . ROTATOR CUFF REPAIR     Bil   Family History  Problem Relation Age of Onset  . Heart failure Father   . Heart disease Father   . Emphysema Mother   . Colon cancer Maternal Grandmother    Social History   Socioeconomic History  . Marital status: Married    Spouse name: Not on file  . Number of children: Not on file  . Years of education: Not on file  . Highest education level: Not on file  Occupational History  . Not on file  Tobacco Use  . Smoking status: Former Smoker     Quit date: 09/18/1975    Years since quitting: 45.4  . Smokeless tobacco: Never Used  Vaping Use  . Vaping Use: Never used  Substance and Sexual Activity  . Alcohol use: Not Currently    Alcohol/week: 0.0 standard drinks    Comment: Rarely  . Drug use: Never  . Sexual activity: Not on file  Other Topics Concern  . Not on file  Social History Narrative  . Not on file   Social Determinants of Health   Financial Resource Strain: Medium Risk  . Difficulty of Paying Living Expenses: Somewhat hard  Food Insecurity: No Food Insecurity  . Worried About Charity fundraiser in the Last Year: Never true  . Ran  Out of Food in the Last Year: Never true  Transportation Needs: No Transportation Needs  . Lack of Transportation (Medical): No  . Lack of Transportation (Non-Medical): No  Physical Activity: Inactive  . Days of Exercise per Week: 0 days  . Minutes of Exercise per Session: 0 min  Stress: No Stress Concern Present  . Feeling of Stress : Only a little  Social Connections: Moderately Isolated  . Frequency of Communication with Friends and Family: More than three times a week  . Frequency of Social Gatherings with Friends and Family: More than three times a week  . Attends Religious Services: Never  . Active Member of Clubs or Organizations: No  . Attends Archivist Meetings: Never  . Marital Status: Married    Tobacco Counseling Counseling given: Not Answered   Clinical Intake:  Pre-visit preparation completed: Yes  Pain : 0-10 Pain Score: 4  Pain Type: Chronic pain Pain Location: Back (and hip pain) Pain Onset: More than a month ago Pain Frequency: Constant     Nutritional Status: BMI > 30  Obese Nutritional Risks: None Diabetes: Yes CBG done?: No Did pt. bring in CBG monitor from home?: No (phone visit)  How often do you need to have someone help you when you read instructions, pamphlets, or other written materials from your doctor or pharmacy?: 1 -  Never  Diabetes:  Is the patient diabetic?  Yes  If diabetic, was a CBG obtained today?  No  Did the patient bring in their glucometer from home?  No phone visit How often do you monitor your CBG's? daily.   Financial Strains and Diabetes Management:  Are you having any financial strains with the device, your supplies or your medication? No .  Does the patient want to be seen by Chronic Care Management for management of their diabetes?  No  Would the patient like to be referred to a Nutritionist or for Diabetic Management?  No   Diabetic Exams:  Diabetic Eye Exam: Completed 2022-per patient.   Diabetic Foot Exam: Pt has been advised about the importance in completing this exam. To be completed by PCP    Interpreter Needed?: No  Information entered by :: Caroleen Hamman LPN   Activities of Daily Living In your present state of health, do you have any difficulty performing the following activities: 02/22/2021  Hearing? N  Vision? N  Difficulty concentrating or making decisions? N  Walking or climbing stairs? N  Dressing or bathing? N  Doing errands, shopping? N  Preparing Food and eating ? N  Using the Toilet? N  In the past six months, have you accidently leaked urine? N  Do you have problems with loss of bowel control? N  Managing your Medications? N  Managing your Finances? N  Housekeeping or managing your Housekeeping? N  Some recent data might be hidden    Patient Care Team: Copland, Gay Filler, MD as PCP - General (Family Medicine) Cherre Robins, PharmD (Pharmacist)  Indicate any recent Medical Services you may have received from other than [facility] providers in the past year (date may be approximate).     Assessment:   This is a routine wellness examination for [facility].  Hearing/Vision screen  Hearing Screening   125Hz  250Hz  500Hz  1000Hz  2000Hz  3000Hz  4000Hz  6000Hz  8000Hz   Right ear:           Left ear:           Comments: No issues  Vision Screening  Comments:  Wears glasses Last eye exam-2022-[facility]  Dietary issues and exercise activities discussed: Exercise limited by: orthopedic condition(s) (back pain)  Goals Addressed            This Visit's Progress   . Patient Stated       Maintain healthy eating       Depression Screen PHQ 2/9 Scores 02/22/2021 09/25/2019 07/08/2017 06/20/2016  PHQ - 2 Score 0 0 0 0  Exception Documentation - - Patient refusal -    Fall Risk Fall Risk  02/22/2021 09/25/2019 07/08/2017 06/20/2016  Falls in the past year? 1 1 Yes No  Number falls in past yr: 1 1 2  or more -  Injury with Fall? 0 0 No -  Risk for fall due to : History of fall(s) - Impaired balance/gait -  Follow up Falls prevention discussed Education provided;Falls prevention discussed Falls prevention discussed -    FALL RISK PREVENTION PERTAINING TO THE HOME:  Any stairs in or around the home? Yes  If so, are there any without handrails? No  Home free of loose throw rugs in walkways, pet beds, electrical cords, etc? Yes  Adequate lighting in your home to reduce risk of falls? Yes   ASSISTIVE DEVICES UTILIZED TO PREVENT FALLS:  Life alert? No  Use of a cane, walker or w/c? No  Grab bars in the bathroom? No  Shower chair or bench in shower? No  Elevated toilet seat or a handicapped toilet? No   TIMED UP AND GO:  Was the test performed? No . Phone visit   Cognitive Function:Normal cognitive status assessed by this Nurse Health Advisor. No abnormalities found.          Immunizations Immunization History  Administered Date(s) Administered  . Fluad Quad(high Dose 65+) 05/28/2019, 07/06/2020  . Influenza, High Dose Seasonal PF 06/20/2016, 07/08/2017  . PFIZER Comirnaty(Gray Top)Covid-19 Tri-Sucrose Vaccine 11/15/2019, 12/15/2019, 09/02/2020  . PFIZER(Purple Top)SARS-COV-2 Vaccination 11/15/2019, 12/15/2019, 09/02/2020  . Pneumococcal Conjugate-13 06/20/2016  . Pneumococcal Polysaccharide-23 05/28/2019  . Zoster  Recombinat (Shingrix) 02/19/2020, 05/03/2020    TDAP status: Up to date  Flu Vaccine status: Up to date  Pneumococcal vaccine status: Up to date  Covid-19 vaccine status: Completed vaccines  Qualifies for Shingles Vaccine? No   Zostavax completed No   Shingrix Completed?: Yes  Screening Tests Health Maintenance  Topic Date Due  . Pneumococcal Vaccine 89-83 Years old (1 of 4 - PCV13) Never done  . OPHTHALMOLOGY EXAM  07/02/2019  . FOOT EXAM  10/09/2019  . COVID-19 Vaccine (4 - Booster for Pfizer series) 12/01/2020  . HEMOGLOBIN A1C  01/04/2021  . INFLUENZA VACCINE  04/17/2021  . TETANUS/TDAP  10/30/2021  . COLONOSCOPY (Pts 45-70yrs Insurance coverage will need to be confirmed)  07/07/2028  . Hepatitis C Screening  Completed  . PNA vac Low Risk Adult  Completed  . Zoster Vaccines- Shingrix  Completed  . HPV VACCINES  Aged Out    Health Maintenance  Health Maintenance Due  Topic Date Due  . Pneumococcal Vaccine 59-27 Years old (1 of 4 - PCV13) Never done  . OPHTHALMOLOGY EXAM  07/02/2019  . FOOT EXAM  10/09/2019  . COVID-19 Vaccine (4 - Booster for Pfizer series) 12/01/2020  . HEMOGLOBIN A1C  01/04/2021    Colorectal cancer screening: Type of screening: Colonoscopy. Completed 07/07/2018. Repeat every 5 years  Lung Cancer Screening: (Low Dose CT Chest recommended if Age 35-80 years, 30 pack-year currently smoking OR have quit w/in 15years.) does not qualify.  Additional Screening:  Hepatitis C Screening:  Completed 10/02/2017  Vision Screening: Recommended annual ophthalmology exams for early detection of glaucoma and other disorders of the eye. Is the patient up to date with their annual eye exam?  Yes  Who is the provider or what is the name of the office in which the patient attends annual eye exams? Dr. Manuella Ghazi  Dental Screening: Recommended annual dental exams for proper oral hygiene  Community Resource Referral / Chronic Care Management: CRR required this  visit?  No   CCM required this visit?  No      Plan:     I have personally reviewed and noted the following in the patient's chart:   . Medical and social history . Use of alcohol, tobacco or illicit drugs  . Current medications and supplements including opioid prescriptions. Patient is not currently taking opioid prescriptions. . Functional ability and status . Nutritional status . Physical activity . Advanced directives . List of other physicians . Hospitalizations, surgeries, and ER visits in previous 12 months . Vitals . Screenings to include cognitive, depression, and falls . Referrals and appointments  In addition, I have reviewed and discussed with patient certain preventive protocols, quality metrics, and best practice recommendations. A written personalized care plan for preventive services as well as general preventive health recommendations were provided to patient.   Due to this being a telephonic visit, the after visit summary with patients personalized plan was offered to patient via mail or my-chart. Patient would like to access on my-chart.   Marta Antu, LPN   04/19/1516   Nurse Notes: None

## 2021-03-02 DIAGNOSIS — E119 Type 2 diabetes mellitus without complications: Secondary | ICD-10-CM | POA: Diagnosis not present

## 2021-03-02 DIAGNOSIS — I10 Essential (primary) hypertension: Secondary | ICD-10-CM | POA: Diagnosis not present

## 2021-03-14 ENCOUNTER — Other Ambulatory Visit: Payer: Self-pay | Admitting: Family Medicine

## 2021-03-14 DIAGNOSIS — E785 Hyperlipidemia, unspecified: Secondary | ICD-10-CM

## 2021-03-25 DIAGNOSIS — C61 Malignant neoplasm of prostate: Secondary | ICD-10-CM

## 2021-03-25 HISTORY — DX: Malignant neoplasm of prostate: C61

## 2021-03-26 DIAGNOSIS — U071 COVID-19: Secondary | ICD-10-CM | POA: Diagnosis not present

## 2021-03-28 DIAGNOSIS — U071 COVID-19: Secondary | ICD-10-CM | POA: Diagnosis not present

## 2021-04-04 ENCOUNTER — Ambulatory Visit (INDEPENDENT_AMBULATORY_CARE_PROVIDER_SITE_OTHER): Payer: Medicare HMO | Admitting: Pharmacist

## 2021-04-04 DIAGNOSIS — E1140 Type 2 diabetes mellitus with diabetic neuropathy, unspecified: Secondary | ICD-10-CM | POA: Diagnosis not present

## 2021-04-04 DIAGNOSIS — K219 Gastro-esophageal reflux disease without esophagitis: Secondary | ICD-10-CM

## 2021-04-04 DIAGNOSIS — R972 Elevated prostate specific antigen [PSA]: Secondary | ICD-10-CM

## 2021-04-04 DIAGNOSIS — E785 Hyperlipidemia, unspecified: Secondary | ICD-10-CM | POA: Diagnosis not present

## 2021-04-04 DIAGNOSIS — I10 Essential (primary) hypertension: Secondary | ICD-10-CM | POA: Diagnosis not present

## 2021-04-05 NOTE — Chronic Care Management (AMB) (Signed)
Chronic Care Management Pharmacy Note  04/05/2021 Name:  Timothy Chapman MRN:  [id number] DOB:  1946/07/31  Summary: Patient had seen PCP with [facility] but states today he would like to continues with Dr Lorelei Pont. Patient is aware he has to choose 1 PCP.  A1c in May was 10.6%; patient has started low CHO diet and has lost 15 lbs Patient states falls having improved with stopping amlodipine but still having occasional dizziness  Recommendations/Changes made from today's visit: Discuss possibly increasing Ozempic to 43m weekly with [facility] today Trial of losartan HCTZ 100/279m- take 1/2 tablet instead of 1 tablet daily to see if dizziness improves. Continue to check BP 2 to 3 times per week.   Plan:CMA will follow up with phone call to check dizziness, BP and BG in 2 weeks.   Subjective: Timothy SHARRARs an [age].o. year old male who is a primary patient of Copland, JeGay FillerMD.  The CCM team was consulted for assistance with disease management and care coordination needs.    Engaged with patient by telephone for follow up visit in response to provider referral for pharmacy case management and/or care coordination services.   Consent to Services:  The patient was given information about Chronic Care Management services, agreed to services, and gave verbal consent prior to initiation of services.  Please see initial visit note for detailed documentation.   Patient Care Team: Copland, JeGay FillerMD as PCP - General (Family Medicine) EcCherre RobinsPharmD (Pharmacist)  Recent office visits: 03/29/2021 - phone call - reported metallic taste with Paxlovid; recommended he stop 03/28/2021 - Dr WeJoyce Gross COVID positive on 03/24/2021. Prescribed Paxlovid 30050mid for 5 days and benzonatate 200m66m to tid prn for 14 days.  03/02/2021 - Dr WeslJoyce Grossee for Type 2 DM and HTN; no med changes.  02/10/2021 - Kiker, FPN - elevated BG; changed Ozempic to Rybelsus 14mg46mily 01/16/2021 - Dr TurneRadford Paxtablish care - stopped amlodipine and benazepril; started losartan HCTZ 100/25mg 32my; Stopped glipizide   Recent consult visits: 02/20/2021 - [facility] cliCrismanc - Elevated PSA / malignant neoplasm of prostate. No med changes - has follow up 04/06/2021 02/14/2021 - Ophthalmology  - Dr Shah fManuella Ghaziiabetic eye exam and [facility]: None in previous 6 months  Objective:  Lab Results  Component Value Date   CREATININE 1.20 (H) 07/06/2020   CREATININE 1.22 05/28/2019   CREATININE 1.32 03/31/2018    Lab Results  Component Value Date   HGBA1C 7.8 (H) 07/06/2020   Last diabetic Eye exam: No results found for: HMDIABEYEEXA  Last diabetic Foot exam: No results found for: HMDIABFOOTEX      Component Value Date/Time   CHOL 209 (H) 07/06/2020 1448   TRIG 422 (H) 07/06/2020 1448   HDL 34 (L) 07/06/2020 1448   CHOLHDL 6.1 (H) 07/06/2020 1448   VLDL 57.0 (H) 07/09/2017 0826   LDLCALC  07/06/2020 1448     Comment:     . LDL cholesterol not calculated. Triglyceride levels greater than 400 mg/dL invalidate calculated LDL results. . Reference range: <100 . Desirable range <100 mg/dL for primary prevention;   <70 mg/dL for patients with CHD or diabetic patients  with > or = 2 CHD risk factors. . LDL-Marland Kitchen is now calculated using the Martin-Hopkins  calculation, which is a validated novel method providing  better accuracy than the Friedewald equation in the  estimation of LDL-C.  Cresenciano Genre et al. Annamaria Helling. 0998;338(25): 2061-2068  (http://education.Quest[employer].com/faq/FAQ164)    LDLDIRECT 88.0 05/28/2019 1110    Hepatic Function Latest Ref Rng & Units 07/06/2020 05/28/2019 03/31/2018  Total Protein 6.1 - 8.1 g/dL 7.8 7.5 7.6  Albumin 3.5 - 5.2 g/dL - 4.4 4.6  AST 10 - 35 U/L 35 20 26  ALT 9 - 46 U/L 70(H) 27 34  Alk Phosphatase 39 - 117 U/L - 40 39  Total Bilirubin 0.2 - 1.2 mg/dL 0.4 0.3 0.3  Bilirubin, Direct 0.1 - 0.5 mg/dL -  - -    No results found for: TSH, FREET4  CBC Latest Ref Rng & Units 07/06/2020 05/28/2019 03/31/2018  WBC 3.8 - 10.8 Thousand/uL 8.9 7.2 7.4  Hemoglobin 13.2 - 17.1 g/dL 13.3 12.2(L) 12.3(L)  Hematocrit 38.5 - 50.0 % 40.7 37.5(L) 36.2(L)  Platelets 140 - 400 Thousand/uL 247 276.0 294.0    No results found for: VD25OH  Clinical ASCVD: Yes  The ASCVD Risk score Mikey Bussing DC Jr., et al., 2013) failed to calculate for the following reasons:   The valid total cholesterol range is 130 to 320 mg/dL    Social History   Tobacco Use  Smoking Status Former   Types: Cigarettes   Quit date: 09/18/1975   Years since quitting: 45.5  Smokeless Tobacco Never   BP Readings from Last 3 Encounters:  07/06/20 136/84  09/25/19 (!) 176/99  09/25/19 (!) 149/81   Pulse Readings from Last 3 Encounters:  07/06/20 86  09/25/19 74  05/28/19 74   Wt Readings from Last 3 Encounters:  02/22/21 245 lb (111.1 kg)  07/06/20 253 lb (114.8 kg)  09/25/19 251 lb (113.9 kg)    Assessment: Review of patient past medical history, allergies, medications, health status, including review of consultants reports, laboratory and other test data, was performed as part of comprehensive evaluation and provision of chronic care management services.   SDOH:  (Social Determinants of Health) assessments and interventions performed:  SDOH Interventions    Flowsheet Row Most Recent Value  SDOH Interventions   Physical Activity Interventions Other (Comments)  [started using treadmill recently]       CCM Care Plan  Allergies  Allergen Reactions   Codeine     hallucinations   Farxiga [Dapagliflozin] Other (See Comments)    Genital infection and irritation   Statins     Has tried several cannot tolerate    Medications Reviewed Today     Reviewed by Cherre Robins, PharmD (Pharmacist) on 04/04/21 at 50  Med List Status: <None>   Medication Order Taking? Sig Documenting Provider Last Dose Status Informant   Acetylcarnitine HCl 500 MG CAPS [id number] Yes Take 500 mg by mouth daily. [provider] Taking Active Self           Med Note Rosemarie Beath, MELISSA B   Thu Nov 21, 2017 12:03 PM)    Alcohol Swabs (B-D SINGLE USE SWABS REGULAR) PADS [id number] Yes USE PRIOR TO INSULIN DOSE Copland, Gay Filler, MD Taking Active   Alpha-Lipoic Acid 600 MG CAPS [id number] Yes Take 600 mg by mouth daily. [provider] Taking Active Self           Med Note Antony Contras, Manpreet Kemmer B   Tue Apr 04, 2021  1:16 PM)    aspirin 81 MG chewable tablet [id number] Yes Chew 1 tablet (81 mg total) by mouth daily. Shelly Coss, MD Taking Active Self  diclofenac (VOLTAREN) 75 MG EC tablet [id number] Yes TAKE 1 TABLET TWICE  A DAY  AS NEEDED FOR JOINT PAINS  Patient taking differently: TAKE 1 TABLET TWICE A DAY  AS NEEDED FOR JOINT PAINS   Copland, Gay Filler, MD Taking Active            Med Note Lynita Lombard Sep 29, 2020  3:10 PM) Recommended not to take by [facility]  famotidine (PEPCID) 20 MG tablet [id number] Yes Take 1 tablet (20 mg total) by mouth 2 (two) times daily. Copland, Gay Filler, MD Taking Active            Med Note Theodosia Blender Apr 04, 2021  1:18 PM) Taking as needed  Lancets (ONETOUCH DELICA PLUS OZDGUY40H) Pinson [id number] Yes USE TWO TIMES A DAY TO     CHECK BLOOD SUGAR Copland, Gay Filler, MD Taking Active   losartan-hydrochlorothiazide (HYZAAR) 100-25 MG tablet [id number] Yes Take 1 tablet by mouth daily. [provider] Taking Active   melatonin 5 MG TABS [id number] Yes Take 5 mg by mouth at bedtime.  [provider] Taking Active Self  metFORMIN (GLUCOPHAGE-XR) 500 MG 24 hr tablet [id number] Yes Take 2 tablets (1,000 mg total) by mouth 2 (two) times daily after a meal. Take with food Copland, Gay Filler, MD Taking Active   Multiple Vitamin (MULTIVITAMIN) tablet [id number] Yes Take 1 tablet by mouth daily. [provider] Taking Active Self  niacin (NIASPAN) 1000 MG CR tablet  [id number] Yes Take 1,000 mg by mouth at bedtime. [provider] Taking Active   omeprazole (PRILOSEC) 40 MG capsule [id number] Yes TAKE 1 CAPSULE DAILY Copland, Gay Filler, MD Taking Active   [facility] VERIO test strip [id number] Yes USE AS INSTRUCTED Copland, Gay Filler, MD Taking Active   PRALUENT 75 MG/ML Darden Palmer [id number] Yes INJECT 75MG INTO THE SKIN EVERY 14 DAYS Copland, Gay Filler, MD Taking Active   tadalafil (CIALIS) 10 MG tablet [id number] Yes Take 1 tablet (10 mg total) by mouth daily as needed for erectile dysfunction. Max 20 mg total per day Copland, Gay Filler, MD Taking Active   tadalafil (CIALIS) 5 MG tablet [id number] Yes Take 1 tablet (5 mg total) by mouth daily. Copland, Gay Filler, MD Taking Active   vitamin C (ASCORBIC ACID) 500 MG tablet [id number] Yes Take 500 mg by mouth daily. 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   Paroxysmal atrial fibrillation ([place]) 12/16/2017   SBO (small bowel obstruction) ([place]) 10/08/2017   ATN (acute tubular necrosis) ([place]) 10/08/2017   Septic shock ([place]) 10/04/2017   Acute cholecystitis s/p perc cholecystomy drainage 10/04/2017    Acute respiratory failure with hypoxia (H[facility])    Chronic back pain 02/08/2016   Controlled type 2 diabetes mellitus with diabetic neuropathy, without long-term current use of insulin ([place]) 02/08/2016   Insomnia 02/08/2016   History of diverticulitis 02/08/2016   HYPERTENSION, BENIGN ESSENTIAL, UNCONTROLLED 03/12/2009   ABNORMAL ELECTROCARDIOGRAM 03/12/2009    Immunization History  Administered Date(s) Administered   Fluad Quad(high Dose 65+) 05/28/2019, 07/06/2020   Influenza, High Dose Seasonal PF 06/20/2016, 07/08/2017   PFIZER Comirnaty(Gray Top)Covid-19 Tri-Sucrose Vaccine 11/15/2019, 12/15/2019, 09/02/2020   PFIZER(Purple Top)SARS-COV-2 Vaccination 11/15/2019, 12/15/2019, 09/02/2020   Pneumococcal  Conjugate-13 06/20/2016   Pneumococcal Polysaccharide-23 05/28/2019   Zoster Recombinat (Shingrix) 02/19/2020, 05/03/2020    Conditions to be addressed/monitored: HTN, HLD, DMII, and arthritis / pain; GERD, BPH /  ED;   Care Plan : General Pharmacy (Adult)  Updates made by Cherre Robins, PHARMD since 04/05/2021 12:00 AM     Problem: Chronic Disease Management support, education, and care coordination needs related to HTN, Hyperlipidemia, Diabetes, Insomnia, GERD, Pain, BPH/ED   Note:   Current Barriers:  Unable to maintain control of type 2 DM Does not notify 55 office if had questions or concerns In need of Chronic Disease Management support, education, and care coordination related to Hypertension, Hyperlipidemia, Diabetes, GERD (with Barrett's Esophagus); NASH; Pain, BPH/ED History of low adherence to maintenance medications  Pharmacist Clinical Goal(s):  Over the next 180 days, patient will achieve adherence to monitoring guidelines and medication adherence to achieve therapeutic efficacy achieve control of diabetes and hypertension as evidenced by A1c < 7.0% and BP <140/90 adhere to prescribed medication regimen as evidenced by refill history and meeting of goal mentioned below  through collaboration with PharmD and provider.  Better understand medication regimen and improve adherence to medication therapy Maintain communication with PCP's office (he must choose 1 PCP - per patient he wants to stay with Dr Lorelei Pont)   Interventions: 1:1 collaboration with Copland, Gay Filler, MD regarding development and update of comprehensive plan of care as evidenced by provider attestation and co-signature Inter-disciplinary care team collaboration (see longitudinal plan of care) Comprehensive medication review performed; medication list updated in electronic medical record  Diabetes: Uncontrolled - A1c goal <7.0% Current regimen:  Ozempic - inject 24m weekly Metformin XR 5034m-  take 2 tablets = 100020mwice a day with morning and evening meals Was unable to tolerate FarIrancaused irritation and infection of genitals; Rybelsus - cost and more nasuea than Ozempic Patient reports recent home BG: 120 - 200;  Denies BG <80 / hypoglycemic symptoms Reviewed diet - patient denies eating sugar or drinking juices or sodas; tries to limit CHO intake; Has a newborn at home and still trying to get use to scheduled.  No regular exercise Interventions: Discussed A1c and blood glucose goals Reviewed home blood glucose readings and reviewed goals  Fasting blood glucose goal (before meals) = 80 to 130 Blood glucose goal after a meal = less than 180  Discussed medication options - might be able to increase Ozempic to 2mg48mpatient to discuss with [facility] pTop[facility] Lakesician today at appt.; retrial of Rybelsus is also an option Discussed cost assistance options but since patient has VA benefits he does not qualify for patient assistance programs unless medication is not on VA f[place]ulary  Hypertension: BP Readings from Last 3 Encounters:  07/06/20 136/84  09/25/19 (!) 176/99  09/25/19 (!) 149/81  Variable office BP readings - goal BP <140/90 Home BP readings have been 136/80; 130/80  Current regimen:  Losartan HCTZ 100/25mg41make 1 tablet daily Previous medications tried: benazepril - switched to losartan; amlodipine - stopped due to dizziness.  Patient reports he was falling a lot when he was taking amlodipine; Dr WesleJoyce Gross AtriuGarza-[place]inas IIped amlodipine in May and patient reports he is no longer falling but still has some dizziness (but has improved)  He is concerned about dizziness as he is home with his newborn most days.  Interventions: Recommended trial of taking 1/2 tablet losartan HCTZ daily for 1 week to see if dizziness improves Recommended patient check blood pressure 1 to 3 times per week, document, and provide at future appointments Recommended he [facility] dizziness  [facility] Recommended  appointment with Dr CoplaLorelei Ponthe  states he will call back after [facility] appt today.  Discussed blood pressure goal Discussed diet and exercise Ensure daily salt intake < 2300 mg/day  Hyperlipidemia with elevated triglycerides Lipid Panel     Component Value Date/Time   CHOL 209 (H) 07/06/2020 1448   TRIG 422 (H) 07/06/2020 1448   HDL 34 (L) 07/06/2020 1448   CHOLHDL 6.1 (H) 07/06/2020 1448   VLDL 57.0 (H) 07/09/2017 0826   LDLDIRECT 88.0 05/28/2019 1110   LDL goal < 100 and Triglycerides <150 Current regimen:  Niaspan CR 1050m at bedtime - take aspirin 824mprior to niaspan to decrease flushing Salmon Oil 100071mwice daily per [facility]  Praluent 47m3mery 14 days  Interventions: Discussed lipid goals and risk associated with elevated TRIG Reviewed foods that can increase triglycerides; encouraged patient to continue to limit intake of alcohol, sweets / sugar, potatoes, bread, rice and pasta Encouraged increased physical activity  Chronic Back Pain / Osteoarthritis Controlled Current regimen:  Alpha lipoic acid 600mg41mly  Diclofenac 47mg 59mo twice a day if needed for pain Patient self care activities - Over the next 90 days, patient will: Maintain medication regimen for arthritis    GERD Controlled Current regimen:  Famotidine 20mg t39m daily Omeprazole 40mg da64mat bedtime Patient self care activities - Over the next 90 days, patient will: Take omeprazole in morning on empty stomach and take famotidine at bedtime  Insomnia:  Current Regimen:  Melatonin 5mg qhs 62mient reports he is sleeping fairly well at night.  He does have a new 6 month o17d at home and some nights he is able to sleep better that other.  Interventions: none Continue current regimen for sleep  BPH with elevated PSA; Last PSA was elevated at 8.02 on 07/06/20 Dr Copland rLorelei Pontded patient follow up with urologist but patient states today he has not yet.  He  has seen Dr Aubrey EvMarchia Bond Recommended he follow up with Dr Evans ASAAmalia Haileyovider phone number for Dr Evan's ofRebekah [place]  Medication management [facility] Has VA benefits Interventions Comprehensive medication review performed. Medication list updated Reviewed refill history and discussed adherence with patient. Continue current medication management strategy Discussed importance of medication dispensing continuity to reduce risk of error   Patient Goals/Self-Care Activities Over the next 180 days, patient will:  take medications as prescribed, check glucose daily , document, and provide at future appointments, check blood pressure 1 to 3 times per week, document, and provide at future appointments, and collaborate with provider on medication access solutions  Follow Up Plan: Telephone follow up appointment with care management team member scheduled for:  6 months  ; Will have pharmacy team CMA check with patient in 2 weeks about dizziness, BP reading and BG readings.            Medication Assistance:  Patient has VA benefits and Medicare; As long patient med is on VA formulDyesst eligible for any patient assistance programs.   [facility]'s [facility]  [facility] [address] [address] [place] [phone] [phone] [place]01Lewisvil[place]red [facility] [phone]  [id number] - [place], [place] - [address] [place] [address] Phone: [phone] Fax: [phone]  [id number] [place], [place] - [id number] [id number]: [phone]: [phone]  Follow Up:  Patient agrees to Care Plan and Follow-up.  Plan: Telephone follow up appointment with care management team member scheduled for:  2 weeks and Clinical Pharmacist will reach out to the patient again over the next 90 days.  TCherre Robins PharmD Clinical Pharmacist [facility]Gr[facility]ville[facility]

## 2021-04-05 NOTE — Patient Instructions (Signed)
Visit Information  PATIENT GOALS:  Goals Addressed             This Visit's Progress    Chronic Care Management Pharmacy Care Plan   Not on track    [place] (see longitudinal plan of care for additional care plan information)  Current Barriers:  Chronic Disease Management support, education, and care coordination needs related to Hypertension, Hyperlipidemia, Diabetes, Insomnia, GERD, Pain, BPH/ED   Hypertension BP Readings from Last 3 Encounters:  07/06/20 136/84  09/25/19 (!) 176/99  09/25/19 (!) 149/81  Pharmacist Clinical Goal(s): Over the next 90 days, patient will work with PharmD and providers to maintain BP goal <140/90 while minimizing side effects / dizziness and falls. Current regimen:  Losartan HCTZ 100/25mg  - take 1 tablet daily Interventions: Discussed blood pressure goal Discussed diet and exercise Recommended trial of taking 1/2 tablet losartan HCTZ daily for 1 week to see if dizziness improves Recommended patient check blood pressure 1 to 3 times per week, document, and provide at future appointments Recommended he discuss dizziness with [place] physician today Patient self care activities - Over the next 90 days, patient will: Check blood pressure 1 to 3 times per week, document, and provide at future appointments Ensure daily salt intake < 2300 mg/day Discuss dizziness with [place] physician today and consider follow up with neurology to see if there is a cause of dizziness outside of amlodipine Take 0.5 tablets of losartan HCTZ for 1 week to see if dizziness improves; continue to check blood pressure 1 to 3 times per week, document, and provide at future appointments. A medical assistance with the chronic care management team will call you to check dizziness and blood pressure reading in 2 weeks.  Ensure daily salt intake < 2300 mg/day  Hyperlipidemia with elevated triglycerides Lipid Panel     Component Value Date/Time   CHOL 209 (H) 07/06/2020 1448    TRIG 422 (H) 07/06/2020 1448   HDL 34 (L) 07/06/2020 1448   CHOLHDL 6.1 (H) 07/06/2020 1448   VLDL 57.0 (H) 07/09/2017 0826   LDLDIRECT 88.0 05/28/2019 1110   Pharmacist Clinical Goal(s): Over the next 90 days, patient will work with PharmD and providers to achieve LDL goal < 100 and Triglycerides <150 Current regimen:  Aspirin 81mg  daily Niaspan CR 1000mg  at bedtime (lowers triglycerides and can increase HDL) Salmon Oil 1000mg  twice daily [facility] (lowers triglycerides and can increase HDL) Praluent 75mg  every 14 days (lowers LDL mostly be also can lower triglycerides) Interventions: Discussed lipid goals and risk associated with elevated TRIG Reviewed foods that can increase triglycerides; continue to limit intake of alcohol, sweets / sugar, potatoes, bread, rice and pasta Patient self care activities - Over the next 90 days, patient will: Maintain cholesterol medication regimen.  Limit intake of foods that can increase triglycerides Due to have labs rechecked - call for follow up with Dr Lorelei Pont  Diabetes Lab Results  Component Value Date/Time   HGBA1C 7.8 (H) 07/06/2020 02:48 PM   HGBA1C 7.3 (H) 05/28/2019 11:10 AM   HGBA1C 8.2 08/12/2018 12:00 AM  A1c at Dr Theodosia Blender office was 10.6% on 02/10/2021  Pharmacist Clinical Goal(s): Over the next 90 days, patient will work with PharmD and providers to achieve A1c goal <7% Current regimen:  Ozempic - injection 1mg  weekly Metformin XR 500mg  - take 2 tablets = 1000mg  twice a day with morning and evening meals Interventions: Discussed A1c and blood glucose goals Reviewed home blood glucose readings and reviewed goals  Fasting blood glucose goal (before meals) = 80 to 130 Blood glucose goal after a meal = less than 180  Discussed medication options - might be able to increase Ozempic to 2mg  weekly Discussed cost assistance options but since patient has [facility] benefits he does not qualify for patient assistance programs unless medication is  not on [place] Patient self care activities - Over the next 90 days, patient will: Check blood sugar once daily, document, and provide at future appointments Limit intake of high carbohyrates foods and sugar - continue with low carbohydrate diet Continue to increase physical activity - goal is to work up to at least 150 minutes per week.  Contact provider with any episodes of hypoglycemia or BP over 250.  Discuss with [place] physician today at appointment if increase to Ozempic 2mg  weekly is possible or if want retrial of Rybelsus is also an option as it looks like Rybelsus is on [place] /  elevated PSA; PSA was elevated at 8.02 on 07/06/20; Last checked 02/20/2021 by [facility] and was 10.0 Interventions: Follow up with urology clinical at [facility] Patient self care activities - Over the next 30 days, patient will: Continue to follow up with [facility] - looks like next appointment is 04/06/2021  Osteoarthritis Pharmacist Clinical Goal(s) Over the next 90 days, patient will work with PharmD and providers to reduce symptoms associated with arthritis Current regimen:  Alpha lipoic acid 600mg  daily  Diclofenac 75mg  up to twice a day if needed for pain Patient self care activities - Over the next 90 days, patient will: Maintain medication regimen for arthritis    GERD Pharmacist Clinical Goal(s) Over the next 90 days, patient will work with PharmD and providers to reduce symptoms associated with GERD Current regimen:  Famotidine 20mg  twice daily Omeprazole 40mg  daily at bedtime Patient self care activities - Over the next 90 days, patient will: Take omeprazole in morning on empty stomach and take famotidine at bedtime  Medication management Pharmacist Clinical Goal(s): Over the next 90 days, patient will work with PharmD and providers to maintain optimal medication adherence Current pharmacy[facility] ([facility] and [facility] Interventions Comprehensive  medication review performed. Continue current medication management strategy Discussed importance of medication dispensing continuity to reduce risk of error Patient self care activities - Over the next 90 days, patient will: Focus on medication adherence by filling and taking medications appropriately  Take medications as prescribed Report any questions or concerns to PharmD and/or provider(s)   Patient Goals/Self-Care Activities Over the next 180 days, patient will:  take medications as prescribed check glucose daily , document, and provide at future appointments,  check blood pressure 1 to 3 times per week, document, and provide at future appointments collaborate with providers for follow up appointments with primary care physician - Dr Lorelei Pont and urologist - Dr Amalia Hailey  Follow Up Plan: Telephone follow up appointment with care management team member scheduled for:  6 months ; Will have pharmacy team CMA check with patient in 2 weeks about dizziness, BP reading and BG readings.   Please see past updates related to this goal by clicking on the "Past Updates" button in the selected goal          Patient verbalizes understanding of instructions provided today and agrees to view in [place].   Telephone follow up appointment with care management team member scheduled for:  Cherre Robins, PharmD Clinical Pharmacist [place] [phone]

## 2021-04-19 DIAGNOSIS — H3581 Retinal edema: Secondary | ICD-10-CM | POA: Diagnosis not present

## 2021-04-19 DIAGNOSIS — H34832 Tributary (branch) retinal vein occlusion, left eye, with macular edema: Secondary | ICD-10-CM | POA: Diagnosis not present

## 2021-04-19 DIAGNOSIS — Z7984 Long term (current) use of oral hypoglycemic drugs: Secondary | ICD-10-CM | POA: Diagnosis not present

## 2021-04-19 DIAGNOSIS — H52223 Regular astigmatism, bilateral: Secondary | ICD-10-CM | POA: Diagnosis not present

## 2021-04-19 DIAGNOSIS — E113292 Type 2 diabetes mellitus with mild nonproliferative diabetic retinopathy without macular edema, left eye: Secondary | ICD-10-CM | POA: Diagnosis not present

## 2021-04-19 DIAGNOSIS — H2513 Age-related nuclear cataract, bilateral: Secondary | ICD-10-CM | POA: Diagnosis not present

## 2021-04-19 DIAGNOSIS — H5213 Myopia, bilateral: Secondary | ICD-10-CM | POA: Diagnosis not present

## 2021-04-23 NOTE — Progress Notes (Addendum)
[facility] [address], [place], [place] [address] [id number]  Date:  04/26/2021   Name:  Timothy Chapman   DOB:  1946/03/21   MRN:  [id number]  PCP:  Timothy Mclean, MD    Chief Complaint: Dizziness (Comes and goes, worse in afternoon, several months)   History of Present Illness:  Timothy Chapman is a 75 y.o. very pleasant male patient who presents with the following:  History of diabetes, hypertension, chronic back pain, severe illness in 2019-he became ill with sepsis due to cholangitis, had acute respiratory failure and required intubation He receives some care through the [place]  His wife Timothy Chapman (who is significantly younger) gave birth to their first child together- a son Timothy Chapman who is now 74 months old (he does have an older daughter from a previous marriage)   Patient is seen today with concern of vertigo/dizziness Most recent visit with myself was in October He notes he was having lightheadedness back when he was on amlodipine- This was changed over to losartan earlier this year (March) and he does note improvement but symptoms are not entirely resolved He reports that his symptoms are not apparently orthostatic-not worse after standing from a seated position  He notes that he may "lose my balance" at times  He has fallen a few times-most recent major fall was last November, he hit his head and EMS had to evaluate him His last fall was 3-4 weeks ago  No memory concerns He does have some numbness of his feet- neuropathy- which does not help with his balance He is also getting foot pain  He used gabapentin in the past for this issue but it did not help much   [facility]- he is seeing urology on 9/12  He is seeing ophthalmology through [facility], Dr. 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 He is getting eye injections and seems to be doing ok He feels  like his vision is ok   He had COVID-19 in July; he feels still a bit tired out He has a treadmill to get more exercise- no CP but he still feels a bit congested and may cough since he had COVID  He is on ozempic right now- he was supposed to be on 2 mg but had excessive SE so he went back to 1 mg  He has been on ozempic for about a year  His A1c at the [place] was 10.4%- this was done earlier this year, I cannot see this result  Lab Results  Component Value Date   HGBA1C 7.8 (H) 07/06/2020   He is on Praluent for lipids-we might want to recheck his lipids today as well as A1c He ran out last month.  This medication is somewhat pricey, we will check his lipids and judge effect prior to refilling  He reports discussing his falls and balance issues with neurology through the [place].  They apparently thought amlodipine could be causing his falls and change this to losartan.  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   Paroxysmal atrial fibrillation ([place]) 12/16/2017   SBO (small bowel obstruction) ([place]) 10/08/2017   ATN (acute tubular necrosis) (  [place]) 10/08/2017   Septic shock ([place]) 10/04/2017   Acute cholecystitis s/p perc cholecystomy drainage 10/04/2017    Acute respiratory failure with hypoxia (HCC)    Chronic back pain 02/08/2016   Controlled type 2 diabetes mellitus with diabetic neuropathy, without long-term current use of insulin ([place]) 02/08/2016   Insomnia 02/08/2016   History of diverticulitis 02/08/2016   HYPERTENSION, BENIGN ESSENTIAL, UNCONTROLLED 03/12/2009   ABNORMAL ELECTROCARDIOGRAM 03/12/2009    Past Medical History:  Diagnosis Date   AKI (acute kidney injury) ([place])    Anxiety    Arthritis    [place] esophagus    Depression    Diabetes mellitus without  complication ([place])    type 2   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;  Service: General;  Laterality: N/A;   COLONOSCOPY     ESOPHAGOGASTRODUODENOSCOPY     IR RADIOLOGIST EVAL & MGMT  10/30/2017   IR RADIOLOGIST EVAL & MGMT  11/14/2017   PROSTATE SURGERY  2017   uro lift per pt.   REPLACEMENT TOTAL KNEE Left    ROTATOR CUFF REPAIR     Bil    Social History   Tobacco Use   Smoking status: Former    Types: Cigarettes    Quit date: 09/18/1975    Years since quitting: 45.6   Smokeless tobacco: Never  Vaping Use   Vaping Use: Never used  Substance Use Topics   Alcohol use: Not Currently    Alcohol/week: 0.0 standard drinks    Comment: Rarely   Drug use: Never    Family History  Problem Relation Age of Onset   Heart failure Father    Heart disease Father    Emphysema Mother    Colon cancer Maternal Grandmother     Allergies  Allergen Reactions   Codeine     hallucinations   Farxiga [Dapagliflozin] Other (See Comments)    Genital infection and irritation   Statins     Has tried several cannot tolerate    Medication list has been reviewed and updated.  Current Outpatient Medications on File Prior to Visit  Medication Sig Dispense Refill   Acetylcarnitine HCl 500 MG CAPS Take 500 mg by mouth daily.     Alcohol Swabs (B-D SINGLE USE SWABS REGULAR) PADS USE PRIOR TO INSULIN DOSE 200 each 7   Alpha-Lipoic Acid 600 MG CAPS Take 600 mg by mouth  daily.     aspirin 81 MG chewable tablet Chew 1 tablet (81 mg total) by mouth daily. 30 tablet 0   diclofenac (VOLTAREN) 75 MG EC tablet TAKE 1 TABLET TWICE A DAY  AS NEEDED FOR JOINT PAINS (Patient taking differently: TAKE 1 TABLET TWICE A DAY  AS NEEDED FOR JOINT PAINS) 180 tablet 2   famotidine (PEPCID) 20 MG tablet Take 1 tablet (20 mg total) by mouth 2 (two) times daily. 180 tablet 3   Lancets (ONETOUCH DELICA PLUS Q000111Q) MISC USE  TWO TIMES A DAY TO     CHECK BLOOD SUGAR 100 each 3   losartan-hydrochlorothiazide (HYZAAR) 100-25 MG tablet Take 1 tablet by mouth daily.     melatonin 5 MG TABS Take 5 mg by mouth at bedtime.      metFORMIN (GLUCOPHAGE-XR) 500 MG 24 hr tablet Take 2 tablets (1,000 mg total) by mouth 2 (two) times daily after a meal. Take with food 360 tablet 3   Multiple Vitamin (MULTIVITAMIN) tablet Take 1 tablet by mouth daily.     niacin (NIASPAN) 1000 MG CR tablet Take 1,000 mg by mouth at bedtime.     omeprazole (PRILOSEC) 40 MG capsule TAKE 1 CAPSULE DAILY 90 capsule 1   ONETOUCH VERIO test strip USE AS INSTRUCTED 100 strip 7   tadalafil (CIALIS) 10 MG tablet Take 1 tablet (10 mg total) by mouth daily as needed for erectile dysfunction. Max 20 mg total per day 90 tablet 2   tadalafil (CIALIS) 5 MG tablet Take 1 tablet (5 mg total) by mouth daily. 90 tablet 2   vitamin C (ASCORBIC ACID) 500 MG tablet Take 500 mg by mouth daily.     PRALUENT 75 MG/ML SOAJ INJECT '75MG'$  INTO THE SKIN EVERY 14 DAYS (Patient not taking: Reported on 04/26/2021) 2 mL 0   No current facility-administered medications on file prior to visit.    Review of Systems:  As per HPI- otherwise negative.   Physical Examination: Vitals:   04/26/21 1116  BP: 124/80  Pulse: 87  Resp: 18  Temp: (!) 97.4 F (36.3 C)  SpO2: 97%   Vitals:   04/26/21 1116  Weight: 236 lb (107 kg)  Height: '5\' 9"'$  (1.753 m)   Body mass index is 34.85 kg/m. Ideal Body Weight: Weight in (lb) to have BMI = 25:  168.9  GEN: no acute distress.  Obese, looks well and his normal self HEENT: Atraumatic, Normocephalic.  Ears and Nose: No external deformity. CV: RRR, No M/G/R. No JVD. No thrill. No extra heart sounds. PULM: CTA B, no wheezes, crackles, rhonchi. No retractions. No resp. distress. No accessory muscle use. ABD: S, NT, ND, +BS. No rebound. No HSM. EXTR: No c/c/e PSYCH: Normally interactive. Conversant.  Foot exam- normal pulses bilaterally but he misses monofilament on the left only, cannot sense 2/3 of left foot sites   Assessment and Plan: HYPERTENSION, BENIGN ESSENTIAL, UNCONTROLLED - Plan: CBC, Comprehensive metabolic panel  Controlled type 2 diabetes mellitus with diabetic neuropathy, without long-term current use of insulin ([place]) - Plan: Hemoglobin A1c  Dyslipidemia - Plan: Lipid panel  Polyneuropathy associated with underlying disease ([place]) - Plan: B12 and Folate Panel  Fall, initial encounter - Plan: Ambulatory referral to Neurology  Erectile dysfunction, unspecified erectile dysfunction type - Plan: tadalafil (CIALIS) 10 MG tablet, tadalafil (CIALIS) 5 MG tablet  Benign prostatic hyperplasia with lower urinary tract symptoms, symptom details unspecified - Plan: tadalafil (CIALIS) 5 MG tablet  Patient seen today with concern of balance difficulty and falls.  He will feel lightheaded, denies vertigo sensation.  However, his symptoms are not orthostatic and his blood pressures are not running low; blood pressure today is ideal He does also have peripheral neuropathy likely due to diabetes He is concerned as he now has an infant to care for, he does not want to fall or otherwise potentially endanger his son.  I will refer him to community neurology for evaluation He will let me know if any is getting worse in the meantime  He takes Cialis 5 mg daily, will add an additional 10 mg as needed for intercourse.  I have refilled both of these today Will plan further follow- up pending  labs.   Will perhaps refill praleunt depending on his lipid results   This visit occurred during the SARS-CoV-2 public health emergency.  Safety protocols were in place, including screening questions prior to the visit, additional usage of staff PPE, and extensive cleaning of exam room while observing appropriate contact time as indicated for disinfecting solutions.   Signed Lamar Blinks, MD  Received labs 8/11- message to pt  Results for orders placed or performed in visit on 04/26/21  CBC  Result Value Ref Range   WBC 6.9 4.0 - 10.5 K/uL   RBC 4.15 (L) 4.22 - 5.81 Mil/uL   Platelets 232.0 150.0 - 400.0 K/uL   Hemoglobin 12.1 (L) 13.0 - 17.0 g/dL   HCT 36.5 (L) 39.0 - 52.0 %   MCV 88.1 78.0 - 100.0 fl   MCHC 33.1 30.0 - 36.0 g/dL   RDW 14.9 11.5 - 15.5 %  Comprehensive metabolic panel  Result Value Ref Range   Sodium 134 (L) 135 - 145 mEq/L   Potassium 4.6 3.5 - 5.1 mEq/L   Chloride 100 96 - 112 mEq/L   CO2 21 19 - 32 mEq/L   Glucose, Bld 158 (H) 70 - 99 mg/dL   BUN 32 (H) 6 - 23 mg/dL   Creatinine, Ser 1.44 0.40 - 1.50 mg/dL   Total Bilirubin 0.4 0.2 - 1.2 mg/dL   Alkaline Phosphatase 48 39 - 117 U/L   AST 32 0 - 37 U/L   ALT 33 0 - 53 U/L   Total Protein 7.5 6.0 - 8.3 g/dL   Albumin 4.2 3.5 - 5.2 g/dL   GFR 48.46 (L) >60.00 mL/min   Calcium 9.5 8.4 - 10.5 mg/dL  Hemoglobin A1c  Result Value Ref Range   Hgb A1c MFr Bld 9.7 (H) 4.6 - 6.5 %  Lipid panel  Result Value Ref Range   Cholesterol 145 0 - 200 mg/dL   Triglycerides (H) 0.0 - 149.0 mg/dL    815.0 Triglyceride is over 400; calculations on Lipids are invalid.   HDL 25.00 (L) >39.00 mg/dL   Total CHOL/HDL Ratio 6   B12 and Folate Panel  Result Value Ref Range   Vitamin B-12 >1550 (H) 211 - 911 pg/mL   Folate >24.4 >5.9 ng/mL  LDL cholesterol, direct  Result Value Ref Range   Direct LDL 34.0 mg/dL

## 2021-04-26 ENCOUNTER — Ambulatory Visit (INDEPENDENT_AMBULATORY_CARE_PROVIDER_SITE_OTHER): Payer: Medicare HMO | Admitting: Family Medicine

## 2021-04-26 ENCOUNTER — Other Ambulatory Visit: Payer: Self-pay

## 2021-04-26 VITALS — BP 124/80 | HR 87 | Temp 97.4°F | Resp 18 | Ht 69.0 in | Wt 236.0 lb

## 2021-04-26 DIAGNOSIS — N529 Male erectile dysfunction, unspecified: Secondary | ICD-10-CM

## 2021-04-26 DIAGNOSIS — E1140 Type 2 diabetes mellitus with diabetic neuropathy, unspecified: Secondary | ICD-10-CM

## 2021-04-26 DIAGNOSIS — N401 Enlarged prostate with lower urinary tract symptoms: Secondary | ICD-10-CM

## 2021-04-26 DIAGNOSIS — I10 Essential (primary) hypertension: Secondary | ICD-10-CM

## 2021-04-26 DIAGNOSIS — G63 Polyneuropathy in diseases classified elsewhere: Secondary | ICD-10-CM | POA: Diagnosis not present

## 2021-04-26 DIAGNOSIS — W19XXXA Unspecified fall, initial encounter: Secondary | ICD-10-CM | POA: Diagnosis not present

## 2021-04-26 DIAGNOSIS — E785 Hyperlipidemia, unspecified: Secondary | ICD-10-CM

## 2021-04-26 LAB — COMPREHENSIVE METABOLIC PANEL
ALT: 33 U/L (ref 0–53)
AST: 32 U/L (ref 0–37)
Albumin: 4.2 g/dL (ref 3.5–5.2)
Alkaline Phosphatase: 48 U/L (ref 39–117)
BUN: 32 mg/dL — ABNORMAL HIGH (ref 6–23)
CO2: 21 mEq/L (ref 19–32)
Calcium: 9.5 mg/dL (ref 8.4–10.5)
Chloride: 100 mEq/L (ref 96–112)
Creatinine, Ser: 1.44 mg/dL (ref 0.40–1.50)
GFR: 48.46 mL/min — ABNORMAL LOW (ref >60.00)
Glucose, Bld: 158 mg/dL — ABNORMAL HIGH (ref 70–99)
Potassium: 4.6 mEq/L (ref 3.5–5.1)
Sodium: 134 mEq/L — ABNORMAL LOW (ref 135–145)
Total Bilirubin: 0.4 mg/dL (ref 0.2–1.2)
Total Protein: 7.5 g/dL (ref 6.0–8.3)

## 2021-04-26 LAB — LIPID PANEL
Cholesterol: 145 mg/dL (ref 0–200)
HDL: 25 mg/dL — ABNORMAL LOW (ref >39.00)
Total CHOL/HDL Ratio: 6
Triglycerides: 815 mg/dL — ABNORMAL HIGH (ref 0.0–149.0)

## 2021-04-26 LAB — CBC
HCT: 36.5 % — ABNORMAL LOW (ref 39.0–52.0)
Hemoglobin: 12.1 g/dL — ABNORMAL LOW (ref 13.0–17.0)
MCHC: 33.1 g/dL (ref 30.0–36.0)
MCV: 88.1 fl (ref 78.0–100.0)
Platelets: 232 10*3/uL (ref 150.0–400.0)
RBC: 4.15 Mil/uL — ABNORMAL LOW (ref 4.22–5.81)
RDW: 14.9 % (ref 11.5–15.5)
WBC: 6.9 10*3/uL (ref 4.0–10.5)

## 2021-04-26 LAB — LDL CHOLESTEROL, DIRECT: Direct LDL: 34 mg/dL

## 2021-04-26 LAB — B12 AND FOLATE PANEL
Folate: >24.4 ng/mL (ref >5.9)
Vitamin B-12: >1550 pg/mL — ABNORMAL HIGH (ref 211–911)

## 2021-04-26 LAB — HEMOGLOBIN A1C: Hgb A1c MFr Bld: 9.7 % — ABNORMAL HIGH (ref 4.6–6.5)

## 2021-04-26 MED ORDER — TADALAFIL 5 MG PO TABS: 5.0000 mg | ORAL_TABLET | Freq: Every day | ORAL | 2 refills | Status: DC

## 2021-04-26 MED ORDER — TADALAFIL 10 MG PO TABS: 10.0000 mg | ORAL_TABLET | Freq: Every day | ORAL | 2 refills | Status: DC | PRN

## 2021-04-26 NOTE — Patient Instructions (Signed)
Good to see you today- thanks for letting us meet precious Timothy Chapman! I will set you up with neurology to discuss your falls Let me know if this is getting worse I will be in touch with your labs asap

## 2021-04-27 ENCOUNTER — Encounter: Payer: Self-pay | Admitting: Family Medicine

## 2021-04-27 MED ORDER — PRALUENT 75 MG/ML ~~LOC~~ SOAJ: SUBCUTANEOUS | 11 refills | Status: DC

## 2021-04-27 NOTE — Addendum Note (Signed)
Addended by: Lamar Blinks C on: 04/27/2021 05:55 AM   Modules accepted: Orders

## 2021-05-08 ENCOUNTER — Telehealth: Payer: Self-pay

## 2021-05-08 NOTE — Progress Notes (Signed)
Chronic Care Management Pharmacy Assistant   Name: TRAUIS GATCHELL  MRN: [id number] DOB: 09-16-1946   Reason for Encounter: Disease State Hypertension    Recent office visits:  04/26/21 Lamar Blinks MD - Vertigo/dizziness - labs ordered - referred to community neurology for evaluation - follow up scheduled for 3 weeks.   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    Alcohol Swabs (B-D SINGLE USE SWABS REGULAR) PADS USE PRIOR TO INSULIN DOSE    Alirocumab (PRALUENT) 75 MG/ML SOAJ INJECT '75MG'$  INTO THE SKIN EVERY 14 DAYS    Alpha-Lipoic Acid 600 MG CAPS Take 600 mg by mouth daily.    aspirin 81 MG chewable tablet Chew 1 tablet (81 mg total) by mouth daily.    diclofenac (VOLTAREN) 75 MG EC tablet TAKE 1 TABLET TWICE A DAY  AS NEEDED FOR JOINT PAINS (Patient taking differently: TAKE 1 TABLET TWICE A DAY  AS NEEDED FOR JOINT PAINS) 09/29/2020: Recommended not to take by [facility]   famotidine (PEPCID) 20 MG tablet Take 1 tablet (20 mg total) by mouth 2 (two) times daily. 04/04/2021: Taking as needed   Lancets (ONETOUCH DELICA PLUS Q000111Q) MISC USE TWO TIMES A DAY TO     CHECK BLOOD SUGAR    losartan-hydrochlorothiazide (HYZAAR) 100-25 MG tablet Take 1 tablet by mouth daily.    melatonin 5 MG TABS Take 5 mg by mouth at bedtime.     metFORMIN (GLUCOPHAGE-XR) 500 MG 24 hr tablet Take 2 tablets (1,000 mg total) by mouth 2 (two) times daily after a meal. Take with food    Multiple Vitamin (MULTIVITAMIN) tablet Take 1 tablet by mouth daily.    niacin (NIASPAN) 1000 MG CR tablet Take 1,000 mg by mouth at bedtime.    omeprazole (PRILOSEC) 40 MG capsule TAKE 1 CAPSULE DAILY    ONETOUCH VERIO test  strip USE AS INSTRUCTED    tadalafil (CIALIS) 10 MG tablet Take 1 tablet (10 mg total) by mouth daily as needed for erectile dysfunction. Max 20 mg total per day    tadalafil (CIALIS) 5 MG tablet Take 1 tablet (5 mg total) by mouth daily.    vitamin C (ASCORBIC ACID) 500 MG tablet Take 500 mg by mouth daily.    No facility-administered encounter medications on file as of 05/08/2021.   Reviewed chart prior to disease state call. Spoke with patient regarding BP  Recent Office Vitals: BP Readings from Last 3 Encounters:  04/26/21 124/80  07/06/20 136/84  09/25/19 (!) 176/99   Pulse Readings from Last 3 Encounters:  04/26/21 87  07/06/20 86  09/25/19 74    Wt Readings from Last 3 Encounters:  04/26/21 236 lb (107 kg)  02/22/21 245 lb (111.1 kg)  07/06/20 253 lb (114.8 kg)     Kidney Function Lab Results  Component Value Date/Time   CREATININE 1.44 04/26/2021 12:01 PM   CREATININE 1.20 (H) 07/06/2020 02:48 PM   CREATININE 1.22 05/28/2019 11:10 AM   CREATININE 1.21 11/08/2017 01:33 PM   GFR 48.46 (L) 04/26/2021 12:01 PM   GFRNONAA 59 (L) 12/12/2017 01:16 PM   GFRAA >60 12/12/2017 01:16 PM  BMP Latest Ref Rng & Units 04/26/2021 07/06/2020 05/28/2019  Glucose 70 - 99 mg/dL 158(H) 108(H) 116(H)  BUN 6 - 23 mg/dL 32(H) 24 31(H)  Creatinine 0.40 - 1.50 mg/dL 1.44 1.20(H) 1.22  BUN/Creat Ratio 6 - 22 (calc) - 20 -  Sodium 135 - 145 mEq/L 134(L) 136 137  Potassium 3.5 - 5.1 mEq/L 4.6 4.6 4.9  Chloride 96 - 112 mEq/L 100 101 105  CO2 19 - 32 mEq/L '21 21 24  '$ Calcium 8.4 - 10.5 mg/dL 9.5 10.4(H) 9.4    Current antihypertensive regimen:  Losartan HCTZ 100/'25mg'$  - take 1 tablet daily How often are you checking your Blood Pressure? 1-2x per week  Current home BP readings: Last reading was on 05/06/21 at 136/82.   What recent interventions/DTPs have been made by any provider to improve Blood Pressure control since last CPP Visit: Patient states that he has been fasting, only eating  one meal per day, which he states has helped his blood sugars. Patient states that he has recently also been eating low carb.   Any recent hospitalizations or ED visits since last visit with CPP? No What diet changes have been made to improve Blood Pressure Control?  N/a What exercise is being done to improve your Blood Pressure Control?  N/a  Adherence Review: Is the patient currently on ACE/ARB medication? No Does the patient have >5 day gap between last estimated fill dates? No   Misc. Comment: Mr Timothy Chapman a very pleasant man states that he has been doing okay. Patient states that since changing his medication to Losartan he has not been feeling as much dizziness, Patient does state that he still has some episodes but not as much. Patient does not remember any other reading but does recall the most recent one on 05/06/21 which was 136/82. Patient mentioned that since he started his fasting diet, patient is concerned with low potassium, but will keep his PCP updated on this.   Star Rating Drugs:    Andee Poles, CMA

## 2021-05-10 ENCOUNTER — Ambulatory Visit (INDEPENDENT_AMBULATORY_CARE_PROVIDER_SITE_OTHER): Payer: Medicare HMO | Admitting: Pharmacist

## 2021-05-10 DIAGNOSIS — E1140 Type 2 diabetes mellitus with diabetic neuropathy, unspecified: Secondary | ICD-10-CM

## 2021-05-10 DIAGNOSIS — E785 Hyperlipidemia, unspecified: Secondary | ICD-10-CM

## 2021-05-10 DIAGNOSIS — I10 Essential (primary) hypertension: Secondary | ICD-10-CM

## 2021-05-14 NOTE — Chronic Care Management (AMB) (Signed)
Chronic Care Management Pharmacy Note  05/14/2021 Name:  Timothy Chapman MRN:  [id number] DOB:  [birth year]  Summary: A1c improving but still above goal. He tried higher dose of Ozempic 34m weeky but was unable to tolerate - decreased back to 152mweekly.  Following low CHO diet with weight loss In Medicare coverage gap - cannot afford Praluent - as not taken in at least 1 month.   Recommendations/Changes made from today's visit: Continue low CHO diet and weight loss.  Reviewed VA coverage for Praluent - requires prior authorization form VA provider. Patient to discuss with [facility] care provider. I also send application for patient assistance for Praluent  Subjective: Timothy SHEEHANs an [age].o. year old male who is a primary patient of Copland, JeGay FillerMD.  The CCM team was consulted for assistance with disease management and care coordination needs.    Engaged with patient by telephone for follow up visit in response to provider referral for pharmacy case management and/or care coordination services.   Consent to Services:  The patient was given information about Chronic Care Management services, agreed to services, and gave verbal consent prior to initiation of services.  Please see initial visit note for detailed documentation.   Patient Care Team: Copland, JeGay FillerMD as PCP - General (Family Medicine) EcCherre RobinsPharmD (Pharmacist)  Recent office visits: 04/26/21 JeLamar BlinksD - Vertigo/dizziness - labs ordered - referred to community neurology for evaluation. Patient advised after labs returned to restart Praluent and decreaes B12 supplement to every OTHER day 03/29/2021 - phone call - reported metallic taste with Paxlovid; recommended he stop 03/28/2021 - Dr WeJoyce Gross COVID positive on 03/24/2021. Prescribed Paxlovid 30069mid for 5 days and benzonatate 200m27m to tid prn for 14 days.  03/02/2021 - Dr WeslJoyce Grossee for Type 2 DM and HTN; no med  changes.  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 Good response to Eylea. Received #13 OVE of left eyee at this visit. F/U in 8 weeks for reevluation and IVE. 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 Stressed better BG control with goal of A1c < 7.0. Monitor cataract of both eyes.  02[facility]/2021 [facility] - Elevated PSA / malignant neoplasm of prostate. No med changes - has follow up 04/06/2021 02/14/2021 - Ophthalmology  - Dr Shah fManuella Ghaziiabetic eye exam and [facility]: None in previous 6 months  Objective:  Lab Results  Component Value Date   CREATININE 1.44 04/26/2021   CREATININE 1.20 (H) 07/06/2020   CREATININE 1.22 05/28/2019    Lab Results  Component Value Date   HGBA1C 9.7 (H) 04/26/2021   Last diabetic Eye exam: No results found for: HMDIABEYEEXA  Last diabetic Foot exam: No results found for: HMDIABFOOTEX      Component Value Date/Time   CHOL 145 04/26/2021 1201   TRIG (H) 04/26/2021 1201    815.0 Triglyceride is over 400; calculations on Lipids are invalid.   HDL 25.00 (L) 04/26/2021 1201   CHOLHDL 6 04/26/2021 1201   VLDL 57.0 (H) 07/09/2017 0826   LDLCALC  07/06/2020 1448     Comment:     .  LDL cholesterol not calculated. Triglyceride levels greater than 400 mg/dL invalidate calculated LDL results. . Reference range: <100 . Desirable range <100 mg/dL for primary prevention;   <70 mg/dL for patients with CHD or diabetic patients  with > or = 2 CHD risk factors. Marland Kitchen LDL-C is now calculated using the Martin-Hopkins  calculation, which is a validated novel method providing  better accuracy than the Friedewald equation in the   estimation of LDL-C.  Cresenciano Genre et al. Annamaria Helling. 3254;982(64): 2061-2068  (http://education.QuestDiagnostics.com/faq/FAQ164)    LDLDIRECT 34.0 04/26/2021 1201    Hepatic Function Latest Ref Rng & Units 04/26/2021 07/06/2020 05/28/2019  Total Protein 6.0 - 8.3 g/dL 7.5 7.8 7.5  Albumin 3.5 - 5.2 g/dL 4.2 - 4.4  AST 0 - 37 U/L 32 35 20  ALT 0 - 53 U/L 33 70(H) 27  Alk Phosphatase 39 - 117 U/L 48 - 40  Total Bilirubin 0.2 - 1.2 mg/dL 0.4 0.4 0.3  Bilirubin, Direct 0.1 - 0.5 mg/dL - - -    No results found for: TSH, FREET4  CBC Latest Ref Rng & Units 04/26/2021 07/06/2020 05/28/2019  WBC 4.0 - 10.5 K/uL 6.9 8.9 7.2  Hemoglobin 13.0 - 17.0 g/dL 12.1(L) 13.3 12.2(L)  Hematocrit 39.0 - 52.0 % 36.5(L) 40.7 37.5(L)  Platelets 150.0 - 400.0 K/uL 232.0 247 276.0    No results found for: VD25OH  Clinical ASCVD: Yes  The 10-year ASCVD risk score Mikey Bussing DC Jr., et al., 2013) is: 48.9%   Values used to calculate the score:     Age: 56 years     Sex: Male     Is Non-Hispanic African American: No     Diabetic: Yes     Tobacco smoker: Yes     Systolic Blood Pressure: 158 mmHg     Is BP treated: Yes     HDL Cholesterol: 25 mg/dL     Total Cholesterol: 145 mg/dL    Social History   Tobacco Use  Smoking Status Former   Types: Cigarettes   Quit date: 09/18/1975   Years since quitting: 45.6  Smokeless Tobacco Never   BP Readings from Last 3 Encounters:  04/26/21 124/80  07/06/20 136/84  09/25/19 (!) 176/99   Pulse Readings from Last 3 Encounters:  04/26/21 87  07/06/20 86  09/25/19 74   Wt Readings from Last 3 Encounters:  04/26/21 236 lb (107 kg)  02/22/21 245 lb (111.1 kg)  07/06/20 253 lb (114.8 kg)    Assessment: Review of patient past medical history, allergies, medications, health status, including review of consultants reports, laboratory and other test data, was performed as part of comprehensive evaluation and provision of chronic care management services.   SDOH:   (Social Determinants of Health) assessments and interventions performed:     CCM Care Plan  Allergies  Allergen Reactions   Codeine     hallucinations   Farxiga [Dapagliflozin] Other (See Comments)    Genital infection and irritation   Statins     Has tried several cannot tolerate    Medications Reviewed Today     Reviewed by Cherre Robins, PharmD (Pharmacist) on 05/10/21 at 1106  Med List Status: <None>   Medication Order Taking? Sig Documenting Provider Last Dose Status Informant  Acetylcarnitine HCl 500 MG CAPS [id number] Yes Take 500 mg by mouth daily. [provider] Taking Active Self           Med Note Liliane Channel Nov 21, 2017 12:03  PM)    Alcohol Swabs (B-D SINGLE USE SWABS REGULAR) PADS [id number] Yes USE PRIOR TO INSULIN DOSE Copland, Gay Filler, MD Taking Active   Alirocumab (PRALUENT) 75 MG/ML SOAJ [id number] No INJECT 75MG INTO THE SKIN EVERY 14 DAYS  Patient not taking: Reported on 05/10/2021   Copland, Gay Filler, MD Not Taking Active   Alpha-Lipoic Acid 600 MG CAPS [id number] Yes Take 600 mg by mouth daily. [provider] Taking Active Self           Med Note Antony Contras, Abigale Dorow B   Tue Apr 04, 2021  1:16 PM)    aspirin 81 MG chewable tablet [id number] Yes Chew 1 tablet (81 mg total) by mouth daily. Shelly Coss, MD Taking Active Self  diclofenac (VOLTAREN) 75 MG EC tablet [id number] Yes TAKE 1 TABLET TWICE A DAY  AS NEEDED FOR JOINT PAINS  Patient taking differently: TAKE 1 TABLET TWICE A DAY  AS NEEDED FOR JOINT PAINS   Copland, Gay Filler, MD Taking Active            Med Note Lynita Lombard Sep 29, 2020  3:10 PM) Recommended not to take by [facility]  famotidine (PEPCID) 20 MG tablet [id number] Yes Take 1 tablet (20 mg total) by mouth 2 (two) times daily. Copland, Gay Filler, MD Taking Active            Med Note Theodosia Blender Apr 04, 2021  1:18 PM) Taking as needed  Lancets (ONETOUCH DELICA PLUS [id number]) [place] [id number] Yes USE TWO  TIMES A DAY TO     CHECK BLOOD SUGAR Copland, Gay Filler, MD Taking Active   losartan-hydrochlorothiazide (HYZAAR) 100-25 MG tablet [id number] Yes Take 1 tablet by mouth daily. [provider] Taking Active   melatonin 5 MG TABS [id number] Yes Take 5 mg by mouth at bedtime.  [provider] Taking Active Self  metFORMIN (GLUCOPHAGE-XR) 500 MG 24 hr tablet [id number] Yes Take 2 tablets (1,000 mg total) by mouth 2 (two) times daily after a meal. Take with food Copland, Gay Filler, MD Taking Active   Multiple Vitamin (MULTIVITAMIN) tablet [id number] Yes Take 1 tablet by mouth daily. [provider] Taking Active Self  niacin (NIASPAN) 1000 MG CR tablet [id number] Yes Take 1,000 mg by mouth at bedtime. [provider] Taking Active   omeprazole (PRILOSEC) 40 MG capsule [id number] Yes TAKE 1 CAPSULE DAILY Copland, Gay Filler, MD Taking Active   [facility] VERIO test strip [id number] Yes USE AS INSTRUCTED Copland, Gay Filler, MD Taking Active   tadalafil (CIALIS) 10 MG tablet [id number] Yes Take 1 tablet (10 mg total) by mouth daily as needed for erectile dysfunction. Max 20 mg total per day Copland, Gay Filler, MD Taking Active   tadalafil (CIALIS) 5 MG tablet [id number] Yes Take 1 tablet (5 mg total) by mouth daily. Copland, Gay Filler, MD Taking Active   vitamin C (ASCORBIC ACID) 500 MG tablet [id number] Yes Take 500 mg by mouth daily. 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   Paroxysmal atrial fibrillation ([place]) 12/16/2017   SBO (small bowel obstruction) ([place]) 10/08/2017   ATN (acute tubular necrosis) ([place]) 10/08/2017   Septic shock ([place]) 10/04/2017   Acute cholecystitis s/p perc cholecystomy drainage 10/04/2017    Acute respiratory failure with hypoxia ([place])    Chronic back pain 02/08/2016  Controlled type 2 diabetes mellitus with diabetic neuropathy, without long-term  current use of insulin ([place]) 02/08/2016   Insomnia 02/08/2016   History of diverticulitis 02/08/2016   HYPERTENSION, BENIGN ESSENTIAL, UNCONTROLLED 03/12/2009   ABNORMAL ELECTROCARDIOGRAM 03/12/2009    Immunization History  Administered Date(s) Administered   Fluad Quad(high Dose 65+) 05/28/2019, 07/06/2020   Influenza, High Dose Seasonal PF 06/20/2016, 07/08/2017   PFIZER Comirnaty(Gray Top)Covid-19 Tri-Sucrose Vaccine 11/15/2019, 12/15/2019, 09/02/2020   PFIZER(Purple Top)SARS-COV-2 Vaccination 11/15/2019, 12/15/2019, 09/02/2020   Pneumococcal Conjugate-13 06/20/2016   Pneumococcal Polysaccharide-23 05/28/2019   Zoster Recombinat (Shingrix) 02/19/2020, 05/03/2020    Conditions to be addressed/monitored: HTN, HLD, DMII, and arthritis / pain; GERD, BPH / ED;   There are no care plans that you recently modified to display for this patient.   Medication Assistance:  Mailed application for Praluent patient assistance since he is in Medicare coverage gap but patient will also check with VA primary care provider to see if they will do PA to get thru [place].   Patient's preferred pharmacy is:  [facility], [place] - [address] [place] [address] Phone: [phone] Fax: [phone]  [facility] [address], [place] [address] Phone: [phone] Fax: [phone]  [facility] [place] [facility] [place] [facility] [place] [address] Phone: [phone] Fax: [place] [phone] - [place], [place] [address] [place] [address] Phone: [phone] Fax: [phone]  [id number] [place], [place] - [address] [id number]: [phone]: [phone]  Follow Up:  Patient agrees to Care Plan  and Follow-up.  Plan: Follow up with patient in 1 month.  TCherre Robins PharmD Clinical Pharmacist [facility]

## 2021-05-14 NOTE — Patient Instructions (Signed)
Visit Information  PATIENT GOALS:  Goals Addressed             This Visit's Progress    Chronic Care Management Pharmacy Care Plan       CARE PLAN ENTRY (see longitudinal plan of care for additional care plan information)  Current Barriers:  Chronic Disease Management support, education, and care coordination needs related to Hypertension, Hyperlipidemia, Diabetes, Insomnia, GERD, Pain, BPH/ED   Hypertension BP Readings from Last 3 Encounters:  04/26/21 124/80  07/06/20 136/84  09/25/19 (!) 176/99  Pharmacist Clinical Goal(s): Over the next 90 days, patient will work with PharmD and providers to maintain BP goal <140/90 while minimizing side effects / dizziness and falls. Current regimen:  Losartan HCTZ 100/'25mg'$  - take 1 tablet daily Interventions: Discussed blood pressure goal Discussed diet and exercise Recommended trial of taking 1/2 tablet losartan HCTZ daily for 1 week to see if dizziness improves Recommended patient check blood pressure 1 to 3 times per week, document, and provide at future appointments Recommended he discuss dizziness with [place] physician today Patient self care activities - Over the next 90 days, patient will: Check blood pressure 1 to 3 times per week, document, and provide at future appointments Ensure daily salt intake < 2300 mg/day Discuss dizziness with [place] physician today and consider follow up with neurology to see if there is a cause of dizziness outside of amlodipine.  Ensure daily salt intake < 2300 mg/day  Hyperlipidemia with elevated triglycerides Lipid Panel  Lipid Panel     Component Value Date/Time   CHOL 145 04/26/2021 1201   TRIG (H) 04/26/2021 1201    815.0 Triglyceride is over 400; calculations on Lipids are invalid.   HDL 25.00 (L) 04/26/2021 1201   CHOLHDL 6 04/26/2021 1201   LDLDIRECT 34.0 04/26/2021 1201   Pharmacist Clinical Goal(s): Over the next 90 days, patient will work with PharmD and providers to achieve LDL goal <  100 and Triglycerides <150 Current regimen:  Aspirin '81mg'$  daily Niaspan CR '1000mg'$  at bedtime (lowers triglycerides and can increase HDL) Salmon Oil '1000mg'$  twice daily per [facility] (lowers triglycerides and can increase HDL) Praluent '75mg'$  every 14 days (lowers LDL mostly be also can lower triglycerides) Interventions: Discussed lipid goals and risk associated with elevated TRIG Reviewed foods that can increase triglycerides; continue to limit intake of alcohol, sweets / sugar, potatoes, bread, rice and pasta Reviewed VA coverage of Praluent. Would require prior authorization by [facility] primary care provider. Praluent does have patient assistance program. Mailed application for patient assistance for Praluent incase he is unable to get thru [place].  Patient self care activities - Over the next 90 days, patient will: Maintain cholesterol medication regimen.  Limit intake of foods that can increase triglycerides Encouraged increased physical activity Discuss Praluent coverage with VA primary care provider.   Diabetes Lab Results  Component Value Date/Time   HGBA1C 9.7 (H) 04/26/2021 12:01 PM   HGBA1C 7.8 (H) 07/06/2020 02:48 PM   HGBA1C 8.2 08/12/2018 12:00 AM  A1c at Dr Theodosia Blender office was 10.6% on 02/10/2021  Pharmacist Clinical Goal(s): Over the next 90 days, patient will work with PharmD and providers to achieve A1c goal <7% Current regimen:  Ozempic - inject '1mg'$  weekly Metformin XR '500mg'$  - take 2 tablets = '1000mg'$  twice a day with morning and evening meals Interventions: Discussed A1c and blood glucose goals Reviewed home blood glucose readings and reviewed goals  Fasting blood glucose goal (before meals) = 80 to 130 Blood glucose goal  after a meal = less than 180  Discussed medication options - might be able to increase Ozempic to '2mg'$  weekly Patient self care activities - Over the next 90 days, patient will: Check blood sugar once daily, document, and provide at future appointments Limit  intake of high carbohyrates foods and sugar - continue with low carbohydrate diet Continue to increase physical activity - goal is to work up to at least 150 minutes per week.  Contact provider with any episodes of hypoglycemia or BP over 250.    Prostate Health /  elevated PSA; Interventions: Follow up with [facility] clinical at [facility] Patient self care activities - Over the next 30 days, patient will: Continue to follow up with [facility] urology clinic   Osteoarthritis Pharmacist Clinical Goal(s) Over the next 90 days, patient will work with PharmD and providers to reduce symptoms associated with arthritis Current regimen:  Alpha lipoic acid '600mg'$  daily  Diclofenac '75mg'$  up to twice a day if needed for pain Patient self care activities - Over the next 90 days, patient will: Maintain medication regimen for arthritis    GERD Pharmacist Clinical Goal(s) Over the next 90 days, patient will work with PharmD and providers to reduce symptoms associated with GERD Current regimen:  Famotidine '20mg'$  twice daily Omeprazole '40mg'$  daily at bedtime Patient self care activities - Over the next 90 days, patient will: Take omeprazole in morning on empty stomach and take famotidine at bedtime  Medication management Pharmacist Clinical Goal(s): Over the next 90 days, patient will work with PharmD and providers to maintain optimal medication adherence [facility] Interventions Comprehensive medication review performed. Continue current medication management strategy Verified patient has reduced vitamin B12 supplement to every other day as recommended by Dr Lorelei Pont after last labs. Patient self care activities - Over the next 90 days, patient will: Focus on medication adherence by filling and taking medications appropriately  Take medications as prescribed Report any questions or concerns to PharmD and/or provider(s) Continue to take vitamin B12 every other  day   Patient Goals/Self-Care Activities Over the next 180 days, patient will: take medications as prescribed check glucose daily , document, and provide at future appointments,  check blood pressure 1 to 3 times per week, document, and provide at future appointments collaborate with providers for follow up appointments with primary care physician - Dr Lorelei Pont and urologist - Dr Amalia Hailey  Follow Up Plan: Telephone follow up appointment with care management team member scheduled for:  4 to 6 weeks    Please see past updates related to this goal by clicking on the "Past Updates" button in the selected goal          Patient verbalizes understanding of instructions provided today and agrees to view in [place].   Telephone follow up appointment with care management team member scheduled for: 4 to 6 weeks  Cherre Robins, PharmD Clinical Pharmacist [facility] Primary [facility] [address] [place] [facility]

## 2021-06-06 NOTE — Progress Notes (Deleted)
[facility]  PATIENT: Timothy Chapman DOB: 06/30/1946  REFERRING CLINICIAN: Copland, Gay Filler, MD HISTORY FROM: *** REASON FOR VISIT: falls, neuropathy***   HISTORICAL  CHIEF COMPLAINT:  No chief complaint on file.   HISTORY OF PRESENT ILLNESS:  ***  The patient does not*** report symptoms referable to autonomic dysfunction including impaired sweating, heat or cold intolerance, excessive mucosal dryness, gastroparetic early satiety, postprandial abdominal bloating, constipation, bowel or bladder dyscontrol, erectile dysfunction*** or syncope/presyncope/orthostatic intolerance. There are no*** complaints relating to other symptoms of small fiber modalities including paresthesia/pain.  OTHER MEDICAL CONDITIONS: HTN, DM2   REVIEW OF SYSTEMS: Full 14 system review of systems performed and negative with exception of: ***  ALLERGIES: Allergies  Allergen Reactions   Codeine     hallucinations   Farxiga [Dapagliflozin] Other (See Comments)    Genital infection and irritation   Statins     Has tried several cannot tolerate    HOME MEDICATIONS: Outpatient Medications Prior to Visit  Medication Sig Dispense Refill   Acetylcarnitine HCl 500 MG CAPS Take 500 mg by mouth daily.     Alcohol Swabs (B-D SINGLE USE SWABS REGULAR) PADS USE PRIOR TO INSULIN DOSE 200 each 7   Alirocumab (PRALUENT) 75 MG/ML SOAJ INJECT 75MG  INTO THE SKIN EVERY 14 DAYS (Patient not taking: Reported on 05/10/2021) 2 mL 11   Alpha-Lipoic Acid 600 MG CAPS Take 600 mg by mouth daily.     aspirin 81 MG chewable tablet Chew 1 tablet (81 mg total) by mouth daily. 30 tablet 0   diclofenac (VOLTAREN) 75 MG EC tablet TAKE 1 TABLET TWICE A DAY  AS NEEDED FOR JOINT PAINS (Patient taking differently: TAKE 1 TABLET TWICE A DAY  AS NEEDED FOR JOINT PAINS) 180 tablet 2   famotidine (PEPCID) 20 MG tablet Take 1 tablet (20 mg total) by mouth 2 (two) times daily. 180 tablet 3   Lancets (ONETOUCH DELICA  PLUS [id number]) MISC USE TWO TIMES A DAY TO     CHECK BLOOD SUGAR 100 each 3   losartan-hydrochlorothiazide (HYZAAR) 100-25 MG tablet Take 1 tablet by mouth daily.     melatonin 5 MG TABS Take 5 mg by mouth at bedtime.      metFORMIN (GLUCOPHAGE-XR) 500 MG 24 hr tablet Take 2 tablets (1,000 mg total) by mouth 2 (two) times daily after a meal. Take with food 360 tablet 3   Multiple Vitamin (MULTIVITAMIN) tablet Take 1 tablet by mouth daily.     niacin (NIASPAN) 1000 MG CR tablet Take 1,000 mg by mouth at bedtime.     omeprazole (PRILOSEC) 40 MG capsule TAKE 1 CAPSULE DAILY 90 capsule 1   ONETOUCH VERIO test strip USE AS INSTRUCTED 100 strip 7   OVER THE COUNTER MEDICATION Singing nettle daily for prostate     tadalafil (CIALIS) 10 MG tablet Take 1 tablet (10 mg total) by mouth daily as needed for erectile dysfunction. Max 20 mg total per day 90 tablet 2   tadalafil (CIALIS) 5 MG tablet Take 1 tablet (5 mg total) by mouth daily. 90 tablet 2   vitamin C (ASCORBIC ACID) 500 MG tablet Take 500 mg by mouth daily.     No facility-administered medications prior to visit.    PAST MEDICAL HISTORY: Past Medical History:  Diagnosis Date   AKI (acute kidney injury) (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   Cannot tolerate statins   GERD (gastroesophageal reflux disease)    Hypertension    Neuromuscular disorder ([facility])    neuropathy but being treated   PONV (postoperative nausea and vomiting)    Sleep apnea    has a sleep study but refuses that he has sleep study, not wearing CPAP    PAST SURGICAL HISTORY: Past Surgical History:  Procedure Laterality Date   BACK SURGERY     X2 - lower   CHOLECYSTECTOMY N/A 12/20/2017   Procedure: LAPAROSCOPIC CHOLECYSTECTOMY WITH INTRAOPERATIVE CHOLANGIOGRAM POSSIBLE OPEN;  Surgeon: Fanny Skates, MD;  Location: [facility];  Service: General;   Laterality: N/A;   COLONOSCOPY     ESOPHAGOGASTRODUODENOSCOPY     IR RADIOLOGIST EVAL & MGMT  10/30/2017   IR RADIOLOGIST EVAL & MGMT  11/14/2017   PROSTATE SURGERY  2017   uro lift per pt.   REPLACEMENT TOTAL KNEE Left    ROTATOR CUFF REPAIR     Bil    FAMILY HISTORY: Family History  Problem Relation Age of Onset   Heart failure Father    Heart disease Father    Emphysema Mother    Colon cancer Maternal Grandmother     SOCIAL HISTORY: Social History   Socioeconomic History   Marital status: Married    Spouse name: Not on file   Number of children: Not on file   Years of education: Not on file   Highest education level: Not on file  Occupational History   Not on file  Tobacco Use   Smoking status: Former    Types: Cigarettes    Quit date: 09/18/1975    Years since quitting: 45.7   Smokeless tobacco: Never  Vaping Use   Vaping Use: Never used  Substance and Sexual Activity   Alcohol use: Not Currently    Alcohol/week: 0.0 standard drinks    Comment: Rarely   Drug use: Never   Sexual activity: Not on file  Other Topics Concern   Not on file  Social History Narrative   Not on file   Social Determinants of Health   Financial Resource Strain: Medium Risk   Difficulty of Paying Living Expenses: Somewhat hard  Food Insecurity: No Food Insecurity   Worried About Charity fundraiser in the Last Year: Never true   Ran Out of Food in the Last Year: Never true  Transportation Needs: No Transportation Needs   Lack of Transportation (Medical): No   Lack of Transportation (Non-Medical): No  Physical Activity: Insufficiently Active   Days of Exercise per Week: 3 days   Minutes of Exercise per Session: 20 min  Stress: No Stress Concern Present   Feeling of Stress : Only a little  Social Connections: Moderately Isolated   Frequency of Communication with Friends and Family: More than three times a week   Frequency of Social Gatherings with Friends and Family: More than  three times a week   Attends Religious Services: Never   Marine scientist or Organizations: No   Attends Music therapist: Never   Marital Status: Married  Human resources officer Violence: Not At Risk   Fear of Current or Ex-Partner: No   Emotionally Abused: No   Physically Abused: No   Sexually Abused: No     PHYSICAL EXAM ***  GENERAL EXAM/CONSTITUTIONAL: Vitals: There were no vitals filed for this visit. There is no height or weight on file  to calculate BMI. Wt Readings from Last 3 Encounters:  04/26/21 236 lb (107 kg)  02/22/21 245 lb (111.1 kg)  07/06/20 253 lb (114.8 kg)   Patient is in no distress; well developed, nourished and groomed; neck is supple  CARDIOVASCULAR: Examination of carotid arteries is normal; no carotid bruits Regular rate and rhythm, no murmurs Examination of peripheral vascular system by observation and palpation is normal  EYES: Pupils round and reactive to light, Visual fields full to confrontation, Extraocular movements intacts,   MUSCULOSKELETAL: Gait, strength, tone, movements noted in Neurologic exam below  NEUROLOGIC: MENTAL STATUS:  No flowsheet data found. awake, alert, oriented to person, place and time recent and remote memory intact normal attention and concentration language fluent, comprehension intact, naming intact fund of knowledge appropriate  CRANIAL NERVE:  2nd - no papilledema or hemorrhages on fundoscopic exam 2nd, 3rd, 4th, 6th - pupils equal and reactive to light, visual fields full to confrontation, extraocular muscles intact, no nystagmus 5th - facial sensation symmetric 7th - facial strength symmetric 8th - hearing intact 9th - palate elevates symmetrically, uvula midline 11th - shoulder shrug symmetric 12th - tongue protrusion midline  MOTOR:  normal bulk and tone, full strength in the BUE, BLE  SENSORY:  normal and symmetric to light touch, pinprick, temperature, vibration  COORDINATION:   finger-nose-finger, fine finger movements normal  REFLEXES:  deep tendon reflexes present and symmetric  GAIT/STATION:  normal     DIAGNOSTIC DATA (LABS, IMAGING, TESTING) - I reviewed patient records, labs, notes, testing and imaging myself where available.  Lab Results  Component Value Date   WBC 6.9 04/26/2021   HGB 12.1 (L) 04/26/2021   HCT 36.5 (L) 04/26/2021   MCV 88.1 04/26/2021   PLT 232.0 04/26/2021      Component Value Date/Time   NA 134 (L) 04/26/2021 1201   NA 137 08/12/2018 0000   K 4.6 04/26/2021 1201   CL 100 04/26/2021 1201   CO2 21 04/26/2021 1201   GLUCOSE 158 (H) 04/26/2021 1201   BUN 32 (H) 04/26/2021 1201   CREATININE 1.44 04/26/2021 1201   CREATININE 1.20 (H) 07/06/2020 1448   CALCIUM 9.5 04/26/2021 1201   PROT 7.5 04/26/2021 1201   ALBUMIN 4.2 04/26/2021 1201   AST 32 04/26/2021 1201   ALT 33 04/26/2021 1201   ALKPHOS 48 04/26/2021 1201   BILITOT 0.4 04/26/2021 1201   GFRNONAA 59 (L) 12/12/2017 1316   GFRAA >60 12/12/2017 1316   Lab Results  Component Value Date   CHOL 145 04/26/2021   HDL 25.00 (L) 04/26/2021   LDLCALC  07/06/2020     Comment:     . LDL cholesterol not calculated. Triglyceride levels greater than 400 mg/dL invalidate calculated LDL results. . Reference range: <100 . Desirable range <100 mg/dL for primary prevention;   <70 mg/dL for patients with CHD or diabetic patients  with > or = 2 CHD risk factors. Marland Kitchen LDL-C is now calculated using the Martin-Hopkins  calculation, which is a validated novel method providing  better accuracy than the Friedewald equation in the  estimation of LDL-C.  Cresenciano Genre et al. Annamaria Helling. 4315;400(86): 2061-2068  (http://education.[facility].com/faq/FAQ164)    LDLDIRECT 34.0 04/26/2021   TRIG (H) 04/26/2021    815.0 Triglyceride is over 400; calculations on Lipids are invalid.   CHOLHDL 6 04/26/2021   Lab Results  Component Value Date   HGBA1C 9.7 (H) 04/26/2021   Lab Results   Component Value Date   VITAMINB12 >1550 (H)  04/26/2021   No results found for: TSH  01/16/21: TSH 1.02    ASSESSMENT AND PLAN  75 y.o. year old male with ***   No diagnosis found.    PLAN: PT***IFE, kappa/lamda light chain***EMG***  No orders of the defined types were placed in this encounter.   No orders of the defined types were placed in this encounter.   No follow-ups on file.    Genia Harold, MD  [facility] [address] [place], [place] [address] [phone]

## 2021-06-07 ENCOUNTER — Ambulatory Visit: Payer: Medicare HMO | Admitting: Psychiatry

## 2021-06-13 ENCOUNTER — Telehealth: Payer: Medicare HMO

## 2021-06-20 ENCOUNTER — Ambulatory Visit (INDEPENDENT_AMBULATORY_CARE_PROVIDER_SITE_OTHER): Payer: Medicare HMO | Admitting: Pharmacist

## 2021-06-20 DIAGNOSIS — I10 Essential (primary) hypertension: Secondary | ICD-10-CM

## 2021-06-20 DIAGNOSIS — E1165 Type 2 diabetes mellitus with hyperglycemia: Secondary | ICD-10-CM

## 2021-06-20 DIAGNOSIS — R972 Elevated prostate specific antigen [PSA]: Secondary | ICD-10-CM

## 2021-06-20 DIAGNOSIS — E785 Hyperlipidemia, unspecified: Secondary | ICD-10-CM

## 2021-06-22 ENCOUNTER — Encounter: Payer: Self-pay | Admitting: Family Medicine

## 2021-06-22 NOTE — Chronic Care Management (AMB) (Signed)
Chronic Care Management Pharmacy Note  06/22/2021 Name:  Timothy Chapman MRN:  [id number] DOB:  01-Oct-1945  Summary: A1c improving but still above goal. He has been able to increase Ozempic a little and is taking 1.8m weekly (was unable to tolerate 211mdaily).    Following low CHO diet with weight loss of 30 lbs over the last 6 months.  In Medicare coverage gap - cannot afford Praluent - as not taken in 2 months. Application for assistance sent last month but patient has not completed.  Patient mentions he would like to be able to just take losartan 10076mnstead of losartan-hydrochlorothiazide 100/72m46me blood pressure has been at goal recently.   Recommendations/Changes made from today's visit: Continue low CHO diet and weight loss.  Encouraged patient to complete application for patient assistance for Praluent Will check with PCP about possibly lowering dose of hydrochlorothiazide to 12.5mg 30m monitoring blood pressure for 1 month. If remains at goal could discontinue hydrochlorothiazide.   Subjective: Timothy Chapman 74 y.75 year old male who is a primary patient of Copland, JessiGay Filler  The CCM team was consulted for assistance with disease management and care coordination needs.    Engaged with patient by telephone for follow up visit in response to provider referral for pharmacy case management and/or care coordination services.   Consent to Services:  The patient was given information about Chronic Care Management services, agreed to services, and gave verbal consent prior to initiation of services.  Please see initial visit note for detailed documentation.   Patient Care Team: Copland, JessiGay Filleras PCP - General (Family Medicine) EckarCherre RobinsrmD (Pharmacist)  Recent office visits: 04/26/21 JessiLamar Blinks Vertigo/dizziness - labs ordered - referred to community neurology for evaluation. Patient advised after labs returned to restart Praluent and  decreaes B12 supplement to every OTHER day 03/29/2021 - phone call - reported metallic taste with Paxlovid; recommended he stop 03/28/2021 - Dr WesleJoyce GrossVID positive on 03/24/2021. Prescribed Paxlovid 300mg 57mfor 5 days and benzonatate 200mg u4m tid prn for 14 days.  03/02/2021 - Dr Nelliston Joyce Grossfor Type 2 DM and HTN; no med changes.  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 Good response to Eylea. Received #13 OVE of left eyee at this visit. F/U in 8 weeks for reevluation and IVE. 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 Stressed better BG control with goal of A1c < 7.0. Monitor cataract of both eyes.  [facility] PSA / malignant neoplasm of prostate. No med changes - has follow up 04/06/2021 02/14/2021 - Ophthalmology  - Dr Shah for Manuella Ghazietic eye exam and [facility] in previous 6 months  Objective:  Lab Results  Component Value Date   CREATININE 1.44 04/26/2021   CREATININE 1.20 (H) 07/06/2020   CREATININE 1.22 05/28/2019    Lab Results  Component Value Date   HGBA1C 9.7 (H) 04/26/2021   Last diabetic Eye exam: No results found for: HMDIABEYEEXA  Last diabetic Foot exam: No results found for: HMDIABFOOTEX      Component Value Date/Time   CHOL 145 04/26/2021 1201  TRIG (H) 04/26/2021 1201    815.0 Triglyceride is over 400; calculations on Lipids are invalid.   HDL 25.00 (L) 04/26/2021 1201   CHOLHDL 6 04/26/2021 1201   VLDL 57.0 (H) 07/09/2017 0826   LDLCALC  07/06/2020 1448     Comment:     . LDL cholesterol not calculated. Triglyceride levels greater than 400 mg/dL invalidate calculated LDL  results. . Reference range: <100 . Desirable range <100 mg/dL for primary prevention;   <70 mg/dL for patients with CHD or diabetic patients  with > or = 2 CHD risk factors. Marland Kitchen LDL-C is now calculated using the Martin-Hopkins  calculation, which is a validated novel method providing  better accuracy than the Friedewald equation in the  estimation of LDL-C.  Cresenciano Genre et al. Annamaria Helling. 0175;102(58): 2061-2068  (http://education.[employer].com/faq/FAQ164)    LDLDIRECT 34.0 04/26/2021 1201    Hepatic Function Latest Ref Rng & Units 04/26/2021 07/06/2020 05/28/2019  Total Protein 6.0 - 8.3 g/dL 7.5 7.8 7.5  Albumin 3.5 - 5.2 g/dL 4.2 - 4.4  AST 0 - 37 U/L 32 35 20  ALT 0 - 53 U/L 33 70(H) 27  Alk Phosphatase 39 - 117 U/L 48 - 40  Total Bilirubin 0.2 - 1.2 mg/dL 0.4 0.4 0.3  Bilirubin, Direct 0.1 - 0.5 mg/dL - - -    No results found for: TSH, FREET4  CBC Latest Ref Rng & Units 04/26/2021 07/06/2020 05/28/2019  WBC 4.0 - 10.5 K/uL 6.9 8.9 7.2  Hemoglobin 13.0 - 17.0 g/dL 12.1(L) 13.3 12.2(L)  Hematocrit 39.0 - 52.0 % 36.5(L) 40.7 37.5(L)  Platelets 150.0 - 400.0 K/uL 232.0 247 276.0    No results found for: VD25OH  Clinical ASCVD: Yes  The 10-year ASCVD risk score (Arnett DK, et al., 2019) is: 48.9%   Values used to calculate the score:     Age: 37 years     Sex: Male     Is Non-Hispanic African American: No     Diabetic: Yes     Tobacco smoker: Yes     Systolic Blood Pressure: 527 mmHg     Is BP treated: Yes     HDL Cholesterol: 25 mg/dL     Total Cholesterol: 145 mg/dL    Social History   Tobacco Use  Smoking Status Former   Types: Cigarettes   Quit date: 09/18/1975   Years since quitting: 45.7  Smokeless Tobacco Never   BP Readings from Last 3 Encounters:  04/26/21 124/80  07/06/20 136/84  09/25/19 (!) 176/99   Pulse Readings from Last 3 Encounters:  04/26/21 87  07/06/20 86  09/25/19 74   Wt Readings from Last 3 Encounters:  04/26/21 236 lb (107 kg)   02/22/21 245 lb (111.1 kg)  07/06/20 253 lb (114.8 kg)    Assessment: Review of patient past medical history, allergies, medications, health status, including review of consultants reports, laboratory and other test data, was performed as part of comprehensive evaluation and provision of chronic care management services.   SDOH:  (Social Determinants of Health) assessments and interventions performed:  SDOH Interventions    Flowsheet Row Most Recent Value  SDOH Interventions   Financial Strain Interventions Other (Comment)  [Mailed patient application for Praluent last month. He acknowledges that he has received but has not completed.]        CCM Care Plan  Allergies  Allergen Reactions   Codeine     hallucinations   Farxiga [Dapagliflozin] Other (See Comments)    Genital  infection and irritation   Statins     Has tried several cannot tolerate    Medications Reviewed Today     Reviewed by Cherre Robins, PharmD (Pharmacist) on 06/22/21 at 0459  Med List Status: <None>   Medication Order Taking? Sig Documenting Provider Last Dose Status Informant  Acetylcarnitine HCl 500 MG CAPS [id number] Yes Take 500 mg by mouth daily. [provider] Taking Active Self           Med Note Rosemarie Beath, MELISSA B   Thu Nov 21, 2017 12:03 PM)    Alcohol Swabs (B-D SINGLE USE SWABS REGULAR) PADS [id number] Yes USE PRIOR TO INSULIN DOSE Copland, Gay Filler, MD Taking Active   Alirocumab (PRALUENT) 75 MG/ML SOAJ [id number] No INJECT $RemoveB'75MG'LDxkfrfC$  INTO THE SKIN EVERY 14 DAYS  Patient not taking: Reported on 06/20/2021   Copland, Gay Filler, MD Not Taking Active   Alpha-Lipoic Acid 600 MG CAPS [id number] Yes Take 600 mg by mouth daily. [provider] Taking Active Self           Med Note Antony Contras, Dayshon Roback B   Tue Apr 04, 2021  1:16 PM)    aspirin 81 MG chewable tablet [id number] Yes Chew 1 tablet (81 mg total) by mouth daily. Shelly Coss, MD Taking Active Self  diclofenac (VOLTAREN) 75 MG EC  tablet [id number] Yes TAKE 1 TABLET TWICE A DAY  AS NEEDED FOR JOINT PAINS  Patient taking differently: TAKE 1 TABLET TWICE A DAY  AS NEEDED FOR JOINT PAINS   Copland, Gay Filler, MD Taking Active            Med Note Lynita Lombard Sep 29, 2020  3:10 PM) Recommended not to take by [facility]  famotidine (PEPCID) 20 MG tablet [id number] Yes Take 1 tablet (20 mg total) by mouth 2 (two) times daily. Copland, Gay Filler, MD Taking Active            Med Note Theodosia Blender Apr 04, 2021  1:18 PM) Taking as needed  Lancets (ONETOUCH DELICA PLUS [id number]) [place] [id number] Yes USE TWO TIMES A DAY TO     CHECK BLOOD SUGAR Copland, Gay Filler, MD Taking Active   losartan-hydrochlorothiazide (HYZAAR) 100-25 MG tablet [id number] Yes Take 1 tablet by mouth daily. [provider] Taking Active   melatonin 5 MG TABS [id number] Yes Take 5 mg by mouth at bedtime.  [provider] Taking Active Self  metFORMIN (GLUCOPHAGE-XR) 500 MG 24 hr tablet [id number] Yes Take 2 tablets (1,000 mg total) by mouth 2 (two) times daily after a meal. Take with food Copland, Gay Filler, MD Taking Active   Multiple Vitamin (MULTIVITAMIN) tablet [id number] Yes Take 1 tablet by mouth daily. [provider] Taking Active Self  niacin (NIASPAN) 1000 MG CR tablet [id number] Yes Take 1,000 mg by mouth at bedtime. [provider] Taking Active   omeprazole (PRILOSEC) 40 MG capsule [id number] Yes TAKE 1 CAPSULE DAILY Copland, Gay Filler, MD Taking Active   [facility] VERIO test strip [id number] Yes USE AS INSTRUCTED Copland, Gay Filler, MD Taking Active   OVER THE COUNTER MEDICATION [id number] Yes Singing nettle daily for prostate [provider] Taking Active   Semaglutide, 2 MG/DOSE, (OZEMPIC, 2 MG/DOSE,) 8 MG/3ML SOPN [id number] Yes Inject 1.5 mg into the skin once a week. [provider] Taking Active   tadalafil (CIALIS) 10 MG tablet [id number] Yes Take 1 tablet (10 mg total) by mouth daily as needed  for  erectile dysfunction. Max 20 mg total per day Copland, Gay Filler, MD Taking Active   tadalafil (CIALIS) 5 MG tablet [id number] No Take 1 tablet (5 mg total) by mouth daily.  Patient not taking: Reported on 06/20/2021   Copland, Gay Filler, MD Not Taking Active   vitamin C (ASCORBIC ACID) 500 MG tablet [id number] Yes Take 500 mg by mouth daily. 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   Paroxysmal atrial fibrillation ([place]) 12/16/2017   SBO (small bowel obstruction) ([place]) 10/08/2017   ATN (acute tubular necrosis) ([place]) 10/08/2017   Septic shock ([place]) 10/04/2017   Acute cholecystitis s/p perc cholecystomy drainage 10/04/2017    Acute respiratory failure with hypoxia ([facility])    Chronic back pain 02/08/2016   Controlled type 2 diabetes mellitus with diabetic neuropathy, without long-term current use of insulin ([place]) 02/08/2016   Insomnia 02/08/2016   History of diverticulitis 02/08/2016   HYPERTENSION, BENIGN ESSENTIAL, UNCONTROLLED 03/12/2009   ABNORMAL ELECTROCARDIOGRAM 03/12/2009    Immunization History  Administered Date(s) Administered   Fluad Quad(high Dose 65+) 05/28/2019, 07/06/2020   Influenza, High Dose Seasonal PF 06/20/2016, 07/08/2017   PFIZER Comirnaty(Gray Top)Covid-19 Tri-Sucrose Vaccine 11/15/2019, 12/15/2019, 09/02/2020   PFIZER(Purple Top)SARS-COV-2 Vaccination 11/15/2019, 12/15/2019, 09/02/2020   Pneumococcal Conjugate-13 06/20/2016   Pneumococcal Polysaccharide-23 05/28/2019   Zoster Recombinat (Shingrix) 02/19/2020, 05/03/2020    Conditions to be addressed/monitored: HTN, HLD, DMII, and arthritis / pain; GERD, BPH / ED;   Care Plan : General Pharmacy (Adult)  Updates made by Cherre Robins, PHARMD since 06/22/2021 12:00 AM     Problem: Chronic Disease Management support, education, and care coordination needs related to HTN, Hyperlipidemia, Diabetes, Insomnia,  GERD, Pain, BPH/ED   Note:   Current Barriers:  Unable to maintain control of type 2 DM Does not notify physician's office if has questions or concerns In need of Chronic Disease Management support, education, and care coordination related to Hypertension, Hyperlipidemia, Diabetes, GERD (with Barrett's Esophagus); NASH; Pain, BPH/ED History of low adherence to maintenance medications Unable to afford therapy for hyperlipidemia (Praluent)   Pharmacist Clinical Goal(s):  Over the next 180 days, patient will verbalize ability to afford treatment regimen achieve adherence to monitoring guidelines and medication adherence to achieve therapeutic efficacy achieve control of diabetes and hypertension as evidenced by A1c < 7.0% and BP <140/90 adhere to prescribed medication regimen as evidenced by refill history and meeting of goal mentioned below  through collaboration with PharmD and provider.  Better understand medication regimen and improve adherence to medication therapy Maintain communication with PCP's office   Interventions: 1:1 collaboration with Copland, Gay Filler, MD regarding development and update of comprehensive plan of care as evidenced by provider attestation and co-signature Inter-disciplinary care team collaboration (see longitudinal plan of care) Comprehensive medication review performed; medication list updated in electronic medical record  Diabetes: Uncontrolled - A1c goal <7.0% A1c is not at goal but improved from 10.6% 02/10/2021 to 9.7% 04/26/2021 Current regimen:  Ozempic - inject 1.47m weekly Metformin XR 5037m- take 2 tablets = 100012mwice a day with morning and evening meals Was unable to tolerate FarIrancaused irritation and infection of genitals; Rybelsus - cost and more nasuea than Ozempic Also was unable to tolerated higher Ozempic dose 2mg11mekly - caused nausea so he restarted 1mg 58me. Today he reports that he has increased dose to 1.5mg w3mly  and has  been tolerating well.  Had one episode of nausea Patient reports recent home BG: 136 today; ranging from 115 to 190 [facility] was high normal at 4.06 Denies BG <80 / hypoglycemic symptoms Eye Exam completed 06/27/2021 at [facility] - patient states he has been following low carb/ keto type diet with intermittent fasting. Patient denies eating sugar or drinking juices or sodas; tries to limit CHO intake; Has lost 30 lbs in the last 6 months Exercise: using treadmill a few times per week Interventions: Discussed A1c and blood glucose goals Reviewed home blood glucose readings and reviewed goals  Fasting blood glucose goal (before meals) = 80 to 130 Blood glucose goal after a meal = less than 180  Continue with current medication. If A1c still > 9.0 at next visit, consider long acting insulin addition  Hypertension: BP Readings from Last 3 Encounters:  04/26/21 124/80  07/06/20 136/84  09/25/19 (!) 176/99  Controlled per most recent office visits and home blood pressure  Goal BP <140/90 Home BP readings have been 130-140 / 70-80. Blood pressure at home today was 136/80  Current regimen:  Losartan HCTZ 100/31m - take 1 tablet daily Previous medications tried: benazepril - switched to losartan; amlodipine - stopped due to dizziness.  Patient reports he was falling a lot when he was taking amlodipine; Dr WJoyce Grosswith AFabensstopped amlodipine in May and patient reports he is no longer falling but still has some dizziness. He would like to try to stop the diuretic and just take losartan. States his ankles "are skinny and shriveled up" He was suppose to see neurology for evaluation of dizziness in September but he had to rescheduled. Will see neurology next week 06/2021.  Interventions: Recommended patient check blood pressure 1 to 3 times per week, document, and provide at future appointments  Discussed blood pressure goal Discussed diet  and exercise Ensure daily salt intake < 2300 mg/day Will consult with PCP about possible changing to losartan 1042m+ hydrochlorothiazide 12.54m10mmonitoring blood pressure for 1 month and if stable and no edema trying to discontinue hydrochlorothiazide.   Hyperlipidemia with elevated triglycerides LDL at goal but Tg very elevated; LDL goal < 100 and Triglycerides <150 Current regimen:  Niaspan CR 1000m32m bedtime - take aspirin 81mg110mor to niaspan to decrease fl[place] Salmon Oil 1000mg 79me daily per [facility]  Pr[place]ouent 754mg e47m 14 days (has not taken in 2 months due to cost) Patient has VA benefits - contacted VA to see if Praluent is on formulary. Unfortunately needs prior approval from [facility] PCP.[place] states he will discuss with his [facility] PCP.[place] did [place] application for Praluent medication assistance but patient has not completed yet.  Patient denied alcohol use Interventions: Discussed lipid goals and risk associated with elevated trilycerides Reviewed foods that can increase triglycerides; encouraged patient to continue to limit intake of alcohol, sweets / sugar, potatoes, bread, rice and pasta Encouraged increased physical activity Patient will discuss Praluent with his [facility] primary care provider.  Encouraged patient to complete application for medication assistance for Praluent.   Chronic Back Pain / Osteoarthritis Controlled Current regimen:  Alpha lipoic acid 600mg da32m Diclofenac 754mg up 46mwice a day if needed for pain Patient self care activities - Over the next 90 days, patient will: Maintain medication regimen for arthritis    GERD Controlled Current regimen:  Famotidine 20mg twic62mily Omeprazole 40mg daily86mbedtime  Patient self care activities - Over the next 90 days, patient will: Take omeprazole in morning on empty stomach and take famotidine at bedtime  Insomnia:  Current Regimen:  Melatonin 67m qhs Patient reports he is sleeping fairly well at night.  He  does have a new 830month old at home and some nights he is able to sleep better that other.  Interventions: none Continue current regimen for sleep  BPH with elevated PSA; Last PSA was [facility] at 8.02 [facility]06/[facility].0 Dr CLorelei Pontrecommended patient follow up with urologist.  [facility]  Medication management [facility] Interventions Comprehensive medication review performed. Medication list updated Reviewed refill history and discussed adherence with patient. Noted that metformin refill for August was about 20 days late. Will continue to follow adherence. Dicussed with patient today.  Continue current medication management strategy    Patient Goals/Self-Care Activities Over the next 180 days, patient will:  take medications as prescribed, check glucose daily , document, and provide at future appointments, check blood pressure 1 to 3 times per week, document, and provide at future appointments, and collaborate with provider on medication access solutions  Follow Up Plan: Telephone follow up appointment with care management team member scheduled for:  4 to 6 weeks              Medication Assistance:  Mailed application for Praluent patient assistance since he is in Medicare coverage gap but patient will also check with VA primary care provider to see if they will [place]   [place] mailed in August 2022 - has not completed. Encouraged patient to complete and return to office ASAP.   [facility] [facility]:  [facility] [place] [facility] [address]Phone: [phone]: [phone] [place] [place]Phone: [phone]: [phone] [facility] [place] [place]istered [place] [address]Phone: [phone]: [phone]- [place] [place] [address]Phone: [phone]: [phone] [id number] [place] - [address] [place]: [phone]: [phone] Follow Up:  Patient agrees to Care Plan and Follow-up.  Plan: Follow up with patient in 1 month.  TaCherre RobinsPharmD Clinical Pharmacist [facility]

## 2021-06-22 NOTE — Patient Instructions (Signed)
Visit Information  PATIENT GOALS:  Goals Addressed             This Visit's Progress    Chronic Care Management Pharmacy Care Plan   On track    CARE PLAN ENTRY (see longitudinal plan of care for additional care plan information)  Current Barriers:  Chronic Disease Management support, education, and care coordination needs related to Hypertension, Hyperlipidemia, Diabetes, Insomnia, GERD, Pain, BPH/ED   Hypertension BP Readings from Last 3 Encounters:  04/26/21 124/80  07/06/20 136/84  09/25/19 (!) 176/99  Pharmacist Clinical Goal(s): Over the next 90 days, patient will work with PharmD and providers to maintain BP goal <140/90 while minimizing side effects / dizziness and falls. Current regimen:  Losartan HCTZ 100/25mg  - take 1 tablet daily Interventions: Discussed blood pressure goal Discussed diet and exercise Recommended patient check blood pressure 1 to 3 times per week, document, and provide at future appointments Consider change to losartan 100mg  daily + hydrochlorothiazide 12.5mg  daily for 1 month, possibly tapering off hydrochlorothiazide (will discuss with PCP) Patient self care activities - Over the next 90 days, patient will: Check blood pressure 1 to 3 times per week, document, and provide at future appointments Ensure daily salt intake < 2300 mg/day   Hyperlipidemia with elevated triglycerides Lipid Panel     Component Value Date/Time   CHOL 145 04/26/2021 1201   TRIG (H) 04/26/2021 1201    815.0 Triglyceride is over 400; calculations on Lipids are invalid.   HDL 25.00 (L) 04/26/2021 1201   CHOLHDL 6 04/26/2021 1201   LDLDIRECT 34.0 04/26/2021 1201   Pharmacist Clinical Goal(s): Over the next 90 days, patient will work with PharmD and providers to achieve LDL goal < 100 and Triglycerides <150 Current regimen:  Aspirin 81mg  daily Niaspan CR 1000mg  at bedtime (lowers triglycerides and can increase HDL) Salmon Oil 1000mg  twice daily per [place] (lowers  triglycerides and can increase HDL) Praluent 75mg  every 14 days (lowers LDL mostly be also can lower triglycerides) Interventions: Discussed lipid goals and risk associated with elevated triglycerides Reviewed foods that can increase triglycerides; continue to limit intake of alcohol, sweets / sugar, potatoes, bread, rice and pasta Reviewed [place] coverage of Praluent. Would require prior authorization by [facility] primary care provider. Praluent does have medication assistance program. Mailed application for patient assistance for Praluent in September 2022 Patient self care activities - Over the next 90 days, patient will: Maintain cholesterol medication regimen.  Limit intake of foods that can increase triglycerides Encouraged increased physical activity Complete application for Praluent medication assistance and return to our office  Diabetes Lab Results  Component Value Date/Time   HGBA1C 9.7 (H) 04/26/2021 12:01 PM   HGBA1C 7.8 (H) 07/06/2020 02:48 PM   HGBA1C 8.2 08/12/2018 12:00 AM  A1c at Dr Theodosia Blender office was 10.6% on 02/10/2021  Pharmacist Clinical Goal(s): Over the next 90 days, patient will work with PharmD and providers to achieve A1c goal <7% Current regimen:  Ozempic - inject 1.5mg  weekly Metformin XR 500mg  - take 2 tablets = 1000mg  twice a day with morning and evening meals Interventions: Discussed A1c and blood glucose goals Reviewed home blood glucose readings and reviewed goals  Fasting blood glucose goal (before meals) = 80 to 130 Blood glucose goal after a meal = less than 180  Patient self care activities - Over the next 90 days, patient will: Check blood sugar once daily, document, and provide at future appointments Limit intake of high carbohyrates foods and sugar - continue  with low carbohydrate diet Continue to increase physical activity - goal is to work up to at least 150 minutes per week.  Contact provider with any episodes of hypoglycemia or blood glucose over  250 or less than 80  Prostate Health /  elevated PSA; Interventions: Follow up with urology clinic Patient self care activities - Over the next 30 days, patient will: Continue to follow up with urology clinic   Osteoarthritis Pharmacist Clinical Goal(s) Over the next 90 days, patient will work with PharmD and providers to reduce symptoms associated with arthritis Current regimen:  Alpha lipoic acid 600mg  daily  Diclofenac 75mg  up to twice a day if needed for pain Patient self care activities - Over the next 90 days, patient will: Maintain medication regimen for arthritis    GERD Pharmacist Clinical Goal(s) Over the next 90 days, patient will work with PharmD and providers to reduce symptoms associated with GERD Current regimen:  Famotidine 20mg  twice daily Omeprazole 40mg  daily at bedtime Patient self care activities - Over the next 90 days, patient will: Take omeprazole in morning on empty stomach and take famotidine at bedtime  Health Maintenance:  Reviewed vaccination history and discussed benefits of annual flu vaccine and COVID Booster Patient to get the following vaccines at next provider appointment, local pharmacy or [place]: flu vaccine and COVID booster  Medication management Pharmacist Clinical Goal(s): Over the next 90 days, patient will work with PharmD and providers to maintain optimal medication adherence Current pharmacy: [facility] and [facility] Interventions Comprehensive medication review performed. Continue current medication management strategy Verified patient has reduced vitamin B12 supplement to every other day as recommended by Dr Lorelei Pont after last labs. Patient self care activities - Over the next 90 days, patient will: Focus on medication adherence by filling and taking medications appropriately  Take medications as prescribed Report any questions or concerns to PharmD and/or provider(s)   Patient  Goals/Self-Care Activities Over the next 180 days, patient will: take medications as prescribed check glucose daily , document, and provide at future appointments,  check blood pressure 1 to 3 times per week, document, and provide at future appointments collaborate with providers for follow up appointments with primary care physician - Dr Lorelei Pont and urologist - Dr Amalia Hailey  Follow Up Plan: Telephone follow up appointment with care management team member scheduled for:  4 to 6 weeks    Please see past updates related to this goal by clicking on the "Past Updates" button in the selected goal          Patient verbalizes understanding of instructions provided today and agrees to view in [place].   Telephone follow up appointment with care management team member scheduled for: 4 to 6 weeks Cherre Robins, PharmD Clinical Pharmacist [facility] Primary Care [facility]

## 2021-06-27 LAB — HM DIABETES EYE EXAM

## 2021-06-27 MED ORDER — LOSARTAN POTASSIUM 100 MG PO TABS: 100.0000 mg | ORAL_TABLET | Freq: Every day | ORAL | 6 refills | Status: DC

## 2021-06-27 NOTE — Progress Notes (Signed)
[facility]  PATIENT: Timothy Chapman DOB: [birth year]  REFERRING CLINICIAN: Copland, Gay Filler, MD HISTORY FROM: self REASON FOR VISIT: neuropathy, falls   HISTORICAL  CHIEF COMPLAINT:  Chief Complaint  Patient presents with   New Patient (Initial Visit)    Rm 8 with son TJ. Here for consult on worsening neuropathy and increased falls. Pt reports several falls over the last few months, denies any serious injuries.     HISTORY OF PRESENT ILLNESS:  The patient presents for evaluation of neuropathy and falls. He has had neuropathy over the past 10 years. He had 3 falls in the past year due to light-headedness. Has not passed out. He stopped taking amlodipine in June which improved his lightheadedness. Still has some issues with balance and feeling unsteady, especially when he closes his eyes. He has tried multiple treatments for neuropathy previously and is not interested in trying any more medications.   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 This causes a lot of pain and sensitivity to temperature. Can't feel his feet/hands as well as he used to. Takes tylenol which helps a little. Weakness: Denies leg weakness or foot dragging. Pain: yes from neuropathy and stiffness in his joints Medications/alcohol: better since stopping amlodipine, rarely alcohol Diabetes: A1c 9.7, notes early satiety but thinks this may be due to his medication, no diagnosis of gastroparesis  Patient states he has been seeing physical therapy for balance and falls  OTHER MEDICAL CONDITIONS: HTN, paroxysmal afib, DM   REVIEW OF SYSTEMS: Full 14 system review of systems performed and negative with exception of:  imbalance, neuropathy  ALLERGIES: Allergies  Allergen Reactions   Codeine     hallucinations   Farxiga [Dapagliflozin] Other (See Comments)    Genital infection and irritation   Statins     Has tried several cannot tolerate    HOME MEDICATIONS: Outpatient Medications Prior to Visit  Medication Sig Dispense Refill   Acetylcarnitine HCl 500 MG CAPS Take 500 mg by mouth daily.     Alcohol Swabs (B-D SINGLE USE SWABS REGULAR) PADS USE PRIOR TO INSULIN DOSE 200 each 7   Alirocumab (PRALUENT) 75 MG/ML SOAJ INJECT 75MG  INTO THE SKIN EVERY 14 DAYS 2 mL 11   Alpha-Lipoic Acid 600 MG CAPS Take 600 mg by mouth daily.     aspirin 81 MG chewable tablet Chew 1 tablet (81 mg total) by mouth daily. 30 tablet 0   diclofenac (VOLTAREN) 75 MG EC tablet TAKE 1 TABLET TWICE A DAY  AS NEEDED FOR JOINT PAINS (Patient taking differently: TAKE 1 TABLET TWICE A DAY  AS NEEDED FOR JOINT PAINS) 180 tablet 2   famotidine (PEPCID) 20 MG tablet Take 1 tablet (20 mg total) by mouth 2 (two) times daily. 180 tablet 3   Lancets (ONETOUCH DELICA PLUS MEQAST41D) MISC USE TWO TIMES A DAY TO     CHECK BLOOD SUGAR 100 each 3   losartan (COZAAR) 100 MG tablet Take 1 tablet (100 mg total) by mouth daily. 30 tablet 6   melatonin 5 MG TABS Take 5 mg by mouth at bedtime.      metFORMIN (GLUCOPHAGE-XR) 500 MG 24 hr tablet Take 2 tablets (1,000 mg total) by mouth 2 (two) times daily after a meal. Take with  food 360 tablet 3   Multiple Vitamin (MULTIVITAMIN) tablet Take 1 tablet by mouth daily.     niacin (NIASPAN) 1000 MG CR tablet Take 1,000 mg by mouth at bedtime.     omeprazole (PRILOSEC) 40 MG capsule TAKE 1 CAPSULE DAILY 90 capsule 1   ONETOUCH VERIO test strip USE AS INSTRUCTED 100 strip 7   OVER THE COUNTER MEDICATION Singing nettle daily for prostate     Semaglutide, 2 MG/DOSE, (OZEMPIC, 2 MG/DOSE,) 8 MG/3ML SOPN Inject 1.5 mg into the skin once a week.     tadalafil (CIALIS) 10 MG tablet Take 1 tablet (10 mg total) by  mouth daily as needed for erectile dysfunction. Max 20 mg total per day 90 tablet 2   tadalafil (CIALIS) 5 MG tablet Take 1 tablet (5 mg total) by mouth daily. 90 tablet 2   vitamin C (ASCORBIC ACID) 500 MG tablet Take 500 mg by mouth daily.     No facility-administered medications prior to visit.    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   Cannot tolerate statins   GERD (gastroesophageal reflux disease)    Hypertension    Neuromuscular disorder ([facility])    neuropathy but being treated   PONV (postoperative nausea and vomiting)    Sleep apnea    has a sleep study but refuses that he has sleep study, not wearing CPAP    PAST SURGICAL HISTORY: Past Surgical History:  Procedure Laterality Date   BACK SURGERY     X2 - lower   CHOLECYSTECTOMY N/A 12/20/2017   Procedure: LAPAROSCOPIC CHOLECYSTECTOMY WITH INTRAOPERATIVE CHOLANGIOGRAM POSSIBLE OPEN;  Surgeon: Fanny Skates, MD;  Location: [facility];  Service: General;  Laterality: N/A;   COLONOSCOPY     ESOPHAGOGASTRODUODENOSCOPY     IR RADIOLOGIST EVAL & MGMT  10/30/2017   IR RADIOLOGIST EVAL & MGMT  11/14/2017   PROSTATE SURGERY  2017   uro lift per pt.   REPLACEMENT TOTAL KNEE Left    ROTATOR CUFF REPAIR     Bil    FAMILY HISTORY: Family History  Problem Relation Age of Onset   Heart failure Father    Heart disease Father    Emphysema Mother    Colon cancer Maternal Grandmother     SOCIAL HISTORY: Social History   Socioeconomic History   Marital status: Married    Spouse name: Not on file   Number of children: 2   Years of education: Not on file   Highest education level: Not on file  Occupational History   Not on file  Tobacco Use   Smoking status: Former    Types: Cigarettes    Quit date: 09/18/1975    Years  since quitting: 45.8   Smokeless tobacco: Never  Vaping Use   Vaping Use: Never used  Substance and Sexual Activity   Alcohol use: Not Currently    Alcohol/week: 0.0 standard drinks    Comment: Rarely   Drug use: Never   Sexual activity: Not on file  Other Topics Concern   Not on file  Social History Narrative   Right handed   Caffeine 1-2 cups daily   Lives at home with wife and his son.   Social Determinants of Health  Financial Resource Strain: Medium Risk   Difficulty of Paying Living Expenses: Somewhat hard  Food Insecurity: No Food Insecurity   Worried About Charity fundraiser in the Last Year: Never true   Ran Out of Food in the Last Year: Never true  Transportation Needs: No Transportation Needs   Lack of Transportation (Medical): No   Lack of Transportation (Non-Medical): No  Physical Activity: Insufficiently Active   Days of Exercise per Week: 3 days   Minutes of Exercise per Session: 20 min  Stress: No Stress Concern Present   Feeling of Stress : Only a little  Social Connections: Moderately Isolated   Frequency of Communication with Friends and Family: More than three times a week   Frequency of Social Gatherings with Friends and Family: More than three times a week   Attends Religious Services: Never   Marine scientist or Organizations: No   Attends Music therapist: Never   Marital Status: Married  Human resources officer Violence: Not At Risk   Fear of Current or Ex-Partner: No   Emotionally Abused: No   Physically Abused: No   Sexually Abused: No     PHYSICAL EXAM  GENERAL EXAM/CONSTITUTIONAL: Vitals:  Vitals:   06/28/21 1042  BP: (!) 142/84  Pulse: 64  SpO2: 97%  Weight: 232 lb (105.2 kg)  Height: 5\' 9"  (1.753 m)   Body mass index is 34.26 kg/m. Wt Readings from Last 3 Encounters:  06/28/21 232 lb (105.2 kg)  04/26/21 236 lb (107 kg)  02/22/21 245 lb (111.1 kg)   Patient is in no distress; well developed, nourished and  groomed; neck is supple  CARDIOVASCULAR:  Examination of peripheral vascular system by observation and palpation is normal  EYES: Pupils round and reactive to light, Visual fields full to confrontation, Extraocular movements intacts  MUSCULOSKELETAL: Gait, strength, tone, movements noted in Neurologic exam below  NEUROLOGIC: MENTAL STATUS:  awake, alert, oriented to person, place and time recent and remote memory intact normal attention and concentration language fluent, comprehension intact, naming intact fund of knowledge appropriate  CRANIAL NERVE:  2nd, 3rd, 4th, 6th - pupils equal and reactive to light, visual fields full to confrontation, extraocular muscles intact, no nystagmus 5th - facial sensation symmetric 7th - facial strength symmetric 8th - hearing intact 9th - palate elevates symmetrically, uvula midline 11th - shoulder shrug symmetric 12th - tongue protrusion midline  MOTOR:  normal bulk and tone, full strength in the BUE, BLE  SENSORY:  Diminished sensation to pin prick and vibration in bilateral feet, proprioception intact  COORDINATION:  finger-nose-finger, fine finger movements normal  REFLEXES:  deep tendon reflexes 1+ throughout  GAIT/STATION:  normal     DIAGNOSTIC DATA (LABS, IMAGING, TESTING) - I reviewed patient records, labs, notes, testing and imaging myself where available.  Lab Results  Component Value Date   WBC 6.9 04/26/2021   HGB 12.1 (L) 04/26/2021   HCT 36.5 (L) 04/26/2021   MCV 88.1 04/26/2021   PLT 232.0 04/26/2021      Component Value Date/Time   NA 134 (L) 04/26/2021 1201   NA 137 08/12/2018 0000   K 4.6 04/26/2021 1201   CL 100 04/26/2021 1201   CO2 21 04/26/2021 1201   GLUCOSE 158 (H) 04/26/2021 1201   BUN 32 (H) 04/26/2021 1201   CREATININE 1.44 04/26/2021 1201   CREATININE 1.20 (H) 07/06/2020 1448   CALCIUM 9.5 04/26/2021 1201   PROT 7.5 04/26/2021 1201   ALBUMIN 4.2 04/26/2021  1201   AST 32 04/26/2021  1201   ALT 33 04/26/2021 1201   ALKPHOS 48 04/26/2021 1201   BILITOT 0.4 04/26/2021 1201   GFRNONAA 59 (L) 12/12/2017 1316   GFRAA >60 12/12/2017 1316   Lab Results  Component Value Date   CHOL 145 04/26/2021   HDL 25.00 (L) 04/26/2021   LDLCALC  07/06/2020     Comment:     . LDL cholesterol not calculated. Triglyceride levels greater than 400 mg/dL invalidate calculated LDL results. . Reference range: <100 . Desirable range <100 mg/dL for primary prevention;   <70 mg/dL for patients with CHD or diabetic patients  with > or = 2 CHD risk factors. Marland Kitchen LDL-C is now calculated using the Martin-Hopkins  calculation, which is a validated novel method providing  better accuracy than the Friedewald equation in the  estimation of LDL-C.  Cresenciano Genre et al. Annamaria Helling. 6160;737(10): 2061-2068  (http://education.[facility].com/faq/FAQ164)    LDLDIRECT 34.0 04/26/2021   TRIG (H) 04/26/2021    815.0 Triglyceride is over 400; calculations on Lipids are invalid.   CHOLHDL 6 04/26/2021   Lab Results  Component Value Date   HGBA1C 9.7 (H) 04/26/2021   Lab Results  Component Value Date   VITAMINB12 >1550 (H) 04/26/2021    TSH 1.02   ASSESSMENT AND PLAN  75 y.o. year old male with a history of HTN, paroxysmal afib, DM who presents for evaluation of neuropathy and falls. Discussed how his falls are likely due to a combination of lightheadedness from low blood pressure and neuropathy. His neuropathy is likely secondary to uncontrolled diabetes. Will complete blood work to look for other reversible causes of neuropathy. Lightheadedness has improved, but not fully resolved since stopping amlodipine. It's possible he may also have some mild autonomic dysfunction from diabetic neuropathy contributing to his lightheadedness. Discussed that there is no cure for neuropathy and the best method of management is to control blood glucose. Discussed medication options but he would prefer to hold off at  this time. Information regarding supplements for neuropathy and fall precautions provided. Discussed importance of physical therapy for safety and fall prevention.   1. Neuropathy   2. Fall, initial encounter       PLAN: -Blood work: serum immunofixation, kappa/lambda free light chains -Continue physical therapy  Orders Placed This Encounter  Procedures   Immunofixation Electrophoresis, Serum   Kappa/lambda light chains     No orders of the defined types were placed in this encounter.   Return if symptoms worsen or fail to improve.   I spent an average of 40 minutes chart reviewing and counseling the patient, with at least 50% of the time face to face with the patient.   Genia Harold, MD 06/28/21 12:21 PM   [facility] [address] [place], [place] [address] [phone]

## 2021-06-27 NOTE — Addendum Note (Signed)
Addended by: Lamar Blinks C on: 06/27/2021 03:13 PM   Modules accepted: Orders

## 2021-06-28 ENCOUNTER — Encounter: Payer: Self-pay | Admitting: Psychiatry

## 2021-06-28 ENCOUNTER — Ambulatory Visit: Payer: Medicare HMO | Admitting: Psychiatry

## 2021-06-28 VITALS — BP 142/84 | HR 64 | Ht 69.0 in | Wt 232.0 lb

## 2021-06-28 DIAGNOSIS — G629 Polyneuropathy, unspecified: Secondary | ICD-10-CM | POA: Diagnosis not present

## 2021-06-28 DIAGNOSIS — R296 Repeated falls: Secondary | ICD-10-CM

## 2021-06-28 DIAGNOSIS — W19XXXA Unspecified fall, initial encounter: Secondary | ICD-10-CM

## 2021-06-28 NOTE — Patient Instructions (Addendum)
Supplements that can be tried for neuropathy: 1. Alpha lipoic acid 600mg  daily: Has some research data but actual dose not well established as they used IV in the clinical research trials. No major side effects other than <1% of people report upset stomach. This can be taken twice per day (1200mg  daily) if no relief obtained.  2. Acetyl-L-carnitine 1000mg  3 times daily: this has the most research data backing it with reports of diabetic, HIV and chemotherapy related neuropathy patients reporting improved symptoms. Well tolerated overall but can cause GI upset so take it with food.  3. Fish Oil (750mg  eicosapentaenoic acid, 560mg  docosapentaenoic acid, 1020mg  docosahexaenoic acid) - animal models suggest it can improve neuropathy and potentially stimulate nerve growth. It has also been shown to be beneficial in humans with type I diabetes and neuropathy.  4. Lidocaine cream - 1-2% can be applied to the feet/symptomatic areas several times daily. Wear gloves!  5. Capsaicin cream: Made of chili peppers, this cream burns and is actually rather painful when you first put it on. Mechanism of action is that it overwhelms all of the pain fibers, which theoretically lessens the pain. Wear gloves!  8. Curcumin - up to 600mg  three times daily - somewhat expensive, but can be found on [place]. Most supplements offer ~1800mg  just to be taken daily. Animal models suggest it may help with neve pain and may also help with weight loss. There are no large data human trials to support the use of this.  9. Vitamin E 10mg  twice daily  10. Vick's vapor rub      Preventing Falls at [facility] are common, often dreaded events in the lives of older people. Aside from the obvious injuries and even death that may result, fall can cause wide-ranging consequences including loss of independence, mental decline, decreased activity and mobility. Younger people are also at risk of falling, especially those with chronic  illnesses and fatigue.  Ways to reduce risk for falling   Examine diet and medications. Warm foods and alcohol dilate blood vessels, which can lead to dizziness when standing. Sleep aids, antidepressants and pain medications can also increase the likelihood of a fall.   Get a vision exam. Poor vision, cataracts and glaucoma increase the chances of falling.   Check foot gear. Shoes should fit snugly and have a sturdy, nonskid sole and a broad, low heel   Participate in a physician-approved exercise program to build and maintain muscle strength and improve balance and coordination. Programs that use ankle weights or stretch bands are excellent for muscle-strengthening. Water aerobics programs and low-impact Tai Chi programs have also been shown to improve balance and coordination.   Increase vitamin D intake. Vitamin D improves muscle strength and increases the amount of calcium the body is able to absorb and deposit in bones.  How to prevent falls from common hazards   Floors -- Remove all loose wires, cords, and throw rugs. Minimize clutter. Make sure rugs are anchored and smooth. Keep furniture in its usual place.   Chairs -- Use chairs with straight backs, armrests and firm seats. Add firm cushions to existing pieces to add height.   Bathroom -- Install grab bars and non-skid tape in the tub or shower. Use a bathtub transfer bench or a shower chair with a back support Use an elevated toilet seat and/or safety rails to assist standing from a low surface. Do not use towel racks or bathroom tissue holders to help you stand.   Lighting --  Make sure halls, stairways, and entrances are well-lit. Install a night light in your bathroom or hallway. Make sure there is a light switch at the top and bottom of the staircase. Turn lights on if you get up in the middle of the night. Make sure lamps or light switches are within reach of the bed if you have to get up during the night.   Kitchen -- Install non-skid  rubber mats near the sink and stove. Clean spills immediately. Store frequently used utensils, pots, pans between waist and eye level. This helps prevent reaching and bending. Sit when getting things out of lower cupboards.   Living room / [place] furniture with wide spaces in between, giving enough room to move around. Establish a route through the living room that gives you something to hold onto as you walk.   Stairs -- Make sure treads, rails, and rugs are secure. Install a rail on both sides of the stairs. If stairs are a threat, it might be helpful to arrange most of your activities on the lower level to reduce the number of times you must climb the stairs.   Entrances and doorways -- Install metal handles on the walls adjacent to the doorknobs of all doors to make it more secure as you travel through the doorway.   Tips for maintaining balance   Keep at least one hand free at all times. Try using a backpack or fanny pack to hold things rather than carrying them in your hands. Never carry objects in both hands when walking as this interferes with keeping your balance.   Attempt to swing both arms from front to back while walking. This might require a conscious effort if Parkinson's disease has diminished your movement. It will, however, help you to maintain balance and posture, and reduce fatigue.   Consciously lift your feet off of the ground when walking. Shuffling and dragging of the feet is a common culprit in losing your balance.   When trying to navigate turns, use a "U" technique of facing forward and making a wide turn, rather than pivoting sharply.   Try to stand with your feet shoulder-length apart. When your feet are close together for any length of time, you increase your risk of losing your balance and falling.   Do one thing at a time. Don't try to walk and accomplish another task, such as reading or looking around. The decrease in your automatic reflexes complicates motor  function, so the less distraction, the better.   Do not wear rubber or gripping soled shoes, they might "catch" on the floor and cause tripping.   Move slowly when changing positions. Use deliberate, concentrated movements and, if needed, use a grab bar or walking aid. Count 15 seconds between each movement. For example, when rising from a seated position, wait 15 seconds after standing to begin walking.   If balance is a continuous problem, you might want to consider a walking aid such as a cane, walking stick, or walker. Once you've mastered walking with help, you might be ready to try it on your own again.

## 2021-07-04 ENCOUNTER — Telehealth: Payer: Self-pay

## 2021-07-04 LAB — KAPPA/LAMBDA LIGHT CHAINS
Ig Kappa Free Light Chain: 55.6 mg/L — ABNORMAL HIGH (ref 3.3–19.4)
Ig Lambda Free Light Chain: 34.1 mg/L — ABNORMAL HIGH (ref 5.7–26.3)
KAPPA/LAMBDA RATIO: 1.63 (ref 0.26–1.65)

## 2021-07-04 LAB — IMMUNOFIXATION ELECTROPHORESIS
IgA/Immunoglobulin A, Serum: 235 mg/dL (ref 61–437)
IgG (Immunoglobin G), Serum: 1456 mg/dL (ref 603–1613)
IgM (Immunoglobulin M), Srm: 59 mg/dL (ref 15–143)
Total Protein: 7.9 g/dL (ref 6.0–8.5)

## 2021-07-04 NOTE — Telephone Encounter (Signed)
-----   Message from Genia Harold, MD sent at 07/04/2021 12:34 PM EDT ----- Labs show an elevation in antibody proteins which can be seen with inflammation or chronic kidney disease. 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 There is no need for further workup for his neuropathy at this time. He can let me know if he becomes interested in trying medications for his symptoms.

## 2021-07-04 NOTE — Telephone Encounter (Signed)
Contacted pt regarding labs, LVM rq call back

## 2021-07-17 DIAGNOSIS — I10 Essential (primary) hypertension: Secondary | ICD-10-CM

## 2021-07-17 DIAGNOSIS — E1165 Type 2 diabetes mellitus with hyperglycemia: Secondary | ICD-10-CM | POA: Diagnosis not present

## 2021-07-17 DIAGNOSIS — E785 Hyperlipidemia, unspecified: Secondary | ICD-10-CM | POA: Diagnosis not present

## 2021-07-26 ENCOUNTER — Ambulatory Visit (INDEPENDENT_AMBULATORY_CARE_PROVIDER_SITE_OTHER): Payer: Medicare HMO | Admitting: Pharmacist

## 2021-07-26 DIAGNOSIS — E785 Hyperlipidemia, unspecified: Secondary | ICD-10-CM

## 2021-07-26 DIAGNOSIS — E1165 Type 2 diabetes mellitus with hyperglycemia: Secondary | ICD-10-CM

## 2021-07-26 DIAGNOSIS — I10 Essential (primary) hypertension: Secondary | ICD-10-CM

## 2021-07-28 NOTE — Patient Instructions (Signed)
Visit Information  Hypertension BP Readings from Last 3 Encounters:  06/28/21 (!) 142/84  04/26/21 124/80  07/06/20 136/84   Pharmacist Clinical Goal(s): Over the next 90 days, patient will work with PharmD and providers to maintain BP goal <140/90 while minimizing side effects / dizziness and falls. Current regimen:  Losartan 100 - take 1 tablet daily Interventions: Discussed blood pressure goal Discussed diet and exercise Recommended patient check blood pressure 1 to 3 times per week, document, and provide at future appointments Patient states benazepril on own - recommended he could take 40mg  twice a day but not both benazepril and losartan as they are similar medications. Also discuss other options like low dose hydrochlorothiazide to help with blood pressure  Patient self care activities - Over the next 90 days, patient will: Check blood pressure 1 to 3 times per week, document, and provide at future appointments Ensure daily salt intake < 2300 mg/day Hold losartan Take benazepril 40mg  twice a day   Hyperlipidemia with elevated triglycerides Lipid Panel     Component Value Date/Time   CHOL 145 04/26/2021 1201   TRIG (H) 04/26/2021 1201    815.0 Triglyceride is over 400; calculations on Lipids are invalid.   HDL 25.00 (L) 04/26/2021 1201   CHOLHDL 6 04/26/2021 1201   LDLDIRECT 34.0 04/26/2021 1201    Pharmacist Clinical Goal(s): Over the next 90 days, patient will work with PharmD and providers to achieve LDL goal < 100 and Triglycerides <150 Current regimen:  Aspirin 81mg  daily Niaspan CR 1000mg  at bedtime (lowers triglycerides and can increase HDL) Salmon Oil 1000mg  twice daily per [place] (lowers triglycerides and can increase HDL) Praluent 75mg  every 14 days (lowers LDL mostly be also can lower triglycerides) Interventions: Discussed lipid goals and risk associated with elevated triglycerides Reviewed foods that can increase triglycerides; continue to limit intake of  alcohol, sweets / sugar, potatoes, bread, rice and pasta Reviewed [place] coverage of Praluent. Would require prior authorization by [facility] primary care provider. Praluent does have medication assistance program. Mailed application for patient assistance for Praluent in September 2022 Patient self care activities - Over the next 90 days, patient will: Maintain cholesterol medication regimen.  Limit intake of foods that can increase triglycerides Encouraged increased physical activity Complete application for Praluent medication assistance and return to our office  Diabetes Lab Results  Component Value Date/Time   HGBA1C 9.7 (H) 04/26/2021 12:01 PM   HGBA1C 7.8 (H) 07/06/2020 02:48 PM   HGBA1C 8.2 08/12/2018 12:00 AM  A1c at Dr Theodosia Blender office was 10.6% on 02/10/2021  Pharmacist Clinical Goal(s): Over the next 90 days, patient will work with PharmD and providers to achieve A1c goal <7% Current regimen:  Ozempic - inject 1.5mg  weekly Metformin XR 500mg  - take 2 tablets = 1000mg  twice a day with morning and evening meals Interventions: Discussed A1c and blood glucose goals Reviewed home blood glucose readings and reviewed goals  Fasting blood glucose goal (before meals) = 80 to 130 Blood glucose goal after a meal = less than 180  Patient self care activities - Over the next 90 days, patient will: Check blood sugar once daily, document, and provide at future appointments Limit intake of high carbohyrates foods and sugar - continue with low carbohydrate diet Continue to increase physical activity - goal is to work up to at least 150 minutes per week.  Contact provider with any episodes of hypoglycemia or blood glucose over 250 or less than 80  Prostate Health /  elevated PSA;  Interventions: Follow up with urology clinic Patient self care activities - Over the next 30 days, patient will: Continue to follow up with urology clinic   Osteoarthritis Pharmacist Clinical Goal(s) Over the next 90  days, patient will work with PharmD and providers to reduce symptoms associated with arthritis Current regimen:  Alpha lipoic acid 600mg  daily  Diclofenac 75mg  up to twice a day if needed for pain Patient self care activities - Over the next 90 days, patient will: Maintain medication regimen for arthritis    GERD Pharmacist Clinical Goal(s) Over the next 90 days, patient will work with PharmD and providers to reduce symptoms associated with GERD Current regimen:  Famotidine 20mg  twice daily Omeprazole 40mg  daily at bedtime Patient self care activities - Over the next 90 days, patient will: Take omeprazole in morning on empty stomach and take famotidine at bedtime  Health Maintenance:  Reviewed vaccination history and discussed benefits of annual flu vaccine and COVID Booster Patient to get the following vaccines at next provider appointment, local pharmacy or [place]: flu vaccine and COVID booster  Medication management Pharmacist Clinical Goal(s): Over the next 90 days, patient will work with PharmD and providers to maintain optimal medication adherence Current pharmacy: [facility] Interventions Comprehensive medication review performed. Continue current medication management strategy Verified patient has reduced vitamin B12 supplement to every other day as recommended by Dr Lorelei Pont after last labs. Patient self care activities - Over the next 90 days, patient will: Focus on medication adherence by filling and taking medications appropriately  Take medications as prescribed Report any questions or concerns to PharmD and/or provider(s)   Patient Goals/Self-Care Activities Over the next 180 days, patient will: take medications as prescribed check glucose daily , document, and provide at future appointments,  check blood pressure 1 to 3 times per week, document, and provide at future appointments  Follow Up Plan: Telephone  follow up appointment with care management team member scheduled for:  4 to 6 weeks; Will see Dr Lorelei Pont in 1 to 2 weeks for follow   Patient verbalizes understanding of instructions provided today and agrees to view in [place].   Telephone follow up appointment with care management team member scheduled for: 4 to 6 weeks; Sees Dr Lorelei Pont in 1 to 2 weeks.   Cherre Robins, PharmD Clinical Pharmacist [facility]

## 2021-07-28 NOTE — Chronic Care Management (AMB) (Signed)
Chronic Care Management Pharmacy Note  07/28/2021 Name:  Timothy Chapman MRN:  [id number] DOB:  [birth year]  Summary: A1c improving but still above goal. He has been able to increase Ozempic a little and is taking 1.49m weekly (was unable to tolerate 276mdaily).    Following low CHO diet with weight loss of about 30 lbs over the last 6 months.  In Medicare coverage gap - cannot afford Praluent - as not taken in 2 months. Application for assistance sent last month but patient has not completed.  At last visit we changed losartan hydrochlorothiazide 100-2516maily to losartan 100m46mily due to patient's concerns with hydrochlorothiazide side effects. He reports blood pressure has been above goal sine this change. Patient started benazepril 40mg30mly on his own. Tried to get patient to restart losartan hydrochlorothiazide but he refused.    Recommendations/Changes made from today's visit: Continue low CHO diet and weight loss.  Encouraged patient to complete application for patient assistance for Praluent Discussed options for lowering blood pressure like adding hydrochlorothiazide 12.5mg b28m to losartan. Patient declined. Since he has benazepril on hand - recommended trial of taking benazepril 40mg t68m a day (he will hold losartan 100mg as21mse 2 meds are similar) .  Follow up with PCP in 1 to 2 weeks.   Subjective: Timothy WJADAN HINOJOS5 y.o. 75ar old male who is a primary patient of Copland, Jessica Gay Fillerhe CCM team was consulted for assistance with disease management and care coordination needs.    Engaged with patient by telephone for follow up visit in response to provider referral for pharmacy case management and/or care coordination services.   Consent to Services:  The patient was given information about Chronic Care Management services, agreed to services, and gave verbal consent prior to initiation of services.  Please see initial visit note for detailed documentation.    Patient Care Team: Copland, Jessica Gay FillerPCP - General (Family Medicine) Adithi Gammon, Cherre RobinsP (Pharmacist)  Recent office visits: 04/26/21 Jessica Lamar Blinksrtigo/dizziness - labs ordered - referred to community neurology for evaluation. Patient advised after labs returned to restart Praluent and decreaes B12 supplement to every OTHER day 03/29/2021 - phone call - reported metallic taste with Paxlovid; recommended he stop 03/28/2021 - Dr [place] TJoyce Gross positive on 03/24/2021. Prescribed Paxlovid 300mg bid36m 5 days and benzonatate 200mg up t5md prn for 14 days.  03/02/2021 - Dr [place] TurJoyce Gross Type 2 DM and HTN; no med changes.  02/10/2021 - [place], FPN - elevated BG; changed Ozempic to Rybelsus 14mg daily66m02/2022 - Dr Turner - [facility] care - stopped amlodipine and benazepril; started losartan HCTZ 100/25mg daily;36mpped glipizide  Recent consult visits: 07/13/2021 - radiology ([facility], FNP) Ronal Fearf prostate. H/O prostate cancer. No convincing MR evidence of a clinically significant focal prostate cancer. Attention on followup as indicated.   06/28/2021 - Neurology (Dr Chima) evaluBilley Gosling of neuropathy and falls.He has tried multiple treatments for neuropathy previously and is not interested in trying any more medications. Checked labs and recommended continue physical therapy.  06/21/2021 - Urology ([facility]) f/u prostate cancer and his elevated serum PSA value. I will repeat PSA today. Ordered repeat MRI. Also discussed repeat prostate biopsy given his history of prostate cancer.  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 Good response to Eylea. Received #13 OVE of left eyee at this visit.  F/U in 8 weeks for reevluation and IVE. 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 Stressed better BG control with goal of A1c < 7.0. Monitor cataract of both eyes.  02/20/2021 - [place] [facility] - Elevated  PSA / malignant neoplasm of prostate. No med changes - has follow up 04/06/2021 02/14/2021 - Ophthalmology  - Dr Manuella Ghazi for diabetic eye exam and injection of [facility] visits: None in previous 6 months  Objective:  Lab Results  Component Value Date   CREATININE 1.44 04/26/2021   CREATININE 1.20 (H) 07/06/2020   CREATININE 1.22 05/28/2019    Lab Results  Component Value Date   HGBA1C 9.7 (H) 04/26/2021   Last diabetic Eye exam: No results found for: HMDIABEYEEXA  Last diabetic Foot exam: No results found for: HMDIABFOOTEX      Component Value Date/Time   CHOL 145 04/26/2021 1201   TRIG (H) 04/26/2021 1201    815.0 Triglyceride is over 400; calculations on Lipids are invalid.   HDL 25.00 (L) 04/26/2021 1201   CHOLHDL 6 04/26/2021 1201   VLDL 57.0 (H) 07/09/2017 0826   LDLCALC  07/06/2020 1448     Comment:     . LDL cholesterol not calculated. Triglyceride levels greater than 400 mg/dL invalidate calculated LDL results. . Reference range: <100 . Desirable range <100 mg/dL for primary prevention;   <70 mg/dL for patients with CHD or diabetic patients  with > or = 2 CHD risk factors. Marland Kitchen LDL-C is now calculated using the Martin-Hopkins  calculation, which is a validated novel method providing  better accuracy than the Friedewald equation in the  estimation of LDL-C.  Cresenciano Genre et al. Annamaria Helling. 5573;220(25): 2061-2068  (http://education.[employer].com/faq/FAQ164)    LDLDIRECT 34.0 04/26/2021 1201    Hepatic Function Latest Ref Rng & Units 06/28/2021 04/26/2021 07/06/2020  Total Protein 6.0 - 8.5 g/dL 7.9 7.5 7.8  Albumin 3.5 - 5.2 g/dL - 4.2 -  AST 0 - 37 U/L - 32 35  ALT 0 - 53 U/L - 33 70(H)  Alk Phosphatase 39 - 117 U/L - 48 -  Total Bilirubin 0.2 - 1.2 mg/dL - 0.4 0.4  Bilirubin, Direct 0.1 - 0.5 mg/dL - - -    No results found for: TSH, FREET4  CBC Latest Ref Rng & Units 04/26/2021 07/06/2020 05/28/2019  WBC 4.0 - 10.5 K/uL 6.9 8.9 7.2   Hemoglobin 13.0 - 17.0 g/dL 12.1(L) 13.3 12.2(L)  Hematocrit 39.0 - 52.0 % 36.5(L) 40.7 37.5(L)  Platelets 150.0 - 400.0 K/uL 232.0 247 276.0    No results found for: VD25OH  Clinical ASCVD: Yes  The 10-year ASCVD risk score (Arnett DK, et al., 2019) is: 59%   Values used to calculate the score:     Age: 32 years     Sex: Male     Is Non-Hispanic African American: No     Diabetic: Yes     Tobacco smoker: Yes     Systolic Blood Pressure: 427 mmHg     Is BP treated: Yes     HDL Cholesterol: 25 mg/dL     Total Cholesterol: 145 mg/dL    Social History   Tobacco Use  Smoking Status Former   Types: Cigarettes   Quit date: 09/18/1975   Years since quitting: 45.8  Smokeless Tobacco Never   BP Readings from Last 3 Encounters:  06/28/21 (!) 142/84  04/26/21 124/80  07/06/20 136/84   Pulse Readings from Last 3 Encounters:  06/28/21 64  04/26/21 87  07/06/20 86   Wt Readings from Last 3 Encounters:  06/28/21 232 lb (105.2 kg)  04/26/21 236 lb (107 kg)  02/22/21 245 lb (111.1 kg)    Assessment: Review of patient past medical history, allergies, medications, health status, including review of consultants reports, laboratory and other test data, was performed as part of comprehensive evaluation and provision of chronic care management services.   SDOH:  (Social Determinants of Health) assessments and interventions performed:      CCM Care Plan  Allergies  Allergen Reactions   Codeine     hallucinations   Farxiga [Dapagliflozin] Other (See Comments)    Genital infection and irritation   Statins     Has tried several cannot tolerate    Medications Reviewed Today     Reviewed by Cherre Robins, RPH-CPP (Pharmacist) on 07/28/21 at 1159  Med List Status: <None>   Medication Order Taking? Sig Documenting Provider Last Dose Status Informant  Acetylcarnitine HCl 500 MG CAPS [id number] Yes Take 500 mg by mouth daily. [provider] Taking Active Self            Med Note Rosemarie Beath, MELISSA B   Thu Nov 21, 2017 12:03 PM)    Alcohol Swabs (B-D SINGLE USE SWABS REGULAR) PADS [id number] Yes USE PRIOR TO INSULIN DOSE Copland, Gay Filler, MD Taking Active   Alirocumab (PRALUENT) 75 MG/ML Darden Palmer [id number] Yes INJECT 75MG INTO THE SKIN EVERY 14 DAYS Copland, Gay Filler, MD Taking Active   Alpha-Lipoic Acid 600 MG CAPS [id number] Yes Take 600 mg by mouth daily. [provider] Taking Active Self           Med Note Antony Contras, Sura Canul B   Tue Apr 04, 2021  1:16 PM)    aspirin 81 MG chewable tablet [id number] Yes Chew 1 tablet (81 mg total) by mouth daily. Shelly Coss, MD Taking Active Self  diclofenac (VOLTAREN) 75 MG EC tablet [id number] Yes TAKE 1 TABLET TWICE A DAY  AS NEEDED FOR JOINT PAINS  Patient taking differently: TAKE 1 TABLET TWICE A DAY  AS NEEDED FOR JOINT PAINS   Copland, Gay Filler, MD Taking Active            Med Note Lynita Lombard Sep 29, 2020  3:10 PM) Recommended not to take by [facility]  famotidine (PEPCID) 20 MG tablet [id number] Yes Take 1 tablet (20 mg total) by mouth 2 (two) times daily. Copland, Gay Filler, MD Taking Active            Med Note Theodosia Blender Apr 04, 2021  1:18 PM) Taking as needed  Lancets (ONETOUCH DELICA PLUS [id number]) MISC [id number] Yes USE TWO TIMES A DAY TO     CHECK BLOOD SUGAR Copland, Gay Filler, MD Taking Active   losartan (COZAAR) 100 MG tablet [id number] Yes Take 1 tablet (100 mg total) by mouth daily. Copland, Gay Filler, MD Taking Active   melatonin 5 MG TABS [id number] Yes Take 5 mg by mouth at bedtime.  [provider] Taking Active Self  metFORMIN (GLUCOPHAGE-XR) 500 MG 24 hr tablet [id number] Yes Take 2 tablets (1,000 mg total) by mouth 2 (two) times daily after a meal. Take with food Copland, Gay Filler, MD Taking Active   Multiple Vitamin (MULTIVITAMIN) tablet [id number] Yes Take 1 tablet by mouth daily. [provider] Taking Active Self  niacin (NIASPAN) 1000 MG CR tablet [id number] Yes Take  1,000 mg by mouth at bedtime.  [provider] Taking Active   omeprazole (PRILOSEC) 40 MG capsule [id number] Yes TAKE 1 CAPSULE DAILY Copland, Gay Filler, MD Taking Active   [facility] VERIO test strip [id number] Yes USE AS INSTRUCTED Copland, Gay Filler, MD Taking Active   OVER THE COUNTER MEDICATION [id number] Yes Singing nettle daily for prostate [provider] Taking Active   Semaglutide, 2 MG/DOSE, (OZEMPIC, 2 MG/DOSE,) 8 MG/3ML SOPN [id number] Yes Inject 1.5 mg into the skin once a week. [provider] Taking Active   tadalafil (CIALIS) 10 MG tablet [id number] Yes Take 1 tablet (10 mg total) by mouth daily as needed for erectile dysfunction. Max 20 mg total per day Copland, Gay Filler, MD Taking Active   tadalafil (CIALIS) 5 MG tablet [id number] Yes Take 1 tablet (5 mg total) by mouth daily. Copland, Gay Filler, MD Taking Active   vitamin C (ASCORBIC ACID) 500 MG tablet [id number] Yes Take 500 mg by mouth daily. 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   Paroxysmal atrial fibrillation ([place]) 12/16/2017   SBO (small bowel obstruction) ([place]) 10/08/2017   ATN (acute tubular necrosis) ([place]) 10/08/2017   Septic shock ([place]) 10/04/2017   Acute cholecystitis s/p perc cholecystomy drainage 10/04/2017    Acute respiratory failure with hypoxia ([facility])    Chronic back pain 02/08/2016   Controlled type 2 diabetes mellitus with diabetic neuropathy, without long-term current use of insulin ([place]) 02/08/2016   Insomnia 02/08/2016   History of diverticulitis 02/08/2016   HYPERTENSION, BENIGN ESSENTIAL, UNCONTROLLED 03/12/2009   ABNORMAL ELECTROCARDIOGRAM 03/12/2009    Immunization History  Administered Date(s) Administered   Fluad Quad(high Dose 65+) 05/28/2019, 07/06/2020   Influenza, High Dose Seasonal PF 06/20/2016, 07/08/2017   PFIZER(Purple Top)SARS-COV-2 Vaccination  11/15/2019, 12/15/2019   Pneumococcal Conjugate-13 06/20/2016   Pneumococcal Polysaccharide-23 05/28/2019   Zoster Recombinat (Shingrix) 02/19/2020, 05/03/2020    Conditions to be addressed/monitored: HTN, HLD, DMII, and arthritis / pain; GERD, BPH / ED;   Care Plan : General Pharmacy (Adult)  Updates made by Cherre Robins, RPH-CPP since 07/28/2021 12:00 AM     Problem: Chronic Disease Management support, education, and care coordination needs related to HTN, Hyperlipidemia, Diabetes, Insomnia, GERD, Pain, BPH/ED   Note:   Current Barriers:  Unable to maintain control of type 2 DM Does not notify physician's office if has questions or concerns In need of Chronic Disease Management support, education, and care coordination related to Hypertension, Hyperlipidemia, Diabetes, GERD (with Barrett's Esophagus); NASH; Pain, BPH/ED History of low adherence to maintenance medications Unable to afford therapy for hyperlipidemia (Praluent)   Pharmacist Clinical Goal(s):  Over the next 180 days, patient will verbalize ability to afford treatment regimen achieve adherence to monitoring guidelines and medication adherence to achieve therapeutic efficacy achieve control of diabetes and hypertension as evidenced by A1c < 7.0% and BP <140/90 adhere to prescribed medication regimen as evidenced by refill history and meeting of goal mentioned below  through collaboration with PharmD and provider.  Better understand medication regimen and improve adherence to medication therapy Maintain communication with PCP's office   Interventions: 1:1 collaboration with Copland, Gay Filler, MD regarding development and update of comprehensive plan of care as evidenced by provider attestation and co-signature Inter-disciplinary care team collaboration (see longitudinal plan of care) Comprehensive medication review performed; medication list updated in electronic medical record  Diabetes: Uncontrolled - A1c goal  <7.0%  A1c is not at goal but improved from 10.6% 02/10/2021 to 9.7% 04/26/2021 Current regimen:  Ozempic - inject 1.18m weekly Metformin XR 5055m- take 2 tablets = 100073mwice a day with morning and evening meals Was unable to tolerate FarIrancaused irritation and infection of genitals; Rybelsus - cost and more nasuea than Ozempic Also was unable to tolerated higher Ozempic dose 2mg84mekly - caused nausea so he restarted 1mg 31me. Today he reports that he has increased dose to 1.5mg w61mly and has been tolerating well.  Had one episode of nausea Patient reports recent home BG: 129 today; ranging from 107 to 180 [facility] was high normal at 4.06 Denies BG <80 / hypoglycemic symptoms [facility] 07/06/2020 was 253; weight 04/26/2021 was 236 (weight loss of 23 lbs). Per patient has lost about 30lbs. In last 8 to 10 months.  Reviewed diet - patient states he has been following low carb/ keto type diet with intermittent fasting. Patient denies eating sugar or drinking juices or sodas; tries to limit CHO intake; Has lost 30 lbs in the last 6 months Exercise: using treadmill a few times per week Interventions: Discussed A1c and blood glucose goals Reviewed home blood glucose readings and reviewed goals  Fasting blood glucose goal (before meals) = 80 to 130 Blood glucose goal after a meal = less than 180  Continue with current medication. If A1c still > 9.0 at next visit, consider long acting insulin addition Continue to follow low carbohydrate diet and exercise at least 150 minutes per week.   Hypertension: BP Readings from Last 3 Encounters:  06/28/21 (!) 142/84  04/26/21 124/80  07/06/20 136/84  Slightly elevated at most recent office visit Home blood pressure has been in 140's to 150's / 70-80's. Patient reports blood pressure was elevated in daytime (taking losartan at night). He started benazepril 40mg d33m on his  own.  Goal BP <140/90 Home BP readings have been 130-140 / 70-80. Blood pressure at home today was 136/80  Current regimen:  Losartan 100mg - 77m 1 tablet daily Benazepril 40mg dai34mpatient started this back on his own.  Previous medications tried: benazepril - switched to losartan; amlodipine - stopped due to dizziness; losartan hydrochlorothiazide - patient concerned for hydrochlorothiazide side effects and did not feel that he needed it because his "ankles are skinny and shriveled up" Patient reports he was falling a lot when he was taking amlodipine; Dr [place] TuJoyce Grossium WaJulianamlodipine in May and patient reports he is no longer falling but still has some dizziness.  He saw neurology for dizziness / neuropathy 06/2021. No cause of dizziness noted. No further follow up recommended unless patient would like to pursue treatment of neuropathy.  Interventions: Recommended patient check blood pressure 1 to 3 times per week, document, and provide at future appointments  Discussed that losartan and benazepril are similar medications and adding benazepril to losartan would not likely add much to lower blood pressure.  Recommended trial of benazepil 40mg twic74mday (hold losartan) for 2 weeks to see if blood pressure improves.  Discussed other options like adding back a little hydrochlorothiazide (12.5mg) daily42mt patient does not want to take hydrochlorothiazide.  Discussed blood pressure goal Discussed diet and exercise Ensure daily salt intake < 2300 mg/day  Hyperlipidemia with elevated triglycerides LDL at goal but Tg very elevated; LDL goal < 100 and Triglycerides <150 Current  regimen:  Niaspan CR 1074m at bedtime - take aspirin 839mprior to niaspan to decrease [place] [employer] [employer] 100059mwice daily [place] 27[place]39[place] 14 days (has not taken in 2 months due to cost) Patient has VA benefits - contacted VA to see if Praluent is on formulary. Unfortunately  [place]. Patient states he will discuss with his VA PRedmon.  I did send application for Praluent medication assistance but patient has not completed yet.  Patient denied alcohol use Interventions: Discussed lipid goals and risk associated with elevated trilycerides Reviewed foods that can increase triglycerides; encouraged patient to continue to limit intake of alcohol, sweets / sugar, potatoes, bread, rice and pasta Encouraged increased physical activity Patient will discuss Praluent with his [facility] primary care provider.  Reminded patient to complete application for medication assistance for Praluent.   Chronic Back Pain / Osteoarthritis Controlled Current regimen:  Alpha lipoic acid 600mg37mly  Diclofenac 27mg 61mo twice a day if needed for pain Patient self care activities - Over the next 90 days, patient will: Maintain medication regimen for arthritis    GERD Controlled Current regimen:  Famotidine 20mg t7m daily Omeprazole 40mg da27mat bedtime Patient self care activities - Over the next 90 days, patient will: Take omeprazole in morning on empty stomach and take famotidine at bedtime  Insomnia:  Current Regimen:  Melatonin 5mg qhs 43mient reports he is sleeping fairly well at night.  He does have a new 10 month 60d at home and some nights he is able to sleep better that other.  Interventions: none Continue current regimen for sleep  BPH with elevated PSA; Patient has sen urologist regarding elevated; Had MRI of prostate 07/13/2021 - radiology ([facility], FNRonal Fearted no evidence of a clinically significant focal prostate cancer. Attention on followup as indicated.  [facility]  Health Maintenance:  Reviewed vaccination history and discussed benefits of annual flu vaccine and COVID Booster Patient to get the following vaccines at next provider appointment, local pharmacy or [facility] and COVID booster Due to see PCP. Appointment made for November 2022 to see PCP  Medication management [facility] Interventions Comprehensive medication review performed. Medication list updated Reviewed refill history and discussed adherence with patient. Continue current medication management strategy    Patient Goals/Self-Care Activities Over the next 180 days, patient will:  take medications as prescribed, check glucose daily , document, and provide at future appointments, check blood pressure 1 to 3 times per week, document, and provide at future appointments, and collaborate with provider on medication access solutions  Follow Up Plan: Telephone follow up appointment with care management team member scheduled for:  4 to 6 weeks              Medication Assistance:  Mailed application for Praluent patient assistance since he is in Medicare coverage gap but patient will also check with VA primary care provider to see if they will do PA to get thru [facility].   [place] mailed in August 2022 - has not completed. Encouraged patient to complete and return to office ASAP.   [facility]  [facility] [address] [phone] [place] [place]  [address] Phone: [phone] Fax: [phone]  [facility] [place] [place] [facility] [address] [place] [address] Phone: [phone] Fax: [place] [phone] - [place], [place] [address] [place] [address] Phone: [phone] Fax: [phone]  [id number] [place] [place] - [id number] [place]: [phone]: [phone]  Follow Up:  Patient agrees to Care Plan and Follow-up.  Plan: Follow up with patient in 4 to 6 weeks Will see PCP in 1 to 2 weeks.  TCherre Robins PharmD Clinical Pharmacist [facility]

## 2021-08-09 ENCOUNTER — Other Ambulatory Visit: Payer: Self-pay | Admitting: Family Medicine

## 2021-08-09 DIAGNOSIS — K219 Gastro-esophageal reflux disease without esophagitis: Secondary | ICD-10-CM

## 2021-08-09 NOTE — Progress Notes (Addendum)
[facility] [address], [place], [place] [address] [phone]  Date:  08/16/2021   Name:  Timothy Chapman [facility]   DOB:  Aug 08, 1946   MRN:  [id number]  PCP:  Timothy Mclean, MD    Chief Complaint: Follow up HTN and DM (Concerns/ questions: none besides needing refills/Flu shot: received last Saturday)   History of Present Illness:  Timothy Chapman is a 75 y.o. very pleasant male patient who presents with the following:  Pt seen today for follow-up Last seen by myself in August  History of diabetes, hypertension, chronic back pain, severe illness in 2019-he became ill with sepsis due to cholangitis, had acute respiratory failure and required intubation He receives some care through the [place]   His wife Timothy Chapman (who is significantly younger) gave birth to their first child together- a son Timothy Chapman who is now a year old (he does have an older daughter from a previous marriage)   Lab Results  Component Value Date   HGBA1C 9.7 (H) 04/26/2021   From most recent labs:  Blood counts show minimal anemia. However, looking back this is not unusual for you and may be just personal variation Metabolic profile is reasonable- you kidney function (GFR) has taken a slight dip, but nothing alarming.  We will continue to monitor closely A1c is high, but it sounds like improved from last check at the [place]? I am not quite sure about the status of your diabetes treatment- please follow-up with your doc at the [place] about this.   Your cholesterol is not good- let's get you back on the Praluent.  It is hard for me to say exactly how it looked when you were taking this medication since you did run out a month ago   Diabetic eye exam- regular per DR Timothy Chapman  Foot exam needs to be updated COVID-19 booster- he plans to do asap  Flu shot- done already   He notes a pain in his left side/ left abd which he notes most if his son is climbing on his belly He  notes his pain has been present for about a year.  It is not particularly changing-so far no imaging has been done He is using ozempic and does have some nausea and vomiting with this medication Vomiting once a week on average  He is ok with this SE but does not want to try increasing the Ozempic dosage Diarrhea is better with extended release metformin   His benazepril was recently increased to 40 twice daily by our pharmacist.  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   Paroxysmal atrial fibrillation ([place]) 12/16/2017   SBO (small bowel obstruction) ([place]) 10/08/2017   ATN (acute tubular necrosis) ([place]) 10/08/2017   Septic shock ([place]) 10/04/2017   Acute cholecystitis s/p perc cholecystomy drainage 10/04/2017    Acute respiratory failure with hypoxia (HCC)    Chronic back pain 02/08/2016   Controlled type 2 diabetes mellitus with diabetic neuropathy, without long-term current use of insulin ([place]) 02/08/2016   Insomnia 02/08/2016   History of diverticulitis 02/08/2016   HYPERTENSION, BENIGN ESSENTIAL, UNCONTROLLED 03/12/2009   ABNORMAL ELECTROCARDIOGRAM 03/12/2009    Past Medical History:  Diagnosis Date   AKI (acute kidney injury) ([place])    Anxiety    Arthritis    Barrett esophagus    Depression    Diabetes mellitus without complication ([place])    type 2   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   Cannot tolerate statins   GERD (gastroesophageal reflux disease)    Hypertension    Neuromuscular disorder (HCC)    neuropathy but being treated   PONV (postoperative nausea and vomiting)    Sleep apnea    has a sleep study but refuses that he has sleep study, not wearing CPAP    Past Surgical History:  Procedure Laterality Date   BACK SURGERY     X2 - lower    CHOLECYSTECTOMY N/A 12/20/2017   Procedure: LAPAROSCOPIC CHOLECYSTECTOMY WITH INTRAOPERATIVE CHOLANGIOGRAM POSSIBLE OPEN;  Surgeon: Fanny Skates, MD;  Location: [facility];  Service: General;  Laterality: N/A;   COLONOSCOPY     ESOPHAGOGASTRODUODENOSCOPY     IR RADIOLOGIST EVAL & MGMT  10/30/2017   IR RADIOLOGIST EVAL & MGMT  11/14/2017   PROSTATE SURGERY  2017   uro lift per pt.   REPLACEMENT TOTAL KNEE Left    ROTATOR CUFF REPAIR     Bil    Social History   Tobacco Use   Smoking status: Former    Types: Cigarettes    Quit date: 09/18/1975    Years since quitting: 45.9   Smokeless tobacco: Never  Vaping Use   Vaping Use: Never used  Substance Use Topics   Alcohol use: Not Currently    Alcohol/week: 0.0 standard drinks    Comment: Rarely   Drug use: Never    Family History  Problem Relation Age of Onset   Heart failure Father    Heart disease Father    Emphysema Mother    Colon cancer Maternal Grandmother     Allergies  Allergen Reactions   Codeine     hallucinations   Farxiga [Dapagliflozin] Other (See Comments)    Genital infection and irritation   Statins     Has tried several cannot tolerate    Medication list has been reviewed and updated.  Current Outpatient Medications on File Prior to Visit  Medication Sig Dispense Refill   Acetylcarnitine HCl 500 MG CAPS Take 500 mg by mouth daily.     Alcohol Swabs (B-D SINGLE USE SWABS REGULAR) PADS USE PRIOR TO INSULIN DOSE 200 each 7   Alirocumab (PRALUENT) 75 MG/ML SOAJ INJECT 75MG  INTO THE SKIN EVERY 14 DAYS 2 mL 11   Alpha-Lipoic Acid 600 MG CAPS Take 600 mg by mouth daily.     aspirin 81 MG chewable tablet Chew 1 tablet (81 mg total) by mouth daily. 30 tablet 0   famotidine (PEPCID) 20 MG tablet Take 1 tablet (20 mg total) by mouth 2 (two) times daily. 180 tablet 3   Lancets (ONETOUCH DELICA PLUS [id number]) MISC USE TWO TIMES A DAY TO     CHECK BLOOD SUGAR 100 each 3   melatonin 5 MG TABS Take 5 mg by mouth at  bedtime.      metFORMIN (GLUCOPHAGE-XR) 500 MG 24 hr tablet Take 2 tablets (1,000 mg total) by mouth 2 (two) times daily after a meal. Take with food 360 tablet 3   Multiple Vitamin (MULTIVITAMIN) tablet Take 1 tablet by mouth daily.     niacin (NIASPAN) 1000 MG CR tablet Take 1,000 mg by mouth at bedtime.     ONETOUCH VERIO test strip USE AS  INSTRUCTED 100 strip 7   OVER THE COUNTER MEDICATION Singing nettle daily for prostate     Semaglutide, 2 MG/DOSE, (OZEMPIC, 2 MG/DOSE,) 8 MG/3ML SOPN Inject 1.5 mg into the skin once a week.     tadalafil (CIALIS) 10 MG tablet Take 1 tablet (10 mg total) by mouth daily as needed for erectile dysfunction. Max 20 mg total per day 90 tablet 2   tadalafil (CIALIS) 5 MG tablet Take 1 tablet (5 mg total) by mouth daily. 90 tablet 2   vitamin C (ASCORBIC ACID) 500 MG tablet Take 500 mg by mouth daily.     No current facility-administered medications on file prior to visit.    Review of Systems:  As per HPI- otherwise negative.   Physical Examination: Vitals:   08/16/21 1035  BP: 130/84  Pulse: 68  Resp: 18  Temp: 98.6 F (37 C)  SpO2: 99%   Vitals:   08/16/21 1035  Weight: 237 lb 12.8 oz (107.9 kg)  Height: 5\' 9"  (1.753 m)   Body mass index is 35.12 kg/m. Ideal Body Weight: Weight in (lb) to have BMI = 25: 168.9  GEN: no acute distress.  Obese, otherwise looks well and his typical self HEENT: Atraumatic, Normocephalic.  Ears and Nose: No external deformity. CV: RRR, No M/G/R. No JVD. No thrill. No extra heart sounds. PULM: CTA B, no wheezes, crackles, rhonchi. No retractions. No resp. distress. No accessory muscle use. ABD: S, ND, +BS. No rebound. No HSM.  Ventral hernia is present.  Tenderness to palpation in the left mid abdomen  EXTR: No c/c/e PSYCH: Normally interactive. Conversant.  Foot exam:  see notes  BP Readings from Last 3 Encounters:  08/16/21 130/84  06/28/21 (!) 142/84  04/26/21 124/80    Assessment and  Plan: HYPERTENSION, BENIGN ESSENTIAL, UNCONTROLLED - Plan: benazepril (LOTENSIN) 40 MG tablet  Uncontrolled type 2 diabetes mellitus with hyperglycemia, without long-term current use of insulin (HCC) - Plan: Hemoglobin A1c, Comprehensive metabolic panel  Dyslipidemia - Plan: Lipid panel  Gastroesophageal reflux disease - Plan: omeprazole (PRILOSEC) 40 MG capsule  Left lateral abdominal pain - Plan: CT Abdomen Pelvis W Contrast, Lipase, Comprehensive metabolic panel  Arthralgia, unspecified joint - Plan: diclofenac (VOLTAREN) 75 MG EC tablet  Blood pressure under good control on increased dose of benazepril Patient is taking Ozempic as well as metformin.  Check A1c today Cholesterol panel pending He notes left-sided abdominal pain for about a year, so far no imaging has been done.  Will order CT scan Will plan further follow- up pending labs.  Signed Lamar Blinks, MD   Received labs 12/1- message to pt  Results for orders placed or performed in visit on 08/16/21  Lipid panel  Result Value Ref Range   Cholesterol 175 0 - 200 mg/dL   Triglycerides (H) 0.0 - 149.0 mg/dL    475.0 Triglyceride is over 400; calculations on Lipids are invalid.   HDL 33.30 (L) >39.00 mg/dL   Total CHOL/HDL Ratio 5   Hemoglobin A1c  Result Value Ref Range   Hgb A1c MFr Bld 8.7 (H) 4.6 - 6.5 %  Lipase  Result Value Ref Range   Lipase 42.0 11.0 - 59.0 U/L  Comprehensive metabolic panel  Result Value Ref Range   Sodium 136 135 - 145 mEq/L   Potassium 5.6 No hemolysis seen (H) 3.5 - 5.1 mEq/L   Chloride 105 96 - 112 mEq/L   CO2 24 19 - 32 mEq/L   Glucose, Bld 178 (  H) 70 - 99 mg/dL   BUN 28 (H) 6 - 23 mg/dL   Creatinine, Ser 1.43 0.40 - 1.50 mg/dL   Total Bilirubin 0.4 0.2 - 1.2 mg/dL   Alkaline Phosphatase 36 (L) 39 - 117 U/L   AST 26 0 - 37 U/L   ALT 32 0 - 53 U/L   Total Protein 7.3 6.0 - 8.3 g/dL   Albumin 4.3 3.5 - 5.2 g/dL   GFR 48.77 (L) >60.00 mL/min   Calcium 9.3 8.4 - 10.5 mg/dL   LDL cholesterol, direct  Result Value Ref Range   Direct LDL 106.0 mg/dL

## 2021-08-15 NOTE — Progress Notes (Signed)
[facility])                                            [place] Team                                        Statin Quality Measure Assessment    08/15/2021  [place] [facility] [birth year] [id number]  Per review of chart and payor information, this patient has been flagged for non-adherence to the following CMS Quality Measure:   [x]  Statin Use in Persons with Diabetes  []  Statin Use in Persons with Cardiovascular Disease  The 10-year ASCVD risk score (Arnett DK, et al., 2019) is: 59%   Values used to calculate the score:     Age: 75 years     Sex: Male     Is Non-Hispanic African American: No     Diabetic: Yes     Tobacco smoker: Yes     Systolic Blood Pressure: 563 mmHg     Is BP treated: Yes     HDL Cholesterol: 25 mg/dL     Total Cholesterol: 145 mg/dL  Patient has a h/o statin intolerance and was coded last year with CPT 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 Praluent on file but it has not been filled since June 2022 at a retail pharmacy as per pharmacy claims. Additionally, last O/v with pharmacist, Cherre Robins, patient reported he could not afford Praluent (needs prior authorization) and it is not on the [place] formulary. If deemed therapeutically appropriate, please consider associating an accepted CMS code (see below) and assess need for statin alternative.   Please consider ONE of the following recommendations:   Initiate high intensity statin Atorvastatin 40mg  once daily, #90, 3 refills   Rosuvastatin 20mg  once daily, #90, 3 refills     Initiate moderate intensity          statin with reduced frequency if prior          statin intolerance 1x weekly, #13, 3 refills   2x weekly, #26, 3 refills   3x weekly, #39, 3 refills     Code for past statin intolerance or  other exclusions (required annually)   Provider Requirements: Associate code during an office visit or telehealth encounter  Drug Induced Myopathy  G72.0   Myopathy, unspecified G72.9   Myositis, unspecified M60.9   Rhabdomyolysis M62.82   Myalgia - SPC ONLY O75.6   Alcoholic fatty liver E33.2   Cirrhosis of liver K74.69   Prediabetes R73.03   Other abnormal blood glucose OR Elevated hemoglobin A1c R73.09   PCOS E28.2   Toxic liver disease, unspecified K71.9   Adverse effect of antihyperlipidemic and antiarteriosclerotic drugs, initial encounter - SUPD ONLY T46.6X5A     Thank you for your time,  Kristeen Miss, [place] Cell: [phone]

## 2021-08-16 ENCOUNTER — Ambulatory Visit: Payer: Medicare HMO | Attending: Internal Medicine

## 2021-08-16 ENCOUNTER — Ambulatory Visit (INDEPENDENT_AMBULATORY_CARE_PROVIDER_SITE_OTHER): Payer: Medicare HMO | Admitting: Family Medicine

## 2021-08-16 VITALS — BP 130/84 | HR 68 | Temp 98.6°F | Resp 18 | Ht 69.0 in | Wt 237.8 lb

## 2021-08-16 DIAGNOSIS — M1990 Unspecified osteoarthritis, unspecified site: Secondary | ICD-10-CM

## 2021-08-16 DIAGNOSIS — Z7984 Long term (current) use of oral hypoglycemic drugs: Secondary | ICD-10-CM

## 2021-08-16 DIAGNOSIS — K219 Gastro-esophageal reflux disease without esophagitis: Secondary | ICD-10-CM

## 2021-08-16 DIAGNOSIS — R109 Unspecified abdominal pain: Secondary | ICD-10-CM | POA: Diagnosis not present

## 2021-08-16 DIAGNOSIS — I10 Essential (primary) hypertension: Secondary | ICD-10-CM

## 2021-08-16 DIAGNOSIS — E1165 Type 2 diabetes mellitus with hyperglycemia: Secondary | ICD-10-CM

## 2021-08-16 DIAGNOSIS — E785 Hyperlipidemia, unspecified: Secondary | ICD-10-CM

## 2021-08-16 DIAGNOSIS — E875 Hyperkalemia: Secondary | ICD-10-CM

## 2021-08-16 DIAGNOSIS — E1169 Type 2 diabetes mellitus with other specified complication: Secondary | ICD-10-CM

## 2021-08-16 DIAGNOSIS — Z23 Encounter for immunization: Secondary | ICD-10-CM

## 2021-08-16 DIAGNOSIS — M2550 Pain in unspecified joint: Secondary | ICD-10-CM

## 2021-08-16 LAB — COMPREHENSIVE METABOLIC PANEL
ALT: 32 U/L (ref 0–53)
AST: 26 U/L (ref 0–37)
Albumin: 4.3 g/dL (ref 3.5–5.2)
Alkaline Phosphatase: 36 U/L — ABNORMAL LOW (ref 39–117)
BUN: 28 mg/dL — ABNORMAL HIGH (ref 6–23)
CO2: 24 mEq/L (ref 19–32)
Calcium: 9.3 mg/dL (ref 8.4–10.5)
Chloride: 105 mEq/L (ref 96–112)
Creatinine, Ser: 1.43 mg/dL (ref 0.40–1.50)
GFR: 48.77 mL/min — ABNORMAL LOW (ref >60.00)
Glucose, Bld: 178 mg/dL — ABNORMAL HIGH (ref 70–99)
Potassium: 5.6 mEq/L — ABNORMAL HIGH (ref 3.5–5.1)
Sodium: 136 mEq/L (ref 135–145)
Total Bilirubin: 0.4 mg/dL (ref 0.2–1.2)
Total Protein: 7.3 g/dL (ref 6.0–8.3)

## 2021-08-16 LAB — HEMOGLOBIN A1C: Hgb A1c MFr Bld: 8.7 % — ABNORMAL HIGH (ref 4.6–6.5)

## 2021-08-16 LAB — LIPID PANEL
Cholesterol: 175 mg/dL (ref 0–200)
HDL: 33.3 mg/dL — ABNORMAL LOW (ref >39.00)
Total CHOL/HDL Ratio: 5
Triglycerides: 475 mg/dL — ABNORMAL HIGH (ref 0.0–149.0)

## 2021-08-16 LAB — LIPASE: Lipase: 42 U/L (ref 11.0–59.0)

## 2021-08-16 LAB — LDL CHOLESTEROL, DIRECT: Direct LDL: 106 mg/dL

## 2021-08-16 MED ORDER — OMEPRAZOLE 40 MG PO CPDR
40.0000 mg | DELAYED_RELEASE_CAPSULE | Freq: Every day | ORAL | 3 refills | Status: DC
Start: 2021-08-16 — End: 2022-10-26

## 2021-08-16 MED ORDER — DICLOFENAC SODIUM 75 MG PO TBEC: DELAYED_RELEASE_TABLET | ORAL | 3 refills | Status: DC

## 2021-08-16 MED ORDER — BENAZEPRIL HCL 40 MG PO TABS
40.0000 mg | ORAL_TABLET | Freq: Two times a day (BID) | ORAL | 3 refills | Status: DC
Start: 2021-08-16 — End: 2022-05-29

## 2021-08-16 NOTE — Progress Notes (Signed)
   Covid-19 Vaccination Clinic  Name:  Timothy Chapman    MRN: [id number] DOB: [birth year]  08/16/2021  [name] was observed post Covid-19 immunization for 15 minutes without incident. He was provided with Vaccine Information Sheet and instruction to access the V-Safe system.   [name] was instructed to call 911 with any severe reactions post vaccine: Difficulty breathing  Swelling of face and throat  A fast heartbeat  A bad rash all over body  Dizziness and weakness   Immunizations Administered     Name Date Dose VIS Date Route   Pfizer Covid-19 Vaccine Bivalent Booster 08/16/2021 11:12 AM 0.3 mL 05/17/2021 Intramuscular   Manufacturer: [place]   Lot: [id number]   [place]: [phone]

## 2021-08-16 NOTE — Patient Instructions (Addendum)
The higher dose of benazepril (40 twice a day) seems to be working- continue this Will be in touch with labs [place] to get newest covid booster Will set up a CT scan of your abdomen due to the left sided pain you have noticed

## 2021-08-17 ENCOUNTER — Encounter: Payer: Self-pay | Admitting: Family Medicine

## 2021-08-17 NOTE — Addendum Note (Signed)
Addended by: Lamar Blinks C on: 08/17/2021 11:10 AM   Modules accepted: Orders

## 2021-08-19 ENCOUNTER — Other Ambulatory Visit (HOSPITAL_BASED_OUTPATIENT_CLINIC_OR_DEPARTMENT_OTHER): Payer: Self-pay

## 2021-08-19 MED ORDER — PFIZER COVID-19 VAC BIVALENT 30 MCG/0.3ML IM SUSP
INTRAMUSCULAR | 0 refills | Status: DC
  Filled 2021-08-19: qty 0.3, 1d supply, fill #0

## 2021-08-21 ENCOUNTER — Other Ambulatory Visit (HOSPITAL_BASED_OUTPATIENT_CLINIC_OR_DEPARTMENT_OTHER): Payer: Self-pay

## 2021-08-21 ENCOUNTER — Telehealth: Payer: Self-pay | Admitting: *Deleted

## 2021-08-21 ENCOUNTER — Ambulatory Visit: Payer: Medicare HMO | Admitting: Family Medicine

## 2021-08-21 NOTE — Telephone Encounter (Signed)
Called to schedule pt since we had an open appointment. He said his sxs have resolved.

## 2021-08-21 NOTE — Telephone Encounter (Signed)
FYI- Home Care Given.  Contact Type Call Who Is Calling Patient / Member / Family / Caregiver Call Type Triage / Clinical Relationship To Patient Self Return Phone Number [phone] (Primary) Chief Complaint Immunization Reaction Reason for Call Symptomatic / Request for Health Information Initial Comment Caller states, reaction to covid 19 booster- itchy/ swollen hands. Translation No Nurse Assessment Nurse: Lucky Cowboy, RN, Levada Dy Date/Time (Eastern Time): 08/18/2021 11:33:20 AM Confirm and document reason for call. If symptomatic, describe symptoms. ---Caller stated that he had COVID booster on Wed. He has itchy swollen hands since that evening. The itching has subsided but the swelling is still present.   Disp. Time Eilene Ghazi Time) Disposition Final User 08/18/2021 11:18:04 AM Attempt made - message left Dew, RN, Levada Dy 08/18/2021 11:41:53 AM Home Care Yes Dew, RN, Glenard Haring

## 2021-08-25 ENCOUNTER — Other Ambulatory Visit: Payer: Self-pay | Admitting: Family Medicine

## 2021-08-31 ENCOUNTER — Telehealth (HOSPITAL_BASED_OUTPATIENT_CLINIC_OR_DEPARTMENT_OTHER): Payer: Self-pay

## 2021-09-01 ENCOUNTER — Encounter (HOSPITAL_BASED_OUTPATIENT_CLINIC_OR_DEPARTMENT_OTHER): Payer: Self-pay

## 2021-09-01 ENCOUNTER — Other Ambulatory Visit: Payer: Self-pay

## 2021-09-01 ENCOUNTER — Ambulatory Visit (HOSPITAL_BASED_OUTPATIENT_CLINIC_OR_DEPARTMENT_OTHER)
Admission: RE | Admit: 2021-09-01 | Discharge: 2021-09-01 | Disposition: A | Discharge status: Home / Self Care | Payer: Medicare HMO | Source: Ambulatory Visit | Attending: Family Medicine | Admitting: Family Medicine

## 2021-09-01 DIAGNOSIS — R109 Unspecified abdominal pain: Secondary | ICD-10-CM | POA: Insufficient documentation

## 2021-09-01 DIAGNOSIS — K5730 Diverticulosis of large intestine without perforation or abscess without bleeding: Secondary | ICD-10-CM | POA: Diagnosis not present

## 2021-09-01 DIAGNOSIS — N400 Enlarged prostate without lower urinary tract symptoms: Secondary | ICD-10-CM | POA: Diagnosis not present

## 2021-09-01 DIAGNOSIS — M47816 Spondylosis without myelopathy or radiculopathy, lumbar region: Secondary | ICD-10-CM | POA: Diagnosis not present

## 2021-09-01 DIAGNOSIS — N281 Cyst of kidney, acquired: Secondary | ICD-10-CM | POA: Diagnosis not present

## 2021-09-01 MED ORDER — IOHEXOL 300 MG/ML  SOLN
100.0000 mL | Freq: Once | INTRAMUSCULAR | Status: AC | PRN
  Administered 2021-09-01: 100 mL via INTRAVENOUS

## 2021-09-02 ENCOUNTER — Encounter: Payer: Self-pay | Admitting: Family Medicine

## 2021-09-27 ENCOUNTER — Ambulatory Visit (INDEPENDENT_AMBULATORY_CARE_PROVIDER_SITE_OTHER): Payer: Medicare HMO | Admitting: Pharmacist

## 2021-09-27 DIAGNOSIS — E1165 Type 2 diabetes mellitus with hyperglycemia: Secondary | ICD-10-CM

## 2021-09-27 DIAGNOSIS — E785 Hyperlipidemia, unspecified: Secondary | ICD-10-CM

## 2021-09-27 DIAGNOSIS — I10 Essential (primary) hypertension: Secondary | ICD-10-CM

## 2021-09-29 ENCOUNTER — Encounter: Payer: Self-pay | Admitting: Family Medicine

## 2021-09-30 NOTE — Patient Instructions (Addendum)
Mr. Jeannett Senior It was a pleasure speaking with you today.  I have attached a summary of our visit today and information about your health goals.   Our next appointment is by telephone on October 23, 2021 at 1:45pm  Please call the care guide team at [phone] if you need to cancel or reschedule your appointment.   You were approved for $2500 through 08/27/2022 from the [facility]. It can be used for any cholesterol medication - you can use this for copay or when you reach coverage gap for Praluent. You should receive a letter with a card / information that you will take to the pharmacy when you refill Praluent.   [facility] about [facility]em[facility] refill.   If you have any questions or concerns, please feel free to contact me either at the phone number below or with a MyChart message.   Keep up the good work!  Cherre Robins, PharmD Clinical Pharmacist [facility] [phone] (direct line)  [phone] (main office number)  CARE PLAN ENTRY  Hypertension BP Readings from Last 3 Encounters:  08/16/21 130/84  06/28/21 (!) 142/84  04/26/21 124/80   Pharmacist Clinical Goal(s): Over the next 90 days, patient will work with PharmD and providers to maintain BP goal <140/90 while minimizing side effects / dizziness and falls. Current regimen:  Losartan 100 - take 1 tablet daily Interventions: Discussed blood pressure goal Discussed diet and exercise Recommended patient check blood pressure 1 to 3 times per week, document, and provide at future appointments Patient states benazepril on own - recommended he could take 40mg  twice a day but not both benazepril and losartan as they are similar medications. Also discuss other options like low dose hydrochlorothiazide to help with blood pressure  Patient self care activities - Over the next 90 days, patient will: Check blood pressure 1 to 3 times per week, document, and provide at future  appointments Ensure daily salt intake < 2300 mg/day Hold losartan Take benazepril 40mg  twice a day   Hyperlipidemia with elevated triglycerides Lipid Panel     Component Value Date/Time   CHOL 145 04/26/2021 1201   TRIG (H) 04/26/2021 1201    815.0 Triglyceride is over 400; calculations on Lipids are invalid.   HDL 25.00 (L) 04/26/2021 1201   CHOLHDL 6 04/26/2021 1201   LDLDIRECT 34.0 04/26/2021 1201    Pharmacist Clinical Goal(s): Over the next 90 days, patient will work with PharmD and providers to achieve LDL goal < 100 and Triglycerides <150 Current regimen:  Aspirin 81mg  daily Niaspan CR 1000mg  at bedtime (lowers triglycerides and can increase HDL) Salmon Oil 1000mg  twice daily per [facility] (lowers triglycerides and can increase HDL) Praluent 75mg  every 14 days (lowers LDL mostly be also can lower triglycerides) Interventions: Discussed lipid goals and risk associated with elevated triglycerides Reviewed foods that can increase triglycerides; continue to limit intake of alcohol, sweets / sugar, potatoes, bread, rice and pasta Reviewed [facility] coverage of Praluent. Would require prior authorization by [facility] primary care provider. Praluent does have medication assistance program. Mailed application for patient assistance for Praluent in September 2022 Patient self care activities - Over the next 90 days, patient will: Maintain cholesterol medication regimen.  Limit intake of foods that can increase triglycerides Encouraged increased physical activity Complete application for Praluent medication assistance and return to our office  Diabetes Lab Results  Component Value Date/Time   HGBA1C 8.7 (H) 08/16/2021 10:58 AM   HGBA1C 9.7 (H) 04/26/2021  12:01 PM   HGBA1C 8.2 08/12/2018 12:00 AM  A1c at Dr Theodosia Blender office was 10.6% on 02/10/2021  Pharmacist Clinical Goal(s): Over the next 90 days, patient will work with PharmD and providers to achieve A1c goal <7% Current regimen:  Ozempic -  inject 1.5mg  weekly Metformin XR 500mg  - take 2 tablets = 1000mg  twice a day with morning and evening meals Interventions: Discussed A1c and blood glucose goals Reviewed home blood glucose readings and reviewed goals  Fasting blood glucose goal (before meals) = 80 to 130 Blood glucose goal after a meal = less than 180  Patient self care activities - Over the next 90 days, patient will: Check blood sugar once daily, document, and provide at future appointments Limit intake of high carbohyrates foods and sugar - continue with low carbohydrate diet Continue to increase physical activity - goal is to work up to at least 150 minutes per week.  Contact provider with any episodes of hypoglycemia or blood glucose over 250 or less than 80  Prostate Health /  elevated PSA; Interventions: Follow up with urology clinic Patient self care activities - Over the next 30 days, patient will: Continue to follow up with urology clinic   Osteoarthritis Pharmacist Clinical Goal(s) Over the next 90 days, patient will work with PharmD and providers to reduce symptoms associated with arthritis Current regimen:  Alpha lipoic acid 600mg  daily  Diclofenac 75mg  up to twice a day if needed for pain Patient self care activities - Over the next 90 days, patient will: Maintain medication regimen for arthritis    GERD Pharmacist Clinical Goal(s) Over the next 90 days, patient will work with PharmD and providers to reduce symptoms associated with GERD Current regimen:  Famotidine 20mg  twice daily Omeprazole 40mg  daily at bedtime Patient self care activities - Over the next 90 days, patient will: Take omeprazole in morning on empty stomach and take famotidine at bedtime  Health Maintenance:  Reviewed vaccination history and discussed benefits of annual flu vaccine and COVID Booster Patient to get the following vaccines at next provider appointment, local pharmacy or [place]: flu vaccine and  COVID booster  Medication management Pharmacist Clinical Goal(s): Over the next 90 days, patient will work with PharmD and providers to maintain optimal medication adherence Current [facility] Interventions Comprehensive medication review performed. Continue current medication management strategy Verified patient has reduced vitamin B12 supplement to every other day as recommended by Dr Lorelei Pont after last labs. Patient self care activities - Over the next 90 days, patient will: Focus on medication adherence by filling and taking medications appropriately  Take medications as prescribed Report any questions or concerns to PharmD and/or provider(s)   Patient Goals/Self-Care Activities Over the next 180 days, patient will: take medications as prescribed check glucose daily , document, and provide at future appointments,  check blood pressure 1 to 3 times per week, document, and provide at future appointments  Patient verbalizes understanding of instructions and care plan provided today and agrees to view in [place]. Active MyChart status confirmed with patient.

## 2021-09-30 NOTE — Chronic Care Management (AMB) (Signed)
Chronic Care Management Pharmacy Note  09/30/2021 Name:  Timothy Chapman MRN:  [id number] DOB:  11-Aug-1946  Summary: A1c improving but still above goal. He has been able to increase Ozempic a little and is taking 1.12m weekly (was unable to tolerate 245mdaily).    Following low CHO diet with weight loss of about 30 lbs over the last 6 months.  States he cannot afford Praluent - has not taken in 4 months. Applied for HeEstée Lauderssistance with Praluent and any medication for hypercholesterolemia. Patient was approved for $2500 from 08/28/2021 thru 08/27/2022 Blood pressure has been at goal since increased benazepril to 4024mwice a day.   Subjective: Timothy Chapman an [age]o. year old male who is a primary patient of Copland, JesGay FillerD.  The CCM team was consulted for assistance with disease management and care coordination needs.    Engaged with patient by telephone for follow up visit in response to provider referral for pharmacy case management and/or care coordination services.   Consent to Services:  The patient was given information about Chronic Care Management services, agreed to services, and gave verbal consent prior to initiation of services.  Please see initial visit note for detailed documentation.   Patient Care Team: Copland, JesGay FillerD as PCP - General (Family Medicine) EckCherre RobinsPH-CPP (Pharmacist)  Recent office visits: 08/17/2021 - Patient Message - Dr CopLorelei Pontncouraged adherence to Praulent and Niacin due to elevated Tg and LDL. Recommended limiting intake of food high in potassium 08/16/2021 - Fam Med (Dr CopLorelei Pont/U HTN and DM. Noted left lateral abdominal pain. CT of abdomen ordered.  04/26/21 JesLamar Blinks - Vertigo/dizziness - labs ordered - referred to community neurology for evaluation. Patient advised after labs returned to restart Praluent and decreaes B12 supplement to every OTHER day 03/29/2021 - phone call - reported  metallic taste with Paxlovid; recommended he stop 03/28/2021 - Dr WesJoyce GrossCOVID positive on 03/24/2021. Prescribed Paxlovid 300m33md for 5 days and benzonatate 200mg20mto tid prn for 14 days.   Recent consult visits: [facility]2021 [facility] clinically significant focal prostate cancer. Attention on followup as indicated.  06/28/2021 - Neurology (Dr ChimaBilley Goslingluation of neuropathy and falls.He has tried multiple treatments for neuropathy previously and is not interested in trying any more medications. Checked labs and recommended continue physical therapy.  06/27/2021 - [facility] up BRVO with macular edema of left eye. Received Eylea injection in left eye 06/21/2021 - Urology ([facility]) f/u prostate cancer and his elevated serum PSA value. I will repeat PSA today. Ordered repeat MRI. Also discussed repeat prostate biopsy given his history of prostate cancer.  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 Good response to Eylea. Received #13 OVE of left eyee at this visit. F/U in 8 weeks for reevluation and IVE. 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 Stressed better BG control with goal of A1c < 7.0. Monitor cataract of both eyes.   Hospital visits: None in previous 6 months  Objective:  Lab Results  Component Value Date   CREATININE 1.43 08/16/2021   CREATININE 1.44 04/26/2021   CREATININE 1.20 (H) 07/06/2020    Lab Results  Component Value Date   HGBA1C 8.7 (H) 08/16/2021   Last diabetic Eye exam:  Lab  Results  Component Value Date/Time   HMDIABEYEEXA Retinopathy (A) 06/27/2021 12:00 AM    Last diabetic Foot exam: No results found for: HMDIABFOOTEX      Component Value Date/Time   CHOL 175 08/16/2021 1058   TRIG (H) 08/16/2021 1058    475.0 Triglyceride is over 400; calculations on Lipids are  invalid.   HDL 33.30 (L) 08/16/2021 1058   CHOLHDL 5 08/16/2021 1058   VLDL 57.0 (H) 07/09/2017 0826   LDLCALC  07/06/2020 1448     Comment:     . LDL cholesterol not calculated. Triglyceride levels greater than 400 mg/dL invalidate calculated LDL results. . Reference range: <100 . Desirable range <100 mg/dL for primary prevention;   <70 mg/dL for patients with CHD or diabetic patients  with > or = 2 CHD risk factors. Marland Kitchen LDL-C is now calculated using the Martin-Hopkins  calculation, which is a validated novel method providing  better accuracy than the Friedewald equation in the  estimation of LDL-C.  Cresenciano Genre et al. Annamaria Helling. 4142;395(32): 2061-2068  (http://education.Quest[employer].com/faq/FAQ164)    LDLDIRECT 106.0 08/16/2021 1058    Hepatic Function Latest Ref Rng & Units 08/16/2021 06/28/2021 04/26/2021  Total Protein 6.0 - 8.3 g/dL 7.3 7.9 7.5  Albumin 3.5 - 5.2 g/dL 4.3 - 4.2  AST 0 - 37 U/L 26 - 32  ALT 0 - 53 U/L 32 - 33  Alk Phosphatase 39 - 117 U/L 36(L) - 48  Total Bilirubin 0.2 - 1.2 mg/dL 0.4 - 0.4  Bilirubin, Direct 0.1 - 0.5 mg/dL - - -    No results found for: TSH, FREET4  CBC Latest Ref Rng & Units 04/26/2021 07/06/2020 05/28/2019  WBC 4.0 - 10.5 K/uL 6.9 8.9 7.2  Hemoglobin 13.0 - 17.0 g/dL 12.1(L) 13.3 12.2(L)  Hematocrit 39.0 - 52.0 % 36.5(L) 40.7 37.5(L)  Platelets 150.0 - 400.0 K/uL 232.0 247 276.0    No results found for: VD25OH  Clinical ASCVD: Yes  The 10-year ASCVD risk score (Arnett DK, et al., 2019) is: 52%   Values used to calculate the score:     Age: 8 years     Sex: Male     Is Non-Hispanic African American: No     Diabetic: Yes     Tobacco smoker: Yes     Systolic Blood Pressure: 023 mmHg     Is BP treated: Yes     HDL Cholesterol: 33.3 mg/dL     Total Cholesterol: 175 mg/dL     Social History   Tobacco Use  Smoking Status Former   Types: Cigarettes   Quit date: 09/18/1975   Years since quitting: 46.0  Smokeless Tobacco  Never   BP Readings from Last 3 Encounters:  08/16/21 130/84  06/28/21 (!) 142/84  04/26/21 124/80   Pulse Readings from Last 3 Encounters:  08/16/21 68  06/28/21 64  04/26/21 87   Wt Readings from Last 3 Encounters:  08/16/21 237 lb 12.8 oz (107.9 kg)  06/28/21 232 lb (105.2 kg)  04/26/21 236 lb (107 kg)    Assessment: Review of patient past medical history, allergies, medications, health status, including review of consultants reports, laboratory and other test data, was performed as part of comprehensive evaluation and provision of chronic care management services.   SDOH:  (Social Determinants of Health) assessments and interventions performed:  SDOH Interventions    Flowsheet Row Most Recent Value  SDOH Interventions   Financial Strain Interventions Other (Comment)  [applied for [name] for Praluent]  CCM Care Plan  Allergies  Allergen Reactions   Codeine     hallucinations   Farxiga [Dapagliflozin] Other (See Comments)    Genital infection and irritation   Statins     Has tried several cannot tolerate    Medications Reviewed Today     Reviewed by Cherre Robins, RPH-CPP (Pharmacist) on 09/27/21 at 1516  Med List Status: <None>   Medication Order Taking? Sig Documenting Provider Last Dose Status Informant  Acetylcarnitine HCl 500 MG CAPS [id number] Yes Take 500 mg by mouth daily. [provider] Taking Active Self           Med Note Rosemarie Beath, MELISSA B   Thu Nov 21, 2017 12:03 PM)    Alcohol Swabs (B-D SINGLE USE SWABS REGULAR) PADS [id number] Yes USE PRIOR TO INSULIN DOSE Copland, Gay Filler, MD Taking Active   Alirocumab (PRALUENT) 75 MG/ML SOAJ [id number] No INJECT 75MG INTO THE SKIN EVERY 14 DAYS  Patient not taking: Reported on 09/27/2021   Copland, Gay Filler, MD Not Taking Active   Alpha-Lipoic Acid 600 MG CAPS [id number] Yes Take 600 mg by mouth daily. [provider] Taking Active Self           Med Note Antony Contras, Etienne Mowers B    Tue Apr 04, 2021  1:16 PM)    aspirin 81 MG chewable tablet [id number] Yes Chew 1 tablet (81 mg total) by mouth daily. Shelly Coss, MD Taking Active Self  benazepril (LOTENSIN) 40 MG tablet [id number] Yes Take 1 tablet (40 mg total) by mouth in the morning and at bedtime. Copland, Gay Filler, MD Taking Active   diclofenac (VOLTAREN) 75 MG EC tablet [id number] Yes Take one BID prn joint pain Copland, Gay Filler, MD Taking Active   famotidine (PEPCID) 20 MG tablet [id number] Yes Take 1 tablet (20 mg total) by mouth 2 (two) times daily. Copland, Gay Filler, MD Taking Active            Med Note Theodosia Blender Apr 04, 2021  1:18 PM) Taking as needed  Lancets (ONETOUCH DELICA PLUS EVOJJK09F) [place] [id number] Yes USE TWO TIMES A DAY TO     CHECK BLOOD SUGAR Copland, Gay Filler, MD Taking Active   melatonin 5 MG TABS [id number] Yes Take 5 mg by mouth at bedtime.  [provider] Taking Active Self  metFORMIN (GLUCOPHAGE-XR) 500 MG 24 hr tablet [id number] Yes Take 2 tablets (1,000 mg total) by mouth 2 (two) times daily after a meal. Take with food Copland, Gay Filler, MD Taking Active   Multiple Vitamin (MULTIVITAMIN) tablet [id number] Yes Take 1 tablet by mouth daily. [provider] Taking Active Self  niacin (NIASPAN) 1000 MG CR tablet [id number] No Take 1,000 mg by mouth at bedtime.  Patient not taking: Reported on 09/27/2021   [provider] Not Taking Active   omeprazole (PRILOSEC) 40 MG capsule [id number] Yes Take 1 capsule (40 mg total) by mouth daily. Copland, Gay Filler, MD Taking Active   [facility] VERIO test strip [id number] Yes USE AS INSTRUCTED Copland, Gay Filler, MD Taking Active   OVER THE COUNTER MEDICATION [id number] Yes Singing nettle daily for prostate [provider] Taking Active   Semaglutide, 2 MG/DOSE, (OZEMPIC, 2 MG/DOSE,) 8 MG/3ML SOPN [id number] Yes Inject 1.5 mg into the skin once a week. [provider] Taking Active   tadalafil (CIALIS) 10 MG  tablet [id number] Yes Take 1 tablet (10 mg total) by mouth daily as needed for erectile dysfunction.  Max 20 mg total per day Copland, Gay Filler, MD Taking Active   tadalafil (CIALIS) 5 MG tablet [id number] Yes Take 1 tablet (5 mg total) by mouth daily. Copland, Gay Filler, MD Taking Active   vitamin C (ASCORBIC ACID) 500 MG tablet [id number] Yes Take 500 mg by mouth daily. 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   Paroxysmal atrial fibrillation ([place]) 12/16/2017   SBO (small bowel obstruction) ([place]) 10/08/2017   ATN (acute tubular necrosis) ([place]) 10/08/2017   Septic shock ([place]) 10/04/2017   Acute cholecystitis s/p perc cholecystomy drainage 10/04/2017    Acute respiratory failure with hypoxia ([facility])    Chronic back pain 02/08/2016   Controlled type 2 diabetes mellitus with diabetic neuropathy, without long-term current use of insulin ([place]) 02/08/2016   Insomnia 02/08/2016   History of diverticulitis 02/08/2016   HYPERTENSION, BENIGN ESSENTIAL, UNCONTROLLED 03/12/2009   ABNORMAL ELECTROCARDIOGRAM 03/12/2009    Immunization History  Administered Date(s) Administered   Fluad Quad(high Dose 65+) 05/28/2019, 07/06/2020   Influenza, High Dose Seasonal PF 06/20/2016, 07/08/2017   Influenza-Unspecified 08/12/2021   PFIZER Comirnaty(Gray Top)Covid-19 Tri-Sucrose Vaccine 11/15/2019, 12/15/2019, 09/02/2020   PFIZER(Purple Top)SARS-COV-2 Vaccination 11/15/2019, 12/15/2019   Pfizer Covid-19 Vaccine Bivalent Booster 44yr & up 08/16/2021   Pneumococcal Conjugate-13 06/20/2016   Pneumococcal Polysaccharide-23 02/06/2018, 05/28/2019   Pneumococcal-Unspecified 12/20/2011, 05/18/2012   Tdap 09/18/2011, 10/31/2011   Zoster Recombinat (Shingrix) 02/19/2020, 05/03/2020   Conditions to be addressed/monitored: HTN, HLD, DMII, GERD, BPH, and arthritis pain; ED  Care Plan : General Pharmacy (Adult)   Updates made by ECherre Robins RPH-CPP since 09/30/2021 12:00 AM     Problem: Chronic Disease Management support, education, and care coordination needs related to HTN, Hyperlipidemia, Diabetes, Insomnia, GERD, Pain, BPH/ED   Note:   Current Barriers:  Unable to maintain control of type 2 DM Does not notify physician's office if has questions or concerns In need of Chronic Disease Management support, education, and care coordination related to Hypertension, Hyperlipidemia, Diabetes, GERD (with Barrett's Esophagus); NASH; Pain, BPH/ED History of low adherence to maintenance medications Unable to afford therapy for hyperlipidemia (Praluent)   Pharmacist Clinical Goal(s):  Over the next 180 days, patient will verbalize ability to afford treatment regimen achieve adherence to monitoring guidelines and medication adherence to achieve therapeutic efficacy achieve control of diabetes and hypertension as evidenced by A1c < 7.0% and BP <140/90 adhere to prescribed medication regimen as evidenced by refill history and meeting of goal mentioned below  through collaboration with PharmD and provider.  Better understand medication regimen and improve adherence to medication therapy Maintain communication with PCP's office   Interventions: 1:1 collaboration with Copland, JGay Filler MD regarding development and update of comprehensive plan of care as evidenced by provider attestation and co-signature Inter-disciplinary care team collaboration (see longitudinal plan of care) Comprehensive medication review performed; medication list updated in electronic medical record  Diabetes: Uncontrolled - A1c goal <7.0% A1c is not at goal but improved from 10.6% 02/10/2021 to 9.7% 04/26/2021 and 8.7% 07/2021 Current regimen:  Ozempic - inject 1.580mweekly Metformin XR 50071m take 2 tablets = 1000m72mice a day with morning and evening meals Was unable to tolerate FarxIranaused irritation and infection of  genitals; Rybelsus - cost and more nasuea than Ozempic Also was unable to tolerated higher Ozempic dose 2mg 74mkly - caused nausea so he restarted  2m dose. Today he reports that he has increased dose to 1.54mweekly and has been tolerating well.  Had one episode of nausea C-Peptide [facility] 01/2021 was high normal at 4.06 Patient reports recent home BG: ranging from 110 to 200 (increased over the holidays)  Denies BG <80 / hypoglycemic symptoms [facility] ShManuella Ghazieight 07/06/2020 was 253; weight 08/16/2021 was 237 (weight loss of 22 lbs). Reviewed diet - patient states he has been following low carb/ keto type diet with intermittent fasting but did not follow as closely during holidays.  Exercise: using treadmill a few times per week Interventions: Discussed A1c and blood glucose goals Reviewed home blood glucose readings and reviewed goals  Fasting blood glucose goal (before meals) = 80 to 130 Blood glucose goal after a meal = less than 180  Continue with current medication. If A1c still > 8.0 at next visit, consider long acting insulin addition or Mounjaro (though will need to check coverage through [place] he only received 2 penn of Ozempic form [facility] with last refill and he was running out before 3 months. Discussed that there have been supply issues with Ozempic at the end of 2022 and [place] have been limiting amount they provided per patient. Recommended patient call [facility] to check to see if he could request refill.  Continue to follow low carbohydrate diet and exercise at least 150 minutes per week.   Hypertension: BP Readings from Last 3 Encounters:  08/16/21 130/84  06/28/21 (!) 142/84  04/26/21 124/80  Blood pressure at goal at last office visit. However patient states blood pressure elevated with SBP 170's at visit with Dr ShManuella GhaziHe states usually elevated at appointment prior to visit with Dr ShManuella GhaziPossibly stress related to  anticipated eye injection.  Goal BP <140/90 Current regimen:  Benazepril 4038mwice a day Home blood pressure today per patient 136/80; other home readings 130' to 140's / 70-80's. Previous medications tried: amlodipine - stopped due to dizziness; losartan-hydrochlorothiazide - patient concerned for hydrochlorothiazide side effects and did not feel that he needed it because his "ankles are skinny and shriveled up" Patient reports he was falling a lot when he was taking amlodipine; Dr WesJoyce Grossth [facility] Elderopped amlodipine in May and patient reports he is no longer falling.  He saw neurology for dizziness / neuropathy 06/2021. No cause of dizziness noted. No further follow up recommended unless patient would like to pursue treatment of neuropathy.  Interventions: Recommended patient check blood pressure 1 to 3 times per week, document, and provide at future appointments  Recommended continue benazepil 34m29mice a day  Discussed other options like adding back a little hydrochlorothiazide (12.5mg)47mily but patient does not want to take hydrochlorothiazide.  Discussed blood pressure goal Discussed diet and exercise Ensure daily salt intake < 2300 mg/day  Hyperlipidemia with elevated triglycerides LDL nor triglycerides at goal;  Triglycerides have improved from 815 to 475. HDLC also improaved form 25 to 33.  LDL goal < 100 and Triglycerides <150 Current regimen:  Niaspan CR 1000mg 22medtime - take aspirin 81mg p19m to niaspan to decrease flushing Salmon Oil 1000mg tw93mdaily per [facility]  PralNew Mexicont 75mg eve7m4 days (has not taken in 4 months due to cost) Patient has VA benefits - contacted VA to see if Praluent is on formulary. Unfortunately needs prior approval from VA PCP. P[place]ient states he tried to discuss with his [facility] PCP  at last visit but did not have time to address.  I did send application for Praluent medication assistance in 2022 but patient has not complete.  Patient  denied alcohol use Interventions: Discussed lipid goals and risk associated with elevated trilycerides Applied for [name] assistance with Praluent and any medication for hypercholesterolemia. Patient was approved for $2500 from 08/28/2021 thru 08/27/2022 Reviewed foods that can increase triglycerides; encouraged patient to continue to limit intake of alcohol, sweets / sugar, potatoes, bread, rice and pasta Encouraged increased physical activity  Chronic Back Pain / Osteoarthritis Controlled Current regimen:  Alpha lipoic acid 677m daily  Diclofenac 765mup to twice a day if needed for pain Patient self care activities - Over the next 90 days, patient will: Maintain medication regimen for arthritis    GERD Controlled Current regimen:  Famotidine 2050mwice daily Omeprazole 42m53mily at bedtime Patient self care activities - Over the next 90 days, patient will: Take omeprazole in morning on empty stomach and take famotidine at bedtime  Insomnia:  Current Regimen:  Melatonin 5mg 25m Patient reports he is sleeping fairly well at night.  He does have a 1 yea2 old at home who is not sleeping through the night most nights.  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  Interventions: none Continue current regimen for sleep  BPH with elevated PSA; Patient has seen urologist regarding elevated; Had MRI of prostate 07/13/2021 - [facility]ology [facility]) Noted no evidence of a clinically significant focal prostate cancer. Attention on followup as indicated.  [facility]  Health Maintenance:  Printed eye exam form Dr Shah Manuella Ghaziprovided to clinic nurse that updated health maintenance.   Medication management [facility] Has some [facility] benefits Interventions Comprehensive medication review performed. Medication list updated Reviewed refill history and discussed adherence with  patient. Continue current medication management strategy    Patient Goals/Self-Care Activities Over the next 180 days, patient will:  take medications as prescribed, check glucose daily , document, and provide at future appointments, check blood pressure 1 to 3 times per week, document, and provide at future appointments, and collaborate with provider on medication access solutions  Follow Up Plan: Telephone follow up appointment with care management team member scheduled for:  4 to 6 weeks             Medication Assistance: States he cannot afford Praluent - has not taken in 4 months. Applied for HealtEstée Lauderstance with Praluent and any medication for hypercholesterolemia. Patient was approved for $2500 from 08/28/2021 thru 08/27/2022  [facility]:  [facility] [address] [id number]: [phone] [address]: [phone] [facility]- [place] Caremark Sites [address] Great[place] [id number]: [phone] [place]- [address] [place] [id number]: [phone]/pharmacy #[phone] [id number] - [phone] [place] [address]: [phone] Up:  Patient agrees to Care Plan and Follow-up.  Plan: Telephone follow up appointment with care management team member scheduled  for:  1 to 2 months  Cherre Robins, PharmD Clinical Pharmacist [place]

## 2021-10-13 ENCOUNTER — Telehealth: Payer: Self-pay | Admitting: Pharmacist

## 2021-10-13 NOTE — Telephone Encounter (Signed)
Patient has been approved for funding for hypercholesterolemia from [place]: Patient notified.  Pharmacy Care Info mailed to patient ID: [id number] Group: [id number] BIN: [id number] PNC: [id number] PC processor PDMI Effective 08/28/2021 to 08/27/2022

## 2021-10-17 DIAGNOSIS — E785 Hyperlipidemia, unspecified: Secondary | ICD-10-CM | POA: Diagnosis not present

## 2021-10-17 DIAGNOSIS — I10 Essential (primary) hypertension: Secondary | ICD-10-CM

## 2021-10-17 DIAGNOSIS — E1169 Type 2 diabetes mellitus with other specified complication: Secondary | ICD-10-CM

## 2021-10-17 DIAGNOSIS — M1990 Unspecified osteoarthritis, unspecified site: Secondary | ICD-10-CM | POA: Diagnosis not present

## 2021-10-17 DIAGNOSIS — Z7984 Long term (current) use of oral hypoglycemic drugs: Secondary | ICD-10-CM

## 2021-10-23 ENCOUNTER — Ambulatory Visit (INDEPENDENT_AMBULATORY_CARE_PROVIDER_SITE_OTHER): Payer: Medicare HMO | Admitting: Pharmacist

## 2021-10-23 DIAGNOSIS — E1165 Type 2 diabetes mellitus with hyperglycemia: Secondary | ICD-10-CM

## 2021-10-23 DIAGNOSIS — E785 Hyperlipidemia, unspecified: Secondary | ICD-10-CM

## 2021-10-23 DIAGNOSIS — I10 Essential (primary) hypertension: Secondary | ICD-10-CM

## 2021-10-23 NOTE — Patient Instructions (Addendum)
Timothy Chapman It was a pleasure speaking with you today.  I have attached a summary of our visit today and information about your health goals.   If you have any questions or concerns, please feel free to contact me either at the phone number below or with a MyChart message.   Keep up the good work!  Cherre Robins, PharmD Clinical Pharmacist [facility] [phone] (direct line)  [phone] (main office number)  CARE PLAN ENTRY (see longitudinal plan of care for additional care plan information)  Current Barriers:  Chronic Disease Management support, education, and care coordination needs related to Hypertension, Hyperlipidemia, Diabetes, Insomnia, GERD, Pain, BPH/ED   Hypertension BP Readings from Last 3 Encounters:  08/16/21 130/84  06/28/21 (!) 142/84  04/26/21 124/80   Pharmacist Clinical Goal(s): Over the next 90 days, patient will work with PharmD and providers to maintain BP goal <140/90 while minimizing side effects / dizziness and falls. Current regimen:  Benazepril 40mg  twice a day (only taking once at night) Losartan hydrochlorothiazide 100/25mg  each morning (Patient restarted himself due to recent increased blood pressure at home and urology clinic)  Interventions: Discussed blood pressure goal Discussed diet and exercise Recommended patient check blood pressure 1 to 3 times per week, document, and provide at future appointments Patient self care activities - Over the next 90 days, patient will: Check blood pressure 1 to 3 times per week, document, and provide at future appointments Ensure daily salt intake < 2300 mg/day Continue losartan hydrochlorothiazide Add spironolactone 25mg  - take 0.5 tablet = 12.5mg  daily  Stop benazepril 40mg  twice a day   Hyperlipidemia with elevated triglycerides Lipid Panel     Component Value Date/Time   CHOL 145 04/26/2021 1201   TRIG (H) 04/26/2021 1201    815.0 Triglyceride is over 400;  calculations on Lipids are invalid.   HDL 25.00 (L) 04/26/2021 1201   CHOLHDL 6 04/26/2021 1201   LDLDIRECT 34.0 04/26/2021 1201    Pharmacist Clinical Goal(s): Over the next 90 days, patient will work with PharmD and providers to achieve LDL goal < 100 and Triglycerides <150 Current regimen:  Aspirin 81mg  daily Niaspan CR 1000mg  at bedtime (lowers triglycerides and can increase HDL) Salmon Oil 1000mg  twice daily per [facility] (lowers triglycerides and can increase HDL) Praluent 75mg  every 14 days (lowers LDL mostly be also can lower triglycerides) Interventions: Discussed lipid goals and risk associated with elevated triglycerides Reviewed foods that can increase triglycerides; continue to limit intake of alcohol, sweets / sugar, potatoes, bread, rice and pasta Approved for $2500 from [facility] for 2023 for hypercholesterolemia Patient self care activities - Over the next 90 days, patient will: Maintain cholesterol medication regimen.  Limit intake of foods that can increase triglycerides Encouraged increased physical activity Restart Praluent ASAP  Diabetes Lab Results  Component Value Date/Time   HGBA1C 8.7 (H) 08/16/2021 10:58 AM   HGBA1C 9.7 (H) 04/26/2021 12:01 PM   HGBA1C 8.2 08/12/2018 12:00 AM  A1c at Dr Theodosia Blender office was 10.6% on 02/10/2021  Pharmacist Clinical Goal(s): Over the next 90 days, patient will work with PharmD and providers to achieve A1c goal <7% Current regimen:  Ozempic - inject 1.5mg  weekly Metformin XR 500mg  - take 2 tablets = 1000mg  twice a day with morning and evening meals Interventions: Discussed A1c and blood glucose goals Reviewed home blood glucose readings and reviewed goals  Fasting blood glucose goal (before meals) = 80 to 130 Blood glucose goal after a meal =  less than 180  Patient self care activities - Over the next 90 days, patient will: Check blood sugar once daily, document, and provide at future appointments Limit intake of  high carbohyrates foods and sugar - continue with low carbohydrate diet Continue to increase physical activity - goal is to work up to at least 150 minutes per week.  Contact provider with any episodes of hypoglycemia or blood glucose over 250 or less than 80  Prostate Health /  elevated PSA; Interventions: Follow up with urology clinic Patient self care activities - Over the next 30 days, patient will: Continue to follow up with urology clinic   Osteoarthritis Pharmacist Clinical Goal(s) Over the next 90 days, patient will work with PharmD and providers to reduce symptoms associated with arthritis Current regimen:  Alpha lipoic acid 600mg  daily  Diclofenac 75mg  up to twice a day if needed for pain Patient self care activities - Over the next 90 days, patient will: Maintain medication regimen for arthritis    GERD Pharmacist Clinical Goal(s) Over the next 90 days, patient will work with PharmD and providers to reduce symptoms associated with GERD Current regimen:  Famotidine 20mg  twice daily Omeprazole 40mg  daily at bedtime Patient self care activities - Over the next 90 days, patient will: Take omeprazole in morning on empty stomach and take famotidine at bedtime  Health Maintenance:  Reviewed vaccination history and discussed benefits of annual flu vaccine and COVID Booster Patient to get the following vaccines at next provider appointment, local pharmacy or [facility]: flu vaccine and COVID booster  Medication management Pharmacist Clinical Goal(s): Over the next 90 days, patient will work with PharmD and providers to maintain optimal medication adherence Current pharmacy: [facility] and [facility] Interventions Comprehensive medication review performed. Continue current medication management strategy Verified patient has reduced vitamin B12 supplement to every other day as recommended by Dr Lorelei Pont after last labs. Patient self care  activities - Over the next 90 days, patient will: Focus on medication adherence by filling and taking medications appropriately  Take medications as prescribed Report any questions or concerns to PharmD and/or provider(s)  Patient Goals/Self-Care Activities Over the next 180 days, patient will: take medications as prescribed check glucose daily , document, and provide at future appointments,  check blood pressure 1 to 3 times per week, document, and provide at future appointments Continue losartan hydrochlorothiazide 100/25mg  take 1 tablet each morning Start spironolactone 25mg  - 0.5 tablet daily each morning.   Follow Up Plan: Telephone follow up appointment with care management team member scheduled for:  6 weeks; will see PCP 11/15/2021 Patient verbalizes understanding of instructions and care plan provided today and agrees to view in [place]. Active MyChart status confirmed with patient.

## 2021-10-23 NOTE — Chronic Care Management (AMB) (Signed)
Chronic Care Management Pharmacy Note  10/23/2021 Name:  Timothy Chapman MRN:  [id number] DOB:  1946-06-24  Summary: Patient reports blood pressure was elevated at last appointment with urology clinic - was 188/84. He also reports he has gotten elevated blood pressure readings at home of 150 - 180 / 70 - 85.  Patient restarted losartan hydrochlorothiazide 100/47m daily on his own and lower dose dose of benazepril to 421mat night. Still having systolic blood pressure reading in 150 to 160's and sometimes in 180's.  Discussed starrting low dose CCB but patient declined due to past experience with amlodipine. Will consult with PCP - could add spironolactone 12.49m8maily.  He will also be leaving for 2 months to go to the [place]. Appt set up with PCP for 11/15/2021 for blood pressure and labs.  A1c improving but still above goal. He has been able to increase Ozempic a little and is taking 1.49mg53mekly (was unable to tolerate 2mg 56mly).    Following low CHO diet with weight loss of about 30 lbs over the last 6 months.  Praluent was approved for $2500 assistance thru the [facility]aged patient to fill ASAP   Subjective: Timothy Chapman 75 y.76 year old male who is a primary patient of Copland, JessiGay Filler  The CCM team was consulted for assistance with disease management and care coordination needs.    Engaged with patient by telephone for follow up visit in response to provider referral for pharmacy case management and/or care coordination services.   Consent to Services:  The patient was given information about Chronic Care Management services, agreed to services, and gave verbal consent prior to initiation of services.  Please see initial visit note for detailed documentation.   Patient Care Team: Copland, JessiGay Filleras PCP - General (Family Medicine) EckarCherre Robins-CPP (Pharmacist)  Recent office visits: 08/17/2021 - Patient Message - Dr  CoplaLorelei Pontouraged adherence to Praulent and Niacin due to elevated Tg and LDL. Recommended limiting intake of food high in potassium 08/16/2021 - Fam Med (Dr CoplaLorelei Pont HTN and DM. Noted left lateral abdominal pain. CT of abdomen ordered.  04/26/21 JessiLamar Blinks Vertigo/dizziness - labs ordered - referred to community neurology for evaluation. Patient advised after labs returned to restart Praluent and decreaes B12 supplement to every OTHER day 03/29/2021 - phone call - reported metallic taste with Paxlovid; recommended he stop 03/28/2021 - Dr WesleJoyce GrossVID positive on 03/24/2021. Prescribed Paxlovid 300mg 29mfor 5 days and benzonatate 200mg u19m tid prn for 14 days.   Recent consult visits: 07/13/2021 [facility] clinically significant focal prostate cancer. Attention on followup as indicated.  06/28/2021 - Neurology (Dr Chima) Billey Goslingation of neuropathy and falls.He has tried multiple treatments for neuropathy previously and is not interested in trying any more medications. Checked labs and recommended continue physical therapy.  06/27/2021 - [facility] BRVO with macular edema of left eye. Received Eylea injection in left eye 06/21/2021 - Urology ([facility]) f/u prostate cancer and his elevated serum PSA value. I will repeat PSA today. Ordered repeat MRI. Also discussed repeat prostate biopsy given his history of prostate cancer.  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 Good response to Eylea. Received #13 OVE of left eyee at this visit. F/U in  8 weeks for reevluation and IVE. 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 Stressed better BG control with goal of A1c < 7.0. Monitor cataract of both eyes.   Hospital visits: None in previous 6 months  Objective:  Lab Results  Component Value Date    CREATININE 1.43 08/16/2021   CREATININE 1.44 04/26/2021   CREATININE 1.20 (H) 07/06/2020    Lab Results  Component Value Date   HGBA1C 8.7 (H) 08/16/2021   Last diabetic Eye exam:  Lab Results  Component Value Date/Time   HMDIABEYEEXA Retinopathy (A) 06/27/2021 12:00 AM    Last diabetic Foot exam: No results found for: HMDIABFOOTEX      Component Value Date/Time   CHOL 175 08/16/2021 1058   TRIG (H) 08/16/2021 1058    475.0 Triglyceride is over 400; calculations on Lipids are invalid.   HDL 33.30 (L) 08/16/2021 1058   CHOLHDL 5 08/16/2021 1058   VLDL 57.0 (H) 07/09/2017 0826   LDLCALC  07/06/2020 1448     Comment:     . LDL cholesterol not calculated. Triglyceride levels greater than 400 mg/dL invalidate calculated LDL results. . Reference range: <100 . Desirable range <100 mg/dL for primary prevention;   <70 mg/dL for patients with CHD or diabetic patients  with > or = 2 CHD risk factors. Marland Kitchen LDL-C is now calculated using the Martin-Hopkins  calculation, which is a validated novel method providing  better accuracy than the Friedewald equation in the  estimation of LDL-C.  Cresenciano Genre et al. Annamaria Helling. 9509;326(71): 2061-2068  (http://education.Quest[employer]ostics.com/faq/FAQ164)    LDLDIRECT 106.0 08/16/2021 1058    Hepatic Function Latest Ref Rng & Units 08/16/2021 06/28/2021 04/26/2021  Total Protein 6.0 - 8.3 g/dL 7.3 7.9 7.5  Albumin 3.5 - 5.2 g/dL 4.3 - 4.2  AST 0 - 37 U/L 26 - 32  ALT 0 - 53 U/L 32 - 33  Alk Phosphatase 39 - 117 U/L 36(L) - 48  Total Bilirubin 0.2 - 1.2 mg/dL 0.4 - 0.4  Bilirubin, Direct 0.1 - 0.5 mg/dL - - -    No results found for: TSH, FREET4  CBC Latest Ref Rng & Units 04/26/2021 07/06/2020 05/28/2019  WBC 4.0 - 10.5 K/uL 6.9 8.9 7.2  Hemoglobin 13.0 - 17.0 g/dL 12.1(L) 13.3 12.2(L)  Hematocrit 39.0 - 52.0 % 36.5(L) 40.7 37.5(L)  Platelets 150.0 - 400.0 K/uL 232.0 247 276.0    No results found for: VD25OH  Clinical ASCVD: Yes  The  10-year ASCVD risk score (Arnett DK, et al., 2019) is: 76%   Values used to calculate the score:     Age: 45 years     Sex: Male     Is Non-Hispanic African American: No     Diabetic: Yes     Tobacco smoker: Yes     Systolic Blood Pressure: 245 mmHg     Is BP treated: Yes     HDL Cholesterol: 33.3 mg/dL     Total Cholesterol: 175 mg/dL     Social History   Tobacco Use  Smoking Status Former   Types: Cigarettes   Quit date: 09/18/1975   Years since quitting: 46.1  Smokeless Tobacco Never   BP Readings from Last 3 Encounters:  08/16/21 130/84  06/28/21 (!) 142/84  04/26/21 124/80   Pulse Readings from Last 3 Encounters:  08/16/21 68  06/28/21 64  04/26/21 87   Wt Readings from Last 3 Encounters:  08/16/21 237 lb 12.8 oz (107.9 kg)  06/28/21  232 lb (105.2 kg)  04/26/21 236 lb (107 kg)    Assessment: Review of patient past medical history, allergies, medications, health status, including review of consultants reports, laboratory and other test data, was performed as part of comprehensive evaluation and provision of chronic care management services.   SDOH:  (Social Determinants of Health) assessments and interventions performed:     CCM Care Plan  Allergies  Allergen Reactions   Codeine     hallucinations   Farxiga [Dapagliflozin] Other (See Comments)    Genital infection and irritation   Statins     Has tried several cannot tolerate    Medications Reviewed Today     Reviewed by Cherre Robins, RPH-CPP (Pharmacist) on 10/23/21 at 1412  Med List Status: <None>   Medication Order Taking? Sig Documenting Provider Last Dose Status Informant  Acetylcarnitine HCl 500 MG CAPS [id number] Yes Take 500 mg by mouth daily. [provider] Taking Active Self           Med Note Rosemarie Beath, MELISSA B   Thu Nov 21, 2017 12:03 PM)    Alcohol Swabs (B-D SINGLE USE SWABS REGULAR) PADS [id number] Yes USE PRIOR TO INSULIN DOSE Copland, Gay Filler, MD Taking Active   Alirocumab  (PRALUENT) 75 MG/ML SOAJ [id number] No INJECT $RemoveB'75MG'eKAgMyRp$  INTO THE SKIN EVERY 14 DAYS  Patient not taking: Reported on 10/23/2021   Copland, Gay Filler, MD Not Taking Active   Alpha-Lipoic Acid 600 MG CAPS [id number] Yes Take 600 mg by mouth daily. [provider] Taking Active Self           Med Note Antony Contras, Elliot Simoneaux B   Tue Apr 04, 2021  1:16 PM)    aspirin 81 MG chewable tablet [id number] Yes Chew 1 tablet (81 mg total) by mouth daily. Shelly Coss, MD Taking Active Self  benazepril (LOTENSIN) 40 MG tablet [id number] Yes Take 1 tablet (40 mg total) by mouth in the morning and at bedtime.  Patient taking differently: Take 40 mg by mouth daily.   Copland, Gay Filler, MD Taking Active   diclofenac (VOLTAREN) 75 MG EC tablet [id number] Yes Take one BID prn joint pain Copland, Gay Filler, MD Taking Active   famotidine (PEPCID) 20 MG tablet [id number] Yes Take 1 tablet (20 mg total) by mouth 2 (two) times daily. Copland, Gay Filler, MD Taking Active            Med Note Theodosia Blender Apr 04, 2021  1:18 PM) Taking as needed  Lancets (ONETOUCH DELICA PLUS [id number]) Point [id number] Yes USE TWO TIMES A DAY TO     CHECK BLOOD SUGAR Copland, Gay Filler, MD Taking Active   losartan-hydrochlorothiazide (HYZAAR) 100-25 MG tablet [id number] Yes Take 1 tablet by mouth daily. [provider] Taking Active   melatonin 5 MG TABS [id number] Yes Take 5 mg by mouth at bedtime.  [provider] Taking Active Self  metFORMIN (GLUCOPHAGE-XR) 500 MG 24 hr tablet [id number] Yes Take 2 tablets (1,000 mg total) by mouth 2 (two) times daily after a meal. Take with food Copland, Gay Filler, MD Taking Active   Multiple Vitamin (MULTIVITAMIN) tablet [id number] Yes Take 1 tablet by mouth daily. [provider] Taking Active Self  niacin (NIASPAN) 1000 MG CR tablet [id number] No Take 1,000 mg by mouth at bedtime.  Patient not taking: Reported on 10/23/2021   [provider] Not Taking Active    omeprazole (PRILOSEC) 40 MG capsule [id number] Yes Take 1 capsule (40  mg total) by mouth daily. Copland, Gay Filler, MD Taking Active   [facility] VERIO test strip [id number] Yes USE AS INSTRUCTED Copland, Gay Filler, MD Taking Active   OVER THE COUNTER MEDICATION [id number] Yes Singing nettle daily for prostate [provider] Taking Active   Semaglutide, 2 MG/DOSE, (OZEMPIC, 2 MG/DOSE,) 8 MG/3ML SOPN [id number] Yes Inject 1.5 mg into the skin once a week. [provider] Taking Active   tadalafil (CIALIS) 10 MG tablet [id number] Yes Take 1 tablet (10 mg total) by mouth daily as needed for erectile dysfunction. Max 20 mg total per day Copland, Gay Filler, MD Taking Active   tadalafil (CIALIS) 5 MG tablet [id number] Yes Take 1 tablet (5 mg total) by mouth daily. Copland, Gay Filler, MD Taking Active   vitamin C (ASCORBIC ACID) 500 MG tablet [id number] Yes Take 500 mg by mouth daily. 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   Paroxysmal atrial fibrillation ([place]) 12/16/2017   SBO (small bowel obstruction) ([place]) 10/08/2017   ATN (acute tubular necrosis) ([place]) 10/08/2017   Septic shock ([place]) 10/04/2017   Acute cholecystitis s/p perc cholecystomy drainage 10/04/2017    Acute respiratory failure with hypoxia ([facility])    Chronic back pain 02/08/2016   Controlled type 2 diabetes mellitus with diabetic neuropathy, without long-term current use of insulin ([place]) 02/08/2016   Insomnia 02/08/2016   History of diverticulitis 02/08/2016   HYPERTENSION, BENIGN ESSENTIAL, UNCONTROLLED 03/12/2009   ABNORMAL ELECTROCARDIOGRAM 03/12/2009    Immunization History  Administered Date(s) Administered   Fluad Quad(high Dose 65+) 05/28/2019, 07/06/2020   Influenza, High Dose Seasonal PF 06/20/2016, 07/08/2017   Influenza-Unspecified 08/12/2021   PFIZER Comirnaty(Gray Top)Covid-19 Tri-Sucrose Vaccine  11/15/2019, 12/15/2019, 09/02/2020   PFIZER(Purple Top)SARS-COV-2 Vaccination 11/15/2019, 12/15/2019   Pfizer Covid-19 Vaccine Bivalent Booster 20yr & up 08/16/2021   Pneumococcal Conjugate-13 06/20/2016   Pneumococcal Polysaccharide-23 02/06/2018, 05/28/2019   Pneumococcal-Unspecified 12/20/2011, 05/18/2012   Tdap 09/18/2011, 10/31/2011   Zoster Recombinat (Shingrix) 02/19/2020, 05/03/2020   Conditions to be addressed/monitored: HTN, HLD, DMII, GERD, BPH, and arthritis pain; ED  Care Plan : General Pharmacy (Adult)  Updates made by ECherre Robins RPH-CPP since 10/23/2021 12:00 AM     Problem: Chronic Disease Management support, education, and care coordination needs related to HTN, Hyperlipidemia, Diabetes, Insomnia, GERD, Pain, BPH/ED   Note:   Current Barriers:  Unable to maintain control of type 2 DM Does not notify physician's office if has questions or concerns In need of Chronic Disease Management support, education, and care coordination related to Hypertension, Hyperlipidemia, Diabetes, GERD (with Barrett's Esophagus); NASH; Pain, BPH/ED History of low adherence to maintenance medications Unable to afford therapy for hyperlipidemia (Praluent)   Pharmacist Clinical Goal(s):  Over the next 180 days, patient will verbalize ability to afford treatment regimen achieve adherence to monitoring guidelines and medication adherence to achieve therapeutic efficacy achieve control of diabetes and hypertension as evidenced by A1c < 7.0% and BP <140/90 adhere to prescribed medication regimen as evidenced by refill history and meeting of goal mentioned below  through collaboration with PharmD and provider.  Better understand medication regimen and improve adherence to medication therapy Maintain communication with PCP's office   Interventions: 1:1 collaboration with Copland, JGay Filler MD regarding development and update of comprehensive plan of care as evidenced by provider attestation  and co-signature Inter-disciplinary care team collaboration (see longitudinal plan of  care) Comprehensive medication review performed; medication list updated in electronic medical record  Diabetes: Uncontrolled - A1c goal <7.0% A1c is not at goal but improved from 10.6% 02/10/2021 to 9.7% 04/26/2021 and 8.7% 07/2021 Current regimen:  Ozempic - inject 1.64m weekly Metformin XR 5025m- take 2 tablets = 100042mwice a day with morning and evening meals Was unable to tolerate FarIrancaused irritation and infection of genitals; Rybelsus - cost and more nasuea than Ozempic Also was unable to tolerated higher Ozempic dose 2mg49mekly - caused nausea so he restarted 1mg 61me. Today he reports that he has increased dose to 1.5mg w74mly and has been tolerating well.  Had one episode of nausea [facility] was high normal at 4.06 Patient reports recent home BG: ranging from 110 to 200 (increased over the holidays)  Denies BG <80 / hypoglycemic symptoms [facility] 07/06/2020 was 253; weight 08/16/2021 was 237 (weight loss of 22 lbs). Reviewed diet - patient states he has been following low carb/ keto type diet with intermittent fasting but did not follow as closely during holidays.  Exercise: using treadmill a few times per week Interventions: Discussed A1c and blood glucose goals Reviewed home blood glucose readings and reviewed goals  Fasting blood glucose goal (before meals) = 80 to 130 Blood glucose goal after a meal = less than 180  Continue with current medication. If A1c still > 8.0 at next visit, consider long acting insulin addition or Mounjaro (though will need to check coverage through [facility]) [place] he only received 2 pens of Ozempic [facility] with last [facility]ill and he was running out before 3 months. Discussed that there have been supply issues with Ozempic at the end of 2022 and [place] been [place] amount  they provided per patient. Recommended patient call [facility] to check to see if he could request refill.  Continue to follow low carbohydrate diet and exercise at least 150 minutes per week.   Hypertension: BP Readings from Last 3 Encounters:  08/16/21 130/84  06/28/21 (!) 142/84  04/26/21 124/80  Blood pressure at goal at last office visit. However patient states blood pressure elevated with SBP 170's at visit with Dr Shah aManuella Ghazias 188/84 at his appointment with urology clinic.  Goal BP <140/90 Current regimen:  Benazepril 40mg t39m a day (only taking once at night) Losartan hydrochlorothiazide 100/25mg ea41morning (Patient restarted himself due to recent increased blood pressure at home and urology clinic)  Home blood pressure today per patient have increased - 155 to 180 / 70 to 85 Previous medications tried: amlodipine - stopped due to dizziness; losartan-hydrochlorothiazide - patient concerned for hydrochlorothiazide side effects and did not feel that he needed it because his "ankles are skinny and shriveled up" Patient reports he was falling a lot when he was taking amlodipine; [place]ce [facility] amlodipine in May and patient reports he is no longer falling.  He saw neurology for dizziness / neuropathy 06/2021. No cause of dizziness noted. No further follow up recommended unless patient would like to pursue treatment of neuropathy.  Interventions: Recommended patient check blood pressure 1 to 3 times per week, document, and provide at future appointments  Recommended stop benazepril (not recommended to take both benazepril and losartan)  Discussed other options like adding back low dose calcium channel blocker but patient refused due to concerns of dizziness.  Consider spironolactone 12.5mg21m  daily - will consult with PCP. Discussed blood pressure goal Discussed diet and exercise Ensure daily salt intake < 2300 mg/day RTC in 2 to 3 weeks to check  CMP  Hyperlipidemia with elevated triglycerides LDL nor triglycerides at goal;  Triglycerides have improved from 815 to 475. HDLC also improaved form 25 to 33.  LDL goal < 100 and Triglycerides <150 Current regimen:  Niaspan CR 1022m at bedtime - take aspirin 813mprior to niaspan to decrease flushing [place] 100062mwice daily [place] 22[place]59[place] 14 days (has not taken in 5 months due to cost) Patient has VA benefits - contacted VA to see if Praluent is on formulary. Unfortunately needs prior [place] from [facility] [place]. Patient states he tried to discuss with his [facility] [place] at last visit but did not have time to address.  I did send application for Praluent medication assistance in 2022 but patient has not complete.  Patient denied alcohol use Interventions: Discussed lipid goals and risk associated with elevated trilycerides Patient was approved for $2500 from 08/28/2021 thru 08/27/2022 from [facility] has not filled Praulent. Encouraged patient to fill ASAP. Reviewed foods that can increase triglycerides; encouraged patient to continue to limit intake of alcohol, sweets / sugar, potatoes, bread, rice and pasta Encouraged increased physical activity  Chronic Back Pain / Osteoarthritis Controlled Current regimen:  Alpha lipoic acid 600mg21mly  Diclofenac 22mg 17mo twice a day if needed for pain Patient self care activities - Over the next 90 days, patient will: Maintain medication regimen for arthritis    GERD Controlled Current regimen:  Famotidine 20mg t49m daily Omeprazole 40mg da53mat bedtime Patient self care activities - Over the next 90 days, patient will: Take omeprazole in morning on empty stomach and take famotidine at bedtime  Insomnia:  Current Regimen:  Melatonin 5mg qhs 28mient reports he is sleeping fairly well at night.  He does have a 1 year ol92 at home who is not sleeping through the night most nights.  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  Interventions: none Continue current regimen for sleep  BPH with elevated PSA; Patient has seen urologist regarding elevated; Had MRI of prostate 07/13/2021 - radiology ([facility] no evidence of a clinically significant focal prostate cancer. Attention on followup as indicated.  [facility]  Health Maintenance:  No current need but tetanus vaccine will been updating after 10/2021  Medication management [facility] some [facility] Interventions Comprehensive medication review performed. Medication list updated Reviewed refill history and discussed adherence with patient. Continue current medication management strategy    Patient Goals/Self-Care Activities Over the next 180 days, patient will:  take medications as prescribed, check glucose daily , document, and provide at future appointments, check blood pressure 1 to 3 times per week, document, and provide at future appointments, and collaborate with provider on medication access solutions  Follow Up Plan: Telephone follow up appointment with care management team member scheduled for:  6 weeks  ; Patient to follow up with PCP 11/15/2021  Patient [place].            Medication Assistance: States he cannot afford Praluent - has not taken in 4 months. Applied for [employer] [place] with Praluent and any medication for hypercholesterolemia. Patient was approved for $2500 from 08/28/2021 thru 08/27/2022  Patient's preferred pharmacy is:  [place], [place] - [address] [place] [address] Phone: [phone] Fax: [phone]  [facility] [address] [place] [address] Phone: [phone] Fax: [phone]  [facility] [place] [place] [facility] [address] [address] [place] [address] Phone: [phone] Fax: [place] [facility] [phone] - [place], [place] - [address] [place] [address] Phone: [phone] Fax: [phone]  [id number] [place] [place] - [id number] [id number]: [phone]: [phone]  Follow Up:  Patient agrees to Care Plan and Follow-up.  Plan: Telephone follow up appointment with care management team member scheduled for:  1 to 2 months  TCherre Robins PharmD Clinical Pharmacist [facility]

## 2021-10-24 ENCOUNTER — Telehealth: Payer: Self-pay

## 2021-10-24 ENCOUNTER — Encounter: Payer: Self-pay | Admitting: Family Medicine

## 2021-10-24 NOTE — Telephone Encounter (Signed)
PA initiated via Covermymeds; KEY: [id number]. Awaiting determination.

## 2021-10-24 NOTE — Chronic Care Management (AMB) (Signed)
Discussed blood pressure with Dr Lorelei Pont. Recommended patient visit to check CMP since potassium was elevated in November 2022/

## 2021-10-24 NOTE — Telephone Encounter (Signed)
PA approved. Effective 09/17/2021 to 09/16/2022.

## 2021-11-12 NOTE — Patient Instructions (Addendum)
It was good to see you again today, please continue to keep an eye on your home BP ? ?Have a wonderful time on your trip!   ? ?Take the oral typhoid vaccine asap ?Start doxycycline for malaria prophylaxis 1-2 days prior to arrival and continue for 4 weeks once home ? ?I will be in touch with your labs ?Assuming all is ok please see me in 4-6 months  ?

## 2021-11-12 NOTE — Progress Notes (Signed)
[facility] [place], [place], [place] [address] [phone]  Date:  11/15/2021   Name:  Timothy Chapman [facility]   DOB:  11-Dec-1945   MRN:  [id number]  PCP:  Timothy Mclean, MD    Chief Complaint: Follow-up (Follow up htn/Concerns/ questions: pt says he was unablt to tolerate the Losartan-hctz/Tdap: none in ncir)   History of Present Illness:  Timothy Chapman is a 76 y.o. very pleasant male patient who presents with the following:  Patient seen today for follow-up Last visit with myself was in November  History of diabetes, hypertension, chronic back pain, severe illness in 2019-he became ill with sepsis due to cholangitis, had acute respiratory failure and required intubation He receives some care through the [place] History of prostate cancer currently on active surveillance  Married to Timothy Chapman, their son is 52-year-old  Tetanus booster-he did have a Tdap in 2013.  Can update tetanus today Seen by urology for PSA in January  Praluent Baby aspirin Benazepril ?  Also taking losartan/HCTZ- he had to stop taking this due to SE, he felt sedated  Metformin 1000 twice daily Ozempic- he is up to 2 mg now, tolerating well   He notes his home BP running about 120-130/80  Traveling to the [place] on 3/27- will be there just short of 2 months  We discussed possible medications needed for travel.  Patient thinks he has already been vaccinated against hepatitis a and B.  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   Paroxysmal atrial fibrillation ([place]) 12/16/2017    SBO (small bowel obstruction) ([place]) 10/08/2017   ATN (acute tubular necrosis) ([place]) 10/08/2017   Septic shock ([place]) 10/04/2017   Acute cholecystitis s/p perc cholecystomy drainage 10/04/2017    Acute respiratory failure with hypoxia (HCC)    Chronic back pain 02/08/2016   Controlled type 2 diabetes mellitus with diabetic neuropathy, without long-term current use of insulin ([place]) 02/08/2016   Insomnia 02/08/2016   History of diverticulitis 02/08/2016   HYPERTENSION, BENIGN ESSENTIAL, UNCONTROLLED 03/12/2009   ABNORMAL ELECTROCARDIOGRAM 03/12/2009    Past Medical History:  Diagnosis Date   AKI (acute kidney injury) ([place])    Anxiety    Arthritis    Barrett esophagus    Depression    Diabetes mellitus without complication ([place])    type 2   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   Cannot tolerate statins   GERD (gastroesophageal reflux disease)    Hypertension    Neuromuscular disorder (HCC)    neuropathy but being treated   PONV (postoperative nausea and vomiting)    Sleep apnea    has a sleep study but refuses that he has sleep study, not wearing CPAP    Past Surgical History:  Procedure Laterality Date   BACK SURGERY     X2 - lower   CHOLECYSTECTOMY N/A 12/20/2017   Procedure: LAPAROSCOPIC CHOLECYSTECTOMY WITH INTRAOPERATIVE CHOLANGIOGRAM POSSIBLE OPEN;  Surgeon: Fanny Skates, MD;  Location: [facility];  Service: General;  Laterality: N/A;   COLONOSCOPY     ESOPHAGOGASTRODUODENOSCOPY  IR RADIOLOGIST EVAL & MGMT  10/30/2017   IR RADIOLOGIST EVAL & MGMT  11/14/2017   PROSTATE SURGERY  2017   uro lift per pt.   REPLACEMENT TOTAL KNEE Left    ROTATOR CUFF REPAIR     Bil    Social History   Tobacco Use   Smoking status: Former    Types: Cigarettes    Quit date: 09/18/1975    Years since quitting: 46.1   Smokeless tobacco: Never  Vaping Use   Vaping Use: Never used  Substance Use Topics   Alcohol use: Not Currently    Alcohol/week: 0.0 standard drinks    Comment: Rarely    Drug use: Never    Family History  Problem Relation Age of Onset   Heart failure Father    Heart disease Father    Emphysema Mother    Colon cancer Maternal Grandmother     Allergies  Allergen Reactions   Codeine     hallucinations   Farxiga [Dapagliflozin] Other (See Comments)    Genital infection and irritation   Statins     Has tried several cannot tolerate    Medication list has been reviewed and updated.  Current Outpatient Medications on File Prior to Visit  Medication Sig Dispense Refill   Acetylcarnitine HCl 500 MG CAPS Take 500 mg by mouth daily.     Alcohol Swabs (B-D SINGLE USE SWABS REGULAR) PADS USE PRIOR TO INSULIN DOSE 200 each 7   Alirocumab (PRALUENT) 75 MG/ML SOAJ INJECT 75MG  INTO THE SKIN EVERY 14 DAYS 2 mL 11   Alpha-Lipoic Acid 600 MG CAPS Take 600 mg by mouth daily.     aspirin 81 MG chewable tablet Chew 1 tablet (81 mg total) by mouth daily. 30 tablet 0   benazepril (LOTENSIN) 40 MG tablet Take 1 tablet (40 mg total) by mouth in the morning and at bedtime. (Patient taking differently: Take 40 mg by mouth 2 (two) times daily.) 180 tablet 3   diclofenac (VOLTAREN) 75 MG EC tablet Take one BID prn joint pain 180 tablet 3   famotidine (PEPCID) 20 MG tablet Take 1 tablet (20 mg total) by mouth 2 (two) times daily. 180 tablet 3   Lancets (ONETOUCH DELICA PLUS [id number]) MISC USE TWO TIMES A DAY TO     CHECK BLOOD SUGAR 100 each 3   melatonin 5 MG TABS Take 5 mg by mouth at bedtime.      metFORMIN (GLUCOPHAGE-XR) 500 MG 24 hr tablet Take 2 tablets (1,000 mg total) by mouth 2 (two) times daily after a meal. Take with food 360 tablet 3   Multiple Vitamin (MULTIVITAMIN) tablet Take 1 tablet by mouth daily.     niacin (NIASPAN) 1000 MG CR tablet Take 1,000 mg by mouth at bedtime.     omeprazole (PRILOSEC) 40 MG capsule Take 1 capsule (40 mg total) by mouth daily. 90 capsule 3   ONETOUCH VERIO test strip USE AS INSTRUCTED 100 strip 7   OVER THE COUNTER MEDICATION  Singing nettle daily for prostate     Semaglutide, 2 MG/DOSE, (OZEMPIC, 2 MG/DOSE,) 8 MG/3ML SOPN Inject 1.5 mg into the skin once a week.     tadalafil (CIALIS) 10 MG tablet Take 1 tablet (10 mg total) by mouth daily as needed for erectile dysfunction. Max 20 mg total per day 90 tablet 2   tadalafil (CIALIS) 5 MG tablet Take 1 tablet (5 mg total) by mouth daily. 90 tablet 2   vitamin C (  ASCORBIC ACID) 500 MG tablet Take 500 mg by mouth daily.     No current facility-administered medications on file prior to visit.    Review of Systems:  As per HPI- otherwise negative.   Physical Examination: Vitals:   11/15/21 1022  BP: (!) 142/88  Pulse: 97  Resp: 18  Temp: 97.8 F (36.6 C)  SpO2: 99%   Vitals:   11/15/21 1022  Weight: 240 lb (108.9 kg)  Height: 5\' 9"  (1.753 m)   Body mass index is 35.44 kg/m. Ideal Body Weight: Weight in (lb) to have BMI = 25: 168.9  GEN: no acute distress.  Obese, looks well HEENT: Atraumatic, Normocephalic.  Ears and Nose: No external deformity. CV: RRR, No M/G/R. No JVD. No thrill. No extra heart sounds. PULM: CTA B, no wheezes, crackles, rhonchi. No retractions. No resp. distress. No accessory muscle use.  EXTR: No c/c/e PSYCH: Normally interactive. Conversant.  Patient has an abrasion/wound on his left shin.  It is healing normally, update tetanus  Assessment and Plan: Uncontrolled type 2 diabetes mellitus with hyperglycemia, without long-term current use of insulin (HCC) - Plan: Hemoglobin A1c  Dyslipidemia  Hyperkalemia - Plan: Basic metabolic panel  HYPERTENSION, BENIGN ESSENTIAL, UNCONTROLLED - Plan: Basic metabolic panel, CBC  Wound of left lower extremity, initial encounter - Plan: Td vaccine greater than or equal to 7yo preservative free IM  Travel advice encounter - Plan: doxycycline (VIBRAMYCIN) 100 MG capsule, typhoid (VIVOTIF) DR capsule  Gershon Mussel is seen today for follow-up.  He plans to visit the [place] with his family for  7 weeks coming up soon Prescribed typhoid oral vaccine and doxycycline and malaria prophylaxis and explained use Update tetanus today Patient notes good control blood pressure when he checks it at home, which she does regularly Will plan further follow- up pending labs.    Signed Lamar Blinks, MD  Received labs as below, message to patient He is currently up to Ozempic 2 mg so we are a bit surprised his A1c is worse Renal function is stable Negative colonoscopy 2019 Mild anemia is stable  Results for orders placed or performed in visit on 11/15/21  Hemoglobin A1c  Result Value Ref Range   Hgb A1c MFr Bld 9.0 (H) 4.6 - 6.5 %  Basic metabolic panel  Result Value Ref Range   Sodium 134 (L) 135 - 145 mEq/L   Potassium 5.1 3.5 - 5.1 mEq/L   Chloride 104 96 - 112 mEq/L   CO2 20 19 - 32 mEq/L   Glucose, Bld 158 (H) 70 - 99 mg/dL   BUN 26 (H) 6 - 23 mg/dL   Creatinine, Ser 1.23 0.40 - 1.50 mg/dL   GFR 58.33 (L) >60.00 mL/min   Calcium 10.1 8.4 - 10.5 mg/dL  CBC  Result Value Ref Range   WBC 7.3 4.0 - 10.5 K/uL   RBC 4.47 4.22 - 5.81 Mil/uL   Platelets 293.0 150.0 - 400.0 K/uL   Hemoglobin 12.3 (L) 13.0 - 17.0 g/dL   HCT 38.2 (L) 39.0 - 52.0 %   MCV 85.4 78.0 - 100.0 fl   MCHC 32.3 30.0 - 36.0 g/dL   RDW 14.2 11.5 - 15.5 %

## 2021-11-14 DIAGNOSIS — E785 Hyperlipidemia, unspecified: Secondary | ICD-10-CM

## 2021-11-14 DIAGNOSIS — E119 Type 2 diabetes mellitus without complications: Secondary | ICD-10-CM

## 2021-11-14 DIAGNOSIS — I10 Essential (primary) hypertension: Secondary | ICD-10-CM | POA: Diagnosis not present

## 2021-11-15 ENCOUNTER — Encounter: Payer: Self-pay | Admitting: Family Medicine

## 2021-11-15 ENCOUNTER — Ambulatory Visit (INDEPENDENT_AMBULATORY_CARE_PROVIDER_SITE_OTHER): Payer: Medicare HMO | Admitting: Family Medicine

## 2021-11-15 VITALS — BP 142/88 | HR 97 | Temp 97.8°F | Resp 18 | Ht 69.0 in | Wt 240.0 lb

## 2021-11-15 DIAGNOSIS — Z7184 Encounter for health counseling related to travel: Secondary | ICD-10-CM

## 2021-11-15 DIAGNOSIS — I10 Essential (primary) hypertension: Secondary | ICD-10-CM | POA: Diagnosis not present

## 2021-11-15 DIAGNOSIS — S81802A Unspecified open wound, left lower leg, initial encounter: Secondary | ICD-10-CM | POA: Diagnosis not present

## 2021-11-15 DIAGNOSIS — E875 Hyperkalemia: Secondary | ICD-10-CM | POA: Diagnosis not present

## 2021-11-15 DIAGNOSIS — E1165 Type 2 diabetes mellitus with hyperglycemia: Secondary | ICD-10-CM | POA: Diagnosis not present

## 2021-11-15 DIAGNOSIS — E785 Hyperlipidemia, unspecified: Secondary | ICD-10-CM

## 2021-11-15 DIAGNOSIS — Z23 Encounter for immunization: Secondary | ICD-10-CM

## 2021-11-15 LAB — CBC
HCT: 38.2 % — ABNORMAL LOW (ref 39.0–52.0)
Hemoglobin: 12.3 g/dL — ABNORMAL LOW (ref 13.0–17.0)
MCHC: 32.3 g/dL (ref 30.0–36.0)
MCV: 85.4 fl (ref 78.0–100.0)
Platelets: 293 10*3/uL (ref 150.0–400.0)
RBC: 4.47 Mil/uL (ref 4.22–5.81)
RDW: 14.2 % (ref 11.5–15.5)
WBC: 7.3 10*3/uL (ref 4.0–10.5)

## 2021-11-15 LAB — HEMOGLOBIN A1C: Hgb A1c MFr Bld: 9 % — ABNORMAL HIGH (ref 4.6–6.5)

## 2021-11-15 LAB — BASIC METABOLIC PANEL
BUN: 26 mg/dL — ABNORMAL HIGH (ref 6–23)
CO2: 20 mEq/L (ref 19–32)
Calcium: 10.1 mg/dL (ref 8.4–10.5)
Chloride: 104 mEq/L (ref 96–112)
Creatinine, Ser: 1.23 mg/dL (ref 0.40–1.50)
GFR: 58.33 mL/min — ABNORMAL LOW (ref >60.00)
Glucose, Bld: 158 mg/dL — ABNORMAL HIGH (ref 70–99)
Potassium: 5.1 mEq/L (ref 3.5–5.1)
Sodium: 134 mEq/L — ABNORMAL LOW (ref 135–145)

## 2021-11-15 MED ORDER — TYPHOID VACCINE PO CPDR: 1.0000 | DELAYED_RELEASE_CAPSULE | ORAL | 0 refills | Status: DC

## 2021-11-15 MED ORDER — DOXYCYCLINE HYCLATE 100 MG PO CAPS
100.0000 mg | ORAL_CAPSULE | Freq: Every day | ORAL | 0 refills | Status: DC
Start: 2021-11-15 — End: 2022-02-27

## 2021-11-23 ENCOUNTER — Ambulatory Visit (INDEPENDENT_AMBULATORY_CARE_PROVIDER_SITE_OTHER): Payer: Medicare HMO | Admitting: Pharmacist

## 2021-11-23 DIAGNOSIS — E1165 Type 2 diabetes mellitus with hyperglycemia: Secondary | ICD-10-CM

## 2021-11-23 DIAGNOSIS — E785 Hyperlipidemia, unspecified: Secondary | ICD-10-CM

## 2021-11-23 DIAGNOSIS — I10 Essential (primary) hypertension: Secondary | ICD-10-CM

## 2021-11-23 DIAGNOSIS — K219 Gastro-esophageal reflux disease without esophagitis: Secondary | ICD-10-CM

## 2021-11-23 MED ORDER — FAMOTIDINE 20 MG PO TABS: 20.0000 mg | ORAL_TABLET | Freq: Two times a day (BID) | ORAL | 3 refills | Status: DC

## 2021-11-23 NOTE — Patient Instructions (Addendum)
Timothy Chapman ?It was a pleasure speaking with you today.  ?I have attached a summary of our visit today and information about your health goals.  ?See below ? ? ? ?If you have any questions or concerns, please feel free to contact me either at the phone number below or with a MyChart message.  ? ?Keep up the good work! ? ?Cherre [place], PharmD ?Clinical Pharmacist ?[facility] ?[place] ?[phone] (direct line)  ?[phone] (main office number) ? ? ?Chronic Care Management Care Plan - last updated 11/23/2021 ?  ?Hypertension ?BP Readings from Last 3 Encounters:  ?11/15/21 (!) 142/88  ?08/16/21 130/84  ?06/28/21 (!) 142/84  ? ?Pharmacist Clinical Goal(s): ?Over the next 90 days, patient will work with PharmD and providers to achieve BP goal <140/90 while minimizing side effects / dizziness and falls. ?Current regimen:  ?Benazepril '40mg'$  twice a day   ?Interventions: ?Discussed blood pressure goal ?Discussed diet and exercise ?Recommended patient check blood pressure 1 to 3 times per week, document, and provide at future appointments ?Patient self care activities - Over the next 90 days, patient will: ?Check blood pressure 1 to 3 times per week, document, and provide at future appointments ?Ensure daily salt intake < 2300 mg/day ?Continue Benazepril '40mg'$  twice a day ? ? ?Hyperlipidemia with elevated triglycerides ?Lipid Panel  ?   ?Component Value Date/Time  ? CHOL 145 04/26/2021 1201  ? TRIG (H) 04/26/2021 1201  ?  815.0 Triglyceride is over 400; calculations on Lipids are invalid.  ? HDL 25.00 (L) 04/26/2021 1201  ? CHOLHDL 6 04/26/2021 1201  ? LDLDIRECT 34.0 04/26/2021 1201  ? ? ?Pharmacist Clinical Goal(s): ?Over the next 90 days, patient will work with PharmD and providers to achieve LDL goal < 100 and Triglycerides <150 ?Current regimen:  ?Aspirin '81mg'$  daily ?Niaspan CR '1000mg'$  at bedtime (lowers triglycerides and can increase HDL) ?Salmon Oil '1000mg'$  twice daily per [facility] (lowers  triglycerides and can increase HDL) ?Praluent '75mg'$  every 14 days (lowers LDL mostly be also can lower triglycerides) ?Interventions: ?Discussed lipid goals and risk associated with elevated triglycerides ?Reviewed foods that can increase triglycerides; continue to limit intake of alcohol, sweets / sugar, potatoes, bread, rice and pasta ?[facility] $2500 [facility] for 2023 for hypercholesterolemia ?Patient self care activities - Over the next 90 days, patient will: ?Maintain cholesterol medication regimen.  ?Limit intake of foods that can increase triglycerides ?Encouraged increased physical activity ?[facility] has filled Praluent for $0 - should be able to pick up 90 day supply in 1 to 2 days. ? ?Diabetes ?Lab Results  ?Component Value Date/Time  ? HGBA1C 9.0 (H) 11/15/2021 10:56 AM  ? HGBA1C 8.7 (H) 08/16/2021 10:58 AM  ? HGBA1C 8.2 08/12/2018 12:00 AM  ?A1c at Dr Theodosia Blender office was 10.6% on 02/10/2021 ? ?Pharmacist Clinical Goal(s): ?Over the next 90 days, patient will work with PharmD and providers to achieve A1c goal <7% ?Current regimen:  ?Ozempic - inject 2 mg weekly ?Metformin XR '500mg'$  - take 2 tablets = '1000mg'$  twice a day with morning and evening meals ?Interventions: ?Discussed A1c and blood glucose goals ?Reviewed home blood glucose readings and reviewed goals  ?Fasting blood glucose goal (before meals) = 80 to 130 ?Blood glucose goal after a meal = less than 180  ?Patient self care activities - Over the next 90 days, patient will: ?Check blood sugar once daily, document, and provide at future appointments ?Limit intake of high carbohyrates foods and sugar - continue with  low carbohydrate diet ?Continue to increase physical activity - goal is to work up to at least 150 minutes per week.  ?Contact provider with any episodes of hypoglycemia or blood glucose over 250 or less than 80 ? ?Prostate Health /  elevated PSA; ?Interventions: ?Follow up with urology clinic ?Patient self care  activities - Over the next 30 days, patient will: ?Continue to follow up with urology clinic  ? ?Osteoarthritis ?Pharmacist Clinical Goal(s) ?Over the next 90 days, patient will work with PharmD and providers to reduce symptoms associated with arthritis ?Current regimen:  ?Alpha lipoic acid '600mg'$  daily  ?Diclofenac '75mg'$  up to twice a day if needed for pain ?Patient self care activities - Over the next 90 days, patient will: ?Maintain medication regimen for arthritis   ? ?GERD ?Pharmacist Clinical Goal(s) ?Over the next 90 days, patient will work with PharmD and providers to reduce symptoms associated with GERD ?Current regimen:  ?Famotidine '20mg'$  twice daily ?Omeprazole '40mg'$  daily at bedtime ?Patient self care activities - Over the next 90 days, patient will: ?Take omeprazole in morning on empty stomach and take famotidine at bedtime ?Updated prescription for 90 days for famotidine sent to [facility]. ? ?Medication management ?Pharmacist Clinical Goal(s): ?Over the next 90 days, patient will work with PharmD and providers to maintain optimal medication adherence ?[facility] ?Interventions ?Comprehensive medication review performed. ?Continue current medication management strategy ?Verified patient has reduced vitamin B12 supplement to every other day as recommended by Dr Lorelei Pont after last labs. ?Patient self care activities - Over the next 90 days, patient will: ?Focus on medication adherence by filling and taking medications appropriately  ?Take medications as prescribed ?Report any questions or concerns to PharmD and/or provider(s) ?Patient endorsed he has enough of all meds to get thru upcoming trip 12/11/2021 ro around 02/10/2022 except famotidine, Praluent and Ozempic. Coordinated with pharmacy to get famotidine and Praluent. Patient to receive Ozempic soon.  ? ? ?Patient Goals/Self-Care Activities ?take medications as prescribed ?check glucose daily , document,  and provide at future appointments,  ?check blood pressure 1 to 3 times per week, document, and provide at future appointments ? ? ?Patient verbalizes understanding of instructions and care plan provided today and agrees to view in [place]. Active MyChart status confirmed with patient.    ?

## 2021-11-23 NOTE — Chronic Care Management (AMB) (Signed)
Chronic Care Management Pharmacy Note  11/23/2021 Name:  Timothy Chapman MRN:  [id number] DOB:  1945-09-19  Summary: Assisted with coordinating refills for 90 days for Praluent and provided information to [facility] for [name] coverage. Cost to patient was $0. Assisted with updated Rx for famotidine for 90 days.    Subjective: Timothy Chapman is an 76 y.o. year old male who is a primary patient of Copland, Gay Filler, MD.  The CCM team was consulted for assistance with disease management and care coordination needs.    Engaged with patient by telephone for follow up visit in response to provider referral for pharmacy case management and/or care coordination services.   Consent to Services:  The patient was given information about Chronic Care Management services, agreed to services, and gave verbal consent prior to initiation of services.  Please see initial visit note for detailed documentation.   Patient Care Team: Copland, Gay Filler, MD as PCP - General (Family Medicine) Cherre Robins, RPH-CPP (Pharmacist)  Recent office visits: 11/15/2021 - Fam Med (Dr Lorelei Pont) Seen for follow up. Labs checked. Will be traveling to [place] for about 2 months soon. Provided Rx for doxycycline and Typhoid Vaccine. F/U 3 to 4 months. 08/17/2021 - Patient Message - Dr Lorelei Pont. Encouraged adherence to Praulent and Niacin due to elevated Tg and LDL. Recommended limiting intake of food high in potassium 08/16/2021 - Fam Med (Dr Lorelei Pont) F/U HTN and DM. Noted left lateral abdominal pain. CT of abdomen ordered.     Recent consult visits: 07/13/2021 - MR of prostate at [facility]. No convincing MR evidence of a clinically significant focal prostate cancer. Attention on followup as indicated.  06/28/2021 - Neurology (Dr Billey Gosling) evaluation of neuropathy and falls.He has tried multiple treatments for neuropathy previously and is not interested in trying any more medications.  Checked labs and recommended continue physical therapy.  06/27/2021 - Ophthalmology at [facility]. 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 Received Eylea injection in left eye 06/21/2021 - Urology ([facility]) f/u prostate cancer and his elevated serum PSA value. I will repeat PSA today. Ordered repeat MRI. Also discussed repeat prostate biopsy given his history of prostate cancer.   Hospital visits: None in previous 6 months  Objective:  Lab Results  Component Value Date   CREATININE 1.23 11/15/2021   CREATININE 1.43 08/16/2021   CREATININE 1.44 04/26/2021    Lab Results  Component Value Date   HGBA1C 9.0 (H) 11/15/2021   Last diabetic Eye exam:  Lab Results  Component Value Date/Time   HMDIABEYEEXA Retinopathy (A) 06/27/2021 12:00 AM    Last diabetic Foot exam: No results found for: HMDIABFOOTEX      Component Value Date/Time   CHOL 175 08/16/2021 1058   TRIG (H) 08/16/2021 1058    475.0 Triglyceride is over 400; calculations on Lipids are invalid.   HDL 33.30 (L) 08/16/2021 1058   CHOLHDL 5 08/16/2021 1058   VLDL 57.0 (H) 07/09/2017 0826   LDLCALC  07/06/2020 1448     Comment:     . LDL cholesterol not calculated. Triglyceride levels greater than 400 mg/dL invalidate calculated LDL results. . Reference range: <100 . Desirable range <100 mg/dL for primary prevention;   <70 mg/dL for patients with CHD or diabetic patients  with > or = 2 CHD risk factors. Marland Kitchen LDL-C is now calculated using the Martin-Hopkins  calculation, which is a validated novel method providing  better accuracy  than the Friedewald equation in the  estimation of LDL-C.  Cresenciano Genre et al. Annamaria Helling. 2376;283(15): 2061-2068  (http://education.Quest[employer]ostics.com/faq/FAQ164)    LDLDIRECT 106.0 08/16/2021 1058    Hepatic Function Latest Ref Rng & Units 08/16/2021 06/28/2021 04/26/2021  Total Protein 6.0 - 8.3 g/dL 7.3 7.9 7.5  Albumin 3.5 - 5.2 g/dL 4.3 - 4.2  AST 0 - 37 U/L 26 - 32   ALT 0 - 53 U/L 32 - 33  Alk Phosphatase 39 - 117 U/L 36(L) - 48  Total Bilirubin 0.2 - 1.2 mg/dL 0.4 - 0.4  Bilirubin, Direct 0.1 - 0.5 mg/dL - - -    No results found for: TSH, FREET4  CBC Latest Ref Rng & Units 11/15/2021 04/26/2021 07/06/2020  WBC 4.0 - 10.5 K/uL 7.3 6.9 8.9  Hemoglobin 13.0 - 17.0 g/dL 12.3(L) 12.1(L) 13.3  Hematocrit 39.0 - 52.0 % 38.2(L) 36.5(L) 40.7  Platelets 150.0 - 400.0 K/uL 293.0 232.0 247    No results found for: VD25OH  Clinical ASCVD: Yes  The 10-year ASCVD risk score (Arnett DK, et al., 2019) is: 57.7%   Values used to calculate the score:     Age: 57 years     Sex: Male     Is Non-Hispanic African American: No     Diabetic: Yes     Tobacco smoker: Yes     Systolic Blood Pressure: 176 mmHg     Is BP treated: Yes     HDL Cholesterol: 33.3 mg/dL     Total Cholesterol: 175 mg/dL     Social History   Tobacco Use  Smoking Status Former   Types: Cigarettes   Quit date: 09/18/1975   Years since quitting: 46.2  Smokeless Tobacco Never   BP Readings from Last 3 Encounters:  11/15/21 (!) 142/88  08/16/21 130/84  06/28/21 (!) 142/84   Pulse Readings from Last 3 Encounters:  11/15/21 97  08/16/21 68  06/28/21 64   Wt Readings from Last 3 Encounters:  11/15/21 240 lb (108.9 kg)  08/16/21 237 lb 12.8 oz (107.9 kg)  06/28/21 232 lb (105.2 kg)    Assessment: Review of patient past medical history, allergies, medications, health status, including review of consultants reports, laboratory and other test data, was performed as part of comprehensive evaluation and provision of chronic care management services.   SDOH:  (Social Determinants of Health) assessments and interventions performed:     CCM Care Plan  Allergies  Allergen Reactions   Codeine     hallucinations   Farxiga [Dapagliflozin] Other (See Comments)    Genital infection and irritation   Statins     Has tried several cannot tolerate    Medications Reviewed Today      Reviewed by Cherre Robins, RPH-CPP (Pharmacist) on 11/23/21 at 61  Med List Status: <None>   Medication Order Taking? Sig Documenting Provider Last Dose Status Informant  Acetylcarnitine HCl 500 MG CAPS [id number] Yes Take 500 mg by mouth daily. [provider] Taking Active Self           Med Note Rosemarie Beath, MELISSA B   Thu Nov 21, 2017 12:03 PM)    Alcohol Swabs (B-D SINGLE USE SWABS REGULAR) PADS [id number] Yes USE PRIOR TO INSULIN DOSE Copland, Gay Filler, MD Taking Active   Alirocumab (PRALUENT) 75 MG/ML Darden Palmer [id number] Yes INJECT 75MG INTO THE SKIN EVERY 14 DAYS Copland, Gay Filler, MD Taking Active   Alpha-Lipoic Acid 600 MG CAPS [id number] Yes Take 600 mg by mouth daily.  [provider] Taking Active Self           Med Note Antony Contras, Andraya Frigon B   Tue Apr 04, 2021  1:16 PM)    aspirin 81 MG chewable tablet [id number] Yes Chew 1 tablet (81 mg total) by mouth daily. Shelly Coss, MD Taking Active Self  benazepril (LOTENSIN) 40 MG tablet [id number] Yes Take 1 tablet (40 mg total) by mouth in the morning and at bedtime.  Patient taking differently: Take 40 mg by mouth 2 (two) times daily.   Copland, Gay Filler, MD Taking Active   diclofenac (VOLTAREN) 75 MG EC tablet [id number] Yes Take one BID prn joint pain Copland, Gay Filler, MD Taking Active   doxycycline (VIBRAMYCIN) 100 MG capsule [id number] Yes Take 1 capsule (100 mg total) by mouth daily. Take for malaria prophylaxis- start 1-2 days prior to arrival in country and continue for 4 weeks once home Copland, Gay Filler, MD Taking Active   famotidine (PEPCID) 20 MG tablet [id number] Yes Take 1 tablet (20 mg total) by mouth 2 (two) times daily. Copland, Gay Filler, MD Taking Active            Med Note Theodosia Blender Apr 04, 2021  1:18 PM) Taking as needed  Lancets (ONETOUCH DELICA PLUS INOMVE72C) [place] [id number] Yes USE TWO TIMES A DAY TO     CHECK BLOOD SUGAR Copland, Gay Filler, MD Taking Active   melatonin 5 MG TABS [id number] Yes  Take 5 mg by mouth at bedtime.  [provider] Taking Active Self  metFORMIN (GLUCOPHAGE-XR) 500 MG 24 hr tablet [id number] Yes Take 2 tablets (1,000 mg total) by mouth 2 (two) times daily after a meal. Take with food Copland, Gay Filler, MD Taking Active   Multiple Vitamin (MULTIVITAMIN) tablet [id number] Yes Take 1 tablet by mouth daily. [provider] Taking Active Self  niacin (NIASPAN) 1000 MG CR tablet [id number] No Take 1,000 mg by mouth at bedtime.  Patient not taking: Reported on 11/23/2021   [provider] Not Taking Active            Med Note Antony Contras, Adria Dill Nov 23, 2021  2:06 PM) Patient is taking over-the-counter niacin  omeprazole (PRILOSEC) 40 MG capsule [id number] Yes Take 1 capsule (40 mg total) by mouth daily. Copland, Gay Filler, MD Taking Active   [facility] VERIO test strip [id number] Yes USE AS INSTRUCTED Copland, Gay Filler, MD Taking Active   OVER THE COUNTER MEDICATION [id number] Yes Singing nettle daily for prostate [provider] Taking Active   Semaglutide, 2 MG/DOSE, (OZEMPIC, 2 MG/DOSE,) 8 MG/3ML SOPN [id number] Yes Inject 1.5 mg into the skin once a week. [provider] Taking Active   tadalafil (CIALIS) 10 MG tablet [id number] Yes Take 1 tablet (10 mg total) by mouth daily as needed for erectile dysfunction. Max 20 mg total per day Copland, Gay Filler, MD Taking Active   tadalafil (CIALIS) 5 MG tablet [id number] Yes Take 1 tablet (5 mg total) by mouth daily. Copland, Gay Filler, MD Taking Active   typhoid (VIVOTIF) DR capsule [id number] Yes Take 1 capsule by mouth every other day. Take prior to travel Copland, Gay Filler, MD Taking Active   vitamin C (ASCORBIC ACID) 500 MG tablet [id number] Yes Take 500 mg by mouth daily. 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   Paroxysmal atrial fibrillation ([place]) 12/16/2017   SBO (small  bowel obstruction) ([place]) 10/08/2017   ATN (acute tubular necrosis) ([place]) 10/08/2017   Septic shock ([place]) 10/04/2017   Acute cholecystitis s/p perc cholecystomy drainage 10/04/2017    Acute respiratory failure with hypoxia (H[facility])    Chronic back pain 02/08/2016   Controlled type 2 diabetes mellitus with diabetic neuropathy, without long-term current use of insulin ([place]) 02/08/2016   Insomnia 02/08/2016   History of diverticulitis 02/08/2016   HYPERTENSION, BENIGN ESSENTIAL, UNCONTROLLED 03/12/2009   ABNORMAL ELECTROCARDIOGRAM 03/12/2009    Immunization History  Administered Date(s) Administered   Fluad Quad(high Dose 65+) 05/28/2019, 07/06/2020   Influenza, High Dose Seasonal PF 06/20/2016, 07/08/2017   Influenza-Unspecified 08/12/2021   PFIZER Comirnaty(Gray Top)Covid-19 Tri-Sucrose Vaccine 11/15/2019, 12/15/2019, 09/02/2020   PFIZER(Purple Top)SARS-COV-2 Vaccination 11/15/2019, 12/15/2019   Pfizer Covid-19 Vaccine Bivalent Booster 44yrs & up 08/16/2021   Pneumococcal Conjugate-13 06/20/2016   Pneumococcal Polysaccharide-23 02/06/2018, 05/28/2019   Pneumococcal-Unspecified 12/20/2011, 05/18/2012   Td 11/15/2021   Tdap 09/18/2011, 10/31/2011   Zoster Recombinat (Shingrix) 02/19/2020, 05/03/2020   Conditions to be addressed/monitored: HTN, HLD, DMII, GERD, BPH, and arthritis pain; ED  Care Plan : General Pharmacy (Adult)  Updates made by Cherre Robins, RPH-CPP since 11/23/2021 12:00 AM     Problem: Chronic Disease Management support, education, and care coordination needs related to HTN, Hyperlipidemia, Diabetes, Insomnia, GERD, Pain, BPH/ED   Note:   Current Barriers:  Unable to maintain control of type 2 DM Does not notify physician's office if has questions or concerns In need of Chronic Disease Management support, education, and care coordination related to Hypertension, Hyperlipidemia, Diabetes, GERD (with Barrett's Esophagus); NASH; Pain, BPH/ED History of low adherence to  maintenance medications Unable to afford therapy for hyperlipidemia (Praluent)   Pharmacist Clinical Goal(s):  Over the next 180 days, patient will verbalize ability to afford treatment regimen achieve adherence to monitoring guidelines and medication adherence to achieve therapeutic efficacy achieve control of diabetes and hypertension as evidenced by A1c < 7.0% and BP <140/90 adhere to prescribed medication regimen as evidenced by refill history and meeting of goal mentioned below  through collaboration with PharmD and provider.  Better understand medication regimen and improve adherence to medication therapy Maintain communication with PCP's office   Interventions: 1:1 collaboration with Copland, Gay Filler, MD regarding development and update of comprehensive plan of care as evidenced by provider attestation and co-signature Inter-disciplinary care team collaboration (see longitudinal plan of care) Comprehensive medication review performed; medication list updated in electronic medical record  Diabetes: Uncontrolled - A1c goal <7.0% A1c is not at goal but improved from 10.6% 02/10/2021 to 9.7% 04/26/2021 and 8.7% 07/2021 Current regimen:  Ozempic - inject $Remove'2mg'wsgMSId$  weekly (dose increased 11/15/2021) Metformin XR $RemoveBefo'500mg'SPutGOEFvZC$  - take 2 tablets = $RemoveB'1000mg'RKNqZGBl$  twice a day with morning and evening meals Was unable to tolerate [place] - caused irritation and infection of genitals; Rybelsus - cost and more nasuea than Ozempic Has had 1 dose of Ozempic $RemoveBe'2mg'IqnZnBXgz$  and so far is tolerating. C-Peptide checked at [facility] 01/2021 was high normal at 4.06 Patient reports recent home BG: ranging from 115 to 210 Denies BG <80 / hypoglycemic symptoms Eye Exam completed 06/27/2021 at [facility] - Dr Valorie Roosevelt Readings from Last 3 Encounters:  11/15/21 240 lb (108.9 kg)  08/16/21 237 lb 12.8 oz (107.9 kg)  06/28/21 232 lb (105.2 kg)  Reviewed diet - has been slow to restart low carbohydrate diet after not following  in November and  December 2022 Exercise: using treadmill a few times per week Interventions: Discussed A1c and blood glucose goals Reviewed home blood glucose readings and reviewed goals  Fasting blood glucose goal (before meals) = 80 to 130 Blood glucose goal after a meal = less than 180  Continue with current medication. If A1c still > 8.0 at next visit, consider long acting insulin addition or Mounjaro (though will need to check coverage through [place]) Patient has ordered Ozempic for 3 months - will be leaving around 12/11/21 to visit [place] for 2 months. If he does not get full prescription of 3 months from [place] this time, he will contact our office.  Restart low carbohydrate diet and exercise at least 150 minutes per week.   Hypertension: BP Readings from Last 3 Encounters:  11/15/21 (!) 142/88  08/16/21 130/84  06/28/21 (!) 142/84  Blood pressure slightly above at last office visit. Home blood pressure has been lower.  Goal BP <140/90 Current regimen:  Benazepril 81m twice a day  Home blood pressure: 132 to 140 / 80's Previous medications tried: amlodipine - stopped due to dizziness; losartan-hydrochlorothiazide - patient concerned for hydrochlorothiazide side effects and did not feel that he needed it because his "ankles are skinny and shriveled up" Patient reports he was falling a lot when he was taking amlodipine; Dr WJoyce Grosswith AMarble Hillstopped amlodipine in May and patient reports he is no longer falling.  He saw neurology for dizziness / neuropathy 06/2021. No cause of dizziness noted. No further follow up recommended unless patient would like to pursue treatment of neuropathy.  Interventions: Recommended patient check blood pressure 1 to 3 times per week, document, and provide at future appointments  Discussed blood pressure goal Discussed diet and exercise Ensure daily salt intake < 2300 mg/day Continue current regimen to lower blood pressure   Hyperlipidemia with  elevated triglycerides LDL nor triglycerides at goal;  Triglycerides have improved from 815 to 475. HDLC also improaved form 25 to 33.  LDL goal < 100 and Triglycerides <150 Current regimen:  Niaspan CR 10084mat bedtime - take aspirin 8152mrior to niaspan to decrease flushing (prescription niacin too expensive patient taking over-the-counter)  Salmon Oil 1000m19mice daily per [facility]  Praluent 75mg76mry 14 days  Patient has VA benefits - contacted VA to see if Praluent is on formulary. [place] states he tried to discuss with his VA PCTylerat last visit but did not have time to address.  [facility]ole[facility] - $2500 available for year. Patient reports [facility] did not use [employer] funds last month when filled and cost was $47.  Patient denies alcohol use Interventions: Discussed lipid goals and risk associated with elevated trilycerides Coordinated with pharmacy to fill 3 month supply of Praluent - cost was $141. Provided [employer] card info. After [place] [facility], cost was $0 Reviewed foods that can increase triglycerides; encouraged patient to continue to limit intake of alcohol, sweets / sugar, potatoes, bread, rice and pasta (addressed at previous visit) Encouraged increased physical activity  Chronic Back Pain / Osteoarthritis Controlled Current regimen:  Alpha lipoic acid 600mg 12my  Diclofenac 75mg u34m twice a day if needed for pain Patient self care activities - Over the next 90 days, patient will: Maintain medication regimen for arthritis    GERD Controlled Current regimen:  Famotidine 20mg tw50mdaily Omeprazole 40mg dai8mt bedtime Patient  self care activities - Over the next 90 days, patient will: Take omeprazole in morning on empty stomach and take famotidine at bedtime Sent in prescription for famotidine to [facility] for 90 days at patient  request  Insomnia:  Current Regimen:  Melatonin 20m qhs Patient reports he is sleeping fairly well at night.  He does have a 74year old at home who is not sleeping through the night most nights.  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  Interventions: none Continue current regimen for sleep  BPH with elevated PSA; Patient has seen urologist regarding elevated; Had MRI of prostate 07/13/2021 - radiology ([facility] - FRonal Fear FNP) Noted no evidence of a clinically significant focal prostate cancer. Attention on followup as indicated.  [facility]   [facility] Interventions Comprehensive medication review performed. Medication list updated Reviewed refill history and discussed adherence with patient. Continue current medication management strategy Patient endorsed he has enough of all meds to get thru upcoming trip 12/11/2021 ro around 02/10/2022 except famotidine, Praluent and Ozempic. Coordinated with pharmacy to get famotidine and Praluent. Patient to receive Ozempic soon.   Patient Goals/Self-Care Activities Over the next 180 days, patient will:  take medications as prescribed, check glucose daily , document, and provide at future appointments, check blood pressure 1 to 3 times per week, document, and provide at future appointments, and collaborate with provider on medication access solutions  Follow Up Plan: Telephone follow up appointment with care management team member scheduled for:  June 2023             Medication Assistance: States he cannot afford Praluent - has not taken in 4 months. Applied for [employer] with Praluent and any medication for hypercholesterolemia. Patient was approved for $2500 from 08/28/2021 thru 08/27/2022  Patient's preferred pharmacy is:  [facility] - [address] [place]Phone: [phone]: [phone] [facility] [address] [place] [id number]: [phone]: [phone]- [place] [place] [id number]: [phone]: [phone] [id number] [place] - [address] [place]: [phone]: [phone] Follow Up:  Patient agrees to Care Plan and Follow-up.  Plan: Telephone follow up appointment with care management team member scheduled for:  June 2023  TaCherre RobinsPharmD Clinical Pharmacist [facility]

## 2021-12-01 ENCOUNTER — Encounter: Payer: Self-pay | Admitting: Family Medicine

## 2021-12-01 MED ORDER — SEMAGLUTIDE (2 MG/DOSE) 8 MG/3ML ~~LOC~~ SOPN: 2.0000 mg | PEN_INJECTOR | SUBCUTANEOUS | 1 refills | Status: DC

## 2021-12-01 NOTE — Addendum Note (Signed)
Addended by: Lamar Blinks C on: 12/01/2021 06:18 PM ? ? Modules accepted: Orders ? ?

## 2021-12-04 DIAGNOSIS — M160 Bilateral primary osteoarthritis of hip: Secondary | ICD-10-CM

## 2021-12-04 HISTORY — DX: Bilateral primary osteoarthritis of hip: M16.0

## 2021-12-15 DIAGNOSIS — M1990 Unspecified osteoarthritis, unspecified site: Secondary | ICD-10-CM

## 2021-12-15 DIAGNOSIS — E1159 Type 2 diabetes mellitus with other circulatory complications: Secondary | ICD-10-CM

## 2021-12-15 DIAGNOSIS — N401 Enlarged prostate with lower urinary tract symptoms: Secondary | ICD-10-CM

## 2021-12-15 DIAGNOSIS — Z7984 Long term (current) use of oral hypoglycemic drugs: Secondary | ICD-10-CM | POA: Diagnosis not present

## 2021-12-15 DIAGNOSIS — I10 Essential (primary) hypertension: Secondary | ICD-10-CM

## 2021-12-15 DIAGNOSIS — Z7985 Long-term (current) use of injectable non-insulin antidiabetic drugs: Secondary | ICD-10-CM | POA: Diagnosis not present

## 2021-12-15 DIAGNOSIS — R972 Elevated prostate specific antigen [PSA]: Secondary | ICD-10-CM | POA: Diagnosis not present

## 2021-12-15 DIAGNOSIS — E7849 Other hyperlipidemia: Secondary | ICD-10-CM | POA: Diagnosis not present

## 2022-02-06 ENCOUNTER — Telehealth: Payer: Self-pay

## 2022-02-06 NOTE — Telephone Encounter (Signed)
Received call from [facility]- they wanted to recommend statin therapy for him- informed that per med records he has already tried and failed several statins in the past.

## 2022-02-07 ENCOUNTER — Other Ambulatory Visit: Payer: Self-pay | Admitting: Family Medicine

## 2022-02-27 ENCOUNTER — Ambulatory Visit (INDEPENDENT_AMBULATORY_CARE_PROVIDER_SITE_OTHER): Payer: Medicare HMO

## 2022-02-27 DIAGNOSIS — Z0000 Encounter for general adult medical examination without abnormal findings: Secondary | ICD-10-CM | POA: Diagnosis not present

## 2022-02-27 NOTE — Patient Instructions (Signed)
Timothy Chapman , Thank you for taking time to come for your Medicare Wellness Visit. I appreciate your ongoing commitment to your health goals. Please review the following plan we discussed and let me know if I can assist you in the future.   Screening recommendations/referrals: Colonoscopy: Done 07/07/18 repeat every 10 years  Recommended yearly ophthalmology/optometry visit for glaucoma screening and checkup Recommended yearly dental visit for hygiene and checkup  Vaccinations: Influenza vaccine: Done 08/12/21 repeat every year  Pneumococcal vaccine: Up to date Tdap vaccine: Done 11/15/21 repeat every 10 years  Shingles vaccine: Completed  6/4, 05/03/20 Covid-19: Completed 2/28, 3/30, 09/02/20 & 08/16/21  Advanced directives: Advance directive discussed with you today. Even though you declined this today please call our office should you change your mind and we can give you the proper paperwork for you to fill out.  Conditions/risks identified: get rid of pain  Next appointment: Follow up in one year for your annual wellness visit.   Preventive Care 73 Years and Older, Male Preventive care refers to lifestyle choices and visits with your health care provider that can promote health and wellness. What does preventive care include? A yearly physical exam. This is also called an annual well check. Dental exams once or twice a year. Routine eye exams. Ask your health care provider how often you should have your eyes checked. Personal lifestyle choices, including: Daily care of your teeth and gums. Regular physical activity. Eating a healthy diet. Avoiding tobacco and drug use. Limiting alcohol use. Practicing safe sex. Taking low doses of aspirin every day. Taking vitamin and mineral supplements as recommended by your health care provider. What happens during an annual well check? The services and screenings done by your health care provider during your annual well check will depend on  your age, overall health, lifestyle risk factors, and family history of disease. Counseling  Your health care provider may ask you questions about your: Alcohol use. Tobacco use. Drug use. Emotional well-being. Home and relationship well-being. Sexual activity. Eating habits. History of falls. Memory and ability to understand (cognition). Work and work Statistician. Screening  You may have the following tests or measurements: Height, weight, and BMI. Blood pressure. Lipid and cholesterol levels. These may be checked every 5 years, or more frequently if you are over 77 years old. Skin check. Lung cancer screening. You may have this screening every year starting at age 38 if you have a 30-pack-year history of smoking and currently smoke or have quit within the past 15 years. Fecal occult blood test (FOBT) of the stool. You may have this test every year starting at age 70. Flexible sigmoidoscopy or colonoscopy. You may have a sigmoidoscopy every 5 years or a colonoscopy every 10 years starting at age 20. Prostate cancer screening. Recommendations will vary depending on your family history and other risks. Hepatitis C blood test. Hepatitis B blood test. Sexually transmitted disease (STD) testing. Diabetes screening. This is done by checking your blood sugar (glucose) after you have not eaten for a while (fasting). You may have this done every 1-3 years. Abdominal aortic aneurysm (AAA) screening. You may need this if you are a current or former smoker. Osteoporosis. You may be screened starting at age 53 if you are at high risk. Talk with your health care provider about your test results, treatment options, and if necessary, the need for more tests. Vaccines  Your health care provider may recommend certain vaccines, such as: Influenza vaccine. This is recommended every year.  Tetanus, diphtheria, and acellular pertussis (Tdap, Td) vaccine. You may need a Td booster every 10 years. Zoster  vaccine. You may need this after age 55. Pneumococcal 13-valent conjugate (PCV13) vaccine. One dose is recommended after age 85. Pneumococcal polysaccharide (PPSV23) vaccine. One dose is recommended after age 67. Talk to your health care provider about which screenings and vaccines you need and how often you need them. This information is not intended to replace advice given to you by your health care provider. Make sure you discuss any questions you have with your health care provider. Document Released: 09/30/2015 Document Revised: 05/23/2016 Document Reviewed: 07/05/2015 Elsevier Interactive Patient Education  2017 [employer] Prevention in the Home Falls can cause injuries. They can happen to people of all ages. There are many things you can do to make your home safe and to help prevent falls. What can I do on the outside of my home? Regularly fix the edges of walkways and driveways and fix any cracks. Remove anything that might make you trip as you walk through a door, such as a raised step or threshold. Trim any bushes or trees on the path to your home. Use bright outdoor lighting. Clear any walking paths of anything that might make someone trip, such as rocks or tools. Regularly check to see if handrails are loose or broken. Make sure that both sides of any steps have handrails. Any raised decks and porches should have guardrails on the edges. Have any leaves, snow, or ice cleared regularly. Use sand or salt on walking paths during winter. Clean up any spills in your garage right away. This includes oil or grease spills. What can I do in the bathroom? Use night lights. Install grab bars by the toilet and in the tub and shower. Do not use towel bars as grab bars. Use non-skid mats or decals in the tub or shower. If you need to sit down in the shower, use a plastic, non-slip stool. Keep the floor dry. Clean up any water that spills on the floor as soon as it happens. Remove  soap buildup in the tub or shower regularly. Attach bath mats securely with double-sided non-slip rug tape. Do not have throw rugs and other things on the floor that can make you trip. What can I do in the bedroom? Use night lights. Make sure that you have a light by your bed that is easy to reach. Do not use any sheets or blankets that are too big for your bed. They should not hang down onto the floor. Have a firm chair that has side arms. You can use this for support while you get dressed. Do not have throw rugs and other things on the floor that can make you trip. What can I do in the kitchen? Clean up any spills right away. Avoid walking on wet floors. Keep items that you use a lot in easy-to-reach places. If you need to reach something above you, use a strong step stool that has a grab bar. Keep electrical cords out of the way. Do not use floor polish or wax that makes floors slippery. If you must use wax, use non-skid floor wax. Do not have throw rugs and other things on the floor that can make you trip. What can I do with my stairs? Do not leave any items on the stairs. Make sure that there are handrails on both sides of the stairs and use them. Fix handrails that are broken or loose.  Make sure that handrails are as long as the stairways. Check any carpeting to make sure that it is firmly attached to the stairs. Fix any carpet that is loose or worn. Avoid having throw rugs at the top or bottom of the stairs. If you do have throw rugs, attach them to the floor with carpet tape. Make sure that you have a light switch at the top of the stairs and the bottom of the stairs. If you do not have them, ask someone to add them for you. What else can I do to help prevent falls? Wear shoes that: Do not have high heels. Have rubber bottoms. Are comfortable and fit you well. Are closed at the toe. Do not wear sandals. If you use a stepladder: Make sure that it is fully opened. Do not climb a  closed stepladder. Make sure that both sides of the stepladder are locked into place. Ask someone to hold it for you, if possible. Clearly mark and make sure that you can see: Any grab bars or handrails. First and last steps. Where the edge of each step is. Use tools that help you move around (mobility aids) if they are needed. These include: Canes. Walkers. Scooters. Crutches. Turn on the lights when you go into a dark area. Replace any light bulbs as soon as they burn out. Set up your furniture so you have a clear path. Avoid moving your furniture around. If any of your floors are uneven, fix them. If there are any pets around you, be aware of where they are. Review your medicines with your doctor. Some medicines can make you feel dizzy. This can increase your chance of falling. Ask your doctor what other things that you can do to help prevent falls. This information is not intended to replace advice given to you by your health care provider. Make sure you discuss any questions you have with your health care provider. Document Released: 06/30/2009 Document Revised: 02/09/2016 Document Reviewed: 10/08/2014 Elsevier Interactive Patient Education  2017 [employer].

## 2022-02-27 NOTE — Progress Notes (Addendum)
Virtual Visit via Telephone Note  I connected with  Timothy Chapman on 02/27/22 at  2:15 PM EDT by telephone and verified that I am speaking with the correct person using two identifiers.  Medicare Annual Wellness visit completed telephonically due to Covid-19 pandemic.   Persons participating in this call: This Health Coach and this patient.   Location: Patient: Home Provider: Office   I discussed the limitations, risks, security and privacy concerns of performing an evaluation and management service by telephone and the availability of in person appointments. The patient expressed understanding and agreed to proceed.  Unable to perform video visit due to video visit attempted and failed and/or patient does not have video capability.   Some vital signs may be absent or patient reported.   Willette Brace, LPN   Subjective:   Timothy Chapman is a 76 y.o. male who presents for Medicare Annual/Subsequent preventive examination.  Review of Systems     Cardiac Risk Factors include: advanced age (>60mn, >>18women);diabetes mellitus;hypertension;male gender;obesity (BMI >30kg/m2)     Objective:    There were no vitals filed for this visit. There is no height or weight on file to calculate BMI.     02/27/2022    2:22 PM 02/22/2021    1:09 PM 09/25/2019    2:06 PM 12/12/2017    1:05 PM 10/02/2017    9:37 AM  Advanced Directives  Does Patient Have a Medical Advance Directive? No No No No No  Would patient like information on creating a medical advance directive? No - Patient declined No - Patient declined No - Patient declined No - Patient declined No - Patient declined    Current Medications (verified) Outpatient Encounter Medications as of 02/27/2022  Medication Sig   Acetylcarnitine HCl 500 MG CAPS Take 500 mg by mouth daily.   Alcohol Swabs (B-D SINGLE USE SWABS REGULAR) PADS USE PRIOR TO INSULIN DOSE   Alirocumab (PRALUENT) 75 MG/ML SOAJ INJECT '75MG'$  INTO THE SKIN EVERY 14  DAYS   Alpha-Lipoic Acid 600 MG CAPS Take 600 mg by mouth daily.   aspirin 81 MG chewable tablet Chew 1 tablet (81 mg total) by mouth daily.   benazepril (LOTENSIN) 40 MG tablet Take 1 tablet (40 mg total) by mouth in the morning and at bedtime. (Patient taking differently: Take 40 mg by mouth 2 (two) times daily.)   diclofenac (VOLTAREN) 75 MG EC tablet Take one BID prn joint pain   famotidine (PEPCID) 20 MG tablet Take 1 tablet (20 mg total) by mouth 2 (two) times daily.   Lancets (ONETOUCH DELICA PLUS [id number] MISC USE TWO TIMES A DAY TO     CHECK BLOOD SUGAR   melatonin 5 MG TABS Take 5 mg by mouth at bedtime.    metFORMIN (GLUCOPHAGE-XR) 500 MG 24 hr tablet TAKE 2 TABLETS TWICE A DAY  WITH FOOD   Multiple Vitamin (MULTIVITAMIN) tablet Take 1 tablet by mouth daily.   niacin (NIASPAN) 1000 MG CR tablet Take 1,000 mg by mouth at bedtime.   omeprazole (PRILOSEC) 40 MG capsule Take 1 capsule (40 mg total) by mouth daily.   ONETOUCH VERIO test strip USE AS INSTRUCTED   OVER THE COUNTER MEDICATION Singing nettle daily for prostate   Semaglutide, 2 MG/DOSE, 8 MG/3ML SOPN Inject 2 mg as directed once a week.   tadalafil (CIALIS) 10 MG tablet Take 1 tablet (10 mg total) by mouth daily as needed for erectile dysfunction. Max 20 mg total per day  tadalafil (CIALIS) 5 MG tablet Take 1 tablet (5 mg total) by mouth daily.   vitamin C (ASCORBIC ACID) 500 MG tablet Take 500 mg by mouth daily.   typhoid (VIVOTIF) DR capsule Take 1 capsule by mouth every other day. Take prior to travel   [DISCONTINUED] doxycycline (VIBRAMYCIN) 100 MG capsule Take 1 capsule (100 mg total) by mouth daily. Take for malaria prophylaxis- start 1-2 days prior to arrival in country and continue for 4 weeks once home   No facility-administered encounter medications on file as of 02/27/2022.    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   Cannot tolerate statins   GERD (gastroesophageal reflux disease)    Hypertension    Neuromuscular disorder ([facility])    neuropathy but being treated   PONV (postoperative nausea and vomiting)    Sleep apnea    has a sleep study but refuses that he has sleep study, not wearing CPAP   Past Surgical History:  Procedure Laterality Date   BACK SURGERY     X2 - lower   CHOLECYSTECTOMY N/A 12/20/2017   Procedure: LAPAROSCOPIC CHOLECYSTECTOMY WITH INTRAOPERATIVE CHOLANGIOGRAM POSSIBLE OPEN;  Surgeon: Fanny Skates, MD;  Location: [facility];  Service: General;  Laterality: N/A;   COLONOSCOPY     ESOPHAGOGASTRODUODENOSCOPY     IR RADIOLOGIST EVAL & MGMT  10/30/2017   IR RADIOLOGIST EVAL & MGMT  11/14/2017   PROSTATE SURGERY  2017   uro lift per pt.   REPLACEMENT TOTAL KNEE Left    ROTATOR CUFF REPAIR     Bil   Family History  Problem Relation Age of Onset   Heart failure Father    Heart disease Father    Emphysema Mother    Colon cancer Maternal Grandmother    Social History   Socioeconomic History   Marital status: Married    Spouse name: Not on file   Number of children: 2   Years of education: Not on file   Highest education level: Not on file  Occupational History   Not on file  Tobacco Use   Smoking status: Former    Types: Cigarettes    Quit date: 09/18/1975    Years since quitting: 46.4   Smokeless tobacco: Never  Vaping Use   Vaping Use: Never used  Substance and Sexual Activity   Alcohol use: Not Currently    Alcohol/week: 0.0 standard drinks of alcohol    Comment: Rarely   Drug use: Never   Sexual activity: Not on file  Other Topics Concern   Not on file  Social History Narrative   Right handed   Caffeine 1-2 cups daily   Lives at home with wife and his son.   Social Determinants of  Health   Financial Resource Strain: Low Risk  (02/27/2022)   Overall Financial Resource Strain (CARDIA)    Difficulty of Paying Living Expenses: Not very hard  Food Insecurity: No Food Insecurity (02/27/2022)   Hunger Vital Sign    Worried About Running Out of Food in the Last Year: Never true    Ran Out of Food in the Last Year: Never true  Transportation Needs: No Transportation Needs (02/27/2022)   PRAPARE -  Hydrologist (Medical): No    Lack of Transportation (Non-Medical): No  Physical Activity: Insufficiently Active (02/27/2022)   Exercise Vital Sign    Days of Exercise per Week: 2 days    Minutes of Exercise per Session: 20 min  Stress: No Stress Concern Present (02/27/2022)   [place]    Feeling of Stress : Only a little  Social Connections: Unknown (02/27/2022)   Social Connection and Isolation Panel [NHANES]    Frequency of Communication with Friends and Family: More than three times a week    Frequency of Social Gatherings with Friends and Family: More than three times a week    Attends Religious Services: Not on Advertising copywriter or Organizations: No    Attends Archivist Meetings: Never    Marital Status: Married    Tobacco Counseling Counseling given: Not Answered   Clinical Intake:  Pre-visit preparation completed: Yes  Pain : No/denies pain     BMI - recorded: 35.44 Nutritional Status: BMI > 30  Obese Nutritional Risks: None Diabetes: Yes CBG done?: No Did pt. bring in CBG monitor from home?: No  How often do you need to have someone help you when you read instructions, pamphlets, or other written materials from your doctor or pharmacy?: 1 - Never  Diabetic?Nutrition Risk Assessment:  Has the patient had any N/V/D within the last 2 months?  No  Does the patient have any non-healing wounds?  No  Has the patient had any unintentional  weight loss or weight gain?  No   Diabetes:  Is the patient diabetic?  Yes  If diabetic, was a CBG obtained today?  No  Did the patient bring in their glucometer from home?  No  How often do you monitor your CBG's? N/A.   Financial Strains and Diabetes Management:  Are you having any financial strains with the device, your supplies or your medication? No .  Does the patient want to be seen by Chronic Care Management for management of their diabetes?  No  Would the patient like to be referred to a Nutritionist or for Diabetic Management?  No   Diabetic Exams:  Diabetic Eye Exam: Completed 06/27/21 Diabetic Foot Exam: Completed 08/16/21   Interpreter Needed?: No  Information entered by :: Charlott Rakes, LPN   Activities of Daily Living    02/27/2022    2:23 PM  In your present state of health, do you have any difficulty performing the following activities:  Hearing? 0  Vision? 0  Difficulty concentrating or making decisions? 0  Walking or climbing stairs? 1  Comment try to avoid  Dressing or bathing? 0  Doing errands, shopping? 0  Preparing Food and eating ? N  Using the Toilet? N  In the past six months, have you accidently leaked urine? N  Do you have problems with loss of bowel control? N  Managing your Medications? N  Managing your Finances? N  Housekeeping or managing your Housekeeping? N    Patient Care Team: Copland, Gay Filler, MD as PCP - General (Family Medicine) Cherre Robins, RPH-CPP (Pharmacist)  Indicate any recent Medical Services you may have received from other than [facility] providers in the past year (date may be approximate).     Assessment:   This is a routine wellness examination for [facility].  Hearing/Vision screen Hearing Screening - Comments:: Pt denies any hearing issues  Vision  Screening - Comments:: Pt follows up with [facility]   Dietary issues and exercise activities discussed: Current Exercise Habits: Home exercise routine, Type  of exercise: Other - see comments, Time (Minutes): 20, Frequency (Times/Week): 2, Weekly Exercise (Minutes/Week): 40   Goals Addressed             This Visit's Progress    Patient Stated       To rid pain        Depression Screen    02/27/2022    2:20 PM 04/26/2021   11:19 AM 02/22/2021    1:11 PM 09/25/2019    2:09 PM 07/08/2017    3:12 PM 06/20/2016   11:04 AM  PHQ 2/9 Scores  PHQ - 2 Score 0 0 0 0 0 0  Exception Documentation     Patient refusal     Fall Risk    02/27/2022    2:23 PM 04/26/2021   11:19 AM 02/22/2021    1:10 PM 09/25/2019    1:55 PM 07/08/2017    3:12 PM  [place] in the past year? '1 1 1 1 '$ Yes  Number falls in past yr: '1  1 1 2 '$ or more  Injury with Fall? 1 0 0 0 No  Risk for fall due to : Impaired mobility;Impaired balance/gait;Impaired vision  History of fall(s)  Impaired balance/gait  Follow up Falls prevention discussed  Falls prevention discussed Education provided;Falls prevention discussed Falls prevention discussed    FALL RISK PREVENTION PERTAINING TO THE HOME:  Any stairs in or around the home? No  If so, are there any without handrails? No  Home free of loose throw rugs in walkways, pet beds, electrical cords, etc? Yes  Adequate lighting in your home to reduce risk of falls? Yes   ASSISTIVE DEVICES UTILIZED TO PREVENT FALLS:  Life alert? No  Use of a cane, walker or w/c? Yes  Grab bars in the bathroom? Yes  Shower chair or bench in shower? No  Elevated toilet seat or a handicapped toilet? No   TIMED UP AND GO:  Was the test performed? No .   Cognitive Function:        02/27/2022    2:26 PM  6CIT Screen  What Year? 0 points  What month? 0 points  What time? 0 points  Count back from 20 0 points  Months in reverse 0 points  Repeat phrase 0 points  Total Score 0 points    Immunizations Immunization History  Administered Date(s) Administered   Fluad Quad(high Dose 65+) 05/28/2019, 07/06/2020   Influenza, High Dose  Seasonal PF 06/20/2016, 07/08/2017   Influenza-Unspecified 08/12/2021   PFIZER Comirnaty(Gray Top)Covid-19 Tri-Sucrose Vaccine 11/15/2019, 12/15/2019, 09/02/2020   PFIZER(Purple Top)SARS-COV-2 Vaccination 11/15/2019, 12/15/2019   Pfizer Covid-19 Vaccine Bivalent Booster 29yr & up 08/16/2021   Pneumococcal Conjugate-13 06/20/2016   Pneumococcal Polysaccharide-23 02/06/2018, 05/28/2019   Pneumococcal-Unspecified 12/20/2011, 05/18/2012   Td 11/15/2021   Tdap 09/18/2011, 10/31/2011   Zoster Recombinat (Shingrix) 02/19/2020, 05/03/2020    TDAP status: Up to date  Flu Vaccine status: Up to date  Pneumococcal vaccine status: Up to date  Covid-19 vaccine status: Completed vaccines  Qualifies for Shingles Vaccine? Yes   Zostavax completed Yes   Shingrix Completed?: Yes  Screening Tests Health Maintenance  Topic Date Due   INFLUENZA VACCINE  04/17/2022   HEMOGLOBIN A1C  05/18/2022   OPHTHALMOLOGY EXAM  06/27/2022   FOOT EXAM  08/16/2022   COLONOSCOPY (Pts 45-458yrInsurance  coverage will need to be confirmed)  07/07/2028   TETANUS/TDAP  11/16/2031   Pneumonia Vaccine 56+ Years old  Completed   COVID-19 Vaccine  Completed   Hepatitis C Screening  Completed   Zoster Vaccines- Shingrix  Completed   HPV VACCINES  Aged Out    Health Maintenance  There are no preventive care reminders to display for this patient.  Colorectal cancer screening: Type of screening: Colonoscopy. Completed 07/07/18. Repeat every 10 years   Additional Screening:  Hepatitis C Screening:  Completed 10/02/17  Vision Screening: Recommended annual ophthalmology exams for early detection of glaucoma and other disorders of the eye. Is the patient up to date with their annual eye exam?  Yes  Who is the provider or what is the name of the office in which the patient attends annual eye exams? [facility]  If pt is not established with a provider, would they like to be referred to a provider to establish  care? No .   Dental Screening: Recommended annual dental exams for proper oral hygiene  Community Resource Referral / Chronic Care Management: CRR required this visit?  No   CCM required this visit?  No      Plan:     I have personally reviewed and noted the following in the patient's chart:   Medical and social history Use of alcohol, tobacco or illicit drugs  Current medications and supplements including opioid prescriptions. Patient is not currently taking opioid prescriptions. Functional ability and status Nutritional status Physical activity Advanced directives List of other physicians Hospitalizations, surgeries, and ER visits in previous 12 months Vitals Screenings to include cognitive, depression, and falls Referrals and appointments  In addition, I have reviewed and discussed with patient certain preventive protocols, quality metrics, and best practice recommendations. A written personalized care plan for preventive services as well as general preventive health recommendations were provided to patient.     Willette Brace, LPN   6/71/2458   Nurse Notes: Pt is taking diclofenac and asking if he adds the cream in with dosage is this too much diclofenac? Pt is also expressing an increase in hip pain and wanted therapy , however was unsure if insurance would cover. Pt also expressed a need for continuous monitoring for a blood sugar device would be more affective for him please advise

## 2022-03-07 ENCOUNTER — Ambulatory Visit (INDEPENDENT_AMBULATORY_CARE_PROVIDER_SITE_OTHER): Payer: Medicare HMO | Admitting: Pharmacist

## 2022-03-07 DIAGNOSIS — E1165 Type 2 diabetes mellitus with hyperglycemia: Secondary | ICD-10-CM

## 2022-03-07 DIAGNOSIS — K219 Gastro-esophageal reflux disease without esophagitis: Secondary | ICD-10-CM

## 2022-03-07 DIAGNOSIS — I10 Essential (primary) hypertension: Secondary | ICD-10-CM

## 2022-03-07 DIAGNOSIS — E785 Hyperlipidemia, unspecified: Secondary | ICD-10-CM

## 2022-03-07 DIAGNOSIS — R972 Elevated prostate specific antigen [PSA]: Secondary | ICD-10-CM

## 2022-03-07 NOTE — Chronic Care Management (AMB) (Signed)
Chronic Care Management Pharmacy Note  03/07/2022 Name:  Timothy Chapman MRN:  [id number] DOB:  September 24, 1945  Summary: Type 2 DM: Patient reports he has not been checking blood glucose recently. Still taking Ozempic 461m weekly and metformin 10019mtwice a day. He reports that he feels that Ozempic is not helping as much with weight loss / appetite control. He is due to have A1c rechecked. Made appointment with PCP for March 26, 2022. If A1c > 8.0% consider switch to [facility] add Jardiance. Though patient was not able to take Farxiga due to increase in genital infections.  VeRegions Financial Corporationill require prior authorization for MoLennar CorporationCriteria - All of the following must be met (1) Diagnosis of Type 2 diabetes (2) Inadequate glycemic control on at least 61m32mf semaglutide injection plus two or more glucose lowering drugs (metformin, empagliflozin, insulin, pioglitazone, sulfonylurea) for at least 6 months (3) Change needed to achieve goal A1C is less than 1%  Patient asked about Continuous Glucose Monitor - he is not currently a candidate for Continuous Glucose Monitor as he does not take insulin and has not experienced blood glucose < 54. Hypertension: blood pressure goal < 140/90. Has not checked blood pressure at home recently. Recommended continue current therapy and restart checking blood pressure 1 to 2 times per week. Record for upcoming appointment with PCP Hyperlipidemia: Last LDL and triglycerides were not at goal. Due to check Lipids at upcoming appointment. He endorsed that he has been taking Praluent regularly now that he has assistance with cost thru [place] / elevated PSA:  Patient reports he feels that his testosterone is low. Last checked in 2018. He has also had elevated PSA but MR scan did not show signs of cancer. [facility] would like second opinion regarding treatment for enlarged prostate. Dr DavRosana Hoess  recommended TURP. Patient to discuss referral to different urologist with Dr CopLorelei Pont would think that urology should be consulted regarding low testosterone given history of elevated PSA.  Medication management: reviewed med list and refill history. Medication adherence has improved over the last year. Patient did mention that he started an herbal supplement TonKenard GoweruOwensboro Health [facility]len[facility] HospitalnVivi MartensChecked into efficacy, potential side effects and safety. Most of the alleged health benefits of tongkat aliDeatra Cantere not well researched, but some studies suggest that it may help treat male infertility, improve mood, and increase muscle mass. Studies show that doses of 200 to 400m57mr day are safe but large doses (>2000mg86m can alter the DNA of gastrointestinal tissue. Patient report he has taking 500mcg88me (= 0.5mg)  50mubjective: Timothy JOAN AVETISYAN76 y.o.73ear old male who is a primary patient of Copland, JessicaGay FillerThe CCM team was consulted for assistance with disease management and care coordination needs.    Engaged with patient by telephone for follow up visit in response to provider referral for pharmacy case management and/or care coordination services.   Consent to Services:  The patient was given information about Chronic Care Management services, agreed to services, and gave verbal consent prior to initiation of services.  Please see initial visit note for detailed documentation.   Patient Care Team: Copland, JessicaGay Filler PCP - General (Family Medicine) Vanderbilt Ranieri,Cherre RobinsPP (Pharmacist)  Recent office visits: 11/15/2021 - Fam Med (Dr CoplandLorelei Pontfor follow up. Labs checked. Will be traveling to [place] for about 2 months soon.  Provided Rx for doxycycline and Typhoid Vaccine. F/U 3 to 4 months. 08/17/2021 - Patient Message - Dr Lorelei Pont. Encouraged adherence to Praulent and Niacin due to elevated Tg and LDL. Recommended limiting intake of food high in potassium 08/16/2021 -  Fam Med (Dr Lorelei Pont) F/U HTN and DM. Noted left lateral abdominal pain. CT of abdomen ordered.     Recent consult visits: 07/13/2021 - MR of prostate at [place]. No convincing MR evidence of a clinically significant focal prostate cancer. Attention on followup as indicated.  06/28/2021 - Neurology (Dr Billey Gosling) evaluation of neuropathy and falls.He has tried multiple treatments for neuropathy previously and is not interested in trying any more medications. Checked labs and recommended continue physical therapy.  06/27/2021 - Ophthalmology at [facility]. 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 Received Eylea injection in left eye 06/21/2021 - Urology ([facility]) f/u prostate cancer and his elevated serum PSA value. I will repeat PSA today. Ordered repeat MRI. Also discussed repeat prostate biopsy given his history of prostate cancer.   Hospital visits: None in previous 6 months  Objective:  Lab Results  Component Value Date   CREATININE 1.23 11/15/2021   CREATININE 1.43 08/16/2021   CREATININE 1.44 04/26/2021    Lab Results  Component Value Date   HGBA1C 9.0 (H) 11/15/2021   Last diabetic Eye exam:  Lab Results  Component Value Date/Time   HMDIABEYEEXA Retinopathy (A) 06/27/2021 12:00 AM    Last diabetic Foot exam: No results found for: "HMDIABFOOTEX"      Component Value Date/Time   CHOL 175 08/16/2021 1058   TRIG (H) 08/16/2021 1058    475.0 Triglyceride is over 400; calculations on Lipids are invalid.   HDL 33.30 (L) 08/16/2021 1058   CHOLHDL 5 08/16/2021 1058   VLDL 57.0 (H) 07/09/2017 0826   LDLCALC  07/06/2020 1448     Comment:     . LDL cholesterol not calculated. Triglyceride levels greater than 400 mg/dL invalidate calculated LDL results. . Reference range: <100 . Desirable range <100 mg/dL for primary prevention;   <70 mg/dL for patients with CHD or diabetic patients  with > or = 2 CHD risk factors. Marland Kitchen LDL-C is now  calculated using the Martin-Hopkins  calculation, which is a validated novel method providing  better accuracy than the Friedewald equation in the  estimation of LDL-C.  Cresenciano Genre et al. Annamaria Helling. 3244;010(27): 2061-2068  (http://education.QuestDiagnostics.com/faq/FAQ164)    LDLDIRECT 106.0 08/16/2021 1058       Latest Ref Rng & Units 08/16/2021   10:58 AM 06/28/2021   11:16 AM 04/26/2021   12:01 PM  Hepatic Function  Total Protein 6.0 - 8.3 g/dL 7.3  7.9  7.5   Albumin 3.5 - 5.2 g/dL 4.3   4.2   AST 0 - 37 U/L 26   32   ALT 0 - 53 U/L 32   33   Alk Phosphatase 39 - 117 U/L 36   48   Total Bilirubin 0.2 - 1.2 mg/dL 0.4   0.4     No results found for: "TSH", "FREET4"     Latest Ref Rng & Units 11/15/2021   10:56 AM 04/26/2021   12:01 PM 07/06/2020    2:48 PM  CBC  WBC 4.0 - 10.5 K/uL 7.3  6.9  8.9   Hemoglobin 13.0 - 17.0 g/dL 12.3  12.1  13.3   Hematocrit 39.0 - 52.0 % 38.2  36.5  40.7  Platelets 150.0 - 400.0 K/uL 293.0  232.0  247     No results found for: "VD25OH"  Clinical ASCVD: Yes  The 10-year ASCVD risk score (Arnett DK, et al., 2019) is: 52.8%   Values used to calculate the score:     Age: 80 years     Sex: Male     Is Non-Hispanic African American: No     Diabetic: Yes     Tobacco smoker: No     Systolic Blood Pressure: 450 mmHg     Is BP treated: Yes     HDL Cholesterol: 33.3 mg/dL     Total Cholesterol: 175 mg/dL     Social History   Tobacco Use  Smoking Status Former   Types: Cigarettes   Quit date: 09/18/1975   Years since quitting: 46.4  Smokeless Tobacco Never   BP Readings from Last 3 Encounters:  11/15/21 (!) 142/88  08/16/21 130/84  06/28/21 (!) 142/84   Pulse Readings from Last 3 Encounters:  11/15/21 97  08/16/21 68  06/28/21 64   Wt Readings from Last 3 Encounters:  11/15/21 240 lb (108.9 kg)  08/16/21 237 lb 12.8 oz (107.9 kg)  06/28/21 232 lb (105.2 kg)    Assessment: Review of patient past medical history, allergies,  medications, health status, including review of consultants reports, laboratory and other test data, was performed as part of comprehensive evaluation and provision of chronic care management services.   SDOH:  (Social Determinants of Health) assessments and interventions performed:     CCM Care Plan  Allergies  Allergen Reactions   Codeine     hallucinations   Farxiga [Dapagliflozin] Other (See Comments)    Genital infection and irritation   Statins     Has tried several cannot tolerate    Medications Reviewed Today     Reviewed by Cherre Robins, RPH-CPP (Pharmacist) on 03/07/22 at 1412  Med List Status: <None>   Medication Order Taking? Sig Documenting Provider Last Dose Status Informant  Acetylcarnitine HCl 500 MG CAPS [id number] Yes Take 500 mg by mouth daily. [provider] Taking Active Self           Med Note Rosemarie Beath, MELISSA B   Thu Nov 21, 2017 12:03 PM)    Alcohol Swabs (B-D SINGLE USE SWABS REGULAR) PADS [id number] Yes USE PRIOR TO INSULIN DOSE Copland, Gay Filler, MD Taking Active   Alirocumab (PRALUENT) 75 MG/ML Darden Palmer [id number] Yes INJECT 75MG INTO THE SKIN EVERY 14 DAYS Copland, Gay Filler, MD Taking Active   Alpha-Lipoic Acid 600 MG CAPS [id number] Yes Take 600 mg by mouth daily. [provider] Taking Active Self           Med Note Antony Contras, Paquita Printy B   Tue Apr 04, 2021  1:16 PM)    aspirin 81 MG chewable tablet [id number] Yes Chew 1 tablet (81 mg total) by mouth daily. Shelly Coss, MD Taking Active Self  benazepril (LOTENSIN) 40 MG tablet [id number] Yes Take 1 tablet (40 mg total) by mouth in the morning and at bedtime.  Patient taking differently: Take 40 mg by mouth 2 (two) times daily.   Copland, Gay Filler, MD Taking Active   diclofenac (VOLTAREN) 75 MG EC tablet [id number] Yes Take one BID prn joint pain Copland, Gay Filler, MD Taking Active   famotidine (PEPCID) 20 MG tablet [id number] Yes Take 1 tablet (20 mg total) by mouth 2 (two) times daily.  Copland, Gay Filler, MD Taking Active   Lancets [facility]  DELICA PLUS [id number]) Dante [id number] Yes USE TWO TIMES A DAY TO     CHECK BLOOD SUGAR Copland, Gay Filler, MD Taking Active   melatonin 5 MG TABS [id number] Yes Take 5 mg by mouth at bedtime.  [provider] Taking Active Self  metFORMIN (GLUCOPHAGE-XR) 500 MG 24 hr tablet [id number] Yes TAKE 2 TABLETS TWICE A DAY  WITH FOOD Copland, Gay Filler, MD Taking Active   Multiple Vitamin (MULTIVITAMIN) tablet [id number] Yes Take 1 tablet by mouth daily. [provider] Taking Active Self  niacin (NIASPAN) 1000 MG CR tablet [id number] Yes Take 1,000 mg by mouth at bedtime. [provider] Taking Active            Med Note Antony Contras, Adria Dill Nov 23, 2021  2:06 PM) Patient is taking over-the-counter niacin  omeprazole (PRILOSEC) 40 MG capsule [id number] Yes Take 1 capsule (40 mg total) by mouth daily. Copland, Gay Filler, MD Taking Active   [facility] VERIO test strip [id number] Yes USE AS INSTRUCTED Copland, Gay Filler, MD Taking Active   OVER THE COUNTER MEDICATION [id number] Yes Stinging nettle daily for prostate [provider] Taking Active   Semaglutide, 2 MG/DOSE, 8 MG/3ML SOPN [id number] Yes Inject 2 mg as directed once a week. Copland, Gay Filler, MD Taking Active   tadalafil (CIALIS) 10 MG tablet [id number] Yes Take 1 tablet (10 mg total) by mouth daily as needed for erectile dysfunction. Max 20 mg total per day Copland, Gay Filler, MD Taking Active   tadalafil (CIALIS) 5 MG tablet [id number] Yes Take 1 tablet (5 mg total) by mouth daily. Copland, Gay Filler, MD Taking Active   vitamin C (ASCORBIC ACID) 500 MG tablet [id number] Yes Take 500 mg by mouth daily. 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   Paroxysmal atrial fibrillation ([place]) 12/16/2017   SBO (small bowel obstruction) ([place]) 10/08/2017   ATN (acute tubular  necrosis) ([place]) 10/08/2017   Septic shock ([place]) 10/04/2017   Acute cholecystitis s/p perc cholecystomy drainage 10/04/2017    Acute respiratory failure with hypoxia (HCC)    Chronic back pain 02/08/2016   Controlled type 2 diabetes mellitus with diabetic neuropathy, without long-term current use of insulin ([place]) 02/08/2016   Insomnia 02/08/2016   History of diverticulitis 02/08/2016   HYPERTENSION, BENIGN ESSENTIAL, UNCONTROLLED 03/12/2009   ABNORMAL ELECTROCARDIOGRAM 03/12/2009    Immunization History  Administered Date(s) Administered   Fluad Quad(high Dose 65+) 05/28/2019, 07/06/2020   Influenza, High Dose Seasonal PF 06/20/2016, 07/08/2017   Influenza-Unspecified 08/12/2021   PFIZER Comirnaty(Gray Top)Covid-19 Tri-Sucrose Vaccine 11/15/2019, 12/15/2019, 09/02/2020   PFIZER(Purple Top)SARS-COV-2 Vaccination 11/15/2019, 12/15/2019   Pfizer Covid-19 Vaccine Bivalent Booster 21yr & up 08/16/2021   Pneumococcal Conjugate-13 06/20/2016   Pneumococcal Polysaccharide-23 02/06/2018, 05/28/2019   Pneumococcal-Unspecified 12/20/2011, 05/18/2012   Td 11/15/2021   Tdap 09/18/2011, 10/31/2011   Zoster Recombinat (Shingrix) 02/19/2020, 05/03/2020   Conditions to be addressed/monitored: HTN, HLD, DMII, GERD, BPH, and arthritis pain; ED / low testosterone, elevated PSA  Care Plan : General Pharmacy (Adult)  Updates made by ECherre Robins RPH-CPP since 03/07/2022 12:00 AM     Problem: Chronic Disease Management support, education, and care coordination needs related to HTN, Hyperlipidemia, Diabetes, Insomnia, GERD, Pain, BPH/ED   Note:   Current Barriers:  Unable to maintain control of type 2 DM Does not notify physician's office if  has questions or concerns In need of Chronic Disease Management support, education, and care coordination related to Hypertension, Hyperlipidemia, Diabetes, GERD (with Barrett's Esophagus); NASH; Pain, BPH/ED History of low adherence to maintenance medications  -improving Unable to afford therapy for hyperlipidemia (Praluent)   Pharmacist Clinical Goal(s):  Over the next 180 days, patient will verbalize ability to afford treatment regimen achieve adherence to monitoring guidelines and medication adherence to achieve therapeutic efficacy achieve control of diabetes and hypertension as evidenced by A1c < 7.0% and BP <140/90 adhere to prescribed medication regimen as evidenced by refill history and meeting of goal mentioned below  through collaboration with PharmD and provider.  Better understand medication regimen and improve adherence to medication therapy Maintain communication with PCP's office   Interventions: 1:1 collaboration with Copland, Gay Filler, MD regarding development and update of comprehensive plan of care as evidenced by provider attestation and co-signature Inter-disciplinary care team collaboration (see longitudinal plan of care) Comprehensive medication review performed; medication list updated in electronic medical record   Diabetes: Uncontrolled - A1c goal <7.0% A1c is not at goal but improved from 10.6% 02/10/2021 to 9.7% 04/26/2021 and 8.7% 07/2021 Current regimen:  Ozempic - inject 76m weekly (dose increased 11/15/2021) Metformin XR 5063m- take 2 tablets = 100085mwice a day with morning and evening meals Was unable to tolerate FarIrancaused irritation and infection of genitals; Rybelsus - cost and more nasuea than Ozempic Has had 1 dose of Ozempic 2mg30md so far is tolerating. [facility] was high normal at 4.06 Patient reports recent home BG: ranging from 115 to 210 Denies BG <80 / hypoglycemic symptoms [facility] from Last 3 Encounters:  11/15/21 240 lb (108.9 kg)  08/16/21 237 lb 12.8 oz (107.9 kg)  06/28/21 232 lb (105.2 kg)  Reviewed diet - has been slow to restart low carbohydrate diet after not following in November and December  2022 Exercise: using treadmill a few times per week Interventions: Discussed A1c and blood glucose goals Reviewed home blood glucose readings and reviewed goals. Recommended he restart checking blood glucose daily (patient is not candidate for Continuous Glucose Monitor at this time)  Fasting blood glucose goal (before meals) = 80 to 130 Blood glucose goal after a meal = less than 180  Continue with current medication. If A1c still > 8.0 at next visit, consider long acting insulin addition or Jardiance or switch Ozempic to MounLennar CorporationeteRegions Financial Corporationl require prior authorization for MounLennar Corporationiteria - All of the following must be met (1) Diagnosis of Type 2 diabetes (2) Inadequate glycemic control on at least 1mg 59msemaglutide injection plus two or more glucose lowering drugs (metformin, empagliflozin, insulin, pioglitazone, sulfonylurea) for at least 6 months (3) Change needed to achieve goal A1C is less than 1% Made appointment with PCP for March 26, 2022.  Restart low carbohydrate diet and exercise at least 150 minutes per week.   Hypertension: BP Readings from Last 3 Encounters:  11/15/21 (!) 142/88  08/16/21 130/84  06/28/21 (!) 142/84  Blood pressure slightly above at last office visit. Home blood pressure has been lower.  Goal BP <140/90 Current regimen:  Benazepril 40mg 15me a day  Home blood pressure: 132 to 140 / 80's Previous medications tried: amlodipine - stopped due to dizziness; losartan-hydrochlorothiazide - patient concerned for hydrochlorothiazide side effects and did not feel that he needed it because his "ankles are skinny  and shriveled up" Patient reports he was falling a lot when he was taking amlodipine; Dr [name] with [facility] stopped amlodipine in May and patient reports he is no longer falling.  He saw neurology for dizziness / neuropathy 06/2021. No cause of dizziness noted. No further follow up recommended unless patient would like to pursue  treatment of neuropathy.  Interventions: Recommended patient check blood pressure 1 to 3 times per week, document, and provide at future appointments  Discussed blood pressure goal Discussed diet and exercise Ensure daily salt intake < 2300 mg/day Continue current regimen to lower blood pressure   Hyperlipidemia with elevated triglycerides LDL nor triglycerides at goal;  Triglycerides have improved from 815 to 475. HDLC also improaved form 25 to 33.  LDL goal < 100 and Triglycerides <150 Current regimen:  Niaspan CR 1036m at bedtime - take aspirin 858mprior to niaspan to decrease flushing (prescription niacin too expensive patient taking over-the-counter)  Salmon Oil 100071mwice daily per [facility]  Praluent 3m58mery 14 days  Patient has VA benefits - contacted VA to see if Praluent is on formulary. [place] Patient states he [place] to discuss with [place] at last visit but did not have time to address.  Approved for hypercholesterolemia funds from [facility] 20[facility] - $2500 available for year. Patient reports [facility] did not use [employer] funds last month when filled and cost was $47.  Patient denies alcohol use Interventions: Discussed lipid goals and risk associated with elevated trilycerides Reviewed foods that can increase triglycerides; encouraged patient to continue to limit intake of alcohol, sweets / sugar, potatoes, bread, rice and pasta (addressed at previous visit) Encouraged increased physical activity  Chronic Back Pain / Osteoarthritis Controlled Current regimen:  Alpha lipoic acid 600mg67mly  Diclofenac 3mg 63mo twice a day if needed for pain Patient self care activities - Over the next 90 days, patient will: Maintain medication regimen for arthritis    GERD Controlled Current regimen:  Famotidine 20mg t37m daily Omeprazole 40mg da12mat bedtime Patient self care activities - Over the next 90 days, patient  will: Take omeprazole in morning on empty stomach and take famotidine at bedtime Sent in prescription for famotidine to [facility] for 90 days at patient request  Insomnia:  Current Regimen:  Melatonin 5mg at b71mime Patient reports he is sleeping fairly well at night.  He does have a 1 year ol78 at home who is not sleeping through the night most nights.  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  Interventions: none Continue current regimen for sleep  BPH with elevated PSA / Low testosterone; Patient has seen urologist regarding elevated PSA; Dr Davis hasRosana Hoesmmended TURP but patient declined. Patient would like second opinion regarding treatment for enlarged prostate.  Had MRI of prostate 07/13/2021 - radiology ([facility] no evidence of a clinically significant focal prostate cancer. Attention on followup as indicated.  Patient reports he feels that his testosterone is low. Last checked in 2018 Interventions:  Discussed risk versus benefits of testosterone replacement in light of elevated prostate.  Patient to discuss further with PCP at appointment 03/26/2022  Medication management [facility] Has some [facility] benefits Interventions Comprehensive medication review performed. Medication list updated Reviewed refill history and discussed adherence with patient. Continue current medication management strategy Discussed risk versus benefits of Tongkat ABrunswick Corporationnt.   Patient Goals/Self-Care Activities Over the  next 180 days, patient will:  take medications as prescribed,  Restart checking glucose daily , document, and provide at future appointments,  check blood pressure 1 to 3 times per week, document, and provide at future appointments, and  collaborate with provider on medication access solutions Made follow up appointment with Dr Lorelei Pont for March 26, 2022 at [place].   Follow Up Plan: Telephone follow up appointment  with care management team member scheduled for:  2 to 3 months              Medication Assistance: States he cannot afford Praluent - has not taken in 4 months. Applied for [name] assistance with Praluent and any medication for hypercholesterolemia. Patient was approved for $2500 from 08/28/2021 thru 08/27/2022  Patient's preferred pharmacy is:  [place], [place] - [address] [place] [address] Phone: [phone] Fax: [phone]  [facility],  to [facility] [place] [address] Phone: [phone] Fax: [place] [phone] - [place], [place] [address] [place] [address] Phone: [phone] Fax: [phone]  [id number] [place] [place] [facility] [id number] [id number]: [phone]: [phone]  Follow Up:  Patient agrees to Care Plan and Follow-up.  Plan: Telephone follow up appointment with care management team member scheduled for:  3 months; PCP - 1 month  TCherre Robins PharmD Clinical Pharmacist LRussells PointPrimary Care SW [facility]

## 2022-03-07 NOTE — Patient Instructions (Signed)
Mr. Nazaire It was a pleasure speaking with you  Below is a summary of your health goals and care plan  Patient Goals/Self-Care Activities take medications as prescribed,  Restart checking glucose daily , document, and provide at future appointments,  check blood pressure 1 to 3 times per week, document, and provide at future appointments, and  collaborate with provider on medication access solutions Made follow up appointment with Dr Lorelei Pont for March 26, 2022 at [place].     If you have any questions or concerns, please feel free to contact me either at the phone number below or with a MyChart message.   Keep up the good work!  Cherre Robins, PharmD Clinical Pharmacist [place] [phone] (direct line)  [phone] (main office number)   Patient verbalizes understanding of instructions and care plan provided today and agrees to view in [place]. Active MyChart status and patient understanding of how to access instructions and care plan via MyChart confirmed with patient.

## 2022-03-16 DIAGNOSIS — I10 Essential (primary) hypertension: Secondary | ICD-10-CM

## 2022-03-16 DIAGNOSIS — M1990 Unspecified osteoarthritis, unspecified site: Secondary | ICD-10-CM

## 2022-03-16 DIAGNOSIS — E1159 Type 2 diabetes mellitus with other circulatory complications: Secondary | ICD-10-CM | POA: Diagnosis not present

## 2022-03-16 DIAGNOSIS — E785 Hyperlipidemia, unspecified: Secondary | ICD-10-CM

## 2022-03-16 DIAGNOSIS — Z7985 Long-term (current) use of injectable non-insulin antidiabetic drugs: Secondary | ICD-10-CM

## 2022-03-16 DIAGNOSIS — N400 Enlarged prostate without lower urinary tract symptoms: Secondary | ICD-10-CM | POA: Diagnosis not present

## 2022-03-16 DIAGNOSIS — Z7984 Long term (current) use of oral hypoglycemic drugs: Secondary | ICD-10-CM

## 2022-03-18 NOTE — Progress Notes (Signed)
[facility] [address], [place], [place] [address] [phone]  Date:  03/26/2022   Name:  Timothy Chapman   DOB:  1946/09/10   MRN:  [id number]  PCP:  Timothy Mclean, MD    Chief Complaint: Follow-up (Concerns/ questions: none)   History of Present Illness:  Timothy Chapman is a 76 y.o. very pleasant male patient who presents with the following:  [name] The trip was ok but tiring for them   History of diabetes, hypertension, chronic back pain, severe illness in 2019-he became ill with sepsis due to cholangitis, had acute respiratory failure and required intubation He receives some care through the [place] History of prostate cancer currently on active surveillance   Married to [facility], their son is 3-year-old PSA is managed by urology - his urology care has been though the [place] but he would like to establish care in the private sector, his urologist with the [facility] moved away  On further review, he is not 100% sure if he truly had prostate cancer.  He reports being told different things at different times.  He notes he feels very tired and his Cialis is not working, he would like to go on testosterone replacement if it was safe  Last A1c was higher than expected- recheck today He may check his glucose on occasion- may run under about 150  He notes his sleep is not great- he may wake up on frequent basis, he did a sleep test years ago and was treated with CPAP but did not tolerate well  Therefore, we will hold off on revisiting this issue as he did not tolerate treatment  Wt Readings from Last 3 Encounters:  03/26/22 238 lb 6.4 oz (108.1 kg)  11/15/21 240 lb (108.9 kg)  08/16/21 237 lb 12.8 oz (107.9 kg)   Mild CRI is stable  Patient Active Problem List   Diagnosis Date  Noted   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   Paroxysmal atrial fibrillation ([place]) 12/16/2017   SBO (small bowel obstruction) ([place]) 10/08/2017   ATN (acute tubular necrosis) ([place]) 10/08/2017   Septic shock ([place]) 10/04/2017   Acute cholecystitis s/p perc cholecystomy drainage 10/04/2017    Acute respiratory failure with hypoxia (HCC)    Chronic back pain 02/08/2016   Controlled type 2 diabetes mellitus with diabetic neuropathy, without long-term current use of insulin ([place]) 02/08/2016   Insomnia 02/08/2016   History of diverticulitis 02/08/2016   HYPERTENSION, BENIGN ESSENTIAL, UNCONTROLLED 03/12/2009   ABNORMAL ELECTROCARDIOGRAM 03/12/2009    Past Medical History:  Diagnosis Date   AKI (acute kidney injury) ([place])    Anxiety    Arthritis    Barrett esophagus    Depression    Diabetes mellitus without complication ([place])    type 2   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   Cannot tolerate statins   GERD (gastroesophageal reflux disease)    Hypertension    Neuromuscular disorder (HCC)    neuropathy but being treated   PONV (postoperative nausea and vomiting)    Sleep apnea    has a sleep study but refuses that he has sleep study, not wearing CPAP    Past Surgical History:  Procedure Laterality Date   BACK SURGERY     X2 - lower   CHOLECYSTECTOMY  N/A 12/20/2017   Procedure: LAPAROSCOPIC CHOLECYSTECTOMY WITH INTRAOPERATIVE CHOLANGIOGRAM POSSIBLE OPEN;  Surgeon: Fanny Skates, MD;  Location: [facility];  Service: General;  Laterality: N/A;   COLONOSCOPY     ESOPHAGOGASTRODUODENOSCOPY     IR RADIOLOGIST EVAL & MGMT  10/30/2017   IR RADIOLOGIST EVAL & MGMT  11/14/2017   PROSTATE SURGERY  2017   uro lift per pt.   REPLACEMENT TOTAL KNEE Left    ROTATOR CUFF REPAIR     Bil    Social History   Tobacco Use   Smoking status: Former    Types: Cigarettes    Quit date: 09/18/1975    Years since quitting: 46.5   Smokeless tobacco: Never  Vaping Use   Vaping Use: Never used  Substance Use  Topics   Alcohol use: Not Currently    Alcohol/week: 0.0 standard drinks of alcohol    Comment: Rarely   Drug use: Never    Family History  Problem Relation Age of Onset   Heart failure Father    Heart disease Father    Emphysema Mother    Colon cancer Maternal Grandmother     Allergies  Allergen Reactions   Codeine     hallucinations   Farxiga [Dapagliflozin] Other (See Comments)    Genital infection and irritation   Statins     Has tried several cannot tolerate    Medication list has been reviewed and updated.  Current Outpatient Medications on File Prior to Visit  Medication Sig Dispense Refill   Acetylcarnitine HCl 500 MG CAPS Take 500 mg by mouth daily.     Alcohol Swabs (B-D SINGLE USE SWABS REGULAR) PADS USE PRIOR TO INSULIN DOSE 200 each 7   Alirocumab (PRALUENT) 75 MG/ML SOAJ INJECT '75MG'$  INTO THE SKIN EVERY 14 DAYS 2 mL 11   Alpha-Lipoic Acid 600 MG CAPS Take 600 mg by mouth daily.     aspirin 81 MG chewable tablet Chew 1 tablet (81 mg total) by mouth daily. 30 tablet 0   benazepril (LOTENSIN) 40 MG tablet Take 1 tablet (40 mg total) by mouth in the morning and at bedtime. (Patient taking differently: Take 40 mg by mouth 2 (two) times daily.) 180 tablet 3   diclofenac (VOLTAREN) 75 MG EC tablet Take one BID prn joint pain 180 tablet 3   famotidine (PEPCID) 20 MG tablet Take 1 tablet (20 mg total) by mouth 2 (two) times daily. 180 tablet 3   Lancets (ONETOUCH DELICA PLUS [id number]) MISC USE TWO TIMES A DAY TO     CHECK BLOOD SUGAR 100 each 3   melatonin 5 MG TABS Take 5 mg by mouth at bedtime.      metFORMIN (GLUCOPHAGE-XR) 500 MG 24 hr tablet TAKE 2 TABLETS TWICE A DAY  WITH FOOD 360 tablet 3   Multiple Vitamin (MULTIVITAMIN) tablet Take 1 tablet by mouth daily.     niacin (NIASPAN) 1000 MG CR tablet Take 1,000 mg by mouth at bedtime.     omeprazole (PRILOSEC) 40 MG capsule Take 1 capsule (40 mg total) by mouth daily. 90 capsule 3   ONETOUCH VERIO test strip USE AS  INSTRUCTED 100 strip 7   OVER THE COUNTER MEDICATION Stinging nettle daily for prostate     OVER THE COUNTER MEDICATION Take 1 tablet by mouth daily. Tongkat Ali     Semaglutide, 2 MG/DOSE, 8 MG/3ML SOPN Inject 2 mg as directed once a week. 9 mL 1   tadalafil (CIALIS) 10 MG tablet Take  1 tablet (10 mg total) by mouth daily as needed for erectile dysfunction. Max 20 mg total per day 90 tablet 2   tadalafil (CIALIS) 5 MG tablet Take 1 tablet (5 mg total) by mouth daily. 90 tablet 2   vitamin C (ASCORBIC ACID) 500 MG tablet Take 500 mg by mouth daily.     VITAMIN D-VITAMIN K PO Take 1 tablet by mouth daily.     zinc gluconate 50 MG tablet Take 50 mg by mouth daily.     No current facility-administered medications on file prior to visit.    Review of Systems:  As per HPI- otherwise negative.   Physical Examination: Vitals:   03/26/22 0909  BP: (!) 144/80  Pulse: 91  Resp: 18  Temp: 97.9 F (36.6 C)  SpO2: 95%   Vitals:   03/26/22 0909  Weight: 238 lb 6.4 oz (108.1 kg)  Height: '5\' 9"'$  (1.753 m)   Body mass index is 35.21 kg/m. Ideal Body Weight: Weight in (lb) to have BMI = 25: 168.9  GEN: no acute distress. Obese, looks well  HEENT: Atraumatic, Normocephalic.  Ears and Nose: No external deformity. CV: RRR, No M/G/R. No JVD. No thrill. No extra heart sounds. PULM: CTA B, no wheezes, crackles, rhonchi. No retractions. No resp. distress. No accessory muscle use. ABD: S, NT, ND, +BS. No rebound. No HSM. EXTR: No c/c/e PSYCH: Normally interactive. Conversant.    Assessment and Plan: HYPERTENSION, BENIGN ESSENTIAL, UNCONTROLLED - Plan: CBC, Comprehensive metabolic panel, CT CARDIAC SCORING (SELF PAY ONLY), CANCELED: Basic metabolic panel  Dyslipidemia - Plan: CT CARDIAC SCORING (SELF PAY ONLY)  Uncontrolled type 2 diabetes mellitus with hyperglycemia, without long-term current use of insulin (HCC) - Plan: Hemoglobin A1c, CT CARDIAC SCORING (SELF PAY ONLY)  Other fatigue -  Plan: Testosterone Total,Free,Bio, Males, CBC, TSH, VITAMIN D 25 Hydroxy (Vit-D Deficiency, Fractures), Vitamin B12, CT CARDIAC SCORING (SELF PAY ONLY)  History of prostate cancer - Plan: Ambulatory referral to Urology  Mild anemia - Plan: Vitamin B12, Ferritin  Seen today for follow-up.  Blood pressures under reasonably good control Discussed risk factors, he would like to have a coronary calcium score.  We will set this up for him Follow-up on diabetes today, A1c pending Ordered lab work as above to evaluate fatigue.  I have counseled Tom that we will need to consult urology prior to considering testosterone replacement given his history of possible prostate cancer We will also check B12, ferritin, thyroid, vitamin D If lab work is all normal we can revisit the idea of trying to treat sleep apnea Signed Lamar Blinks, MD  Received labs as below, message to patient  Results for orders placed or performed in visit on 03/26/22  Hemoglobin A1c  Result Value Ref Range   Hgb A1c MFr Bld 8.5 (H) 4.6 - 6.5 %  CBC  Result Value Ref Range   WBC 6.3 4.0 - 10.5 K/uL   RBC 4.45 4.22 - 5.81 Mil/uL   Platelets 251.0 150.0 - 400.0 K/uL   Hemoglobin 12.4 (L) 13.0 - 17.0 g/dL   HCT 37.8 (L) 39.0 - 52.0 %   MCV 84.9 78.0 - 100.0 fl   MCHC 32.7 30.0 - 36.0 g/dL   RDW 14.6 11.5 - 15.5 %  Comprehensive metabolic panel  Result Value Ref Range   Sodium 137 135 - 145 mEq/L   Potassium 4.5 3.5 - 5.1 mEq/L   Chloride 105 96 - 112 mEq/L   CO2 21 19 - 32  mEq/L   Glucose, Bld 194 (H) 70 - 99 mg/dL   BUN 21 6 - 23 mg/dL   Creatinine, Ser 1.07 0.40 - 1.50 mg/dL   Total Bilirubin 0.3 0.2 - 1.2 mg/dL   Alkaline Phosphatase 37 (L) 39 - 117 U/L   AST 32 0 - 37 U/L   ALT 48 0 - 53 U/L   Total Protein 7.3 6.0 - 8.3 g/dL   Albumin 4.5 3.5 - 5.2 g/dL   GFR 68.77 >60.00 mL/min   Calcium 9.7 8.4 - 10.5 mg/dL  TSH  Result Value Ref Range   TSH 1.63 0.35 - 5.50 uIU/mL  VITAMIN D 25 Hydroxy (Vit-D  Deficiency, Fractures)  Result Value Ref Range   VITD 72.83 30.00 - 100.00 ng/mL  Vitamin B12  Result Value Ref Range   Vitamin B-12 >1504 (H) 211 - 911 pg/mL  Ferritin  Result Value Ref Range   Ferritin 7.5 (L) 22.0 - 322.0 ng/mL

## 2022-03-18 NOTE — Patient Instructions (Addendum)
Good to see you again today. I will be in touch with your labs asap IF your T levels are low, treating may certainly help, but we need to figure out if this is ok from a ?prostate cancer standpoint Also consider revisiting the idea of treating your sleep apnea which may also help  I ordered coronary calcium CT for you- please stop at imaging on the ground floor and set this up for a convenient time  Assuming all is well let's visit in about 6 months

## 2022-03-26 ENCOUNTER — Ambulatory Visit (INDEPENDENT_AMBULATORY_CARE_PROVIDER_SITE_OTHER): Payer: Medicare HMO | Admitting: Family Medicine

## 2022-03-26 ENCOUNTER — Encounter: Payer: Self-pay | Admitting: Family Medicine

## 2022-03-26 VITALS — BP 144/80 | HR 91 | Temp 97.9°F | Resp 18 | Ht 69.0 in | Wt 238.4 lb

## 2022-03-26 DIAGNOSIS — I10 Essential (primary) hypertension: Secondary | ICD-10-CM

## 2022-03-26 DIAGNOSIS — D649 Anemia, unspecified: Secondary | ICD-10-CM | POA: Diagnosis not present

## 2022-03-26 DIAGNOSIS — E785 Hyperlipidemia, unspecified: Secondary | ICD-10-CM | POA: Diagnosis not present

## 2022-03-26 DIAGNOSIS — R5383 Other fatigue: Secondary | ICD-10-CM

## 2022-03-26 DIAGNOSIS — Z8546 Personal history of malignant neoplasm of prostate: Secondary | ICD-10-CM | POA: Diagnosis not present

## 2022-03-26 DIAGNOSIS — E1165 Type 2 diabetes mellitus with hyperglycemia: Secondary | ICD-10-CM

## 2022-03-26 LAB — CBC
HCT: 37.8 % — ABNORMAL LOW (ref 39.0–52.0)
Hemoglobin: 12.4 g/dL — ABNORMAL LOW (ref 13.0–17.0)
MCHC: 32.7 g/dL (ref 30.0–36.0)
MCV: 84.9 fl (ref 78.0–100.0)
Platelets: 251 10*3/uL (ref 150.0–400.0)
RBC: 4.45 Mil/uL (ref 4.22–5.81)
RDW: 14.6 % (ref 11.5–15.5)
WBC: 6.3 10*3/uL (ref 4.0–10.5)

## 2022-03-26 LAB — COMPREHENSIVE METABOLIC PANEL
ALT: 48 U/L (ref 0–53)
AST: 32 U/L (ref 0–37)
Albumin: 4.5 g/dL (ref 3.5–5.2)
Alkaline Phosphatase: 37 U/L — ABNORMAL LOW (ref 39–117)
BUN: 21 mg/dL (ref 6–23)
CO2: 21 mEq/L (ref 19–32)
Calcium: 9.7 mg/dL (ref 8.4–10.5)
Chloride: 105 mEq/L (ref 96–112)
Creatinine, Ser: 1.07 mg/dL (ref 0.40–1.50)
GFR: 68.77 mL/min (ref >60.00)
Glucose, Bld: 194 mg/dL — ABNORMAL HIGH (ref 70–99)
Potassium: 4.5 mEq/L (ref 3.5–5.1)
Sodium: 137 mEq/L (ref 135–145)
Total Bilirubin: 0.3 mg/dL (ref 0.2–1.2)
Total Protein: 7.3 g/dL (ref 6.0–8.3)

## 2022-03-26 LAB — TSH: TSH: 1.63 u[IU]/mL (ref 0.35–5.50)

## 2022-03-26 LAB — FERRITIN: Ferritin: 7.5 ng/mL — ABNORMAL LOW (ref 22.0–322.0)

## 2022-03-26 LAB — HEMOGLOBIN A1C: Hgb A1c MFr Bld: 8.5 % — ABNORMAL HIGH (ref 4.6–6.5)

## 2022-03-26 LAB — VITAMIN D 25 HYDROXY (VIT D DEFICIENCY, FRACTURES): VITD: 72.83 ng/mL (ref 30.00–100.00)

## 2022-03-26 LAB — VITAMIN B12: Vitamin B-12: >1504 pg/mL — ABNORMAL HIGH (ref 211–911)

## 2022-03-27 ENCOUNTER — Encounter: Payer: Self-pay | Admitting: Family Medicine

## 2022-03-27 LAB — TESTOSTERONE TOTAL,FREE,BIO, MALES
Albumin: 4.3 g/dL (ref 3.6–5.1)
Sex Hormone Binding: 8 nmol/L — ABNORMAL LOW (ref 22–77)
Testosterone: 102 ng/dL — ABNORMAL LOW (ref 250–827)

## 2022-03-30 ENCOUNTER — Telehealth (HOSPITAL_BASED_OUTPATIENT_CLINIC_OR_DEPARTMENT_OTHER): Payer: Self-pay

## 2022-04-04 MED ORDER — GLIPIZIDE 10 MG PO TABS: 10.0000 mg | ORAL_TABLET | Freq: Every day | ORAL | 3 refills | Status: DC

## 2022-04-04 NOTE — Addendum Note (Signed)
Addended by: Lamar Blinks C on: 04/04/2022 12:55 PM   Modules accepted: Orders

## 2022-04-09 ENCOUNTER — Encounter: Payer: Self-pay | Admitting: Family Medicine

## 2022-04-09 DIAGNOSIS — Z77011 Contact with and (suspected) exposure to lead: Secondary | ICD-10-CM

## 2022-04-09 DIAGNOSIS — Z77090 Contact with and (suspected) exposure to asbestos: Secondary | ICD-10-CM

## 2022-04-10 ENCOUNTER — Telehealth: Payer: Self-pay

## 2022-04-10 NOTE — Telephone Encounter (Signed)
FYI:   Approval number: [id number] Dates approved: 04/10/22 through 10/07/2022

## 2022-04-11 ENCOUNTER — Other Ambulatory Visit (HOSPITAL_BASED_OUTPATIENT_CLINIC_OR_DEPARTMENT_OTHER): Payer: Medicare HMO

## 2022-04-12 ENCOUNTER — Other Ambulatory Visit (INDEPENDENT_AMBULATORY_CARE_PROVIDER_SITE_OTHER): Payer: Medicare HMO

## 2022-04-12 ENCOUNTER — Ambulatory Visit (HOSPITAL_BASED_OUTPATIENT_CLINIC_OR_DEPARTMENT_OTHER)
Admission: RE | Admit: 2022-04-12 | Discharge: 2022-04-12 | Disposition: A | Discharge status: Home / Self Care | Payer: Medicare HMO | Source: Ambulatory Visit | Attending: Family Medicine | Admitting: Family Medicine

## 2022-04-12 DIAGNOSIS — R911 Solitary pulmonary nodule: Secondary | ICD-10-CM | POA: Insufficient documentation

## 2022-04-12 DIAGNOSIS — I2510 Atherosclerotic heart disease of native coronary artery without angina pectoris: Secondary | ICD-10-CM | POA: Diagnosis not present

## 2022-04-12 DIAGNOSIS — I700 Atherosclerosis of aorta: Secondary | ICD-10-CM | POA: Diagnosis not present

## 2022-04-12 DIAGNOSIS — Z77090 Contact with and (suspected) exposure to asbestos: Secondary | ICD-10-CM | POA: Diagnosis not present

## 2022-04-12 DIAGNOSIS — Z77011 Contact with and (suspected) exposure to lead: Secondary | ICD-10-CM | POA: Diagnosis not present

## 2022-04-12 DIAGNOSIS — I10 Essential (primary) hypertension: Secondary | ICD-10-CM | POA: Insufficient documentation

## 2022-04-12 DIAGNOSIS — R5383 Other fatigue: Secondary | ICD-10-CM | POA: Insufficient documentation

## 2022-04-12 DIAGNOSIS — E785 Hyperlipidemia, unspecified: Secondary | ICD-10-CM | POA: Insufficient documentation

## 2022-04-12 DIAGNOSIS — E1165 Type 2 diabetes mellitus with hyperglycemia: Secondary | ICD-10-CM | POA: Insufficient documentation

## 2022-04-14 ENCOUNTER — Encounter: Payer: Self-pay | Admitting: Family Medicine

## 2022-04-16 ENCOUNTER — Encounter: Payer: Self-pay | Admitting: Family Medicine

## 2022-04-16 LAB — LEAD, BLOOD (ADULT >= 16 YRS): Lead: <1 ug/dL (ref <3.5)

## 2022-04-17 ENCOUNTER — Other Ambulatory Visit (INDEPENDENT_AMBULATORY_CARE_PROVIDER_SITE_OTHER): Payer: Medicare HMO

## 2022-04-17 DIAGNOSIS — D649 Anemia, unspecified: Secondary | ICD-10-CM

## 2022-04-18 ENCOUNTER — Telehealth: Payer: Self-pay | Admitting: *Deleted

## 2022-04-18 ENCOUNTER — Other Ambulatory Visit: Payer: Self-pay | Admitting: Family Medicine

## 2022-04-18 ENCOUNTER — Encounter: Payer: Self-pay | Admitting: Family Medicine

## 2022-04-18 DIAGNOSIS — D649 Anemia, unspecified: Secondary | ICD-10-CM

## 2022-04-18 LAB — FECAL OCCULT BLOOD, IMMUNOCHEMICAL: Fecal Occult Bld: POSITIVE — AB

## 2022-04-18 NOTE — Telephone Encounter (Signed)
Received call from Margarita Grizzle at [facility] reporting positive IFOB.

## 2022-04-19 NOTE — Telephone Encounter (Signed)
JC has addressed with the pt.

## 2022-04-20 ENCOUNTER — Other Ambulatory Visit (HOSPITAL_BASED_OUTPATIENT_CLINIC_OR_DEPARTMENT_OTHER): Payer: Medicare HMO

## 2022-04-25 ENCOUNTER — Other Ambulatory Visit: Payer: Self-pay | Admitting: Family Medicine

## 2022-04-25 ENCOUNTER — Encounter: Payer: Self-pay | Admitting: Family Medicine

## 2022-04-25 DIAGNOSIS — R931 Abnormal findings on diagnostic imaging of heart and coronary circulation: Secondary | ICD-10-CM

## 2022-04-25 DIAGNOSIS — I2510 Atherosclerotic heart disease of native coronary artery without angina pectoris: Secondary | ICD-10-CM

## 2022-05-04 ENCOUNTER — Encounter: Payer: Self-pay | Admitting: Family Medicine

## 2022-05-09 ENCOUNTER — Ambulatory Visit (INDEPENDENT_AMBULATORY_CARE_PROVIDER_SITE_OTHER): Payer: Medicare HMO | Admitting: Pharmacist

## 2022-05-09 DIAGNOSIS — I10 Essential (primary) hypertension: Secondary | ICD-10-CM

## 2022-05-09 DIAGNOSIS — I2510 Atherosclerotic heart disease of native coronary artery without angina pectoris: Secondary | ICD-10-CM

## 2022-05-09 DIAGNOSIS — E785 Hyperlipidemia, unspecified: Secondary | ICD-10-CM

## 2022-05-09 DIAGNOSIS — E1165 Type 2 diabetes mellitus with hyperglycemia: Secondary | ICD-10-CM

## 2022-05-09 MED ORDER — FELODIPINE ER 2.5 MG PO TB24: 2.5000 mg | ORAL_TABLET | Freq: Every day | ORAL | 0 refills | Status: DC

## 2022-05-09 NOTE — Chronic Care Management (AMB) (Unsigned)
Chronic Care Management Pharmacy Note  05/09/2022 Name:  Timothy Chapman MRN:  [id number] DOB:  [birth year]  Summary: Type 2 DM: A1c has improved from 9.1% to 8.5% but not at goal. Patient has missed 3 doses of Ozempic because he has not been able to get form [facility]. He was getting with his Medicare Part D benefits but is not in coverage gap. Provided number for [name] diabetes fund - [phone]. I also provided a  sample of Ozempic 72m for 4 weeks. I have also given patent an application for medication assistance program for Ozempic but not sure that he will qualify since he has [facility] benefits.  [place] is to ask them to switch to Trulicty - I would recommended starting with 358mweekly and increase as needed and tolerate to 4.20m20meekly. [facility]. Though patient was not able to take Farxiga due to increase in genital infections. [employer] require prior authorization for [employer]riteria - All of the following must be met (1) Diagnosis of Type 2 diabetes (2) Inadequate glycemic control on at least 1mg10m semaglutide injection plus two or more glucose lowering drugs (metformin, empagliflozin, insulin, pioglitazone, sulfonylurea) for at least 6 months (3) Change needed to achieve goal A1C is less than 1%  Hypertension: blood pressure goal < 140/90. Patient report home SBP has abeen 160 to 180 recently. Current therapy is benazepril 40mg120mce a day. Added felodipine 2.20mg d76my. Continue checking blood pressure 1 to 2 times per week.  Hyperlipidemia: Last LDL at goal but triglycerides still elevated. Recent cornary calcium score was elevated. Pateint referred to cardiologist. He has not been able to tolerate statin in past. Taking Praluent every 14 days. Has assistance with cost thru [facility] / elevated PSA:  Testosterone low. He has history of elevated  PSA and prostate cancer. Has been referred to urologist for second opinion. Patient pushing for testosterone replacement. Explained to him again that there are risks with testosterone therapy and needs assessment from urologist.    Subjective: Timothy Chapman 75 y.o59year old male who is a primary patient of Copland, JessicGay Filler The CCM team was consulted for assistance with disease management and care coordination needs.    Engaged with patient by telephone for follow up visit in response to provider referral for pharmacy case management and/or care coordination services.   Consent to Services:  The patient was given information about Chronic Care Management services, agreed to services, and gave verbal consent prior to initiation of services.  Please see initial visit note for detailed documentation.   Patient Care Team: Copland, JessicGay Fillers PCP - General (Family Medicine) EckardCherre RobinsCPP (Pharmacist)  Recent office visits: 03/26/2022 - Fam Med (Dr CoplanLorelei Pontchronic conditions. Labs checked. A1c improved but still not at goal. Added glipizide 10mg -68mrt with 0.5 tablet = 20mg dai44m Referred to unrology with [facility] for second opinion regarding prostate and low testosterone.  11/15/2021 - Fam Med (Dr Copland)Lorelei Pontor follow up. Labs checked. Will be traveling to [place] for about 2 months soon. Provided Rx for doxycycline and Typhoid Vaccine. F/U 3 to 4 months.  Recent consult visits: 04/03/2022 - [facility] Salman -Alferd PateeeHeriberto Antiguafollow up DM and HTN. 07/13/2021 - MR of prostate at [facility] evidence  of a clinically significant focal prostate cancer. Attention on followup as indicated.  06/28/2021 - Neurology (Dr Billey Gosling) evaluation of neuropathy and falls.He has tried multiple treatments for neuropathy previously and is not interested in trying any more medications. Checked labs and recommended continue physical  therapy.  06/27/2021 - Ophthalmology at [facility]. 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 Received Eylea injection in left eye 06/21/2021 - Urology ([facility]) f/u prostate cancer and his elevated serum PSA value. I will repeat PSA today. Ordered repeat MRI. Also discussed repeat prostate biopsy given his history of prostate cancer.   Hospital visits: None in previous 6 months  Objective:  Lab Results  Component Value Date   CREATININE 1.07 03/26/2022   CREATININE 1.23 11/15/2021   CREATININE 1.43 08/16/2021    Lab Results  Component Value Date   HGBA1C 8.5 (H) 03/26/2022   Last diabetic Eye exam:  Lab Results  Component Value Date/Time   HMDIABEYEEXA Retinopathy (A) 06/27/2021 12:00 AM    Last diabetic Foot exam: No results found for: "HMDIABFOOTEX"      Component Value Date/Time   CHOL 175 08/16/2021 1058   TRIG (H) 08/16/2021 1058    475.0 Triglyceride is over 400; calculations on Lipids are invalid.   HDL 33.30 (L) 08/16/2021 1058   CHOLHDL 5 08/16/2021 1058   VLDL 57.0 (H) 07/09/2017 0826   LDLCALC  07/06/2020 1448     Comment:     . LDL cholesterol not calculated. Triglyceride levels greater than 400 mg/dL invalidate calculated LDL results. . Reference range: <100 . Desirable range <100 mg/dL for primary prevention;   <70 mg/dL for patients with CHD or diabetic patients  with > or = 2 CHD risk factors. Marland Kitchen LDL-C is now calculated using the Martin-Hopkins  calculation, which is a validated novel method providing  better accuracy than the Friedewald equation in the  estimation of LDL-C.  Cresenciano Genre et al. Annamaria Helling. 9509;326(71): 2061-2068  (http://education.QuestDiagnostics.com/faq/FAQ164)    LDLDIRECT 106.0 08/16/2021 1058       Latest Ref Rng & Units 03/26/2022    9:31 AM 08/16/2021   10:58 AM 06/28/2021   11:16 AM  Hepatic Function  Total Protein 6.0 - 8.3 g/dL 7.3  7.3  7.9   Albumin 3.5 - 5.2 g/dL 4.5  4.3    AST 0 - 37 U/L 32  26     ALT 0 - 53 U/L 48  32    Alk Phosphatase 39 - 117 U/L 37  36    Total Bilirubin 0.2 - 1.2 mg/dL 0.3  0.4      Lab Results  Component Value Date/Time   TSH 1.63 03/26/2022 09:31 AM       Latest Ref Rng & Units 03/26/2022    9:31 AM 11/15/2021   10:56 AM 04/26/2021   12:01 PM  CBC  WBC 4.0 - 10.5 K/uL 6.3  7.3  6.9   Hemoglobin 13.0 - 17.0 g/dL 12.4  12.3  12.1   Hematocrit 39.0 - 52.0 % 37.8  38.2  36.5   Platelets 150.0 - 400.0 K/uL 251.0  293.0  232.0     Lab Results  Component Value Date/Time   VD25OH 72.83 03/26/2022 09:31 AM    Clinical ASCVD: Yes  The 10-year ASCVD risk score (Arnett DK, et al., 2019) is: 49.8%*   Values used to calculate the score:     Age: 89 years     Sex: Male  Is Non-Hispanic African American: No     Diabetic: Yes     Tobacco smoker: No     Systolic Blood Pressure: 480 mmHg     Is BP treated: Yes     HDL Cholesterol: 43 mg/dL*     Total Cholesterol: 171 mg/dL*     * - Cholesterol units were assumed for this score calculation     Social History   Tobacco Use  Smoking Status Former   Types: Cigarettes   Quit date: 09/18/1975   Years since quitting: 46.6  Smokeless Tobacco Never   BP Readings from Last 3 Encounters:  03/26/22 (!) 144/80  11/15/21 (!) 142/88  08/16/21 130/84   Pulse Readings from Last 3 Encounters:  03/26/22 91  11/15/21 97  08/16/21 68   Wt Readings from Last 3 Encounters:  03/26/22 238 lb 6.4 oz (108.1 kg)  11/15/21 240 lb (108.9 kg)  08/16/21 237 lb 12.8 oz (107.9 kg)    Assessment: Review of patient past medical history, allergies, medications, health status, including review of consultants reports, laboratory and other test data, was performed as part of comprehensive evaluation and provision of chronic care management services.   SDOH:  (Social Determinants of Health) assessments and interventions performed:     CCM Care Plan  Allergies  Allergen Reactions   Codeine     hallucinations    Farxiga [Dapagliflozin] Other (See Comments)    Genital infection and irritation   Statins     Has tried several cannot tolerate    Medications Reviewed Today     Reviewed by Cherre Robins, RPH-CPP (Pharmacist) on 05/09/22 at [place]  Med List Status: <None>   Medication Order Taking? Sig Documenting Provider Last Dose Status Informant  Acetylcarnitine HCl 500 MG CAPS [id number] Yes Take 500 mg by mouth daily. [provider] Taking Active Self           Med Note Rosemarie Beath, MELISSA B   Thu Nov 21, 2017 12:03 PM)    Alcohol Swabs (B-D SINGLE USE SWABS REGULAR) PADS [id number] Yes USE PRIOR TO INSULIN DOSE Copland, Gay Filler, MD Taking Active   Alirocumab (PRALUENT) 75 MG/ML Darden Palmer [id number] Yes INJECT $RemoveBe'75MG'ynRCDzICh$  INTO THE SKIN EVERY 14 DAYS Copland, Gay Filler, MD Taking Active   Alpha-Lipoic Acid 600 MG CAPS [id number] Yes Take 600 mg by mouth daily. [provider] Taking Active Self           Med Note Antony Contras, Latona Krichbaum B   Tue Apr 04, 2021  1:16 PM)    aspirin 81 MG chewable tablet [id number] Yes Chew 1 tablet (81 mg total) by mouth daily. Shelly Coss, MD Taking Active Self  benazepril (LOTENSIN) 40 MG tablet [id number] Yes Take 1 tablet (40 mg total) by mouth in the morning and at bedtime.  Patient taking differently: Take 40 mg by mouth 2 (two) times daily.   Copland, Gay Filler, MD Taking Active   diclofenac (VOLTAREN) 75 MG EC tablet [id number] Yes Take one BID prn joint pain Copland, Gay Filler, MD Taking Active   famotidine (PEPCID) 20 MG tablet [id number] Yes Take 1 tablet (20 mg total) by mouth 2 (two) times daily. Copland, Gay Filler, MD Taking Active   glipiZIDE (GLUCOTROL) 10 MG tablet [id number] Yes Take 1 tablet (10 mg total) by mouth daily before breakfast. Copland, Gay Filler, MD Taking Active   Lancets (ONETOUCH DELICA PLUS RNHAFB90X) [place] [id number] Yes USE TWO TIMES A DAY TO     CHECK BLOOD SUGAR Copland,  Gay Filler, MD Taking Active   melatonin 5 MG TABS [id number] Yes Take 5 mg by  mouth at bedtime.  [provider] Taking Active Self  metFORMIN (GLUCOPHAGE-XR) 500 MG 24 hr tablet [id number] Yes TAKE 2 TABLETS TWICE A DAY  WITH FOOD Copland, Gay Filler, MD Taking Active   Multiple Vitamin (MULTIVITAMIN) tablet [id number] Yes Take 1 tablet by mouth daily. [provider] Taking Active Self  niacin (NIASPAN) 1000 MG CR tablet [id number] Yes Take 1,000 mg by mouth at bedtime. [provider] Taking Active            Med Note Antony Contras, Adria Dill Nov 23, 2021  2:06 PM) Patient is taking over-the-counter niacin  omeprazole (PRILOSEC) 40 MG capsule [id number] Yes Take 1 capsule (40 mg total) by mouth daily. Copland, Gay Filler, MD Taking Active   [facility] VERIO test strip [id number] Yes USE AS INSTRUCTED Copland, Gay Filler, MD Taking Active   OVER THE COUNTER MEDICATION [id number] Yes Stinging nettle daily for prostate [provider] Taking Active   OVER THE COUNTER MEDICATION [id number] Yes Take 1 tablet by mouth daily. Kenard Gower [provider] Taking Active   Semaglutide, 2 MG/DOSE, 8 MG/3ML SOPN [id number] No Inject 2 mg as directed once a week.  Patient not taking: Reported on 05/09/2022   Copland, Gay Filler, MD Not Taking Active   tadalafil (CIALIS) 10 MG tablet [id number] Yes Take 1 tablet (10 mg total) by mouth daily as needed for erectile dysfunction. Max 20 mg total per day Copland, Gay Filler, MD Taking Active   tadalafil (CIALIS) 5 MG tablet [id number] Yes Take 1 tablet (5 mg total) by mouth daily. Copland, Gay Filler, MD Taking Active   vitamin C (ASCORBIC ACID) 500 MG tablet [id number] Yes Take 500 mg by mouth daily. [provider] Taking Active Self  VITAMIN D-VITAMIN K PO [id number] Yes Take 1 tablet by mouth daily. [provider] Taking Active   zinc gluconate 50 MG tablet [id number] Yes Take 50 mg by mouth daily. 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   Paroxysmal atrial fibrillation ([place]) 12/16/2017   SBO (small bowel obstruction) ([place]) 10/08/2017   ATN (acute tubular necrosis) ([place]) 10/08/2017   Septic shock ([place]) 10/04/2017   Acute cholecystitis s/p perc cholecystomy drainage 10/04/2017    Acute respiratory failure with hypoxia (H[facility])    Chronic back pain 02/08/2016   Controlled type 2 diabetes mellitus with diabetic neuropathy, without long-term current use of insulin ([place]) 02/08/2016   Insomnia 02/08/2016   History of diverticulitis 02/08/2016   HYPERTENSION, BENIGN ESSENTIAL, UNCONTROLLED 03/12/2009   ABNORMAL ELECTROCARDIOGRAM 03/12/2009    Immunization History  Administered Date(s) Administered   Fluad Quad(high Dose 65+) 05/28/2019, 07/06/2020   Influenza, High Dose Seasonal PF 06/20/2016, 07/08/2017   Influenza-Unspecified 08/12/2021   PFIZER Comirnaty(Gray Top)Covid-19 Tri-Sucrose Vaccine 11/15/2019, 12/15/2019, 09/02/2020   PFIZER(Purple Top)SARS-COV-2 Vaccination 11/15/2019, 12/15/2019   Pfizer Covid-19 Vaccine Bivalent Booster 50yr & up 08/16/2021   Pneumococcal Conjugate-13 06/20/2016   Pneumococcal Polysaccharide-23 02/06/2018, 05/28/2019   Pneumococcal-Unspecified 12/20/2011, 05/18/2012   Td 11/15/2021   Tdap 09/18/2011, 10/31/2011   Zoster Recombinat (Shingrix) 02/19/2020, 05/03/2020   Conditions to be addressed/monitored: HTN, HLD, DMII, GERD, BPH, and arthritis pain; ED / low testosterone, elevated PSA  There are no care plans that you recently modified to display  for this patient.     Medication Assistance: Applied for [name] assistance with Praluent and any medication for hypercholesterolemia. Patient was approved for $2500 from 08/28/2021 thru 08/27/2022.  Provided number for [name] diabetes assistance fund and application for [employer] medication assistance program.   Patient's preferred pharmacy is:  [facility], [place] - [address] [place] [address] Phone: [phone] Fax: [phone]  [facility] [place] [facility] [place] [address] Phone: [phone] Fax: [place] [phone] - [place], [place] [address] [place] [address] Phone: [phone] Fax: [phone]  [id number] [place] [place] - [address] [id number]: [phone]: [phone]  Follow Up:  Patient agrees to Care Plan and Follow-up.  Plan: Telephone follow up appointment with care management team member scheduled for:  2 to 4 weeks.   TCherre Robins PharmD Clinical Pharmacist [facility]

## 2022-05-09 NOTE — Patient Instructions (Signed)
Mr. Stclair It was a pleasure speaking with you today.  Below is a summary of your health goals and summary of our recent visit. You can also view your updated Chronic Care Management Care plan through your MyChart account.   Start felodipine 2.'5mg'$  daily for blood pressure Continue to take benazepril '40mg'$  twice a day for blood pressure.   Goal blood pressure is < 140/90 but if we can get < 130/80 even better.  Continue to check blood pressure daily and record.   [place] number for diabetes fund [phone] I have provided you a sample of Ozempic '2mg'$  for 4 weeks. Also included application for medication assistance program for Ozempic but not sure that you will qualify since you have [facility] benefits.  Another option since [place] is not able to supply Ozempic at this time is to ask them if you can switch to Trulicty - I would recommended you start with '3mg'$  weekly and increase as needed and tolerate to 4.'5mg'$  weekly  As always if you have any questions or concerns especially regarding medications, please feel free to contact me either at the phone number below or with a MyChart message.   Keep up the good work!  Cherre Robins, PharmD Clinical Pharmacist [place] [phone] (direct line)  [phone] (main office number)   Patient verbalizes understanding of instructions and care plan provided today and agrees to view in [place]. Active MyChart status and patient understanding of how to access instructions and care plan via MyChart confirmed with patient.

## 2022-05-13 ENCOUNTER — Encounter: Payer: Self-pay | Admitting: Pharmacist

## 2022-05-14 ENCOUNTER — Other Ambulatory Visit (HOSPITAL_BASED_OUTPATIENT_CLINIC_OR_DEPARTMENT_OTHER): Payer: Medicare HMO

## 2022-05-17 DIAGNOSIS — E785 Hyperlipidemia, unspecified: Secondary | ICD-10-CM

## 2022-05-17 DIAGNOSIS — I10 Essential (primary) hypertension: Secondary | ICD-10-CM

## 2022-05-17 DIAGNOSIS — E1159 Type 2 diabetes mellitus with other circulatory complications: Secondary | ICD-10-CM

## 2022-05-29 ENCOUNTER — Ambulatory Visit (INDEPENDENT_AMBULATORY_CARE_PROVIDER_SITE_OTHER): Payer: Medicare HMO | Admitting: Pharmacist

## 2022-05-29 DIAGNOSIS — E1165 Type 2 diabetes mellitus with hyperglycemia: Secondary | ICD-10-CM

## 2022-05-29 DIAGNOSIS — I10 Essential (primary) hypertension: Secondary | ICD-10-CM

## 2022-05-29 DIAGNOSIS — E785 Hyperlipidemia, unspecified: Secondary | ICD-10-CM

## 2022-05-29 DIAGNOSIS — R931 Abnormal findings on diagnostic imaging of heart and coronary circulation: Secondary | ICD-10-CM

## 2022-05-29 DIAGNOSIS — K219 Gastro-esophageal reflux disease without esophagitis: Secondary | ICD-10-CM

## 2022-05-29 DIAGNOSIS — I2510 Atherosclerotic heart disease of native coronary artery without angina pectoris: Secondary | ICD-10-CM

## 2022-05-29 MED ORDER — VALSARTAN 160 MG PO TABS: 160.0000 mg | ORAL_TABLET | Freq: Every day | ORAL | 0 refills | Status: DC

## 2022-05-29 MED ORDER — FELODIPINE ER 2.5 MG PO TB24: 2.5000 mg | ORAL_TABLET | Freq: Every day | ORAL | 0 refills | Status: DC

## 2022-06-01 NOTE — Patient Instructions (Signed)
Mr. Hoaglin It was a pleasure speaking with you today.  Below is a summary of your health goals and summary of our recent visit. You can also view your updated Chronic Care Management Care plan through your MyChart account.   Plan for tapering off omeprazole and famotidine: Week 1: Take '40mg'$  every OTHER day  Week 2: Take omeprazole '20mg'$  (can get over the counter) every OTHER day for 1 week, then stop.  Week 3: If no change in acid reflux, then try lowering dose of famotidine to '10mg'$  daily for 1 week and then stop.   As always if you have any questions or concerns especially regarding medications, please feel free to contact me either at the phone number below or with a MyChart message.   Keep up the good work!  Cherre Robins, PharmD Clinical Pharmacist [place] [phone] (direct line)  [phone] (main office number)   Patient verbalizes understanding of instructions and care plan provided today and agrees to view in [place]. Active MyChart status and patient understanding of how to access instructions and care plan via MyChart confirmed with patient.

## 2022-06-01 NOTE — Chronic Care Management (AMB) (Signed)
Chronic Care Management Pharmacy Note  05/29/2022 Name:  Timothy Chapman MRN:  [id number] DOB:  [birth year]  Summary: Type 2 DM: A1c has improved from 9.1% to 8.5% but not at goal. Patient has restarted Ozempic 37m daily. He still has not been able to get Ozempic 26mfrom [place] getting [place] Part [place] benefits but is now in coverage gap. Provided number for HeEstée Lauderiabetes fund - [phone] last visit but patient did not call and diabetes fund is now closed to new applications. He is working on getting medication assistance program application for [facility]Card[facility] Healthompleted though I am not sure if he will qualify.  Hypertension: blood pressure goal < 130/90. At last visit started felodipine 2.41m60maily. He is also taking benazepril 29m14mice a day. Blood pressure per patient have been variable. He has noted a little puffiness in his ankles but not much. He is concerned that blood pressure medications can affect his libido / ED. Discussed medications that can affect ED. Patient does have low T which I think is likely reason for ED. He will see urology in November for evaluation. Since blood pressure has been erratic will try longer acting ACE/ARB to see if better blood pressure control. Changed benazepril to valsartan 160mg43mly Continue felodipine 2.41mg d1my.  Hyperlipidemia: Last LDL at goal but triglycerides still elevated. Recent cornary calcium score was elevated. Pateint referred to cardiologist. He has not been able to tolerate statin in past. Taking Praluent every 14 days. [facility] other medications that could affect ED. Patient would like try to stop omeprazole and famotidine if possible as they have been reported to affect ED. Patient to taper off omeprazole - take 29mg e6m other day for 1 week, then take 20mg ev66mther day for 1 week, then stop.  If no change in reflux then he will try lowering dose of  famotidine to 10mg dai441mor 1 week and then stop.    Subjective: Timothy Chapman y.o. y76r old male who is a primary patient of Copland, Jessica CGay Fillere CCM team was consulted for assistance with disease management and care coordination needs.    Engaged with patient by telephone for follow up visit in response to provider referral for pharmacy case management and/or care coordination services.   Consent to Services:  The patient was given information about Chronic Care Management services, agreed to services, and gave verbal consent prior to initiation of services.  Please see initial visit note for detailed documentation.   Patient Care Team: Copland, Jessica CGay FillerCP - General (Family Medicine) Marvelous Bouwens, TCherre Robins (Pharmacist)  Recent office visits: 03/26/2022 - Fam Med (Dr Copland) Lorelei Pontonic conditions. Labs checked. A1c improved but still not at goal. Added glipizide 10mg - st21mwith 0.5 tablet = 41mg daily.30mferred to unrology with [facility] for second opinion regarding prostate and low testosterone.  11/15/2021 - Fam Med (Dr Copland) SeLorelei Pontfollow up. Labs checked. Will be traveling to [place] for about 2 months soon. Provided Rx for doxycycline and Typhoid Vaccine. F/U 3 to 4 months.  Recent consult visits: 04/03/2022 - [facility] Salman - FaAlferd PateetHeriberto Antigualow up DM and HTN. 07/13/2021 - MR of prostate at [facility] evidence of a clinically significant focal prostate cancer. Attention on followup as indicated.  06/28/2021 - Neurology (Dr Chima) evalBilley Goslingn of neuropathy  and falls.He has tried multiple treatments for neuropathy previously and is not interested in trying any more medications. Checked labs and recommended continue physical therapy.  06/27/2021 - Ophthalmology at [facility]. 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 Received Eylea injection in left eye 06/21/2021 - Urology ([facility])  f/u prostate cancer and his elevated serum PSA value. I will repeat PSA today. Ordered repeat MRI. Also discussed repeat prostate biopsy given his history of prostate cancer.   Hospital visits: None in previous 6 months  Objective:  Lab Results  Component Value Date   CREATININE 1.07 03/26/2022   CREATININE 1.23 11/15/2021   CREATININE 1.43 08/16/2021    Lab Results  Component Value Date   HGBA1C 8.5 (H) 03/26/2022   Last diabetic Eye exam:  Lab Results  Component Value Date/Time   HMDIABEYEEXA Retinopathy (A) 06/27/2021 12:00 AM    Last diabetic Foot exam: No results found for: "HMDIABFOOTEX"      Component Value Date/Time   CHOL 175 08/16/2021 1058   TRIG (H) 08/16/2021 1058    475.0 Triglyceride is over 400; calculations on Lipids are invalid.   HDL 33.30 (L) 08/16/2021 1058   CHOLHDL 5 08/16/2021 1058   VLDL 57.0 (H) 07/09/2017 0826   LDLCALC  07/06/2020 1448     Comment:     . LDL cholesterol not calculated. Triglyceride levels greater than 400 mg/dL invalidate calculated LDL results. . Reference range: <100 . Desirable range <100 mg/dL for primary prevention;   <70 mg/dL for patients with CHD or diabetic patients  with > or = 2 CHD risk factors. Marland Kitchen LDL-C is now calculated using the Martin-Hopkins  calculation, which is a validated novel method providing  better accuracy than the Friedewald equation in the  estimation of LDL-C.  Cresenciano Genre et al. Annamaria Helling. 7408;144(81): 2061-2068  (http://education.QuestDiagnostics.com/faq/FAQ164)    LDLDIRECT 106.0 08/16/2021 1058       Latest Ref Rng & Units 03/26/2022    9:31 AM 08/16/2021   10:58 AM 06/28/2021   11:16 AM  Hepatic Function  Total Protein 6.0 - 8.3 g/dL 7.3  7.3  7.9   Albumin 3.5 - 5.2 g/dL 4.5  4.3    AST 0 - 37 U/L 32  26    ALT 0 - 53 U/L 48  32    Alk Phosphatase 39 - 117 U/L 37  36    Total Bilirubin 0.2 - 1.2 mg/dL 0.3  0.4      Lab Results  Component Value Date/Time   TSH 1.63 03/26/2022  09:31 AM       Latest Ref Rng & Units 03/26/2022    9:31 AM 11/15/2021   10:56 AM 04/26/2021   12:01 PM  CBC  WBC 4.0 - 10.5 K/uL 6.3  7.3  6.9   Hemoglobin 13.0 - 17.0 g/dL 12.4  12.3  12.1   Hematocrit 39.0 - 52.0 % 37.8  38.2  36.5   Platelets 150.0 - 400.0 K/uL 251.0  293.0  232.0     Lab Results  Component Value Date/Time   VD25OH 72.83 03/26/2022 09:31 AM    Clinical ASCVD: Yes  The 10-year ASCVD risk score (Arnett DK, et al., 2019) is: 49.8%*   Values used to calculate the score:     Age: 77 years     Sex: Male     Is Non-Hispanic African American: No     Diabetic: Yes     Tobacco smoker: No  Systolic Blood Pressure: 528 mmHg     Is BP treated: Yes     HDL Cholesterol: 43 mg/dL*     Total Cholesterol: 171 mg/dL*     * - Cholesterol units were assumed for this score calculation     Social History   Tobacco Use  Smoking Status Former   Types: Cigarettes   Quit date: 09/18/1975   Years since quitting: 46.7  Smokeless Tobacco Never   BP Readings from Last 3 Encounters:  03/26/22 (!) 144/80  11/15/21 (!) 142/88  08/16/21 130/84   Pulse Readings from Last 3 Encounters:  03/26/22 91  11/15/21 97  08/16/21 68   Wt Readings from Last 3 Encounters:  03/26/22 238 lb 6.4 oz (108.1 kg)  11/15/21 240 lb (108.9 kg)  08/16/21 237 lb 12.8 oz (107.9 kg)    Assessment: Review of patient past medical history, allergies, medications, health status, including review of consultants reports, laboratory and other test data, was performed as part of comprehensive evaluation and provision of chronic care management services.   SDOH:  (Social Determinants of Health) assessments and interventions performed:  SDOH Interventions    Flowsheet Row Chronic Care Management from 09/27/2021 in [facility] at [place] Management from 06/20/2021 in [place] at [place] Management from 04/04/2021 in  [facility] at [place] from 02/22/2021 in [place] at [place] Management from 01/02/2021 in [facility] at [place] Interventions       Financial Strain Interventions Other (Comment)  [applied for [name] for Praluent] Other (Comment)  [Mailed patient application for Praluent last month. He acknowledges that he has received but has not completed.] -- -- Intervention Not Indicated  Physical Activity Interventions -- -- Other (Comments)  [started using treadmill recently] Intervention Not Indicated  [pt plans to increase activity] --        CCM Care Plan  Allergies  Allergen Reactions   Codeine     hallucinations   Farxiga [Dapagliflozin] Other (See Comments)    Genital infection and irritation   Statins     Has tried several cannot tolerate    Medications Reviewed Today     Reviewed by Cherre Robins, RPH-CPP (Pharmacist) on 05/29/22 at 64  Med List Status: <None>   Medication Order Taking? Sig Documenting Provider Last Dose Status Informant  Acetylcarnitine HCl 500 MG CAPS [id number] Yes Take 500 mg by mouth daily. [provider] Taking Active Self           Med Note Rosemarie Beath, MELISSA B   Thu Nov 21, 2017 12:03 PM)    Alcohol Swabs (B-D SINGLE USE SWABS REGULAR) PADS [id number] Yes USE PRIOR TO INSULIN DOSE Copland, Gay Filler, MD Taking Active   Alirocumab (PRALUENT) 75 MG/ML Darden Palmer [id number] Yes INJECT 75MG INTO THE SKIN EVERY 14 DAYS Copland, Gay Filler, MD Taking Active   Alpha-Lipoic Acid 600 MG CAPS [id number] Yes Take 600 mg by mouth daily. [provider] Taking Active Self           Med Note Antony Contras, Malaysha Arlen B   Tue Apr 04, 2021  1:16 PM)    aspirin 81 MG chewable tablet [id number] Yes Chew 1 tablet (81 mg total) by mouth daily. Shelly Coss, MD Taking Active Self  benazepril (LOTENSIN) 40 MG tablet [id number] Yes Take 1  tablet (40 mg total)  by mouth in the morning and at bedtime.  Patient taking differently: Take 40 mg by mouth 2 (two) times daily.   Copland, Gay Filler, MD Taking Active   diclofenac (VOLTAREN) 75 MG EC tablet [id number] Yes Take one BID prn joint pain Copland, Gay Filler, MD Taking Active   famotidine (PEPCID) 20 MG tablet [id number] Yes Take 1 tablet (20 mg total) by mouth 2 (two) times daily. Copland, Gay Filler, MD Taking Active   felodipine (PLENDIL) 2.5 MG 24 hr tablet [id number] Yes Take 1 tablet (2.5 mg total) by mouth daily. Copland, Gay Filler, MD Taking Active   glipiZIDE (GLUCOTROL) 10 MG tablet [id number] Yes Take 1 tablet (10 mg total) by mouth daily before breakfast. Copland, Gay Filler, MD Taking Active   Lancets (ONETOUCH DELICA PLUS LPFXTK24O) [place] [id number] Yes USE TWO TIMES A DAY TO     CHECK BLOOD SUGAR Copland, Gay Filler, MD Taking Active   melatonin 5 MG TABS [id number] Yes Take 5 mg by mouth at bedtime.  [provider] Taking Active Self  metFORMIN (GLUCOPHAGE-XR) 500 MG 24 hr tablet [id number] Yes TAKE 2 TABLETS TWICE A DAY  WITH FOOD Copland, Gay Filler, MD Taking Active   Multiple Vitamin (MULTIVITAMIN) tablet [id number] Yes Take 1 tablet by mouth daily. [provider] Taking Active Self  niacin (NIASPAN) 1000 MG CR tablet [id number] Yes Take 1,000 mg by mouth at bedtime. [provider] Taking Active            Med Note Antony Contras, Adria Dill Nov 23, 2021  2:06 PM) Patient is taking over-the-counter niacin  omeprazole (PRILOSEC) 40 MG capsule [id number] Yes Take 1 capsule (40 mg total) by mouth daily. Copland, Gay Filler, MD Taking Active   [facility] VERIO test strip [id number] Yes USE AS INSTRUCTED Copland, Gay Filler, MD Taking Active   OVER THE COUNTER MEDICATION [id number] Yes Stinging nettle daily for prostate [provider] Taking Active   OVER THE COUNTER MEDICATION [id number] Yes Take 1 tablet by mouth daily. Kenard Gower [provider]  Taking Active   Semaglutide, 2 MG/DOSE, 8 MG/3ML SOPN [id number] Yes Inject 2 mg as directed once a week. Copland, Gay Filler, MD Taking Active   tadalafil (CIALIS) 10 MG tablet [id number] Yes Take 1 tablet (10 mg total) by mouth daily as needed for erectile dysfunction. Max 20 mg total per day Copland, Gay Filler, MD Taking Active   tadalafil (CIALIS) 5 MG tablet [id number] Yes Take 1 tablet (5 mg total) by mouth daily. Copland, Gay Filler, MD Taking Active   vitamin C (ASCORBIC ACID) 500 MG tablet [id number] Yes Take 500 mg by mouth daily. [provider] Taking Active Self  VITAMIN D-VITAMIN K PO [id number] Yes Take 1 tablet by mouth daily. [provider] Taking Active   zinc gluconate 50 MG tablet [id number] Yes Take 50 mg by mouth daily. 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   Paroxysmal atrial fibrillation ([place]) 12/16/2017   SBO (small bowel obstruction) ([place]) 10/08/2017   ATN (acute tubular necrosis) ([place]) 10/08/2017   Septic shock ([place]) 10/04/2017   Acute cholecystitis s/p perc cholecystomy drainage 10/04/2017    Acute respiratory failure with hypoxia ([place])    Chronic back pain 02/08/2016   Controlled type 2 diabetes mellitus with diabetic neuropathy, without long-term current use of insulin ([place]) 02/08/2016  Insomnia 02/08/2016   History of diverticulitis 02/08/2016   HYPERTENSION, BENIGN ESSENTIAL, UNCONTROLLED 03/12/2009   ABNORMAL ELECTROCARDIOGRAM 03/12/2009    Immunization History  Administered Date(s) Administered   Fluad Quad(high Dose 65+) 05/28/2019, 07/06/2020   Influenza, High Dose Seasonal PF 06/20/2016, 07/08/2017   Influenza-Unspecified 08/12/2021   PFIZER Comirnaty(Gray Top)Covid-19 Tri-Sucrose Vaccine 11/15/2019, 12/15/2019, 09/02/2020   PFIZER(Purple Top)SARS-COV-2 Vaccination 11/15/2019, 12/15/2019   Pfizer Covid-19 Vaccine Bivalent Booster  41yr & up 08/16/2021   Pneumococcal Conjugate-13 06/20/2016   Pneumococcal Polysaccharide-23 02/06/2018, 05/28/2019   Pneumococcal-Unspecified 12/20/2011, 05/18/2012   Td 11/15/2021   Tdap 09/18/2011, 10/31/2011   Zoster Recombinat (Shingrix) 02/19/2020, 05/03/2020   Conditions to be addressed/monitored: HTN, HLD, DMII, GERD, BPH, and arthritis pain; ED / low testosterone, elevated PSA  Care Plan : General Pharmacy (Adult)  Updates made by ECherre Robins RPH-CPP since 06/01/2022 12:00 AM     Problem: Chronic Disease Management support, education, and care coordination needs related to HTN, Hyperlipidemia, Diabetes, Insomnia, GERD, Pain, BPH/ED   Note:   Current Barriers:  Unable to maintain control of type 2 DM or HTN Does not notify p32office if has questions or concerns - improving In need of Chronic Disease Management support, education, and care coordination related to Hypertension, Hyperlipidemia, Diabetes, GERD (with Barrett's Esophagus); NASH; Pain, BPH/ED History of low adherence to maintenance medications -improving Unable to afford therapy for hyperlipidemia (Praluent) or type 2 DM (Ozempic)  Multiple comorbidities Complex medication regimen Independent pausing, stopping or controlling of the medication. Fear of medication side effects  Pharmacist Clinical Goal(s):  Over the next 180 days, patient will verbalize ability to afford treatment regimen achieve adherence to monitoring guidelines and medication adherence to achieve therapeutic efficacy achieve control of diabetes and hypertension as evidenced by A1c < 7.0% and BP <140/90 adhere to prescribed medication regimen as evidenced by refill history and meeting of goal mentioned below  through collaboration with PharmD and provider.  Better understand medication regimen and improve adherence to medication therapy Maintain communication with PCP's office   Interventions: 1:1 collaboration with Copland, JGay Filler MD regarding development and update of comprehensive plan of care as evidenced by provider attestation and co-signature Inter-disciplinary care team collaboration (see longitudinal plan of care) Comprehensive medication review performed; medication list updated in electronic medical record   Diabetes: Uncontrolled - A1c goal <7.0% A1c is not at goal but improved from 10.6% 02/10/2021 to 9.7% 04/26/2021 and 8.7% 07/2021 Current regimen:  Ozempic - inject 260mweekly (dose increased 11/15/2021) Metformin XR 50051m take 2 tablets = 1000m38mice a day with morning and evening meals Was unable to tolerate FarxIranaused irritation and infection of genitals; Rybelsus - cost and more nasuea than Ozempic Patient repors he has been unable to get Ozempic 2mg 64mm [facility] - on back order. He has used his Medicare Part D and is now in coverage gap. Cost of Ozempic has been too high. Has missed last 3 doses.  [facility] was high normal at 4.06 Patient reports recent home BG: ranging from 125 to 225 recently Denies BG <80 / hypoglycemic symptoms [facility] Valorie Rooseveltings from Last 3 Encounters:  03/26/22 238 lb 6.4 oz (108.1 kg)  11/15/21 240 lb (108.9 kg)  08/16/21 237 lb 12.8 oz (107.9 kg)  Exercise: using treadmill a few times per week Interventions: Discussed A1c and blood glucose goals Reviewed home blood glucose readings and reviewed goals.  Recommended he restart checking blood glucose daily (patient is not candidate for Continuous Glucose Monitor at this time)  Fasting blood glucose goal (before meals) = 80 to 130 Blood glucose goal after a meal = less than 180  Provided 1 month sample of Ozempic and application for patient assistance program though not sure he will qualify since he has VA benefits. Also provied phone number for [place] [phone] (unable to apply for this fund on line) We might be  able to switch to Trulicity - noted to be tier 3 on [place] formulary. Patient to discuss with his [place]. If he switched to Trulicity I would recommend he start with Trullicity 46m weekly for 1 month, if tolerated but needs more blood glucose lowering can increase to Trulicity 44.2PNweekly.  Another option If A1c still > 8.0 at next visit, consider long acting insulin, Jardiance or switch Ozempic to [employer]  VRegions Financial Corporationwill require prior authorization for [employer] Criteria - All of the following must be met (1) Diagnosis of Type 2 diabetes (2) Inadequate glycemic control on at least 154mof semaglutide injection plus two or more glucose lowering drugs (metformin, empagliflozin, insulin, pioglitazone, sulfonylurea) for at least 6 months (3) Change needed to achieve goal A1C is less than 1% Restart low carbohydrate diet and exercise at least 150 minutes per week.   Hypertension: BP Readings from Last 3 Encounters:  03/26/22 (!) 144/80  11/15/21 (!) 142/88  08/16/21 130/84  Blood pressure slightly above at last office visit. Home blood pressure has been lower.  Goal BP <140/90 Current regimen:  Benazepril 4025mwice a day  Felodipine 2.5mg69mily  Home blood pressure: 160-180 / 80's  Previous medications tried: amlodipine - stopped due to dizziness; losartan-hydrochlorothiazide - patient concerned for hydrochlorothiazide side effects and did not feel that he needed it because his "ankles are skinny and shriveled up" Patient reports he was falling a lot when he was taking amlodipine; Dr WeslJoyce Grossh [place] amlodipine in May 2022 and patient reports he is no longer falling.  He saw neurology for dizziness / neuropathy 06/2021. No cause of dizziness noted. No further follow up recommended unless patient would like to pursue treatment of neuropathy.  Interventions: Recommended patient check blood pressure 1 to 3 times per week, document, and provide at future  appointments  Discussed blood pressure goal Discussed diet and exercise Ensure daily salt intake < 2300 mg/day Stop benazepril; Start Valsartan 160mg38mly  Hyperlipidemia with elevated triglycerides LDL nor triglycerides at goal;  Triglycerides have improved from 815 to 475. HDLC also improaved form 25 to 33.  LDL goal < 100 and Triglycerides <150 Current regimen:  Niaspan CR 1000mg 79medtime - take aspirin 81mg p84m to niaspan to decrease flushing (prescription niacin too expensive patient taking over-the-counter)  Salmon Oil 1000mg tw59mdaily per [facility]  Praluent 75mg eve72m4 days  Patient has VA benefits - contacted VA to see if Praluent is on formulary. Unfortunately needs prior approval from VA PCP. [place] [facility] but did not have time to address.  Approved for hyper[facility]olesterolemia funds [facility] - $2500 available for year. Patient reports [facility] did not use Health[facility] funds last month when filled and cost was $47.  Patient denies alcohol use Interventions: Discussed lipid goals and risk associated with elevated trilycerides Reviewed foods that can increase triglycerides; encouraged patient to continue to limit intake  of alcohol, sweets / sugar, potatoes, bread, rice and pasta (addressed at previous visit) Encouraged increased physical activity  Chronic Back Pain / Osteoarthritis Controlled Current regimen:  Alpha lipoic acid 686m daily  Diclofenac 750mup to twice a day if needed for pain Patient self care activities - Over the next 90 days, patient will: Maintain medication regimen for arthritis    GERD Controlled Current regimen:  Famotidine 2055mwice daily Omeprazole 31m50mily at bedtime Patient self care activities - Over the next 90 days, patient will: Plan for tapering off omeprazole and famotidine: Week 1: Take 31mg101mry OTHER day  Week 2: Take omeprazole 20mg 80m get over the  counter) every OTHER day for 1 week, then stop.  Week 3: If no change in acid reflux, then try lowering dose of famotidine to 10mg d9m for 1 week and then stop.   Insomnia:  Current Regimen:  Melatonin 5mg at 77mtime Patient reports he is sleeping fairly well at night.  He does have a 1 year o58d at home who is not sleeping through the night most nights.  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  Interventions: none Continue current regimen for sleep  BPH with elevated PSA / Low testosterone; Patient has seen urologist regarding elevated PSA; Dr Davis haRosana Hoesommended TURP but patient declined. Patient would like second opinion regarding treatment for enlarged prostate.  Had MRI of prostate 07/13/2021 - radiology ([facility] [place], [place] no evidence of a clinically significant focal prostate cancer. Attention on followup as indicated.  Patient reports he feels that his testosterone is low. Last checked in 2018 Interventions:  Discussed risk versus benefits of testosterone replacement in light of elevated prostate.  Patient to discuss further with PCP at appointment 03/26/2022  Medication management [facility] Has some [facility] benefits Interventions Comprehensive medication review performed. Medication list updated Reviewed refill history and discussed adherence with patient. Continue current medication management strategy Discussed risk versus benefits of [employer].   Patient Goals/Self-Care Activities Over the next 180 days, patient will:  take medications as prescribed Change benazepril to valsartan 160mg dai29mor blood pressure  Continue felodipine 2.5mg daily90mr blood pressure  Restart checking glucose daily , document, and provide at future appointments,  check blood pressure 1 to 3 times per week, document, and provide at future appointments, and  collaborate with provider on medication access solutions Complete  application for medication assistance program for Ozempic and return to office. If declined can apply for diabetes fund  [facility] about possibly changing to Trulicity if you are not able to get Ozempic.  Plan for tapering off omeprazole and famotidine: Week 1: Take 31mg every17mER day  Week 2: Take omeprazole 20mg (can g42mver the counter) every OTHER day for 1 week, then stop.  Week 3: If no change in acid reflux, then try lowering dose of famotidine to 10mg daily f31m week and then stop.   Follow Up Plan: Telephone follow up appointment with care management team member scheduled for:  2 to 4 weeks               Medication Assistance: Applied for Health[employer] FoEstée Lauderith Praluent and any medication for hypercholesterolemia. Patient was approved for $2500 from 08/28/2021 thru 08/27/2022.  Provided number for Healthwell FoEstée Lauderistance fund and application for Novo [employer].   Patient's preferred pharmacy is:  [facility]  [place], [place] - [address] [address] Phone: [phone] Fax: [phone]  [facility] [place] [facility] [address] [place] [address] Phone: [phone] Fax: [phone]  [id number] [place],  - [id number] [id number]: [phone]: [phone]  Follow Up:  Patient agrees to Care Plan and Follow-up.  Plan: Telephone follow up appointment with care management team member scheduled for:  2 to 4 weeks.   TCherre Robins PharmD Clinical Pharmacist [facility]

## 2022-06-16 DIAGNOSIS — E1159 Type 2 diabetes mellitus with other circulatory complications: Secondary | ICD-10-CM | POA: Diagnosis not present

## 2022-06-16 DIAGNOSIS — M810 Age-related osteoporosis without current pathological fracture: Secondary | ICD-10-CM | POA: Diagnosis not present

## 2022-06-16 DIAGNOSIS — I10 Essential (primary) hypertension: Secondary | ICD-10-CM | POA: Diagnosis not present

## 2022-06-16 DIAGNOSIS — N400 Enlarged prostate without lower urinary tract symptoms: Secondary | ICD-10-CM | POA: Diagnosis not present

## 2022-06-16 DIAGNOSIS — E785 Hyperlipidemia, unspecified: Secondary | ICD-10-CM | POA: Diagnosis not present

## 2022-06-16 DIAGNOSIS — I2510 Atherosclerotic heart disease of native coronary artery without angina pectoris: Secondary | ICD-10-CM

## 2022-06-20 ENCOUNTER — Other Ambulatory Visit: Payer: Self-pay | Admitting: Family Medicine

## 2022-06-25 ENCOUNTER — Other Ambulatory Visit: Payer: Self-pay

## 2022-06-25 DIAGNOSIS — F419 Anxiety disorder, unspecified: Secondary | ICD-10-CM | POA: Insufficient documentation

## 2022-06-25 DIAGNOSIS — M609 Myositis, unspecified: Secondary | ICD-10-CM

## 2022-06-25 DIAGNOSIS — E1140 Type 2 diabetes mellitus with diabetic neuropathy, unspecified: Secondary | ICD-10-CM

## 2022-06-25 DIAGNOSIS — H34839 Tributary (branch) retinal vein occlusion, unspecified eye, with macular edema: Secondary | ICD-10-CM | POA: Insufficient documentation

## 2022-06-25 DIAGNOSIS — M25559 Pain in unspecified hip: Secondary | ICD-10-CM

## 2022-06-25 DIAGNOSIS — E119 Type 2 diabetes mellitus without complications: Secondary | ICD-10-CM

## 2022-06-25 DIAGNOSIS — E8881 Metabolic syndrome: Secondary | ICD-10-CM

## 2022-06-25 DIAGNOSIS — Z7985 Type 2 diabetes mellitus without complications: Secondary | ICD-10-CM | POA: Insufficient documentation

## 2022-06-25 DIAGNOSIS — G709 Myoneural disorder, unspecified: Secondary | ICD-10-CM | POA: Insufficient documentation

## 2022-06-25 DIAGNOSIS — H34832 Tributary (branch) retinal vein occlusion, left eye, with macular edema: Secondary | ICD-10-CM

## 2022-06-25 DIAGNOSIS — Z860101 Personal history of adenomatous and serrated colon polyps: Secondary | ICD-10-CM

## 2022-06-25 DIAGNOSIS — K2270 Barrett's esophagus without dysplasia: Secondary | ICD-10-CM | POA: Insufficient documentation

## 2022-06-25 DIAGNOSIS — Z9889 Other specified postprocedural states: Secondary | ICD-10-CM | POA: Insufficient documentation

## 2022-06-25 DIAGNOSIS — Z86010 Personal history of colonic polyps: Secondary | ICD-10-CM

## 2022-06-25 DIAGNOSIS — G4730 Sleep apnea, unspecified: Secondary | ICD-10-CM | POA: Insufficient documentation

## 2022-06-25 DIAGNOSIS — M1990 Unspecified osteoarthritis, unspecified site: Secondary | ICD-10-CM | POA: Insufficient documentation

## 2022-06-25 DIAGNOSIS — F32A Depression, unspecified: Secondary | ICD-10-CM | POA: Insufficient documentation

## 2022-06-25 HISTORY — DX: Personal history of adenomatous and serrated colon polyps: Z86.0101

## 2022-06-25 HISTORY — DX: Type 2 diabetes mellitus without complications: E11.9

## 2022-06-25 HISTORY — DX: Tributary (branch) retinal vein occlusion, unspecified eye, with macular edema: H34.8390

## 2022-06-25 HISTORY — DX: Myositis, unspecified: M60.9

## 2022-06-25 HISTORY — DX: Type 2 diabetes mellitus with diabetic neuropathy, unspecified: E11.40

## 2022-06-25 HISTORY — DX: Metabolic syndrome: E88.810

## 2022-06-25 HISTORY — DX: Tributary (branch) retinal vein occlusion, left eye, with macular edema: H34.8320

## 2022-06-25 HISTORY — DX: Pain in unspecified hip: M25.559

## 2022-06-26 ENCOUNTER — Encounter: Payer: Self-pay | Admitting: Cardiology

## 2022-06-26 ENCOUNTER — Ambulatory Visit: Payer: Medicare HMO | Attending: Cardiology | Admitting: Cardiology

## 2022-06-26 VITALS — BP 162/92 | HR 96 | Ht 69.0 in | Wt 247.0 lb

## 2022-06-26 DIAGNOSIS — E669 Obesity, unspecified: Secondary | ICD-10-CM

## 2022-06-26 DIAGNOSIS — R0609 Other forms of dyspnea: Secondary | ICD-10-CM

## 2022-06-26 DIAGNOSIS — R011 Cardiac murmur, unspecified: Secondary | ICD-10-CM

## 2022-06-26 DIAGNOSIS — E782 Mixed hyperlipidemia: Secondary | ICD-10-CM

## 2022-06-26 DIAGNOSIS — E0840 Diabetes mellitus due to underlying condition with diabetic neuropathy, unspecified: Secondary | ICD-10-CM | POA: Diagnosis not present

## 2022-06-26 DIAGNOSIS — I480 Paroxysmal atrial fibrillation: Secondary | ICD-10-CM | POA: Diagnosis not present

## 2022-06-26 DIAGNOSIS — I10 Essential (primary) hypertension: Secondary | ICD-10-CM

## 2022-06-26 HISTORY — DX: Cardiac murmur, unspecified: R01.1

## 2022-06-26 HISTORY — DX: Other forms of dyspnea: R06.09

## 2022-06-26 HISTORY — DX: Essential (primary) hypertension: I10

## 2022-06-26 HISTORY — DX: Mixed hyperlipidemia: E78.2

## 2022-06-26 HISTORY — DX: Obesity, unspecified: E66.9

## 2022-06-26 NOTE — Patient Instructions (Signed)
Medication Instructions:  Your physician recommends that you continue on your current medications as directed. Please refer to the Current Medication list given to you today.  *If you need a refill on your cardiac medications before your next appointment, please call your pharmacy*   Lab Work: Your physician recommends that you have labs done in the office in the next few days. You do need to fast.  Your test included  basic metabolic panel, direct LDL, liver function and lipids.  [address] in [place]. They also close daily for lunch for 12-1.   [place] [address] 2nd floor M-W 8-11:30 and 1-4:30 and Thursday and Friday 8-11:30.  If you have labs (blood work) drawn today and your tests are completely normal, you will receive your results only by: [place] (if you have MyChart) OR A paper copy in the mail If you have any lab test that is abnormal or we need to change your treatment, we will call you to review the results.   Testing/Procedures: Your physician has requested that you have a lexiscan myoview. For further information please visit [email or web address]. Please follow instruction sheet, as given.  The test will take approximately 3 to 4 hours to complete; you may bring reading material.  If someone comes with you to your appointment, they will need to remain in the main lobby due to limited space in the testing area.   How to prepare for your Myocardial Perfusion Test: Do not eat or drink 3 hours prior to your test, except you may have water. Do not consume products containing caffeine (regular or decaffeinated) 12 hours prior to your test. (ex: coffee, chocolate, sodas, tea). Do bring a list of your current medications with you.  If not listed below, you may take your medications as normal. Do wear comfortable clothes (no dresses or overalls) and walking shoes, tennis shoes preferred (No heels or open toe shoes are allowed). Do NOT  wear cologne, perfume, aftershave, or lotions (deodorant is allowed). If these instructions are not followed, your test will have to be rescheduled.  Your physician has requested that you have an echocardiogram. Echocardiography is a painless test that uses sound waves to create images of your heart. It provides your doctor with information about the size and shape of your heart and how well your heart's chambers and valves are working. This procedure takes approximately one hour. There are no restrictions for this procedure.   Follow-Up: At [facility], you and your health needs are our priority.  As part of our continuing mission to provide you with exceptional heart care, we have created designated Provider Care Teams.  These Care Teams include your primary Cardiologist (physician) and Advanced Practice Providers (APPs -  Physician Assistants and Nurse Practitioners) who all work together to provide you with the care you need, when you need it.  We recommend signing up for the patient portal called "MyChart".  Sign up information is provided on this After Visit Summary.  MyChart is used to connect with patients for Virtual Visits (Telemedicine).  Patients are able to view lab/test results, encounter notes, upcoming appointments, etc.  Non-urgent messages can be sent to your provider as well.   To learn more about what you can do with MyChart, go to [email or web address].    Your next appointment:   6 month(s)  The format for your next appointment:   In Person  Provider:   Jyl Heinz, MD   Other Instructions Cardiac  Nuclear Scan A cardiac nuclear scan is a test that is done to check the flow of blood to your heart. It is done when you are resting and when you are exercising. The test looks for problems such as: Not enough blood reaching a portion of the heart. The heart muscle not working as it should. You may need this test if: You have heart disease. You have had lab results  that are not normal. You have had heart surgery or a balloon procedure to open up blocked arteries (angioplasty). You have chest pain. You have shortness of breath. In this test, a special dye (tracer) is put into your bloodstream. The tracer will travel to your heart. A camera will then take pictures of your heart to see how the tracer moves through your heart. This test is usually done at a hospital and takes 2-4 hours. Tell a doctor about: Any allergies you have. All medicines you are taking, including vitamins, herbs, eye drops, creams, and over-the-counter medicines. Any problems you or family members have had with anesthetic medicines. Any blood disorders you have. Any surgeries you have had. Any medical conditions you have. Whether you are pregnant or may be pregnant. What are the risks? Generally, this is a safe test. However, problems may occur, such as: Serious chest pain and heart attack. This is only a risk if the stress portion of the test is done. Rapid heartbeat. A feeling of warmth in your chest. This feeling usually does not last long. Allergic reaction to the tracer. What happens before the test? Ask your doctor about changing or stopping your normal medicines. This is important. Follow instructions from your doctor about what you cannot eat or drink. Remove your jewelry on the day of the test. What happens during the test? An IV tube will be inserted into one of your veins. Your doctor will give you a small amount of tracer through the IV tube. You will wait for 20-40 minutes while the tracer moves through your bloodstream. Your heart will be monitored with an electrocardiogram (ECG). You will lie down on an exam table. Pictures of your heart will be taken for about 15-20 minutes. You may also have a stress test. For this test, one of these things may be done: You will be asked to exercise on a treadmill or a stationary bike. You will be given medicines that will  make your heart work harder. This is done if you are unable to exercise. When blood flow to your heart has peaked, a tracer will again be given through the IV tube. After 20-40 minutes, you will get back on the exam table. More pictures will be taken of your heart. Depending on the tracer that is used, more pictures may need to be taken 3-4 hours later. Your IV tube will be removed when the test is over. The test may vary among doctors and hospitals. What happens after the test? Ask your doctor: Whether you can return to your normal schedule, including diet, activities, and medicines. Whether you should drink more fluids. This will help to remove the tracer from your body. Drink enough fluid to keep your pee (urine) pale yellow. Ask your doctor, or the department that is doing the test: When will my results be ready? How will I get my results? Summary A cardiac nuclear scan is a test that is done to check the flow of blood to your heart. Tell your doctor whether you are pregnant or may be pregnant. Before  the test, ask your doctor about changing or stopping your normal medicines. This is important. Ask your doctor whether you can return to your normal activities. You may be asked to drink more fluids. This information is not intended to replace advice given to you by your health care provider. Make sure you discuss any questions you have with your health care provider. Document Revised: 12/24/2018 Document Reviewed: 02/17/2018 Elsevier Patient Education  2021 [place].    Echocardiogram An echocardiogram is a test that uses sound waves (ultrasound) to produce images of the heart. Images from an echocardiogram can provide important information about: Heart size and shape. The size and thickness and movement of your heart's walls. Heart muscle function and strength. Heart valve function or if you have stenosis. Stenosis is when the heart valves are too narrow. If blood is flowing  backward through the heart valves (regurgitation). A tumor or infectious growth around the heart valves. Areas of heart muscle that are not working well because of poor blood flow or injury from a heart attack. Aneurysm detection. An aneurysm is a weak or damaged part of an artery wall. The wall bulges out from the normal force of blood pumping through the body. Tell a health care provider about: Any allergies you have. All medicines you are taking, including vitamins, herbs, eye drops, creams, and over-the-counter medicines. Any blood disorders you have. Any surgeries you have had. Any medical conditions you have. Whether you are pregnant or may be pregnant. What are the risks? Generally, this is a safe test. However, problems may occur, including an allergic reaction to dye (contrast) that may be used during the test. What happens before the test? No specific preparation is needed. You may eat and drink normally. What happens during the test? You will take off your clothes from the waist up and put on a hospital gown. Electrodes or electrocardiogram (ECG)patches may be placed on your chest. The electrodes or patches are then connected to a device that monitors your heart rate and rhythm. You will lie down on a table for an ultrasound exam. A gel will be applied to your chest to help sound waves pass through your skin. A handheld device, called a transducer, will be pressed against your chest and moved over your heart. The transducer produces sound waves that travel to your heart and bounce back (or "echo" back) to the transducer. These sound waves will be captured in real-time and changed into images of your heart that can be viewed on a video monitor. The images will be recorded on a computer and reviewed by your health care provider. You may be asked to change positions or hold your breath for a short time. This makes it easier to get different views or better views of your heart. In some  cases, you may receive contrast through an IV in one of your veins. This can improve the quality of the pictures from your heart. The procedure may vary among health care providers and hospitals.    What can I expect after the test? You may return to your normal, everyday life, including diet, activities, and medicines, unless your health care provider tells you not to do that. Follow these instructions at home: It is up to you to get the results of your test. Ask your health care provider, or the department that is doing the test, when your results will be ready. Keep all follow-up visits. This is important. Summary An echocardiogram is a test that uses  sound waves (ultrasound) to produce images of the heart. Images from an echocardiogram can provide important information about the size and shape of your heart, heart muscle function, heart valve function, and other possible heart problems. You do not need to do anything to prepare before this test. You may eat and drink normally. After the echocardiogram is completed, you may return to your normal, everyday life, unless your health care provider tells you not to do that. This information is not intended to replace advice given to you by your health care provider. Make sure you discuss any questions you have with your health care provider. Document Revised: 04/26/2020 Document Reviewed: 04/26/2020 Elsevier Patient Education  2021 [employer].

## 2022-06-26 NOTE — Progress Notes (Signed)
Cardiology Office Note:    Date:  06/26/2022   ID:  Timothy [place], DOB 08-04-1946, MRN [id number]  PCP:  [name], MD  Cardiologist:  Jenean Lindau, MD   Referring MD: [name], MD    ASSESSMENT:    1. Paroxysmal atrial fibrillation (HCC)   2. Diabetes mellitus due to underlying condition with diabetic neuropathy, without long-term current use of insulin ([place])   3. Essential hypertension   4. Mixed dyslipidemia   5. Obesity (BMI 35.0-39.9 without comorbidity)   6. Dyspnea on exertion   7. Cardiac murmur    PLAN:    In order of problems listed above:  Coronary artery calcification and dyspnea on exertion: I discussed my findings with the patient at extensive length.  Secondary prevention stressed.  Importance of compliance with diet medication stressed any vocalized understanding.  We will do a Lexiscan sestamibi to see the significance of these atherosclerotic calcifications.  He is agreeable. He was advised to take a coated baby aspirin on a daily basis. Essential hypertension: Blood pressure stable and diet was emphasized.  Much better.  His blood pressure readings at home mixed dyslipidemia: On lipid-lowering medications.  He will be back in the next few days for blood work.  Accordingly we will titrate his medications.  He might need additional lipid-lowering agents. There is mention of atrial fibrillation but I do not find any documentation for the same.  Patient does not remember having been told to have atrial fibrillation. Obesity: Weight reduction stressed and diet was emphasized and he plans to do better. Diabetes mellitus: Managed by primary care.  Diet emphasized. Patient will be seen in follow-up appointment in 6 months or earlier if the patient has any concerns    Medication Adjustments/Labs and Tests Ordered: Current medicines are reviewed at length with the patient today.  Concerns regarding medicines are outlined above.  No orders of  the defined types were placed in this encounter.  No orders of the defined types were placed in this encounter.    History of Present Illness:    Timothy Chapman is a 76 y.o. male who is being seen today for the evaluation of coronary artery calcification on calcium scoring and dyspnea on exertion at the request of Copland, Gay Filler, MD. patient is a pleasant 76 year old male.  He has history of diabetic neuropathy.  Patient was found to have significant coronary artery calcifications and sent for evaluation.  He has history of essential hypertension dyslipidemia and diabetes mellitus.  Overall he leads a sedentary lifestyle.  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   Cannot tolerate statins   Elevated PSA 09/26/2015   Encounter for rehabilitation 04/25/2015   Erectile dysfunction 01/17/2021   GERD (gastroesophageal reflux disease)    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 esophagitis 01/17/2021   History of adenomatous polyp of colon 06/25/2022   Sep 28, 2015 Entered By: Christene Lye Comment: 2000 polyp; +++; 2012 tics;  *Surveillance 2017Jan 11, 2017 Entered By: Christene Lye Comment: MGM=CRC age 63   History of diverticulitis 02/08/2016   HYPERTENSION, BENIGN ESSENTIAL, UNCONTROLLED 03/12/2009   Qualifier: Diagnosis of  By: Assunta Found MD, Stephen     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   Insomnia 02/08/2016   Malignant neoplasm of prostate ([place]) 0/09/7508   Metabolic syndrome 25/04/5276   Myopia of both eyes 03/21/2016   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   Feb 19, 2020 Entered By: OEUMPN,TIRWE Comment: statin induced   Neuromuscular disorder ([place])    neuropathy but being treated   Nuclear sclerotic cataract of both eyes 03/21/2016   Pain in joint, pelvic region and thigh 06/25/2022   Paroxysmal atrial fibrillation ([place]) 12/16/2017   PONV (postoperative nausea and vomiting)    Presbyopia 03/21/2016   Regular astigmatism of both eyes 03/21/2016   Retinal edema 12/13/2017   SBO (small bowel obstruction) ([place]) 10/08/2017   Septic shock ([place]) 10/04/2017   Sleep apnea    has a sleep study but refuses that he has sleep study, not wearing CPAP   Tributary (branch) retinal vein occlusion, left eye, 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/25/2022    Past Surgical History:  Procedure Laterality Date   BACK SURGERY     X2 - lower   CHOLECYSTECTOMY N/A 12/20/2017   Procedure: LAPAROSCOPIC CHOLECYSTECTOMY WITH INTRAOPERATIVE CHOLANGIOGRAM POSSIBLE OPEN;  Surgeon: Fanny Skates, MD;  Location: [facility];  Service: General;  Laterality: N/A;   COLONOSCOPY     ESOPHAGOGASTRODUODENOSCOPY     IR RADIOLOGIST EVAL & MGMT  10/30/2017   IR RADIOLOGIST EVAL & MGMT  11/14/2017   PROSTATE SURGERY  2017   uro lift per pt.   REPLACEMENT TOTAL KNEE Left    ROTATOR CUFF REPAIR     Bil    Current Medications: Current Meds  Medication Sig   Acetylcarnitine HCl 500 MG CAPS Take 500 mg by mouth daily.   Alcohol Swabs (B-D SINGLE USE SWABS REGULAR) PADS USE PRIOR TO INSULIN DOSE   Alirocumab (PRALUENT) 75 MG/ML SOAJ INJECT '75MG'$  INTO THE SKIN EVERY 14 DAYS   Alpha-Lipoic Acid 600 MG  CAPS Take 600 mg by mouth daily.   aspirin 81 MG chewable tablet Chew 1 tablet (81 mg total) by mouth daily.   diclofenac (VOLTAREN) 75 MG EC tablet Take one BID prn joint pain   famotidine (PEPCID) 20 MG tablet Take 1 tablet (20 mg total) by mouth 2 (two) times daily.   felodipine (PLENDIL) 2.5 MG 24 hr tablet Take 1 tablet (2.5 mg total) by mouth daily.   glipiZIDE (GLUCOTROL) 10 MG tablet Take 1 tablet (10 mg total) by mouth daily before breakfast.   Lancets (ONETOUCH DELICA PLUS [id number]) MISC USE TWO TIMES A DAY TO     CHECK BLOOD SUGAR   melatonin 5 MG TABS Take 5 mg by mouth at bedtime.    metFORMIN (GLUCOPHAGE-XR) 500 MG 24 hr tablet TAKE 2 TABLETS TWICE A DAY  WITH FOOD   Multiple Vitamin (MULTIVITAMIN) tablet Take 1 tablet by mouth daily.   niacin (NIASPAN) 1000 MG CR tablet Take 1,000 mg by mouth at bedtime.  omeprazole (PRILOSEC) 40 MG capsule Take 1 capsule (40 mg total) by mouth daily.   ONETOUCH VERIO test strip USE AS INSTRUCTED   OVER THE COUNTER MEDICATION Take 1 tablet by mouth daily. Tongkat Ali   Semaglutide, 2 MG/DOSE, 8 MG/3ML SOPN Inject 2 mg as directed once a week.   tadalafil (CIALIS) 10 MG tablet Take 1 tablet (10 mg total) by mouth daily as needed for erectile dysfunction. Max 20 mg total per day   tadalafil (CIALIS) 5 MG tablet Take 1 tablet (5 mg total) by mouth daily.   valsartan (DIOVAN) 160 MG tablet Take 1 tablet (160 mg total) by mouth daily.   vitamin C (ASCORBIC ACID) 500 MG tablet Take 500 mg by mouth daily.   VITAMIN D-VITAMIN K PO Take 1 tablet by mouth daily.   zinc gluconate 50 MG tablet Take 50 mg by mouth daily.     Allergies:   Codeine, Farxiga [dapagliflozin], and Statins   Social History   Socioeconomic History   Marital status: Married    Spouse name: Not on file   Number of children: 2   Years of education: Not on file   Highest education level: Not on file  Occupational History   Not on file  Tobacco Use   Smoking status: Former     Types: Cigarettes    Quit date: 09/18/1975    Years since quitting: 46.8   Smokeless tobacco: Never  Vaping Use   Vaping Use: Never used  Substance and Sexual Activity   Alcohol use: Not Currently    Alcohol/week: 0.0 standard drinks of alcohol    Comment: Rarely   Drug use: Never   Sexual activity: Not on file  Other Topics Concern   Not on file  Social History Narrative   Right handed   Caffeine 1-2 cups daily   Lives at home with wife and his son.   Social Determinants of Health   Financial Resource Strain: Low Risk  (02/27/2022)   Overall Financial Resource Strain (CARDIA)    Difficulty of Paying Living Expenses: Not very hard  Food Insecurity: No Food Insecurity (02/27/2022)   Hunger Vital Sign    Worried About Running Out of Food in the Last Year: Never true    Ran Out of Food in the Last Year: Never true  Transportation Needs: No Transportation Needs (02/27/2022)   PRAPARE - Hydrologist (Medical): No    Lack of Transportation (Non-Medical): No  Physical Activity: Insufficiently Active (02/27/2022)   Exercise Vital Sign    Days of Exercise per Week: 2 days    Minutes of Exercise per Session: 20 min  Stress: No Stress Concern Present (02/27/2022)   [place]    Feeling of Stress : Only a little  Social Connections: Unknown (02/27/2022)   Social Connection and Isolation Panel [NHANES]    Frequency of Communication with Friends and Family: More than three times a week    Frequency of Social Gatherings with Friends and Family: More than three times a week    Attends Religious Services: Not on Advertising copywriter or Organizations: No    Attends Archivist Meetings: Never    Marital Status: Married     Family History: The patient's family history includes Colon cancer in his maternal grandmother; Emphysema in his mother; Heart disease in his father; Heart  failure in his father.  ROS:  Please see the history of present illness.    All other systems reviewed and are negative.  EKGs/Labs/Other Studies Reviewed:    The following studies were reviewed today: EKG reveals sinus rhythm and nonspecific ST-T changes   Recent Labs: 03/26/2022: ALT 48; BUN 21; Creatinine, Ser 1.07; Hemoglobin 12.4; Platelets 251.0; Potassium 4.5; Sodium 137; TSH 1.63  Recent Lipid Panel    Component Value Date/Time   CHOL 175 08/16/2021 1058   TRIG (H) 08/16/2021 1058    475.0 Triglyceride is over 400; calculations on Lipids are invalid.   HDL 33.30 (L) 08/16/2021 1058   CHOLHDL 5 08/16/2021 1058   VLDL 57.0 (H) 07/09/2017 0826   LDLCALC  07/06/2020 1448     Comment:     . LDL cholesterol not calculated. Triglyceride levels greater than 400 mg/dL invalidate calculated LDL results. . Reference range: <100 . Desirable range <100 mg/dL for primary prevention;   <70 mg/dL for patients with CHD or diabetic patients  with > or = 2 CHD risk factors. [place] Kitchen LDL-C is now calculated using the Martin-Hopkins  calculation, which is a validated novel method providing  better accuracy than the Friedewald equation in the  estimation of LDL-C.  Cresenciano Genre et al. Annamaria Helling. 7628;315(17): 2061-2068  (http://education.QuestDiagnostics.com/faq/FAQ164)    LDLDIRECT 106.0 08/16/2021 1058    Physical Exam:    VS:  BP (!) 162/92   Pulse 96   Ht '5\' 9"'$  (1.753 m)   Wt 247 lb 0.6 oz (112.1 kg)   SpO2 96%   BMI 36.48 kg/m     Wt Readings from Last 3 Encounters:  06/26/22 247 lb 0.6 oz (112.1 kg)  03/26/22 238 lb 6.4 oz (108.1 kg)  11/15/21 240 lb (108.9 kg)     GEN: Patient is in no acute distress HEENT: Normal NECK: No JVD; No carotid bruits LYMPHATICS: No lymphadenopathy CARDIAC: S1 S2 regular, 2/6 systolic murmur at the apex. RESPIRATORY:  Clear to auscultation without rales, wheezing or rhonchi  ABDOMEN: Soft, non-tender, non-distended MUSCULOSKELETAL:  No edema;  No deformity  SKIN: Warm and dry NEUROLOGIC:  Alert and oriented x 3 PSYCHIATRIC:  Normal affect    Signed, Jenean Lindau, MD  06/26/2022 4:16 PM    [facility]

## 2022-06-28 ENCOUNTER — Other Ambulatory Visit: Payer: Self-pay | Admitting: Family Medicine

## 2022-06-28 ENCOUNTER — Ambulatory Visit: Payer: Medicare HMO | Admitting: Pharmacist

## 2022-06-28 DIAGNOSIS — I10 Essential (primary) hypertension: Secondary | ICD-10-CM

## 2022-06-28 DIAGNOSIS — E785 Hyperlipidemia, unspecified: Secondary | ICD-10-CM

## 2022-06-28 DIAGNOSIS — R931 Abnormal findings on diagnostic imaging of heart and coronary circulation: Secondary | ICD-10-CM

## 2022-06-28 DIAGNOSIS — E1165 Type 2 diabetes mellitus with hyperglycemia: Secondary | ICD-10-CM

## 2022-06-28 MED ORDER — ICOSAPENT ETHYL 1 G PO CAPS: 2.0000 g | ORAL_CAPSULE | Freq: Two times a day (BID) | ORAL | 2 refills | Status: DC

## 2022-06-28 MED ORDER — PRALUENT 75 MG/ML ~~LOC~~ SOAJ: SUBCUTANEOUS | 1 refills | Status: DC

## 2022-06-28 MED ORDER — GLIPIZIDE ER 10 MG PO TB24: 10.0000 mg | ORAL_TABLET | Freq: Every day | ORAL | 0 refills | Status: DC

## 2022-06-28 MED ORDER — ONETOUCH VERIO VI STRP: ORAL_STRIP | 1 refills | Status: DC

## 2022-06-28 NOTE — Progress Notes (Signed)
Pharmacy Note  06/28/2022 Name: Timothy Chapman MRN: [id number] DOB: 1946/07/16  Subjective: Timothy Chapman is a 76 y.o. year old male who is a primary care patient of Copland, Gay Filler, MD. Clinical Pharmacist Practitioner referral was placed to assist with medication, diabetes, hypertension and hyperlipidemia management.    Engaged with patient by telephone for follow up visit today.  Type 2 DM: A1c has improved from 9.1% to 8.5% but not at goal. Patient is taking Ozempic '2mg'$  weekly, metformin ER '500mg'$  = 2 tablets twice a day and glipizide '10mg'$  daily . He reports home blood glucose has been 130 to 220. He is in need of Rx for test strips. He also asks if glipizide would be causing diarrhea each morning. More likely suspect metformin or Ozempic but patient states he is taking metformin with food and had experienced diarrhea with metformin in past but improved with change to ER formula. He would like to try glipizide ER.   Hypertension: blood pressure goal < 130/90. At last visit changes lisinopril to valsartan '160mg'$  daily. Also taking felodipine 2.'5mg'$  daily. Patient reports he is feeling much better since change to valsartan. He has not checked blood pressure in the last week due to no batteries for blood pressure monitor. Previously blood pressure was 135 to 140 / 80 to 90. Blood pressure was elevated at recent cardio visit bu patient states he was rushing and feels that was why blood pressure was elevated.  Hyperlipidemia: Last LDL at goal but triglycerides still elevated. Recent cornary calcium score was elevated. Saw Dr Julianne Rice who has ordered more testing. Patient mentions starting prescription Fish Oil. He has taken Niaspan in past but stopped due to cost. He has not been able to tolerate statin in past. Taking Praluent every 14 days. Has assistance with cost thru [name] - hyperlipidemia fund.  GERD - at out last visit patient has expressed concern that omeprazole could be  causing ED. He tried to lower dose of omeprazole and famotidine but reflux symptoms worsened so he restarted both.    Recent Visits:  06/26/2022 - Cardio (Dr Geraldo Pitter) Seen for elevated coronary artery calcificaiton. Ordered Lexiscan sestamibi to check significant of atherosclerotic calciications. Started EC ASA '81mg'$  daily. F/U 6 months.    Objective: Review of patient status, including review of consultants reports, laboratory and other test data, was performed as part of comprehensive evaluation and provision of chronic care management services.   Lab Results  Component Value Date   CREATININE 1.07 03/26/2022   CREATININE 1.23 11/15/2021   CREATININE 1.43 08/16/2021    Lab Results  Component Value Date   HGBA1C 8.5 (H) 03/26/2022       Component Value Date/Time   CHOL 175 08/16/2021 1058   TRIG (H) 08/16/2021 1058    475.0 Triglyceride is over 400; calculations on Lipids are invalid.   HDL 33.30 (L) 08/16/2021 1058   CHOLHDL 5 08/16/2021 1058   VLDL 57.0 (H) 07/09/2017 0826   LDLCALC  07/06/2020 1448     Comment:     . LDL cholesterol not calculated. Triglyceride levels greater than 400 mg/dL invalidate calculated LDL results. . Reference range: <100 . Desirable range <100 mg/dL for primary prevention;   <70 mg/dL for patients with CHD or diabetic patients  with > or = 2 CHD risk factors. Marland Kitchen LDL-C is now calculated using the Martin-Hopkins  calculation, which is a validated novel method providing  better accuracy than the Friedewald equation in the  estimation of LDL-C.  Cresenciano Genre et al. Annamaria Helling. 9485;462(70): 2061-2068  (http://education.QuestDiagnostics.com/faq/FAQ164)    LDLDIRECT 106.0 08/16/2021 1058     Clinical ASCVD: Yes  The 10-year ASCVD risk score (Arnett DK, et al., 2019) is: 57.3%*   Values used to calculate the score:     Age: 54 years     Sex: Male     Is Non-Hispanic African American: No     Diabetic: Yes     Tobacco smoker: No     Systolic  Blood Pressure: 162 mmHg     Is BP treated: Yes     HDL Cholesterol: 43 mg/dL*     Total Cholesterol: 171 mg/dL*     * - Cholesterol units were assumed for this score calculation    BP Readings from Last 3 Encounters:  06/26/22 (!) 162/92  03/26/22 (!) 144/80  11/15/21 (!) 142/88     Allergies  Allergen Reactions   Codeine     hallucinations   Farxiga [Dapagliflozin] Other (See Comments)    Genital infection and irritation   Statins     Has tried several cannot tolerate    Medications Reviewed Today     Reviewed by Lowella Grip, CMA (Certified Medical Assistant) on 06/26/22 at [place] List Status: <None>   Medication Order Taking? Sig Documenting Provider Last Dose Status Informant  Acetylcarnitine HCl 500 MG CAPS [id number]  Take 500 mg by mouth daily. [provider]  Active Self           Med Note Rosemarie Beath, MELISSA B   Thu Nov 21, 2017 12:03 PM)    Alcohol Swabs (B-D SINGLE USE SWABS REGULAR) PADS [id number]  USE PRIOR TO INSULIN DOSE Copland, Gay Filler, MD  Active   Alirocumab (PRALUENT) 75 MG/ML SOAJ [id number]  INJECT '75MG'$  INTO THE SKIN EVERY 14 DAYS Copland, Gay Filler, MD  Active   Alpha-Lipoic Acid 600 MG CAPS [id number]  Take 600 mg by mouth daily. [provider]  Active Self           Med Note Antony Contras, Ariyan Sinnett B   Tue Apr 04, 2021  1:16 PM)    aspirin 81 MG chewable tablet [id number]  Chew 1 tablet (81 mg total) by mouth daily. Shelly Coss, MD  Active Self  diclofenac (VOLTAREN) 75 MG EC tablet [id number]  Take one BID prn joint pain Copland, Gay Filler, MD  Active   famotidine (PEPCID) 20 MG tablet [id number]  Take 1 tablet (20 mg total) by mouth 2 (two) times daily. Copland, Gay Filler, MD  Active   felodipine (PLENDIL) 2.5 MG 24 hr tablet [id number]  Take 1 tablet (2.5 mg total) by mouth daily. Copland, Gay Filler, MD  Active   glipiZIDE (GLUCOTROL) 10 MG tablet [id number]  Take 1 tablet (10 mg total) by mouth daily before breakfast. Copland, Gay Filler, MD  Active   Lancets (ONETOUCH DELICA PLUS [id number]) [place] [id number]  USE TWO TIMES A DAY TO     CHECK BLOOD SUGAR Copland, Gay Filler, MD  Active   melatonin 5 MG TABS [id number]  Take 5 mg by mouth at bedtime.  [provider]  Active Self  metFORMIN (GLUCOPHAGE-XR) 500 MG 24 hr tablet [id number]  TAKE 2 TABLETS TWICE A DAY  WITH FOOD Copland, Gay Filler, MD  Active   Multiple Vitamin (MULTIVITAMIN) tablet [id number]  Take 1 tablet by mouth daily. [provider]  Active Self  niacin (NIASPAN) 1000 MG CR tablet [id number]  Take 1,000 mg by mouth at bedtime. [provider]  Active            Med Note Antony Contras, Adria Dill Nov 23, 2021  2:06 PM) Patient is taking over-the-counter niacin  omeprazole (PRILOSEC) 40 MG capsule [id number]  Take 1 capsule (40 mg total) by mouth daily. Copland, Gay Filler, MD  Active   [facility] VERIO test strip [id number]  USE AS INSTRUCTED Copland, Gay Filler, MD  Active   OVER THE COUNTER MEDICATION [id number]  Take 1 tablet by mouth daily. Kenard Gower [provider]  Active   Semaglutide, 2 MG/DOSE, 8 MG/3ML SOPN [id number]  Inject 2 mg as directed once a week. Copland, Gay Filler, MD  Active   tadalafil (CIALIS) 10 MG tablet [id number]  Take 1 tablet (10 mg total) by mouth daily as needed for erectile dysfunction. Max 20 mg total per day Copland, Gay Filler, MD  Active   tadalafil (CIALIS) 5 MG tablet [id number]  Take 1 tablet (5 mg total) by mouth daily. Copland, Gay Filler, MD  Active   valsartan (DIOVAN) 160 MG tablet [id number]  Take 1 tablet (160 mg total) by mouth daily. Copland, Gay Filler, MD  Active   vitamin C (ASCORBIC ACID) 500 MG tablet [id number]  Take 500 mg by mouth daily. [provider]  Active Self  VITAMIN D-VITAMIN K PO [id number]  Take 1 tablet by mouth daily. [provider]  Active   zinc gluconate 50 MG tablet [id number]  Take 50 mg by mouth daily. 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   Depression 06/25/2022   Diabetes mellitus without complication ([place]) 54/65/0354   Neuromuscular disorder ([place]) 06/25/2022   PONV (postoperative nausea and vomiting) 06/25/2022   Sleep apnea 06/25/2022   Branch retinal vein occlusion 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/25/2022   Tributary (branch) retinal vein occlusion, left eye, 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/25/2022   History of adenomatous polyp of colon 65/68/1275   Metabolic syndrome 17/00/1749   Pain in joint, pelvic region and thigh 06/25/2022   Diabetes mellitus with neuropathy ([place]) 06/25/2022   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   Malignant neoplasm of prostate ([place]) 03/25/2021   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 esophagitis 01/17/2021   Erectile dysfunction 01/17/2021   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   Paroxysmal atrial fibrillation ([place]) 12/16/2017   Retinal edema 12/13/2017   SBO (small bowel obstruction) ([place]) 10/08/2017   ATN (acute tubular necrosis) ([place]) 10/08/2017   Septic shock ([place]) 10/04/2017   Acute cholecystitis s/p perc cholecystomy drainage 10/04/2017    Acute respiratory failure with hypoxia (HCC)    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   Myopia of both eyes 03/21/2016   Nuclear sclerotic cataract of both eyes 03/21/2016   Presbyopia 03/21/2016   Regular astigmatism of both eyes 03/21/2016   Chronic back pain 02/08/2016   Controlled type 2 diabetes mellitus with diabetic neuropathy, without long-term current use of insulin ([place]) 02/08/2016   Insomnia 02/08/2016   History of diverticulitis 02/08/2016   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 Patient was approved for $2500 from 08/28/2021 thru 08/27/2022.  Provided number for [name] diabetes assistance fund and application for [employer] medication assistance program.    Assessment  / Plan Type 2 DM - not at goal Change to glipizide ER '10mg'$  daily -Rx sent to [facility] Refilled test strips - sent to [facility].  Discussed addition of Jardiance or long acting insulin. Patient declined - has tried [place] in past and afraid he would have similar effect from [place]. Patient is also concerned about weight gain with insulin.  Also discussed Moujaro which he could use in place of Ozempic. Would need to discuss with [place] physician as it will need prior authorization to get thru [place] (patient cannot afford Mounjaro thru his Medicare benefits because he is currently in Medicare coverage gap)  Hypertension: improved home blood pressure but last office blood pressure was still elevated Restart checking blood pressure at home - notify office of blood pressure > 140/90 Continue felodipine 2.'5mg'$  daily and valsartan '160mg'$  daily Hyperlipidemia: LDL at goal but Tg elevated Continue Praluent every 2 weeks Start Vasepa generic 2 capsules twice a day (can use [employer] funds for hyperlipidemia to purchase)  Continue as planned to follow up with Dr Clearence Ped GERD - Continue omeprazole and famotidine.   Follow Up:  Telephone follow up appointment with care management team member scheduled for:  4 to 6 weeks   Cherre Robins, PharmD Clinical Pharmacist [place] [phone]

## 2022-07-06 ENCOUNTER — Ambulatory Visit (HOSPITAL_BASED_OUTPATIENT_CLINIC_OR_DEPARTMENT_OTHER): Admission: RE | Admit: 2022-07-06 | Payer: Medicare HMO | Source: Ambulatory Visit

## 2022-07-09 ENCOUNTER — Ambulatory Visit (HOSPITAL_COMMUNITY)
Admission: RE | Admit: 2022-07-09 | Discharge: 2022-07-09 | Disposition: A | Discharge status: Home / Self Care | Payer: Medicare HMO | Source: Ambulatory Visit | Attending: Cardiology | Admitting: Cardiology

## 2022-07-09 DIAGNOSIS — R0609 Other forms of dyspnea: Secondary | ICD-10-CM

## 2022-07-09 DIAGNOSIS — I517 Cardiomegaly: Secondary | ICD-10-CM | POA: Insufficient documentation

## 2022-07-09 DIAGNOSIS — I480 Paroxysmal atrial fibrillation: Secondary | ICD-10-CM | POA: Diagnosis not present

## 2022-07-09 DIAGNOSIS — I4891 Unspecified atrial fibrillation: Secondary | ICD-10-CM | POA: Diagnosis not present

## 2022-07-09 DIAGNOSIS — R011 Cardiac murmur, unspecified: Secondary | ICD-10-CM

## 2022-07-09 LAB — ECHOCARDIOGRAM COMPLETE
Area-P 1/2: 3.65 cm2
Calc EF: 53.7 %
S' Lateral: 2.7 cm
Single Plane A2C EF: 50 %
Single Plane A4C EF: 56 %

## 2022-07-09 NOTE — Progress Notes (Signed)
  Echocardiogram 2D Echocardiogram has been performed.  Fidel Levy 07/09/2022, 9:50 AM

## 2022-07-10 ENCOUNTER — Other Ambulatory Visit: Payer: Self-pay

## 2022-07-10 ENCOUNTER — Other Ambulatory Visit: Payer: Self-pay | Admitting: Family Medicine

## 2022-07-10 DIAGNOSIS — I480 Paroxysmal atrial fibrillation: Secondary | ICD-10-CM | POA: Diagnosis not present

## 2022-07-10 DIAGNOSIS — E782 Mixed hyperlipidemia: Secondary | ICD-10-CM | POA: Diagnosis not present

## 2022-07-10 MED ORDER — ICOSAPENT ETHYL 1 G PO CAPS: 2.0000 g | ORAL_CAPSULE | Freq: Two times a day (BID) | ORAL | 2 refills | Status: DC

## 2022-07-11 LAB — HEPATIC FUNCTION PANEL
ALT: 33 IU/L (ref 0–44)
AST: 28 IU/L (ref 0–40)
Albumin: 4.5 g/dL (ref 3.8–4.8)
Alkaline Phosphatase: 51 IU/L (ref 44–121)
Bilirubin Total: 0.2 mg/dL (ref 0.0–1.2)
Bilirubin, Direct: <0.1 mg/dL (ref 0.00–0.40)
Total Protein: 7.6 g/dL (ref 6.0–8.5)

## 2022-07-11 LAB — BASIC METABOLIC PANEL
BUN/Creatinine Ratio: 21 (ref 10–24)
BUN: 23 mg/dL (ref 8–27)
CO2: 17 mmol/L — ABNORMAL LOW (ref 20–29)
Calcium: 10.5 mg/dL — ABNORMAL HIGH (ref 8.6–10.2)
Chloride: 104 mmol/L (ref 96–106)
Creatinine, Ser: 1.07 mg/dL (ref 0.76–1.27)
Glucose: 194 mg/dL — ABNORMAL HIGH (ref 70–99)
Potassium: 4.9 mmol/L (ref 3.5–5.2)
Sodium: 139 mmol/L (ref 134–144)
eGFR: 73 mL/min/{1.73_m2} (ref >59)

## 2022-07-11 LAB — LIPOPROTEIN A (LPA): Lipoprotein (a): <8.4 nmol/L (ref <75.0)

## 2022-07-11 LAB — LIPID PANEL
Chol/HDL Ratio: 4.9 ratio (ref 0.0–5.0)
Cholesterol, Total: 162 mg/dL (ref 100–199)
HDL: 33 mg/dL — ABNORMAL LOW (ref >39)
LDL Chol Calc (NIH): 63 mg/dL (ref 0–99)
Triglycerides: 427 mg/dL — ABNORMAL HIGH (ref 0–149)
VLDL Cholesterol Cal: 66 mg/dL — ABNORMAL HIGH (ref 5–40)

## 2022-07-13 ENCOUNTER — Telehealth: Payer: Self-pay

## 2022-07-13 ENCOUNTER — Other Ambulatory Visit: Payer: Self-pay | Admitting: Family Medicine

## 2022-07-13 NOTE — Addendum Note (Signed)
Addended by: Truddie Hidden on: 07/13/2022 09:03 AM   Modules accepted: Orders

## 2022-07-13 NOTE — Telephone Encounter (Signed)
-----   Message from Jenean Lindau, MD sent at 07/12/2022 11:38 AM EDT ----- Refer to lipid clinic Jenean Lindau, MD 07/12/2022 11:37 AM

## 2022-07-20 DIAGNOSIS — N401 Enlarged prostate with lower urinary tract symptoms: Secondary | ICD-10-CM | POA: Diagnosis not present

## 2022-07-20 DIAGNOSIS — E291 Testicular hypofunction: Secondary | ICD-10-CM | POA: Diagnosis not present

## 2022-07-20 DIAGNOSIS — N529 Male erectile dysfunction, unspecified: Secondary | ICD-10-CM | POA: Diagnosis not present

## 2022-07-20 DIAGNOSIS — C61 Malignant neoplasm of prostate: Secondary | ICD-10-CM | POA: Diagnosis not present

## 2022-07-20 DIAGNOSIS — N138 Other obstructive and reflux uropathy: Secondary | ICD-10-CM | POA: Diagnosis not present

## 2022-07-28 ENCOUNTER — Other Ambulatory Visit: Payer: Self-pay | Admitting: Family Medicine

## 2022-07-28 DIAGNOSIS — I10 Essential (primary) hypertension: Secondary | ICD-10-CM

## 2022-07-28 DIAGNOSIS — M2550 Pain in unspecified joint: Secondary | ICD-10-CM

## 2022-08-01 ENCOUNTER — Ambulatory Visit (INDEPENDENT_AMBULATORY_CARE_PROVIDER_SITE_OTHER): Payer: Medicare HMO | Admitting: Pharmacist

## 2022-08-01 DIAGNOSIS — R931 Abnormal findings on diagnostic imaging of heart and coronary circulation: Secondary | ICD-10-CM

## 2022-08-01 DIAGNOSIS — G720 Drug-induced myopathy: Secondary | ICD-10-CM

## 2022-08-01 DIAGNOSIS — E1165 Type 2 diabetes mellitus with hyperglycemia: Secondary | ICD-10-CM

## 2022-08-01 DIAGNOSIS — I10 Essential (primary) hypertension: Secondary | ICD-10-CM

## 2022-08-01 DIAGNOSIS — K219 Gastro-esophageal reflux disease without esophagitis: Secondary | ICD-10-CM

## 2022-08-01 DIAGNOSIS — E785 Hyperlipidemia, unspecified: Secondary | ICD-10-CM

## 2022-08-01 NOTE — Progress Notes (Unsigned)
Pharmacy Note  08/01/2022 Name: Timothy Chapman MRN: [id number] DOB: 1946-08-27  Subjective: Timothy Chapman is a 76 y.o. year old male who is a primary care patient of Copland, Gay Filler, MD. Clinical Pharmacist Practitioner referral was placed to assist with medication, diabetes, hypertension and hyperlipidemia management.    Engaged with patient by telephone for follow up visit today.  Type 2 DM:  A1c has improved from 9.1% to 8.5% but not at goal. Patient is taking Ozempic '2mg'$  weekly, metformin ER '500mg'$  = 2 tablets twice a day and glipizide ER '10mg'$  daily. He reports home blood glucose has been variable. Lowest in 140's and high in low 200's. Today after lunch blood glucose was 151. Denies hypoglycemia.    Hypertension: blood pressure goal < 130/90. He is taking valsartan '160mg'$  daily. He is also prescribed felodipine 2.'5mg'$  daily but has not taken in the last few weeks because he has had trouble getting it filled. He reports he feels better just taking valsartan but his blood pressure was elevated at recent office visit with Dr Pilar Jarvis, urologist. Blood pressure was 180/98.  Hyperlipidemia: Last LDL at goal but triglycerides still elevated. Recent cornary calcium score was elevated. Saw Dr Julianne Rice who has ordered more testing. Patient mentions starting prescription Fish Oil. Also taking Praluent every 14 days. Has assistance with cost thru [name] - hyperlipidemia fund.  He has received letter from his [facility] plan that in [place] will be preferred.  GERD - Improved since he has restarted  omeprazole and famotidine. Due refill for oemprazole/   Recent Visits:  06/26/2022 - Cardio (Dr Geraldo Pitter) Seen for elevated coronary artery calcificaiton. Ordered Lexiscan sestamibi to check significant of atherosclerotic calciications. Started EC ASA '81mg'$  daily. F/U 6 months.   Objective: Review of patient status, including review of consultants reports, laboratory and other  test data, was performed as part of comprehensive evaluation and provision of chronic care management services.   Lab Results  Component Value Date   CREATININE 1.07 07/10/2022   CREATININE 1.07 03/26/2022   CREATININE 1.23 11/15/2021    Lab Results  Component Value Date   HGBA1C 8.5 (H) 03/26/2022       Component Value Date/Time   CHOL 162 07/10/2022 1036   TRIG 427 (H) 07/10/2022 1036   HDL 33 (L) 07/10/2022 1036   CHOLHDL 4.9 07/10/2022 1036   CHOLHDL 5 08/16/2021 1058   VLDL 57.0 (H) 07/09/2017 0826   LDLCALC 63 07/10/2022 1036   LDLCALC  07/06/2020 1448     Comment:     . LDL cholesterol not calculated. Triglyceride levels greater than 400 mg/dL invalidate calculated LDL results. . Reference range: <100 . Desirable range <100 mg/dL for primary prevention;   <70 mg/dL for patients with CHD or diabetic patients  with > or = 2 CHD risk factors. Marland Kitchen LDL-C is now calculated using the Martin-Hopkins  calculation, which is a validated novel method providing  better accuracy than the Friedewald equation in the  estimation of LDL-C.  Cresenciano Genre et al. Annamaria Helling. 7169;678(93): 2061-2068  (http://education.QuestDiagnostics.com/faq/FAQ164)    LDLDIRECT 106.0 08/16/2021 1058     Clinical ASCVD: Yes  The 10-year ASCVD risk score (Arnett DK, et al., 2019) is: 69.8%   Values used to calculate the score:     Age: 42 years     Sex: Male     Is Non-Hispanic African American: No     Diabetic: Yes     Tobacco smoker: No  Systolic Blood Pressure: 591 mmHg     Is BP treated: Yes     HDL Cholesterol: 33 mg/dL     Total Cholesterol: 162 mg/dL    BP Readings from Last 3 Encounters:  06/26/22 (!) 162/92  03/26/22 (!) 144/80  11/15/21 (!) 142/88     Allergies  Allergen Reactions   Codeine     hallucinations   Farxiga [Dapagliflozin] Other (See Comments)    Genital infection and irritation   Statins     Has tried several cannot tolerate    Medications Reviewed Today      Reviewed by Cherre Robins, RPH-CPP (Pharmacist) on 06/28/22 at 46  Med List Status: <None>   Medication Order Taking? Sig Documenting Provider Last Dose Status Informant  Acetylcarnitine HCl 500 MG CAPS [id number] Yes Take 500 mg by mouth daily. [provider] Taking Active Self           Med Note Rosemarie Beath, MELISSA B   Thu Nov 21, 2017 12:03 PM)    Alcohol Swabs (B-D SINGLE USE SWABS REGULAR) PADS [id number] Yes USE PRIOR TO INSULIN DOSE Copland, Gay Filler, MD Taking Active   Alirocumab (PRALUENT) 75 MG/ML Darden Palmer [id number] Yes INJECT  '75MG'$   SUBCUTANEOUSLY EVERY 14 DAYS Copland, Gay Filler, MD Taking Active   Alpha-Lipoic Acid 600 MG CAPS [id number] Yes Take 600 mg by mouth daily. [provider] Taking Active Self           Med Note Antony Contras, Callaway Hardigree B   Tue Apr 04, 2021  1:16 PM)    aspirin 81 MG chewable tablet [id number] Yes Chew 1 tablet (81 mg total) by mouth daily. Shelly Coss, MD Taking Active Self  diclofenac (VOLTAREN) 75 MG EC tablet [id number] Yes Take one BID prn joint pain Copland, Gay Filler, MD Taking Active   famotidine (PEPCID) 20 MG tablet [id number] Yes Take 1 tablet (20 mg total) by mouth 2 (two) times daily. Copland, Gay Filler, MD Taking Active   felodipine (PLENDIL) 2.5 MG 24 hr tablet [id number] Yes Take 1 tablet (2.5 mg total) by mouth daily. Copland, Gay Filler, MD Taking Active   glipiZIDE (GLUCOTROL) 10 MG tablet [id number] Yes Take 1 tablet (10 mg total) by mouth daily before breakfast. Copland, Gay Filler, MD Taking Active   Lancets (ONETOUCH DELICA PLUS [id number]) [place] [id number] Yes USE TWO TIMES A DAY TO     CHECK BLOOD SUGAR Copland, Gay Filler, MD Taking Active   melatonin 5 MG TABS [id number] Yes Take 5 mg by mouth at bedtime.  [provider] Taking Active Self  metFORMIN (GLUCOPHAGE-XR) 500 MG 24 hr tablet [id number] Yes TAKE 2 TABLETS TWICE A DAY  WITH FOOD Copland, Gay Filler, MD Taking Active   Multiple Vitamin (MULTIVITAMIN) tablet [id number] Yes  Take 1 tablet by mouth daily. [provider] Taking Active Self  niacin (NIASPAN) 1000 MG CR tablet [id number] No Take 1,000 mg by mouth at bedtime.  Patient not taking: Reported on 06/28/2022   [provider] Not Taking Active            Med Note Antony Contras, Adria Dill Nov 23, 2021  2:06 PM) Patient is taking over-the-counter niacin  omeprazole (PRILOSEC) 40 MG capsule [id number] Yes Take 1 capsule (40 mg total) by mouth daily. Copland, Gay Filler, MD Taking Active   [facility] VERIO test strip [id number] Yes USE AS INSTRUCTED Copland, Gay Filler, MD Taking Active   OVER THE COUNTER MEDICATION [id number] Yes Take 1 tablet by  mouth daily. Kenard Gower [provider] Taking Active   Semaglutide, 2 MG/DOSE, 8 MG/3ML SOPN [id number] Yes Inject 2 mg as directed once a week. Copland, Gay Filler, MD Taking Active   tadalafil (CIALIS) 10 MG tablet [id number] Yes Take 1 tablet (10 mg total) by mouth daily as needed for erectile dysfunction. Max 20 mg total per day Copland, Gay Filler, MD Taking Active   tadalafil (CIALIS) 5 MG tablet [id number] Yes Take 1 tablet (5 mg total) by mouth daily. Copland, Gay Filler, MD Taking Active   valsartan (DIOVAN) 160 MG tablet [id number] Yes Take 1 tablet (160 mg total) by mouth daily. Copland, Gay Filler, MD Taking Active   vitamin C (ASCORBIC ACID) 500 MG tablet [id number] Yes Take 500 mg by mouth daily. [provider] Taking Active Self  VITAMIN D-VITAMIN K PO [id number] Yes Take 1 tablet by mouth daily. [provider] Taking Active   zinc gluconate 50 MG tablet [id number] Yes Take 50 mg by mouth daily. 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/25/2022   Tributary (branch) retinal vein occlusion, left eye, 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/25/2022   History of adenomatous polyp of colon 01/65/5374   Metabolic syndrome 82/70/7867   Pain in joint, pelvic region and thigh 06/25/2022   Diabetes mellitus with neuropathy ([place]) 06/25/2022   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   Malignant neoplasm of prostate ([place]) 03/25/2021   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 esophagitis 01/17/2021   Erectile dysfunction 01/17/2021   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   Paroxysmal atrial fibrillation ([place]) 12/16/2017   Retinal edema 12/13/2017   SBO (small bowel obstruction) ([place]) 10/08/2017   ATN (acute tubular necrosis) ([place]) 10/08/2017   Septic shock ([place]) 10/04/2017   Acute cholecystitis s/p perc cholecystomy drainage 10/04/2017    Acute respiratory failure with hypoxia (HCC)    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   Myopia of both eyes 03/21/2016   Nuclear sclerotic cataract of both eyes 03/21/2016   Presbyopia 03/21/2016   Regular astigmatism of both eyes 03/21/2016   Chronic back pain 02/08/2016   Controlled type 2 diabetes mellitus with diabetic neuropathy, without long-term current use of insulin ([place]) 02/08/2016   Insomnia 02/08/2016   History of diverticulitis 02/08/2016   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 Patient was approved for $2500 from  07/30/2022 thru 08/28/2023.   Assessment  / Plan Type 2 DM - not at goal of A1c < 7.0% Discussed dietary change to help with blood glucose.   Also discussed either switching to [facility] (would start with 5 or 7.'5mg'$  weekly) or adding long acting insulin. Patient would  need to get [place] from [place]. Reviewed prior authorization criteria and he should be able prior authorization for [place] thru [place]. Patient to contact his PCP at the [facility] regarding [place].   Hypertension:  not at goal of < 130/80 Increase valsartan to '320mg'$  daily  Hyperlipidemia:  TG not at goal and LDL was 63.  Reapplied and approved for assistance thru [employer] for hyperlipidemia.  Patient to continue Praulent thru 2023 and will then switch to Repatha due to change in insurance formulary in 2024.  Continue Vascepa 2 caps twice a day Patient to follow up with cardio January 2024.   GERD - .Controlled with current therapy. Patient has refills on file at [facility] for omeprazole. He will request.   Follow Up:  Telephone follow up appointment with care management team member scheduled for:  4 to 6 weeks   Cherre Robins, PharmD Clinical Pharmacist [place] [phone]

## 2022-08-02 DIAGNOSIS — R051 Acute cough: Secondary | ICD-10-CM | POA: Diagnosis not present

## 2022-08-06 MED ORDER — VALSARTAN 320 MG PO TABS: 320.0000 mg | ORAL_TABLET | Freq: Every day | ORAL | 1 refills | Status: DC

## 2022-08-08 ENCOUNTER — Other Ambulatory Visit: Payer: Self-pay | Admitting: Pharmacist

## 2022-08-08 ENCOUNTER — Encounter: Payer: Self-pay | Admitting: Family Medicine

## 2022-08-08 DIAGNOSIS — N401 Enlarged prostate with lower urinary tract symptoms: Secondary | ICD-10-CM

## 2022-08-08 DIAGNOSIS — N529 Male erectile dysfunction, unspecified: Secondary | ICD-10-CM

## 2022-08-08 NOTE — Telephone Encounter (Signed)
Patient is requesting updated Rx for tadalafil '5mg'$  daily and '10mg'$  as needed (max '20mg'$  daily) Requesting to be send to [place].

## 2022-08-12 MED ORDER — TADALAFIL 5 MG PO TABS: 5.0000 mg | ORAL_TABLET | Freq: Every day | ORAL | 3 refills | Status: DC

## 2022-08-12 MED ORDER — TADALAFIL 10 MG PO TABS: 10.0000 mg | ORAL_TABLET | Freq: Every day | ORAL | 2 refills | Status: DC | PRN

## 2022-08-14 ENCOUNTER — Other Ambulatory Visit: Payer: Self-pay | Admitting: Family Medicine

## 2022-08-14 DIAGNOSIS — I10 Essential (primary) hypertension: Secondary | ICD-10-CM

## 2022-08-20 ENCOUNTER — Encounter: Payer: Self-pay | Admitting: Family Medicine

## 2022-08-20 DIAGNOSIS — E785 Hyperlipidemia, unspecified: Secondary | ICD-10-CM

## 2022-08-21 NOTE — Addendum Note (Signed)
Addended by: Lamar Blinks C on: 08/21/2022 04:56 PM   Modules accepted: Orders

## 2022-08-22 ENCOUNTER — Telehealth: Payer: Self-pay

## 2022-08-22 MED ORDER — REPATHA 140 MG/ML ~~LOC~~ SOSY: 140.0000 mg | PREFILLED_SYRINGE | SUBCUTANEOUS | 6 refills | Status: DC

## 2022-08-22 NOTE — Addendum Note (Signed)
Addended by: Lamar Blinks C on: 08/22/2022 06:01 AM   Modules accepted: Orders

## 2022-08-22 NOTE — Telephone Encounter (Signed)
Initiated:   Timothy Chapman (Key: [id number])

## 2022-08-27 ENCOUNTER — Encounter: Payer: Self-pay | Admitting: Family Medicine

## 2022-08-27 NOTE — Telephone Encounter (Signed)
PA denied: I think its too early to try to send for the PA, might have to  try at the turn of the year.

## 2022-09-28 ENCOUNTER — Telehealth: Payer: Self-pay

## 2022-09-28 NOTE — Telephone Encounter (Signed)
PA initiated via Covermymeds; KEY: [id number]. Awaiting determination.

## 2022-09-30 NOTE — Progress Notes (Deleted)
Timothy Chapman at [facility] [address] [place], [place] [address] [phone]  Date:  10/03/2022   Name:  [facility]   DOB:  [birth year]   MRN:  [id number]  PCP:  Timothy Mclean, MD    Chief Complaint: No chief complaint on file.   History of Present Illness:  Timothy Chapman is a 77 y.o. very pleasant male patient who presents with the following:  Pt seen today for follow-up Last seen by myself in July History of diabetes, hypertension, chronic back pain, severe illness in 2019-he became ill with sepsis due to cholangitis, had acute respiratory failure and required intubation He receives some care through the [place] History of prostate cancer currently on active surveillance   Married to Timothy Chapman, their son is 77-year-old  Needs urine micro Covid booster Eye exam Foot exam A1c is due for update Flu shot  Coronary calcium done in the summer- did not do great  He has since seen cardiology  1. Paroxysmal atrial fibrillation (HCC)   2. Diabetes mellitus due to underlying condition with diabetic neuropathy, without long-term current use of insulin ([place])   3. Essential hypertension   4. Mixed dyslipidemia   5. Obesity (BMI 35.0-39.9 without comorbidity)   6. Dyspnea on exertion   7. Cardiac murmur     PLAN:     Coronary artery calcification and dyspnea on exertion: I discussed my findings with the patient at extensive length.  Secondary prevention stressed.  Importance of compliance with diet medication stressed any vocalized understanding.  We will do a Lexiscan sestamibi to see the significance of these atherosclerotic calcifications.  He is agreeable. He was advised to take a coated baby aspirin on a daily basis. Essential hypertension: Blood pressure stable and diet was emphasized.  Much better.  His blood pressure readings at home mixed dyslipidemia: On lipid-lowering medications.  He will be back in the next few days  for blood work.  Accordingly we will titrate his medications.  He might need additional lipid-lowering agents. There is mention of atrial fibrillation but I do not find any documentation for the same.  Patient does not remember having been told to have atrial fibrillation. Obesity: Weight reduction stressed and diet was emphasized and he plans to do better. Diabetes mellitus: Managed by primary care.  Diet emphasized. 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   Depression 06/25/2022   Diabetes mellitus without complication ([place]) 99991111   Neuromuscular disorder ([place]) 06/25/2022   PONV (postoperative nausea and vomiting) 06/25/2022   Sleep apnea 06/25/2022   Branch retinal vein occlusion 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/25/2022   Tributary (branch) retinal vein occlusion, left eye, 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/25/2022   History of adenomatous polyp of colon 99991111   Metabolic syndrome 99991111   Pain in joint, pelvic region and thigh 06/25/2022   Diabetes mellitus with neuropathy ([place]) 06/25/2022   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   Malignant neoplasm of prostate ([place]) 03/25/2021  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 esophagitis 01/17/2021   Erectile dysfunction 01/17/2021   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   Paroxysmal atrial fibrillation (HCC) 12/16/2017   Retinal edema 12/13/2017   SBO (small bowel obstruction) ([place]) 10/08/2017   ATN (acute tubular necrosis) ([place]) 10/08/2017   Septic shock ([place]) 10/04/2017   Acute cholecystitis s/p perc  cholecystomy drainage 10/04/2017    Acute respiratory failure with hypoxia (HCC)    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   Myopia of both eyes 03/21/2016   Nuclear sclerotic cataract of both eyes 03/21/2016   Presbyopia 03/21/2016   Regular astigmatism of both eyes 03/21/2016   Chronic back pain 02/08/2016   Controlled type 2 diabetes mellitus with diabetic neuropathy, without long-term current use of insulin ([place]) 02/08/2016   Insomnia 02/08/2016   History of diverticulitis 02/08/2016   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   Elevated PSA 09/26/2015   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   Encounter for rehabilitation 04/25/2015   HYPERTENSION, BENIGN ESSENTIAL, UNCONTROLLED 03/12/2009   ABNORMAL ELECTROCARDIOGRAM 03/12/2009    Past Medical History:  Diagnosis Date   ABNORMAL ELECTROCARDIOGRAM 03/12/2009   Qualifier: Diagnosis of  By: Assunta Found MD, Timothy Chapman     Acute cholecystitis s/p perc cholecystomy drainage 10/04/2017    Acute respiratory failure with hypoxia ([place])    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   Cannot tolerate statins   Elevated PSA 09/26/2015   Encounter for rehabilitation 04/25/2015   Erectile dysfunction 01/17/2021   GERD (gastroesophageal reflux disease)    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 esophagitis 01/17/2021    History of adenomatous polyp of colon 06/25/2022   Sep 28, 2015 Entered By: Christene Lye Comment: 2000 polyp; +++; 2012 tics; *Surveillance 2017Jan 11, 2017 Entered By: Christene Lye Comment: MGM=CRC age 64   History of diverticulitis 02/08/2016   HYPERTENSION, BENIGN ESSENTIAL, UNCONTROLLED 03/12/2009   Qualifier: Diagnosis of  By: Assunta Found MD, Timothy Chapman     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   Insomnia 02/08/2016   Malignant neoplasm of prostate ([place]) Q000111Q   Metabolic syndrome [id number]   Myopia of both eyes 03/21/2016   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   Feb 19, 2020 Entered By: [id number] Comment: statin induced   Neuromuscular disorder ([place])    neuropathy but being treated   Nuclear sclerotic cataract of both eyes 03/21/2016   Pain in joint, pelvic region and thigh 06/25/2022   Paroxysmal atrial fibrillation ([place]) 12/16/2017   PONV (postoperative nausea and vomiting)    Presbyopia 03/21/2016   Regular astigmatism of both eyes 03/21/2016   Retinal edema 12/13/2017   SBO (small bowel obstruction) ([place] [facility]) 10/08/2017   Septic shock ([place]) 10/04/2017   Sleep apnea    has a sleep study but refuses that he has sleep study, not wearing CPAP   Tributary (branch) retinal vein occlusion, left eye, 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/25/2022    Past  Surgical History:  Procedure Laterality Date   BACK SURGERY     X2 - lower   CHOLECYSTECTOMY N/A 12/20/2017   Procedure: LAPAROSCOPIC CHOLECYSTECTOMY WITH INTRAOPERATIVE CHOLANGIOGRAM POSSIBLE OPEN;  Surgeon: Fanny Skates, MD;  Location: [facility];  Service: General;  Laterality: N/A;   COLONOSCOPY     ESOPHAGOGASTRODUODENOSCOPY     IR RADIOLOGIST EVAL & MGMT  10/30/2017   IR RADIOLOGIST EVAL & MGMT  11/14/2017   PROSTATE SURGERY  2017   uro lift per pt.   REPLACEMENT TOTAL KNEE Left    ROTATOR CUFF REPAIR     Bil    Social History   Tobacco Use   Smoking status: Former    Types: Cigarettes    Quit date: 09/18/1975    Years since quitting: 47.0   Smokeless tobacco: Never  Vaping Use    Vaping Use: Never used  Substance Use Topics   Alcohol use: Not Currently    Alcohol/week: 0.0 standard drinks of alcohol    Comment: Rarely   Drug use: Never    Family History  Problem Relation Age of Onset   Heart failure Father    Heart disease Father    Emphysema Mother    Colon cancer Maternal Grandmother     Allergies  Allergen Reactions   Codeine     hallucinations   Farxiga [Dapagliflozin] Other (See Comments)    Genital infection and irritation   Statins     Has tried several cannot tolerate    Medication list has been reviewed and updated.  Current Outpatient Medications on File Prior to Visit  Medication Sig Dispense Refill   Acetylcarnitine HCl 500 MG CAPS Take 500 mg by mouth daily.     Alcohol Swabs (B-D SINGLE USE SWABS REGULAR) PADS USE PRIOR TO INSULIN DOSE 200 each 7   Alirocumab (PRALUENT) 75 MG/ML SOAJ INJECT  75MG  SUBCUTANEOUSLY EVERY 14 DAYS 6 mL 1   Alpha-Lipoic Acid 600 MG CAPS Take 600 mg by mouth daily.     aspirin 81 MG chewable tablet Chew 1 tablet (81 mg total) by mouth daily. 30 tablet 0   diclofenac (VOLTAREN) 75 MG EC tablet Take 1 tablet (75 mg total) by mouth 2 (two) times daily as needed (joint pain). 180 tablet 0   Evolocumab (REPATHA) 140 MG/ML SOSY Inject 140 mg into the skin every 14 (fourteen) days. 2.1 mL 6   famotidine (PEPCID) 20 MG tablet Take 1 tablet (20 mg total) by mouth 2 (two) times daily. 180 tablet 3   felodipine (PLENDIL) 2.5 MG 24 hr tablet Take 1 tablet by mouth once daily (Patient not taking: Reported on 08/01/2022) 90 tablet 0   glipiZIDE (GLUCOTROL XL) 10 MG 24 hr tablet Take 1 tablet (10 mg total) by mouth daily with breakfast. 90 tablet 0   glucose blood (ONETOUCH VERIO) test strip Use to check blood glucose twice a day 200 strip 1   Lancets (ONETOUCH DELICA PLUS Q000111Q) MISC USE TWO TIMES A DAY TO     CHECK BLOOD SUGAR 100 each 3   melatonin 5 MG TABS Take 5 mg by mouth at bedtime.      metFORMIN  (GLUCOPHAGE-XR) 500 MG 24 hr tablet TAKE 2 TABLETS TWICE A DAY  WITH FOOD 360 tablet 3   Multiple Vitamin (MULTIVITAMIN) tablet Take 1 tablet by mouth daily.     niacin (NIASPAN) 1000 MG CR tablet Take 1,000 mg by mouth at bedtime.     omeprazole (PRILOSEC) 40  MG capsule Take 1 capsule (40 mg total) by mouth daily. 90 capsule 3   OVER THE COUNTER MEDICATION Take 1 tablet by mouth daily. Kenard Gower (Patient not taking: Reported on 08/01/2022)     Semaglutide, 2 MG/DOSE, 8 MG/3ML SOPN Inject 2 mg as directed once a week. 9 mL 1   tadalafil (CIALIS) 10 MG tablet Take 1 tablet (10 mg total) by mouth daily as needed for erectile dysfunction. Max 20 mg total per day 90 tablet 2   tadalafil (CIALIS) 5 MG tablet Take 1 tablet (5 mg total) by mouth daily. 90 tablet 3   valsartan (DIOVAN) 320 MG tablet Take 1 tablet (320 mg total) by mouth daily. 90 tablet 1   vitamin C (ASCORBIC ACID) 500 MG tablet Take 500 mg by mouth daily.     VITAMIN D-VITAMIN K PO Take 1 tablet by mouth daily.     zinc gluconate 50 MG tablet Take 50 mg by mouth daily.     No current facility-administered medications on file prior to visit.    Review of Systems:  As per HPI- otherwise negative.  Physical Examination: There were no vitals filed for this visit. There were no vitals filed for this visit. There is no height or weight on file to calculate BMI. Ideal Body Weight:    GEN: no acute distress. HEENT: Atraumatic, Normocephalic.  Ears and Nose: No external deformity. CV: RRR, No M/G/R. No JVD. No thrill. No extra heart sounds. PULM: CTA B, no wheezes, crackles, rhonchi. No retractions. No resp. distress. No accessory muscle use. ABD: S, NT, ND, +BS. No rebound. No HSM. EXTR: No c/c/e PSYCH: Normally interactive. Conversant.    Assessment and Plan: ***  Signed Lamar Blinks, MD

## 2022-09-30 NOTE — Patient Instructions (Incomplete)
Good to see you again today Consider getting a covid booster and dose of RSV at your pharmacy

## 2022-10-01 NOTE — Telephone Encounter (Signed)
Hi Kaylyn-his insurance change, we are changing him from Praluent to [place] Can you try a prior Auth for the [place]? Thank you so much

## 2022-10-01 NOTE — Telephone Encounter (Signed)
PA was denied for Praluent, I see Repatha also on his med list. Is this correct?

## 2022-10-02 NOTE — Telephone Encounter (Signed)
Repatha- PA initiated via Covermymeds; KEY: [id number]. Awaiting determination.

## 2022-10-02 NOTE — Telephone Encounter (Signed)
PA approved. Effective 09/17/2022 - 09/17/2023. For Repatha

## 2022-10-02 NOTE — Addendum Note (Signed)
Addended byDamita Dunnings D on: 10/02/2022 09:41 AM   Modules accepted: Orders

## 2022-10-03 ENCOUNTER — Ambulatory Visit: Payer: Medicare HMO | Admitting: Family Medicine

## 2022-10-03 DIAGNOSIS — E785 Hyperlipidemia, unspecified: Secondary | ICD-10-CM

## 2022-10-03 DIAGNOSIS — R931 Abnormal findings on diagnostic imaging of heart and coronary circulation: Secondary | ICD-10-CM

## 2022-10-03 DIAGNOSIS — E1165 Type 2 diabetes mellitus with hyperglycemia: Secondary | ICD-10-CM

## 2022-10-03 DIAGNOSIS — I10 Essential (primary) hypertension: Secondary | ICD-10-CM

## 2022-10-03 DIAGNOSIS — I2510 Atherosclerotic heart disease of native coronary artery without angina pectoris: Secondary | ICD-10-CM

## 2022-10-11 ENCOUNTER — Ambulatory Visit (INDEPENDENT_AMBULATORY_CARE_PROVIDER_SITE_OTHER): Payer: Medicare HMO | Admitting: Pharmacist

## 2022-10-11 DIAGNOSIS — E785 Hyperlipidemia, unspecified: Secondary | ICD-10-CM

## 2022-10-11 DIAGNOSIS — K219 Gastro-esophageal reflux disease without esophagitis: Secondary | ICD-10-CM

## 2022-10-11 DIAGNOSIS — E1140 Type 2 diabetes mellitus with diabetic neuropathy, unspecified: Secondary | ICD-10-CM

## 2022-10-11 DIAGNOSIS — I10 Essential (primary) hypertension: Secondary | ICD-10-CM

## 2022-10-11 MED ORDER — SEMAGLUTIDE (2 MG/DOSE) 8 MG/3ML ~~LOC~~ SOPN: 2.0000 mg | PEN_INJECTOR | SUBCUTANEOUS | 0 refills | Status: DC

## 2022-10-11 NOTE — Patient Instructions (Signed)
Timothy Chapman It was a pleasure speaking with you today.  Below is a summary of your health goals and summary of our recent visit.   Diabetes:  Contact your primary care provider at the [facility] regarding [place]. I think this might be a good option for you and you should meet the prior authorization criteria Until then - try to dose of Ozempic down a little to 1.'5mg'$  weekly which is 56 clicks with the '2mg'$  pen. See handout.  Continue to check blood glucose daily - ome blood glucose goals  Fasting blood glucose goal (before meals) = 80 to 130 Blood glucose goal after a meal = less than 180     Hypertension: goal < 130/80 Continue valsartan to '320mg'$  daily Continue to check blood pressure daily and record.   Hyperlipidemia:  TG not at goal of < 150; LDL at goal of < 70   Reapplied and approved for assistance thru [employer] for hyperlipidemia however they are requesting proof of income before pharmacy card if activated. Bring this information the office and we can send in for you.  Start Repatha when completes current Praluent prescription  You also can request Repatha from the [place] and this will prevent or delay you reaching the  Medicare coverage gap with [employer].  Acid Reflux -  See below for dietary tips to prevent acid reflux.   Remember to make appointment to see Dr Lorelei Pont - labs are due to be rechecked.   As always if you have any questions or concerns especially regarding medications, please feel free to contact me either at the phone number below or with a MyChart message.   Keep up the good work!  Cherre Robins, PharmD Clinical Pharmacist [place] [phone] (direct line)  [phone] (main office number)   The patient verbalized understanding of instructions, educational materials, and care plan provided today and agreed to receive a mailed copy of patient instructions, educational materials, and care plan.

## 2022-10-11 NOTE — Progress Notes (Signed)
Pharmacy Note  10/11/2022 Name: Timothy Chapman MRN: [id number] DOB: 1946-04-25  Subjective: Timothy Chapman is a 77 y.o. year old male who is a primary care patient of Copland, Gay Filler, MD. Clinical Pharmacist Practitioner referral was placed to assist with medication, diabetes, hypertension and hyperlipidemia management.    Engaged with patient by telephone for follow up visit today.  Type 2 DM:  A1c has improved from 9.1% to 8.5% but not at goal. Patient is taking Ozempic '2mg'$  weekly, metformin ER '500mg'$  = 2 tablets twice a day and glipizide ER '10mg'$  daily. He reports home blood glucose has been variable. Lowest in 120's, mostly blood glucose has been 140 to 160 but some in 200's. Denies hypoglycemia Reports that usually 2 days after each week's Ozempic dose he has nausea and will throw up.  He gets Ozempic from [place] but is not always reliable and they only dispense '1mg'$  strength so he has to take 2 injection per week.     Hypertension: blood pressure goal < 130/90.  Current treatment: valsartan '160mg'$  daily.  Patient has stopped felodipine 2.'5mg'$  daily on his own because he felt it was causing his lethargy.  Home blood pressure : 120 to 140 / 80's.   Hyperlipidemia: Last LDL at goal but triglycerides still elevated. Recent cornary calcium score was elevated. Saw Dr Julianne Rice who has ordered more testing.  Patient stopped Vascepa due to indigestion.  Has been taking PCSK9 agent with good results on LDL. In 2023 he was taking Praluent but is not on his [place] for 2023. Changed to [place]. Patient has not needed to fill Repatha yet.   GERD -  Current treatment: omeprazole and famotidine.  Diet - tries to limit coffee to 1 or 2 cups per day (previously was drinking 3 or 4 per day), no carbonates drinks.  He eats lots of tomato based foods and has noted that they can trigger GERD.    Objective: Review of patient status, including review of consultants reports, laboratory and other  test data, was performed as part of comprehensive evaluation and provision of chronic care management services.   Lab Results  Component Value Date   CREATININE 1.07 07/10/2022   CREATININE 1.07 03/26/2022   CREATININE 1.23 11/15/2021    Lab Results  Component Value Date   HGBA1C 8.5 (H) 03/26/2022       Component Value Date/Time   CHOL 162 07/10/2022 1036   TRIG 427 (H) 07/10/2022 1036   HDL 33 (L) 07/10/2022 1036   CHOLHDL 4.9 07/10/2022 1036   CHOLHDL 5 08/16/2021 1058   VLDL 57.0 (H) 07/09/2017 0826   LDLCALC 63 07/10/2022 1036   LDLCALC  07/06/2020 1448     Comment:     . LDL cholesterol not calculated. Triglyceride levels greater than 400 mg/dL invalidate calculated LDL results. . Reference range: <100 . Desirable range <100 mg/dL for primary prevention;   <70 mg/dL for patients with CHD or diabetic patients  with > or = 2 CHD risk factors. Marland Kitchen LDL-C is now calculated using the Martin-Hopkins  calculation, which is a validated novel method providing  better accuracy than the Friedewald equation in the  estimation of LDL-C.  Cresenciano Genre et al. Annamaria Helling. 7353;299(24): 2061-2068  (http://education.[facility].com/faq/FAQ164)    LDLDIRECT 106.0 08/16/2021 1058     Clinical ASCVD: Yes  The 10-year ASCVD risk score (Arnett DK, et al., 2019) is: 69.8%   Values used to calculate the score:     Age:  56 years     Sex: Male     Is Non-Hispanic African American: No     Diabetic: Yes     Tobacco smoker: No     Systolic Blood Pressure: 786 mmHg     Is BP treated: Yes     HDL Cholesterol: 33 mg/dL     Total Cholesterol: 162 mg/dL    BP Readings from Last 3 Encounters:  06/26/22 (!) 162/92  03/26/22 (!) 144/80  11/15/21 (!) 142/88     Allergies  Allergen Reactions   Codeine     hallucinations   Farxiga [Dapagliflozin] Other (See Comments)    Genital infection and irritation   Statins     Has tried several cannot tolerate    Medications Reviewed Today      Reviewed by Cherre Robins, RPH-CPP (Pharmacist) on 10/11/22 at [place] List Status: <None>   Medication Order Taking? Sig Documenting Provider Last Dose Status Informant  Acetylcarnitine HCl 500 MG CAPS [id number] Yes Take 500 mg by mouth daily. [provider] Taking Active Self           Med Note Rosemarie Beath, MELISSA B   Thu Nov 21, 2017 12:03 PM)    Alcohol Swabs (B-D SINGLE USE SWABS REGULAR) PADS [id number] Yes USE PRIOR TO INSULIN DOSE Copland, Gay Filler, MD Taking Active   Alpha-Lipoic Acid 600 MG CAPS [id number] Yes Take 600 mg by mouth daily. [provider] Taking Active Self           Med Note Antony Contras, Camille Dragan B   Tue Apr 04, 2021  1:16 PM)    aspirin 81 MG chewable tablet [id number] Yes Chew 1 tablet (81 mg total) by mouth daily. Shelly Coss, MD Taking Active Self  diclofenac (VOLTAREN) 75 MG EC tablet [id number] Yes Take 1 tablet (75 mg total) by mouth 2 (two) times daily as needed (joint pain). Copland, Gay Filler, MD Taking Active   Evolocumab (REPATHA) 140 MG/ML SOSY [id number] Yes Inject 140 mg into the skin every 14 (fourteen) days. Copland, Gay Filler, MD Taking Active   famotidine (PEPCID) 20 MG tablet [id number] Yes Take 1 tablet (20 mg total) by mouth 2 (two) times daily. Copland, Gay Filler, MD Taking Active   glipiZIDE (GLUCOTROL XL) 10 MG 24 hr tablet [id number] Yes Take 1 tablet (10 mg total) by mouth daily with breakfast. Copland, Gay Filler, MD Taking Active   glucose blood (ONETOUCH VERIO) test strip [id number] Yes Use to check blood glucose twice a day Copland, Gay Filler, MD Taking Active   Lancets (ONETOUCH DELICA PLUS [id number]) MISC [id number] Yes USE TWO TIMES A DAY TO     CHECK BLOOD SUGAR Copland, Gay Filler, MD Taking Active   melatonin 5 MG TABS [id number] Yes Take 5 mg by mouth at bedtime.  [provider] Taking Active Self  metFORMIN (GLUCOPHAGE-XR) 500 MG 24 hr tablet [id number] Yes TAKE 2 TABLETS TWICE A DAY  WITH FOOD Copland, Gay Filler, MD  Taking Active   Multiple Vitamin (MULTIVITAMIN) tablet [id number] Yes Take 1 tablet by mouth daily. [provider] Taking Active Self  niacin (NIASPAN) 1000 MG CR tablet [id number] Yes Take 1,000 mg by mouth at bedtime. [provider] Taking Active            Med Note Antony Contras, Adria Dill Nov 23, 2021  2:06 PM) Patient is taking over-the-counter niacin  omeprazole (PRILOSEC) 40 MG capsule [id number] Yes Take 1 capsule (40 mg total) by  mouth daily. Copland, Gay Filler, MD Taking Active   OVER THE COUNTER MEDICATION [id number] No Take 1 tablet by mouth daily. Kenard Gower  Patient not taking: Reported on 08/01/2022   [provider] Not Taking Active   Semaglutide, 2 MG/DOSE, 8 MG/3ML SOPN [id number] Yes Inject 2 mg as directed once a week. Copland, Gay Filler, MD Taking Active            Med Note Antony Contras, Sandre Kitty   Wed Aug 01, 2022  3:22 PM) [facility]  tadalafil (CIALIS) 10 MG tablet [id number] Yes Take 1 tablet (10 mg total) by mouth daily as needed for erectile dysfunction. Max 20 mg total per day Copland, Gay Filler, MD Taking Active   tadalafil (CIALIS) 5 MG tablet [id number] Yes Take 1 tablet (5 mg total) by mouth daily. Copland, Gay Filler, MD Taking Active   valsartan (DIOVAN) 320 MG tablet [id number] Yes Take 1 tablet (320 mg total) by mouth daily. Copland, Gay Filler, MD Taking Active   vitamin C (ASCORBIC ACID) 500 MG tablet [id number] Yes Take 500 mg by mouth daily. [provider] Taking Active Self  VITAMIN D-VITAMIN K PO [id number] Yes Take 1 tablet by mouth daily. [provider] Taking Active   zinc gluconate 50 MG tablet [id number] Yes Take 50 mg by mouth daily. 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   Depression 06/25/2022   Diabetes mellitus without complication ([place]) 50/35/4656   Neuromuscular disorder ([place]) 06/25/2022   PONV (postoperative nausea and vomiting) 06/25/2022   Sleep apnea 06/25/2022   Branch retinal vein occlusion 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/25/2022   Tributary (branch) retinal vein occlusion, left eye, 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/25/2022   History of adenomatous polyp of colon 81/27/5170   Metabolic syndrome 01/74/9449   Pain in joint, pelvic region and thigh 06/25/2022   Diabetes mellitus with neuropathy ([place]) 06/25/2022   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   Malignant neoplasm of prostate ([place]) 03/25/2021   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 esophagitis 01/17/2021   Erectile dysfunction 01/17/2021   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   Paroxysmal atrial fibrillation ([place]) 12/16/2017   Retinal edema 12/13/2017   SBO (small bowel obstruction) ([place]) 10/08/2017   ATN (acute tubular necrosis) ([place]) 10/08/2017   Septic shock ([place]) 10/04/2017   Acute cholecystitis s/p perc cholecystomy drainage 10/04/2017    Acute respiratory failure with hypoxia ([facility])    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   Myopia of both eyes 03/21/2016   Nuclear sclerotic cataract of both eyes 03/21/2016   Presbyopia 03/21/2016   Regular astigmatism of both eyes 03/21/2016   Chronic back pain 02/08/2016   Controlled type 2 diabetes mellitus with diabetic neuropathy, without long-term current use of insulin ([place]) 02/08/2016   Insomnia 02/08/2016   History of diverticulitis 02/08/2016   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 Patient was approved for $2500 from  07/30/2022 thru 08/28/2023.   Assessment  / Plan Type 2 DM - not at goal of A1c < 7.0% Discussed dietary change to help with blood glucose.   Also discussed either switching to [facility] (would start with 5 or 7.'5mg'$  weekly) or adding long acting insulin. Patient would need to get [place] from [place]. Reviewed prior authorization criteria and he should be able to get prior authorization for [place] thru [place]. Patient to contact his PCP at the [facility] regarding [place].  Provided sample for Ozempic '2mg'$  since patient is due dose in 3 days and he has not received notice that [place] has shipped yet.  Recommended that he try to adjust dose of Ozempic down a little to 1.'5mg'$  weekly which is 56 clicks with the '2mg'$  pen. Provided handout about adjusting clicks to get lower dose of Ozempic.  Continue to check blood glucose daily. Reviewed goals  Hypertension: goal < 130/80 Continue valsartan to '320mg'$  daily Continue to check blood pressure daily and record.   Hyperlipidemia:  TG not at goal of < 150; LDL at goal of < 70   Reapplied and approved for assistance thru [employer] for hyperlipidemia however they are requesting proof of income before pharmacy card if activated. Patient notified and will get need information and bring to office.  Start Repatha when completes current Praluent prescription (due to change in insurance formulary in 2024). Encouraged patient to request Repatha from [place] and this will prevent him from reaching Medicare coverage gap with his [facility] plan.   GERD -  Continue omeprazole and famotidine Discussed limit foods that can worsen GERD - coffee, tomato based foods, caffeine, chocolate.   Tried to reschedule missed follow up appt with PCP. Patient would like to come in 2 weeks but has to check his wife's work schedule first.   Follow Up:  Telephone  follow up appointment with care management team member scheduled for:  4 to 6 weeks   Cherre Robins, PharmD Clinical Pharmacist [place] [phone]

## 2022-10-24 ENCOUNTER — Telehealth: Payer: Self-pay | Admitting: Pharmacist

## 2022-10-24 NOTE — Telephone Encounter (Signed)
Received patient's household income documentation. Scanned and uploaded to [facility] as requested for patient to be evaluated for 2024 Hypercholesteremia grant.

## 2022-10-26 ENCOUNTER — Other Ambulatory Visit: Payer: Self-pay | Admitting: Family Medicine

## 2022-10-26 DIAGNOSIS — M2550 Pain in unspecified joint: Secondary | ICD-10-CM

## 2022-10-26 DIAGNOSIS — K219 Gastro-esophageal reflux disease without esophagitis: Secondary | ICD-10-CM

## 2022-10-31 NOTE — Telephone Encounter (Signed)
Timothy Chapman with [name] calling to receive pt's phone number because on the form, our number was listed so they cannot confirm pt's household size. Saw note from [employer] saying that she uploaded form so provided pt number. For future references, she said to include household size when uploading household income documentation.

## 2022-11-13 NOTE — Patient Instructions (Addendum)
Good to see you again today Recommend covid booster if not done in 6 months or so  Please stop by imaging on the ground floor to have x-rays of your lumbar spine.  Then we can plan to get an MRI and refer you to Neurosurgery - if you decide to go through the [place] that is fine!

## 2022-11-13 NOTE — Progress Notes (Signed)
[place] [facility] [address] [place], [place] [address] [phone]  Date:  11/21/2022   Name:  Timothy Chapman [facility]   DOB:  1946-02-12   MRN:  [id number]  PCP:  [name], MD    Chief Complaint: Follow-up (Concerns/ questions: 1. Pt would like to discuss CGM d/t numbness in the hands. 2. Ins will not cover Repatha and Ozempic 3. Alternatives for Volteran. 4. Mail order sent him Glipizide and not the ER version. /Foot exam is due/)   History of Present Illness:  Timothy Chapman is a 77 y.o. very pleasant male patient who presents with the following: Pt seen today for a recheck visit Last seen by myself in July History of diabetes, hypertension, chronic back pain, severe illness in 2019-he became ill with sepsis due to cholangitis, had acute respiratory failure and required intubation He receives some care through the [place] History of prostate cancer currently on active surveillance  Married to [place], they have a toddler son - [name] is 2 yrs now!   Referred to urology last year for perhaps questionable history of prostate cancer, PSA recheck - he was seen by Dr Baruch Gouty with [facility] in November:  Malignant neoplasm of prostate ([place]) Yes   BPH with obstruction/lower urinary tract symptoms   Organic impotence   Hypogonadism in male  Plan: 1. Due for PSA. Continue AS of prostate cancer.  2. Discussed with patient that TRT is contraindicated in patients with untreated prostate cancer even though his disease is low risk 3. Continue tadalafil 4. Follow-up in 6 months with PSA prior   He notes worsening of chronic back pain- he has known history of spinal stenosis- getting worse again for a year or longer His son is getting older and heavier- lifting him is hard  He did have an MRI of his L spine most recently in 2017 He had lumbar surgery in 1985 and 1990; this was done by someone here in [place] but his MD is no longer  around.  Per patient this was treatment for ruptured disc as opposed to stenosis He did have some epidural steroid injections done through the [place] in 2017- helped for a short time  He is having some numbness and tinging in his legs -he will start to have pain and numbness after standing for 5 to 10 minutes No change in bowel or bladder control  Leaning on something helps him a lot   Needs urine micro today Covid booster may need update Eye exam- he is seen every 10 weeks at [facility] exam due A1c needed   He also notes lack of feeling in his left hand for the last 6 months or so- he notes it when he checks his glucose  He has been dropping things from his left hand as well He does not think the hand is weak but instead that numbness is causing difficulty with function He wonders about getting a continuous glucose monitor  Repatha Pepcid Glipizide Metformin niaspan Ozempic 2 mg Diovan  Lab Results  Component Value Date   HGBA1C 8.5 (H) 03/26/2022     Patient Active Problem List   Diagnosis Date Noted   Essential hypertension 06/26/2022   Mixed dyslipidemia 06/26/2022   Obesity (BMI 35.0-39.9 without comorbidity) 06/26/2022   Dyspnea on exertion 06/26/2022   Cardiac murmur 06/26/2022   Anxiety 06/25/2022   Arthritis 06/25/2022   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   Depression 06/25/2022   Diabetes mellitus without complication ([place]) 99991111   Neuromuscular disorder ([place]) 06/25/2022   PONV (postoperative nausea and vomiting) 06/25/2022   Sleep apnea 06/25/2022   Branch retinal vein occlusion 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/25/2022   Tributary (branch) retinal vein occlusion, left eye, 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/25/2022   History of adenomatous polyp of colon 99991111   Metabolic syndrome 99991111   Pain in joint, pelvic region and thigh 06/25/2022   Diabetes mellitus with neuropathy ([place]) 06/25/2022   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    Malignant neoplasm of prostate ([place]) 03/25/2021   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 esophagitis 01/17/2021   Erectile dysfunction 01/17/2021   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   Paroxysmal atrial fibrillation (HCC) 12/16/2017   Retinal edema 12/13/2017   SBO (small bowel obstruction) ([place]) 10/08/2017   ATN (acute tubular necrosis) ([place]) 10/08/2017   Septic shock ([place]) 10/04/2017   Acute cholecystitis s/p perc cholecystomy drainage 10/04/2017    Acute respiratory failure with hypoxia (HCC)    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   Myopia of both eyes 03/21/2016   Nuclear sclerotic cataract of both eyes 03/21/2016   Presbyopia 03/21/2016   Regular astigmatism of both eyes 03/21/2016   Chronic back pain 02/08/2016   Controlled type 2 diabetes mellitus with diabetic neuropathy, without long-term current use of insulin ([place]) 02/08/2016   Insomnia 02/08/2016   History of diverticulitis 02/08/2016   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   Elevated PSA 09/26/2015   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   Encounter for rehabilitation 04/25/2015   HYPERTENSION, BENIGN ESSENTIAL, UNCONTROLLED 03/12/2009   ABNORMAL ELECTROCARDIOGRAM 03/12/2009    Past Medical History:  Diagnosis Date   ABNORMAL ELECTROCARDIOGRAM 03/12/2009   Qualifier: Diagnosis of  By: Assunta Found MD, Stephen     Acute cholecystitis s/p perc cholecystomy drainage 10/04/2017    Acute respiratory failure with hypoxia ([place])    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   Cannot tolerate statins   Elevated PSA 09/26/2015   Encounter for rehabilitation 04/25/2015   Erectile dysfunction 01/17/2021   GERD (gastroesophageal reflux disease)    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 esophagitis 01/17/2021   History of adenomatous polyp of colon 06/25/2022   Sep 28, 2015 Entered By: Christene Lye Comment: 2000 polyp; +++; 2012 tics; *Surveillance 2017Jan 11, 2017 Entered By: Christene Lye Comment: MGM=CRC age 74   History of diverticulitis 02/08/2016   HYPERTENSION, BENIGN ESSENTIAL, UNCONTROLLED 03/12/2009   Qualifier: Diagnosis of  By: Assunta Found MD, Stephen     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   Insomnia 02/08/2016   Malignant neoplasm of prostate ([place]) Q000111Q   Metabolic syndrome [id number]   Myopia of both eyes 03/21/2016   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   Feb 19, 2020 Entered By: [id number] Comment: statin induced   Neuromuscular disorder ([place])  neuropathy but being treated   Nuclear sclerotic cataract of both eyes 03/21/2016   Pain in joint, pelvic region and thigh 06/25/2022   Paroxysmal atrial fibrillation ([place]) 12/16/2017   PONV (postoperative nausea and vomiting)    Presbyopia 03/21/2016   Regular astigmatism of both eyes 03/21/2016   Retinal edema 12/13/2017   SBO (small bowel obstruction) ([place]) 10/08/2017   Septic shock ([place]) 10/04/2017   Sleep apnea    has a sleep study but refuses that he has sleep study, not wearing CPAP   Tributary (branch) retinal vein occlusion, left eye, 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/25/2022    Past Surgical History:  Procedure Laterality Date   BACK SURGERY     X2 - lower   CHOLECYSTECTOMY N/A 12/20/2017   Procedure: LAPAROSCOPIC CHOLECYSTECTOMY WITH INTRAOPERATIVE CHOLANGIOGRAM POSSIBLE OPEN;  Surgeon: Fanny Skates, MD;  Location: [facility];  Service: General;  Laterality: N/A;   COLONOSCOPY      ESOPHAGOGASTRODUODENOSCOPY     IR RADIOLOGIST EVAL & MGMT  10/30/2017   IR RADIOLOGIST EVAL & MGMT  11/14/2017   PROSTATE SURGERY  2017   uro lift per pt.   REPLACEMENT TOTAL KNEE Left    ROTATOR CUFF REPAIR     Bil    Social History   Tobacco Use   Smoking status: Former    Types: Cigarettes    Quit date: 09/18/1975    Years since quitting: 47.2   Smokeless tobacco: Never  Vaping Use   Vaping Use: Never used  Substance Use Topics   Alcohol use: Not Currently    Alcohol/week: 0.0 standard drinks of alcohol    Comment: Rarely   Drug use: Never    Family History  Problem Relation Age of Onset   Heart failure Father    Heart disease Father    Emphysema Mother    Colon cancer Maternal Grandmother     Allergies  Allergen Reactions   Codeine     hallucinations   Farxiga [Dapagliflozin] Other (See Comments)    Genital infection and irritation   Statins     Has tried several cannot tolerate    Medication list has been reviewed and updated.  Current Outpatient Medications on File Prior to Visit  Medication Sig Dispense Refill   Acetylcarnitine HCl 500 MG CAPS Take 500 mg by mouth daily.     Alcohol Swabs (B-D SINGLE USE SWABS REGULAR) PADS USE PRIOR TO INSULIN DOSE 200 each 7   Alpha-Lipoic Acid 600 MG CAPS Take 600 mg by mouth daily.     aspirin 81 MG chewable tablet Chew 1 tablet (81 mg total) by mouth daily. 30 tablet 0   diclofenac (VOLTAREN) 75 MG EC tablet TAKE 1 TABLET TWICE A DAY  AS NEEDED FOR JOINT PAIN 180 tablet 0   Evolocumab (REPATHA) 140 MG/ML SOSY Inject 140 mg into the skin every 14 (fourteen) days. 2.1 mL 6   famotidine (PEPCID) 20 MG tablet Take 1 tablet (20 mg total) by mouth 2 (two) times daily. 180 tablet 0   glucose blood (ONETOUCH VERIO) test strip Use to check blood glucose twice a day 200 strip 1   Lancets (ONETOUCH DELICA PLUS Q000111Q) MISC USE TWO TIMES A DAY TO     CHECK BLOOD SUGAR 100 each 3   melatonin 5 MG TABS Take 5 mg by mouth at  bedtime.      metFORMIN (GLUCOPHAGE-XR) 500 MG 24 hr tablet TAKE 2 TABLETS TWICE A DAY  WITH FOOD 360 tablet 3  Multiple Vitamin (MULTIVITAMIN) tablet Take 1 tablet by mouth daily.     niacin (NIASPAN) 1000 MG CR tablet Take 1,000 mg by mouth at bedtime.     omeprazole (PRILOSEC) 40 MG capsule TAKE 1 CAPSULE DAILY 90 capsule 0   Semaglutide, 2 MG/DOSE, 8 MG/3ML SOPN Inject 2 mg as directed once a week. 3 mL 0   tadalafil (CIALIS) 10 MG tablet Take 1 tablet (10 mg total) by mouth daily as needed for erectile dysfunction. Max 20 mg total per day 90 tablet 2   tadalafil (CIALIS) 5 MG tablet Take 1 tablet (5 mg total) by mouth daily. 90 tablet 3   valsartan (DIOVAN) 320 MG tablet Take 1 tablet (320 mg total) by mouth daily. 90 tablet 1   vitamin C (ASCORBIC ACID) 500 MG tablet Take 500 mg by mouth daily.     VITAMIN D-VITAMIN K PO Take 1 tablet by mouth daily.     zinc gluconate 50 MG tablet Take 50 mg by mouth daily.     No current facility-administered medications on file prior to visit.    Review of Systems:  As per HPI- otherwise negative.  BP Readings from Last 3 Encounters:  11/21/22 (!) 150/86  06/26/22 (!) 162/92  03/26/22 (!) 144/80      Physical Examination: Vitals:   11/21/22 1031  BP: (!) 150/86  Pulse: 97  Resp: 18  Temp: 97.6 F (36.4 C)  SpO2: 97%   Vitals:   11/21/22 1031  Weight: 250 lb 3.2 oz (113.5 kg)  Height: '5\' 9"'$  (1.753 m)   Body mass index is 36.95 kg/m. Ideal Body Weight: Weight in (lb) to have BMI = 25: 168.9  GEN: no acute distress.  Obese, looks well HEENT: Atraumatic, Normocephalic.  Ears and Nose: No external deformity. CV: RRR, No M/G/R. No JVD. No thrill. No extra heart sounds. PULM: CTA B, no wheezes, crackles, rhonchi. No retractions. No resp. distress. No accessory muscle use. ABD: S, NT, ND. No rebound. No HSM. EXTR: No c/c/e PSYCH: Normally interactive. Conversant.  Thoracolumbar flexion is limited by pain.  Patient is using a  cane to ambulate Old healed surgical scar over midline lower lumbar spine Foot exam: Pulses are present, he is not able to sense monofilament locations on the right  Assessment and Plan: Dyslipidemia  HYPERTENSION, BENIGN ESSENTIAL, UNCONTROLLED - Plan: Basic metabolic panel  Controlled type 2 diabetes mellitus with diabetic neuropathy, without long-term current use of insulin ([place]) - Plan: Microalbumin / creatinine urine ratio, Hemoglobin A1c, glipiZIDE (GLUCOTROL XL) 10 MG 24 hr tablet  Iron deficiency - Plan: Ferritin  Neuropathy - Plan: Ferritin, B12  Chronic bilateral low back pain without sciatica - Plan: HYDROcodone-acetaminophen (NORCO/VICODIN) 5-325 MG tablet, DG Lumbar Spine Complete  Patient seen today for follow-up.  Blood pressure looks better on recheck, continue current medications for now Follow-up on diabetes control.  For some reason he got regular release glipizide instead of extended at his most recent refill.  Sent in a prescription for the glipizide XL today He is potentially interested in a CBG.  Will see how his A1c looks today and decide if this would be helpful He notes possible neuropathy in his left hand, also history of significant iron deficiency.  Will check iron and B12 today Chronic back pain, worse for the last year or so.  Symptoms consistent with spinal stenosis.  Will obtain plain films today, plan to proceed to MRI and neurosurgery referral.  Patient may choose to pursue  this through the [place], he will let me know what he decides He is having quite a bit of pain, I gave him 30 hydrocodone to use as needed  Signed Lamar Blinks, MD  Received x-rays and sent patient a message.  He would like to pursue lumbar MRI, then if necessary neurosurgery consultation in the pharmacy for  Also received labs as below, message to patient  Results for orders placed or performed in visit on 11/21/22  Microalbumin / creatinine urine ratio  Result Value Ref Range    Microalb, Ur 25.1 (H) 0.0 - 1.9 mg/dL   Creatinine,U 81.1 mg/dL   Microalb Creat Ratio 31.0 (H) 0.0 - 30.0 mg/g  Basic metabolic panel  Result Value Ref Range   Sodium 136 135 - 145 mEq/L   Potassium 4.3 3.5 - 5.1 mEq/L   Chloride 100 96 - 112 mEq/L   CO2 24 19 - 32 mEq/L   Glucose, Bld 208 (H) 70 - 99 mg/dL   BUN 23 6 - 23 mg/dL   Creatinine, Ser 1.13 0.40 - 1.50 mg/dL   GFR 64.12 >60.00 mL/min   Calcium 10.5 8.4 - 10.5 mg/dL  Hemoglobin A1c  Result Value Ref Range   Hgb A1c MFr Bld 10.2 (H) 4.6 - 6.5 %  Ferritin  Result Value Ref Range   Ferritin 20.3 (L) 22.0 - 322.0 ng/mL  B12  Result Value Ref Range   Vitamin B-12 1,438 (H) 211 - 911 pg/mL

## 2022-11-21 ENCOUNTER — Ambulatory Visit (HOSPITAL_BASED_OUTPATIENT_CLINIC_OR_DEPARTMENT_OTHER)
Admission: RE | Admit: 2022-11-21 | Discharge: 2022-11-21 | Disposition: A | Discharge status: Home / Self Care | Payer: Medicare HMO | Source: Ambulatory Visit | Attending: Family Medicine | Admitting: Family Medicine

## 2022-11-21 ENCOUNTER — Encounter: Payer: Self-pay | Admitting: Family Medicine

## 2022-11-21 ENCOUNTER — Other Ambulatory Visit (HOSPITAL_BASED_OUTPATIENT_CLINIC_OR_DEPARTMENT_OTHER): Payer: Self-pay

## 2022-11-21 ENCOUNTER — Ambulatory Visit (INDEPENDENT_AMBULATORY_CARE_PROVIDER_SITE_OTHER): Payer: Medicare HMO | Admitting: Family Medicine

## 2022-11-21 VITALS — BP 140/80 | HR 97 | Temp 97.6°F | Resp 18 | Ht 69.0 in | Wt 250.2 lb

## 2022-11-21 DIAGNOSIS — I10 Essential (primary) hypertension: Secondary | ICD-10-CM | POA: Diagnosis not present

## 2022-11-21 DIAGNOSIS — G8929 Other chronic pain: Secondary | ICD-10-CM

## 2022-11-21 DIAGNOSIS — M545 Low back pain, unspecified: Secondary | ICD-10-CM | POA: Diagnosis not present

## 2022-11-21 DIAGNOSIS — E1140 Type 2 diabetes mellitus with diabetic neuropathy, unspecified: Secondary | ICD-10-CM

## 2022-11-21 DIAGNOSIS — E611 Iron deficiency: Secondary | ICD-10-CM | POA: Diagnosis not present

## 2022-11-21 DIAGNOSIS — E785 Hyperlipidemia, unspecified: Secondary | ICD-10-CM | POA: Diagnosis not present

## 2022-11-21 DIAGNOSIS — G629 Polyneuropathy, unspecified: Secondary | ICD-10-CM | POA: Diagnosis not present

## 2022-11-21 DIAGNOSIS — M4807 Spinal stenosis, lumbosacral region: Secondary | ICD-10-CM

## 2022-11-21 LAB — MICROALBUMIN / CREATININE URINE RATIO
Creatinine,U: 81.1 mg/dL
Microalb Creat Ratio: 31 mg/g — ABNORMAL HIGH (ref 0.0–30.0)
Microalb, Ur: 25.1 mg/dL — ABNORMAL HIGH (ref 0.0–1.9)

## 2022-11-21 LAB — BASIC METABOLIC PANEL
BUN: 23 mg/dL (ref 6–23)
CO2: 24 mEq/L (ref 19–32)
Calcium: 10.5 mg/dL (ref 8.4–10.5)
Chloride: 100 mEq/L (ref 96–112)
Creatinine, Ser: 1.13 mg/dL (ref 0.40–1.50)
GFR: 64.12 mL/min (ref >60.00)
Glucose, Bld: 208 mg/dL — ABNORMAL HIGH (ref 70–99)
Potassium: 4.3 mEq/L (ref 3.5–5.1)
Sodium: 136 mEq/L (ref 135–145)

## 2022-11-21 LAB — VITAMIN B12: Vitamin B-12: 1438 pg/mL — ABNORMAL HIGH (ref 211–911)

## 2022-11-21 LAB — HEMOGLOBIN A1C: Hgb A1c MFr Bld: 10.2 % — ABNORMAL HIGH (ref 4.6–6.5)

## 2022-11-21 LAB — FERRITIN: Ferritin: 20.3 ng/mL — ABNORMAL LOW (ref 22.0–322.0)

## 2022-11-21 MED ORDER — HYDROCODONE-ACETAMINOPHEN 5-325 MG PO TABS
1.0000 | ORAL_TABLET | Freq: Three times a day (TID) | ORAL | 0 refills | Status: DC | PRN
  Filled 2022-11-21: qty 30, 10d supply, fill #0

## 2022-11-21 MED ORDER — GLIPIZIDE ER 10 MG PO TB24: 10.0000 mg | ORAL_TABLET | Freq: Every day | ORAL | 3 refills | Status: DC

## 2022-11-22 ENCOUNTER — Telehealth: Payer: Self-pay | Admitting: Pharmacist

## 2022-11-22 ENCOUNTER — Encounter: Payer: Self-pay | Admitting: Family Medicine

## 2022-11-22 MED ORDER — TIRZEPATIDE 5 MG/0.5ML ~~LOC~~ SOAJ: 5.0000 mg | SUBCUTANEOUS | 2 refills | Status: DC

## 2022-11-22 MED ORDER — FREESTYLE LIBRE 3 SENSOR MISC: 3 refills | Status: DC

## 2022-11-22 NOTE — Telephone Encounter (Signed)
-----   Message from [name], MD sent at 11/22/2022  1:39 PM EST ----- Thank you!   I got in touch with pt and asked if he would like me to rx [facility] 5 for him instead  What do you think about a CBG for him?  Do you think covered?  ----- Message ----- From: Cherre Robins, RPH-CPP Sent: 11/22/2022   9:39 AM EST To: [name], MD  I think Darcel Bayley would be great a next step. Since he is already on GLP1 / Ozempic '1mg'$  - I would start with Mounjaro '5mg'$  weekly.  I discussed it with him during our last phone visit and recommended that he see if he could get thru the [place]. He would meet their prior authorization criteria.   If he gets thru his Medicare Part D he will hit the coverage gap in about 2 or 3 months and the cost of Mounjaro will increase to around $300 per month. Currently Darcel Bayley is not included in the [facility] patient assistance program.  If it became difficult to get [facility] due to cost we could always change back to Ozempic and increase to '2mg'$  or add daily long acting insulin.   Let me know if you want me to call him to discuss.  Lorri Fukuhara  ----- Message ----- From: [name], MD Sent: 11/21/2022   6:05 PM EST To: Cherre Robins, RPH-CPP  Unfortunately A1c going the wrong direction. Were we thinking of getting a continuous glucose monitor for this patient?  I also wonder if switching from Ozempic to [facility] might help

## 2022-11-22 NOTE — Telephone Encounter (Signed)
Patient is having trouble checking blood glucose due to neuropathy in left hand.   Called [employer] to see if they cover either DexCom 7 or Freestyle Libre 3. Per representative they are covered but requires prior authorization. Representative was not able to provide criteria for prior authorization, so I am not sure if they require patient to be on insulin for Continuous Glucose Monitor.   Called patient to inquire about cell phone to make sure it will be compatible with either the DexCom 7 or Libre 3 sensors.  Patient has Iphone 14 max which is compatible with either DexCom 7 or Libre 3.  Patient prefers to try [place]ib[place] 3 sensors. Sent Rx to [employer] at patient request and started prior authorization process on Cover My Meds. But received message on Cover My Meds that prior authorization not required for [place] 3 sensors.  [employer] order and they ran a test claim (rep was to able to see the Rx I sent electronically yet) and cost for 84 day supply or 9 sensors was $80.34. Patient is agreeable to cost. He will come in 11/28/2022 for face to face visit for Continuous Glucose Monitor education.

## 2022-11-28 ENCOUNTER — Ambulatory Visit: Payer: Medicare HMO

## 2022-11-28 ENCOUNTER — Ambulatory Visit (INDEPENDENT_AMBULATORY_CARE_PROVIDER_SITE_OTHER): Payer: Medicare HMO | Admitting: Pharmacist

## 2022-11-28 VITALS — BP 138/88 | Wt 252.0 lb

## 2022-11-28 DIAGNOSIS — E782 Mixed hyperlipidemia: Secondary | ICD-10-CM

## 2022-11-28 DIAGNOSIS — E1140 Type 2 diabetes mellitus with diabetic neuropathy, unspecified: Secondary | ICD-10-CM

## 2022-11-28 DIAGNOSIS — I10 Essential (primary) hypertension: Secondary | ICD-10-CM

## 2022-11-28 MED ORDER — REPATHA SURECLICK 140 MG/ML ~~LOC~~ SOAJ: 140.0000 mg | SUBCUTANEOUS | 5 refills | Status: DC

## 2022-11-28 NOTE — Progress Notes (Signed)
Pharmacy Note  11/28/2022 Name: Timothy Chapman MRN: [id number] DOB: [birth year]  Subjective: Timothy Chapman is a 77 y.o. year old male who is a primary care patient of Copland, Gay Filler, MD. Clinical Pharmacist Practitioner referral was placed to assist with medication, diabetes, hypertension and hyperlipidemia management.    Engaged with patient by telephone for follow up visit today.  Type 2 DM:  A1c has increased despite current therapy of Ozempic '2mg'$  weekly, metformin ER '500mg'$  = 2 tablets twice a day and glipizide ER '10mg'$  daily.  He reports home blood glucose has been variable. Lowest in 120's and several > 200. Denies hypoglycemia Denies nausea with last Ozempic dose. Plan is to change to [facility] '5mg'$  weekly if approved by [facility] but [facility]   Hypertension: blood pressure goal < 130/90.  Current treatment: valsartan '160mg'$  daily.  Past medications: felodipine 2.'5mg'$  daily caused lethargy, amlodipine - caused dizziness Home blood pressure : 120 to 150 / 80's. Patient feels that blood pressure is good in AM but higher in PM. He asks if he could try valsartan '160mg'$  twice a day.   Hyperlipidemia: Last LDL at goal but triglycerides still elevated. Recent cornary calcium score was elevated. Saw Dr Julianne Rice who has ordered more testing.  Patient stopped Vascepa due to indigestion.  Has been taking PCSK9 agent with good results. He states today that most recent Rx for Repatha was for prefilled syringe. He is asking if he can change to autoinjector because of the size of the prefilled syringe needle.  In 2023 he was taking Praluent but is not on his [place] for 2023. Changed to [place] in 2024.   Objective: Review of patient status, including review of consultants reports, laboratory and other test data, was performed as part of comprehensive evaluation and provision of chronic care management services.   Lab Results  Component  Value Date   CREATININE 1.13 11/21/2022   CREATININE 1.07 07/10/2022   CREATININE 1.07 03/26/2022    Lab Results  Component Value Date   HGBA1C 10.2 (H) 11/21/2022       Component Value Date/Time   CHOL 162 07/10/2022 1036   TRIG 427 (H) 07/10/2022 1036   HDL 33 (L) 07/10/2022 1036   CHOLHDL 4.9 07/10/2022 1036   CHOLHDL 5 08/16/2021 1058   VLDL 57.0 (H) 07/09/2017 0826   LDLCALC 63 07/10/2022 1036   LDLCALC  07/06/2020 1448     Comment:     . LDL cholesterol not calculated. Triglyceride levels greater than 400 mg/dL invalidate calculated LDL results. . Reference range: <100 . Desirable range <100 mg/dL for primary prevention;   <70 mg/dL for patients with CHD or diabetic patients  with > or = 2 CHD risk factors. Marland Kitchen LDL-C is now calculated using the Martin-Hopkins  calculation, which is a validated novel method providing  better accuracy than the Friedewald equation in the  estimation of LDL-C.  Cresenciano Genre et al. Annamaria Helling. [id number]): 2061-2068  (http://education.QuestDiagnostics.com/faq/FAQ164)    LDLDIRECT 106.0 08/16/2021 1058     Clinical ASCVD: Yes  The 10-year ASCVD risk score (Arnett DK, et al., 2019) is: 57.3%   Values used to calculate the score:     Age: 69 years     Sex: Male     Is Non-Hispanic African American: No     Diabetic: Yes     Tobacco smoker: Yes     Systolic Blood Pressure: [id number] mmHg  Is BP treated: Yes     HDL Cholesterol: 33 mg/dL     Total Cholesterol: 162 mg/dL    BP Readings from Last 3 Encounters:  11/21/22 (!) 140/80  06/26/22 (!) 162/92  03/26/22 (!) 144/80     Allergies  Allergen Reactions   Codeine     hallucinations   Farxiga [Dapagliflozin] Other (See Comments)    Genital infection and irritation   Statins     Has tried several cannot tolerate    Medications Reviewed Today     Reviewed by Cherre Robins, RPH-CPP (Pharmacist) on 11/28/22 at 1003  Med List Status: <None>   Medication Order Taking? Sig  Documenting Provider Last Dose Status Informant  Acetylcarnitine HCl 500 MG CAPS [id number] Yes Take 500 mg by mouth daily. [provider] Taking Active Self           Med Note Rosemarie Beath, MELISSA B   Thu Nov 21, 2017 12:03 PM)    Alcohol Swabs (B-D SINGLE USE SWABS REGULAR) PADS [id number] Yes USE PRIOR TO INSULIN DOSE Copland, Gay Filler, MD Taking Active   Alpha-Lipoic Acid 600 MG CAPS [id number] Yes Take 600 mg by mouth daily. [provider] Taking Active Self           Med Note Antony Contras, Amour Trigg B   Tue Apr 04, 2021  1:16 PM)    aspirin 81 MG chewable tablet [id number] Yes Chew 1 tablet (81 mg total) by mouth daily. Shelly Coss, MD Taking Active Self  Continuous Blood Gluc Sensor (FREESTYLE LIBRE 3 SENSOR) [place] [id number] No Use to check blood glucose continuously throughout the day. Remove and place new sensor every 14 days.  Patient not taking: Reported on 11/28/2022   Copland, Gay Filler, MD Not Taking Active   diclofenac (VOLTAREN) 75 MG EC tablet [id number] Yes TAKE 1 TABLET TWICE A DAY  AS NEEDED FOR JOINT PAIN Copland, Gay Filler, MD Taking Active   Evolocumab (REPATHA) 140 MG/ML SOSY [id number] Yes Inject 140 mg into the skin every 14 (fourteen) days. Copland, Gay Filler, MD Taking Active   famotidine (PEPCID) 20 MG tablet [id number] Yes Take 1 tablet (20 mg total) by mouth 2 (two) times daily. Copland, Gay Filler, MD Taking Active   glipiZIDE (GLUCOTROL XL) 10 MG 24 hr tablet [id number] Yes Take 1 tablet (10 mg total) by mouth daily with breakfast. Copland, Gay Filler, MD Taking Active   glucose blood (ONETOUCH VERIO) test strip [id number] Yes Use to check blood glucose twice a day Copland, Gay Filler, MD Taking Active   HYDROcodone-acetaminophen (NORCO/VICODIN) 5-325 MG tablet [id number] Yes Take 1 tablet by mouth every 8 (eight) hours as needed. Copland, Gay Filler, MD Taking Active   Lancets (ONETOUCH DELICA PLUS Q000111Q) [place] [id number] Yes USE TWO TIMES A DAY TO     CHECK BLOOD  SUGAR Copland, Gay Filler, MD Taking Active   melatonin 5 MG TABS [id number] Yes Take 5 mg by mouth at bedtime.  [provider] Taking Active Self  metFORMIN (GLUCOPHAGE-XR) 500 MG 24 hr tablet [id number] Yes TAKE 2 TABLETS TWICE A DAY  WITH FOOD Copland, Gay Filler, MD Taking Active   Multiple Vitamin (MULTIVITAMIN) tablet [id number] Yes Take 1 tablet by mouth daily. [provider] Taking Active Self  niacin (NIASPAN) 1000 MG CR tablet [id number] Yes Take 1,000 mg by mouth at bedtime. [provider] Taking Active            Med Note Antony Contras, [place] B   Thu Nov 23, 2021  2:06 PM) Patient is taking over-the-counter niacin  omeprazole (PRILOSEC) 40 MG capsule [id number] Yes TAKE 1 CAPSULE DAILY Copland, Gay Filler, MD Taking Active   Semaglutide, 2 MG/DOSE, 8 MG/3ML SOPN [id number] Yes Inject 2 mg as directed once a week. Copland, Gay Filler, MD Taking Active   tadalafil (CIALIS) 10 MG tablet [id number] Yes Take 1 tablet (10 mg total) by mouth daily as needed for erectile dysfunction. Max 20 mg total per day Copland, Gay Filler, MD Taking Active   tadalafil (CIALIS) 5 MG tablet [id number] Yes Take 1 tablet (5 mg total) by mouth daily. Copland, Gay Filler, MD Taking Active   tirzepatide [facility]) 5 MG/0.5ML Pen [id number] No Inject 5 mg into the skin once a week.  Patient not taking: Reported on 11/28/2022   Copland, Gay Filler, MD Not Taking Active   valsartan (DIOVAN) 320 MG tablet [id number] Yes Take 1 tablet (320 mg total) by mouth daily. Copland, Gay Filler, MD Taking Active   vitamin C (ASCORBIC ACID) 500 MG tablet [id number] Yes Take 500 mg by mouth daily. [provider] Taking Active Self  VITAMIN D-VITAMIN K PO [id number] Yes Take 1 tablet by mouth daily. [provider] Taking Active   zinc gluconate 50 MG tablet [id number] Yes Take 50 mg by mouth daily. 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   Depression 06/25/2022   Diabetes mellitus without complication ([place]) [id number]   Neuromuscular disorder ([place]) 06/25/2022   PONV (postoperative nausea and vomiting) 06/25/2022   Sleep apnea 06/25/2022   Branch retinal vein occlusion 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/25/2022   Tributary (branch) retinal vein occlusion, left eye, 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/25/2022   History of adenomatous polyp of colon [id number]   Metabolic syndrome [id number]   Pain in joint, pelvic region and thigh 06/25/2022   Diabetes mellitus with neuropathy ([place]) 06/25/2022   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   Malignant neoplasm of prostate ([place]) 03/25/2021   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 esophagitis 01/17/2021   Erectile dysfunction 01/17/2021   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   Paroxysmal atrial fibrillation ([place]) 12/16/2017   Retinal edema 12/13/2017   SBO (small bowel obstruction) ([place]) 10/08/2017   ATN (acute tubular necrosis) ([place]) 10/08/2017   Septic shock ([place]) 10/04/2017   Acute cholecystitis s/p perc cholecystomy drainage 10/04/2017    Acute respiratory failure with hypoxia (HCC)    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   Myopia of both eyes 03/21/2016   Nuclear sclerotic cataract of both eyes 03/21/2016   Presbyopia 03/21/2016   Regular astigmatism of both eyes 03/21/2016   Chronic back pain 02/08/2016   Controlled type 2 diabetes mellitus with diabetic neuropathy, without long-term current use of insulin ([place]) 02/08/2016   Insomnia 02/08/2016   History of diverticulitis 02/08/2016   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 Patient was approved for $  2500 from  07/30/2022 thru 08/28/2023 but his approval is on hold until he provides first 2 pages of 2023 tax return.   Assessment  / Plan Type 2 DM - not at goal of A1c < 7.0% Started Freestyle Libre 3 Continuous Glucose Monitor today with sample. Patient will receive first delivery from mail order this week.  Assisted patient in downloading [place] 3 app to his phone. Assisted with set up and connected with patient on the [facility] site so that provider can follow CBG results. Provided education about how to apply Continuous Glucose Monitor, care of Continuous Glucose Monitor. Discussed trend arrows and how to interpret blood glucose readings and information provided in the [place] 3 app. Discussed when to check blood glucose with fingerstick to insure accuracy.  Discussed Mounjaro (would start with 5 or 7.'5mg'$  weekly). Rx was sent to [facility] - patient to follow up with [place] regarding next steps to get prior authorization - per criteria I was able to see on [place] website. Patient would qualify for [facility].   Patient to continue Ozempic '2mg'$  weekly until he uses up supply or until [place] is approved and received from [place].  Reviewed blood glucose goals  Hypertension: goal < 130/80 Trial of valsartan to '320mg'$  0.5 tablet = '160mg'$  twice a day to see if better blood pressure control all day.  Continue to check blood pressure daily and record.   Hyperlipidemia:  TG not at goal of < 150; LDL at goal of < [place] regarding hyperlipidemia grant - they need copy of first 2 pages of patient's 2023 tax return. Patient to bring in.  Changed Repatha to auto injector for ease of injection.  Encouraged patient to request Repatha from  [place] and this will prevent him from reaching Medicare coverage gap with his [facility] plan.     Follow Up:  Telephone follow up appointment with care management team member scheduled for:  2 weeks   Cherre Robins, PharmD Clinical Pharmacist [place] [phone]

## 2022-12-04 ENCOUNTER — Telehealth: Payer: Self-pay | Admitting: Pharmacist

## 2022-12-04 ENCOUNTER — Encounter: Payer: Self-pay | Admitting: Pharmacist

## 2022-12-04 DIAGNOSIS — K219 Gastro-esophageal reflux disease without esophagitis: Secondary | ICD-10-CM

## 2022-12-04 DIAGNOSIS — M2550 Pain in unspecified joint: Secondary | ICD-10-CM

## 2022-12-04 MED ORDER — TIRZEPATIDE 5 MG/0.5ML ~~LOC~~ SOAJ: 5.0000 mg | SUBCUTANEOUS | 0 refills | Status: DC

## 2022-12-04 NOTE — Telephone Encounter (Signed)
Patient 's blood glucose on average 220. He would like to try [facility]. Sent Rx to local pharmacy.  He will stop Ozempic while taking Mounjaro.

## 2022-12-12 ENCOUNTER — Ambulatory Visit (INDEPENDENT_AMBULATORY_CARE_PROVIDER_SITE_OTHER): Payer: Medicare HMO | Admitting: Pharmacist

## 2022-12-12 DIAGNOSIS — E1140 Type 2 diabetes mellitus with diabetic neuropathy, unspecified: Secondary | ICD-10-CM

## 2022-12-12 DIAGNOSIS — I10 Essential (primary) hypertension: Secondary | ICD-10-CM

## 2022-12-12 DIAGNOSIS — E782 Mixed hyperlipidemia: Secondary | ICD-10-CM

## 2022-12-12 NOTE — Progress Notes (Signed)
Pharmacy Note  12/12/2022 Name: Timothy Chapman MRN: [id number] DOB: October 11, 1945  Subjective: Timothy Chapman is a 77 y.o. year old male who is a primary care patient of Copland, Gay Filler, MD. Clinical Pharmacist Practitioner referral was placed to assist with medication, diabetes, hypertension and hyperlipidemia management.    Engaged with patient by telephone for follow up visit today.  Type 2 DM:   Current therapy: Mounjaro 5mg  weekly (first dose was 12/08/2022 - previously on Ozempic 2mg  weekly), metformin ER 500mg  = 2 tablets twice a day and glipizide ER 10mg  daily.  Reviwed Continuous Glucose Monitor report - see below.  Seems to be having fewer blood glucose readings > 250 since he started [facility].  Denies hypoglycemia He states the [place] continues to tell him that Darcel Bayley is not on their formulary.  Hypertension: blood pressure goal < 130/90.  Current treatment: valsartan 160mg  twice a day Past medications: felodipine 2.5mg  daily caused lethargy, amlodipine - caused dizziness Home blood pressure-  has not checked since our last visit.   Hyperlipidemia: Last LDL at goal but triglycerides still elevated. Recent cornary calcium score was elevated. Saw Dr Julianne Rice who has ordered more testing.  Patient stopped Vascepa due to indigestion.  Has been taking PCSK9 agent with good results.   In 2023 he was taking Praluent but is not on his [place] for 2023. Changed to [place] in 2024.    Continuous Glucose Monitor report:             Objective: Review of patient status, including review of consultants reports, laboratory and other test data, was performed as part of comprehensive evaluation and provision of chronic care management services.   Lab Results  Component Value Date   CREATININE 1.13 11/21/2022   CREATININE 1.07 07/10/2022   CREATININE 1.07 03/26/2022    Lab Results  Component Value Date   HGBA1C 10.2 (H) 11/21/2022       Component Value  Date/Time   CHOL 162 07/10/2022 1036   TRIG 427 (H) 07/10/2022 1036   HDL 33 (L) 07/10/2022 1036   CHOLHDL 4.9 07/10/2022 1036   CHOLHDL 5 08/16/2021 1058   VLDL 57.0 (H) 07/09/2017 0826   LDLCALC 63 07/10/2022 1036   LDLCALC  07/06/2020 1448     Comment:     . LDL cholesterol not calculated. Triglyceride levels greater than 400 mg/dL invalidate calculated LDL results. . Reference range: <100 . Desirable range <100 mg/dL for primary prevention;   <70 mg/dL for patients with CHD or diabetic patients  with > or = 2 CHD risk factors. Marland Kitchen LDL-C is now calculated using the Martin-Hopkins  calculation, which is a validated novel method providing  better accuracy than the Friedewald equation in the  estimation of LDL-C.  Cresenciano Genre et al. Annamaria Helling. [id number]): 2061-2068  (http://education.QuestDiagnostics.com/faq/FAQ164)    LDLDIRECT 106.0 08/16/2021 1058     Clinical ASCVD: Yes  The 10-year ASCVD risk score (Arnett DK, et al., 2019) is: 56.4%   Values used to calculate the score:     Age: 60 years     Sex: Male     Is Non-Hispanic African American: No     Diabetic: Yes     Tobacco smoker: Yes     Systolic Blood Pressure: 0000000 mmHg     Is BP treated: Yes     HDL Cholesterol: 33 mg/dL     Total Cholesterol: 162 mg/dL    BP Readings from Last 3 Encounters:  11/28/22 138/88  11/21/22 (!) 140/80  06/26/22 (!) 162/92     Allergies  Allergen Reactions   Codeine     hallucinations   Farxiga [Dapagliflozin] Other (See Comments)    Genital infection and irritation   Statins     Has tried several cannot tolerate    Medications Reviewed Today     Reviewed by Cherre Robins, RPH-CPP (Pharmacist) on 12/12/22 at 1556  Med List Status: <None>   Medication Order Taking? Sig Documenting Provider Last Dose Status Informant  Acetylcarnitine HCl 500 MG CAPS [id number] Yes Take 500 mg by mouth daily. [provider] Taking Active Self           Med Note Rosemarie Beath, MELISSA B    Thu Nov 21, 2017 12:03 PM)    Alcohol Swabs (B-D SINGLE USE SWABS REGULAR) PADS [id number] Yes USE PRIOR TO INSULIN DOSE Copland, Gay Filler, MD Taking Active   Alpha-Lipoic Acid 600 MG CAPS [id number] Yes Take 600 mg by mouth daily. [provider] Taking Active Self           Med Note Antony Contras, [place] Magos B   Tue Apr 04, 2021  1:16 PM)    aspirin 81 MG chewable tablet [id number] Yes Chew 1 tablet (81 mg total) by mouth daily. Shelly Coss, MD Taking Active Self  Continuous Blood Gluc Sensor (FREESTYLE LIBRE 3 SENSOR) [place] [id number] Yes Use to check blood glucose continuously throughout the day. Remove and place new sensor every 14 days. Copland, Gay Filler, MD Taking Active   diclofenac (VOLTAREN) 75 MG EC tablet [id number] Yes TAKE 1 TABLET TWICE A DAY  AS NEEDED FOR JOINT PAIN Copland, Gay Filler, MD Taking Active   Evolocumab [facility]) [id number] MG/ML SOAJ [id number] Yes Inject 140 mg into the skin every 14 (fourteen) days. Copland, Gay Filler, MD Taking Active   famotidine (PEPCID) 20 MG tablet [id number] Yes Take 1 tablet (20 mg total) by mouth 2 (two) times daily. Copland, Gay Filler, MD Taking Active   glipiZIDE (GLUCOTROL XL) 10 MG 24 hr tablet [id number] Yes Take 1 tablet (10 mg total) by mouth daily with breakfast. Copland, Gay Filler, MD Taking Active   glucose blood (ONETOUCH VERIO) test strip [id number] Yes Use to check blood glucose twice a day Copland, Gay Filler, MD Taking Active   HYDROcodone-acetaminophen (NORCO/VICODIN) 5-325 MG tablet [id number] Yes Take 1 tablet by mouth every 8 (eight) hours as needed. Copland, Gay Filler, MD Taking Active   Lancets (ONETOUCH DELICA PLUS Q000111Q) [place] [id number] Yes USE TWO TIMES A DAY TO     CHECK BLOOD SUGAR Copland, Gay Filler, MD Taking Active   melatonin 5 MG TABS [id number] Yes Take 5 mg by mouth at bedtime.  [provider] Taking Active Self  metFORMIN (GLUCOPHAGE-XR) 500 MG 24 hr tablet [id number] Yes TAKE 2 TABLETS TWICE A DAY  WITH  FOOD Copland, Gay Filler, MD Taking Active   Multiple Vitamin (MULTIVITAMIN) tablet [id number] Yes Take 1 tablet by mouth daily. [provider] Taking Active Self  niacin (NIASPAN) 1000 MG CR tablet [id number] Yes Take 1,000 mg by mouth at bedtime. [provider] Taking Active            Med Note Antony Contras, Adria Dill Nov 23, 2021  2:06 PM) Patient is taking over-the-counter niacin  omeprazole (PRILOSEC) 40 MG capsule [id number] Yes TAKE 1 CAPSULE DAILY Copland, Gay Filler, MD Taking Active   tadalafil (CIALIS) 10 MG tablet [id number] Yes Take 1 tablet (10 mg total)  by mouth daily as needed for erectile dysfunction. Max 20 mg total per day Copland, Gay Filler, MD Taking Active   tadalafil (CIALIS) 5 MG tablet [id number] Yes Take 1 tablet (5 mg total) by mouth daily. Copland, Gay Filler, MD Taking Active   tirzepatide [facility]) 5 MG/0.5ML Pen [id number] Yes Inject 5 mg into the skin once a week. Copland, Gay Filler, MD Taking Active   valsartan (DIOVAN) 320 MG tablet [id number] Yes Take 0.5 tablets (160 mg total) by mouth 2 (two) times daily. Copland, Gay Filler, MD Taking Active   vitamin C (ASCORBIC ACID) 500 MG tablet [id number] Yes Take 500 mg by mouth daily. [provider] Taking Active Self  VITAMIN D-VITAMIN K PO [id number] Yes Take 1 tablet by mouth daily. [provider] Taking Active   zinc gluconate 50 MG tablet [id number] Yes Take 50 mg by mouth daily. 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/25/2022   Tributary (branch) retinal vein occlusion, left eye, 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/25/2022   History of adenomatous polyp of colon [id number]   Metabolic syndrome 99991111   Pain in joint, pelvic region and thigh 06/25/2022   Diabetes mellitus with neuropathy ([place]) 06/25/2022   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   Malignant neoplasm of prostate ([place]) 03/25/2021   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 esophagitis 01/17/2021   Erectile dysfunction 01/17/2021   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   Paroxysmal atrial fibrillation ([place]) 12/16/2017   Retinal edema 12/13/2017   SBO (small bowel obstruction) ([place]) 10/08/2017   ATN (acute tubular necrosis) ([place]) 10/08/2017   Septic shock ([place]) 10/04/2017   Acute cholecystitis s/p perc cholecystomy drainage 10/04/2017    Acute respiratory failure with hypoxia (HCC)    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   Myopia of both eyes 03/21/2016   Nuclear sclerotic cataract of both eyes 03/21/2016   Presbyopia 03/21/2016   Regular astigmatism of both eyes 03/21/2016   Chronic back pain 02/08/2016   Controlled type 2 diabetes mellitus with diabetic neuropathy, without long-term current use of insulin ([place]) 02/08/2016   Insomnia 02/08/2016   History of diverticulitis 02/08/2016   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 Patient was approved for $2500 from  07/30/2022 thru 08/28/2023 but his approval is on hold until he provides first 2 pages of 2023 tax return. Patient supplied  needed financial information and I uploaded to [facility] portal today 12/12/2022   Assessment  / Plan Type 2 DM - not at goal of A1c < 7.0% Continue to use Freestyle Libre 3 Continuous Glucose Monitor.  Continue Mounjaro 5mg  weekly. Email patient information about prior authorization criteria  [facility]>   Continue glipizide and metformin   Hypertension: goal < 130/80 Continue valsartan to 320mg  0.5 tablet = 160mg  twice a day to see if better blood pressure control all day.  Restart checking blood pressure daily and record.   Hyperlipidemia:  TG not at goal of < 150; LDL at goal of < 70   Continue Repatha to auto injector every 14 days.  Encouraged patient to request Repatha from [place] and this will prevent him from reaching Medicare coverage gap with his [facility] plan.  uploaded to patient supplied financial information [place] portal today     Follow Up:  Telephone follow up appointment with care management team member scheduled for:  2 weeks to determine if able to titrate Mounjaro dose.    Cherre Robins, PharmD Clinical Pharmacist [place] [phone]

## 2022-12-27 ENCOUNTER — Other Ambulatory Visit: Payer: Self-pay | Admitting: Family Medicine

## 2023-01-02 ENCOUNTER — Telehealth: Payer: Self-pay | Admitting: Pharmacist

## 2023-01-02 MED ORDER — TIRZEPATIDE 10 MG/0.5ML ~~LOC~~ SOAJ: 10.0000 mg | SUBCUTANEOUS | 1 refills | Status: DC

## 2023-01-02 NOTE — Telephone Encounter (Signed)
Patient would like to increase dose of Mounjaro. He states he has tolerated the 5mg  without any problems.  See Continuous Glucose Monitor report below. Blood glucose is not at goal.   I was planning to increase to [facility] 7.5mg  weekly but that dose is not currently available. We could try 10mg  weekly. Patient is aware to monitor for GI side effects.

## 2023-01-17 ENCOUNTER — Other Ambulatory Visit: Payer: Self-pay | Admitting: Family Medicine

## 2023-01-17 DIAGNOSIS — K219 Gastro-esophageal reflux disease without esophagitis: Secondary | ICD-10-CM

## 2023-01-21 MED ORDER — DICLOFENAC SODIUM 75 MG PO TBEC: 75.0000 mg | DELAYED_RELEASE_TABLET | Freq: Two times a day (BID) | ORAL | 0 refills | Status: DC

## 2023-01-21 MED ORDER — FAMOTIDINE 20 MG PO TABS: 20.0000 mg | ORAL_TABLET | Freq: Two times a day (BID) | ORAL | 0 refills | Status: DC

## 2023-01-21 MED ORDER — METFORMIN HCL ER 500 MG PO TB24: 1000.0000 mg | ORAL_TABLET | Freq: Two times a day (BID) | ORAL | 3 refills | Status: DC

## 2023-02-05 ENCOUNTER — Other Ambulatory Visit (HOSPITAL_BASED_OUTPATIENT_CLINIC_OR_DEPARTMENT_OTHER): Payer: Self-pay

## 2023-02-05 ENCOUNTER — Telehealth: Payer: Self-pay | Admitting: Pharmacist

## 2023-02-05 MED ORDER — TIRZEPATIDE 12.5 MG/0.5ML ~~LOC~~ SOAJ
12.5000 mg | SUBCUTANEOUS | 1 refills | Status: DC
  Filled 2023-02-05: qty 2, 28d supply, fill #0
  Filled 2023-03-04: qty 2, 28d supply, fill #1

## 2023-02-05 NOTE — Telephone Encounter (Signed)
Received MyChart message from patient.  Due to increase dose of Mounjaro from 10mg  weekly to 12.5mg  weekly. Patient has tolerated 10mg  well but blood glucose is not at goal yet.  Called his usual pharmacy [facility] and they do not have any 12.5mg  Mounjaro. Sent to [facility] since they do have in stock.  LMon VM for patient regarding Mounjaro.

## 2023-02-15 ENCOUNTER — Encounter: Payer: Self-pay | Admitting: Pharmacist

## 2023-02-21 ENCOUNTER — Other Ambulatory Visit: Payer: Medicare HMO | Admitting: Pharmacist

## 2023-02-21 DIAGNOSIS — E119 Type 2 diabetes mellitus without complications: Secondary | ICD-10-CM

## 2023-02-21 NOTE — Progress Notes (Signed)
02/21/2023 Name: Timothy Chapman MRN: [id number] DOB: August 23, 1946  Chief Complaint  Patient presents with   Medication Management    Medication problem    Timothy Chapman is a 77 y.o. year old male who presented for a telephone visit.   Patient is having trouble getting Repatha from [facility] pharmacy. He states that cost was over $200. Patient should be able to have filled thru [facility] plan with remainder covered by his [employer].   It looks like [employer] [facility] has not been used yet. Called [facility] to get them to process that they state patient they cannot even fill with his [facility], that it is showing as inactive.  Patient endorses that he has an active plan with Monia Pouch and that he just recently filled Mounjaro at [facility].      Type 2 DM -  Nausea has improved after recent increase in Mounjaro to 12.5mg  weekly.  Patient reports he has been following low CHO diet and has lost weight but he is not sure how much.  Continuous Glucose Monitor report per patient shows GMI of 7.0%  Medication Access/Adherence  Current Pharmacy:  [facility] [address], [place] - [address] [place] [address] Phone: [phone] Fax: [phone]  [facility] - [place], [place] - [facility] AT Portal to Registered [address] [place] [address] Phone: [phone] Fax: [phone]  [facility] [address] [place] [address] Phone: [phone] Fax: [phone]  [facility] - [place], [place] - [address] [place] [address] Phone: [phone] Fax: [phone]   Patient reports affordability concerns with their medications: Yes  Patient reports access/transportation concerns to their  pharmacy: No  Patient reports adherence concerns with their medications:  No      Objective:  Lab Results  Component Value Date   HGBA1C 10.2 (H) 11/21/2022    Lab Results  Component Value Date   CREATININE 1.13 11/21/2022   BUN 23 11/21/2022   NA 136 11/21/2022   K 4.3 11/21/2022   CL 100 11/21/2022   CO2 24 11/21/2022    Lab Results  Component Value Date   CHOL 162 07/10/2022   HDL 33 (L) 07/10/2022   LDLCALC 63 07/10/2022   LDLDIRECT 106.0 08/16/2021   TRIG 427 (H) 07/10/2022   CHOLHDL 4.9 07/10/2022    Medications Reviewed Today     Reviewed by Henrene Pastor, RPH-CPP (Pharmacist) on 12/12/22 at 1556  Med List Status: <None>   Medication Order Taking? Sig Documenting Provider Last Dose Status Informant  Acetylcarnitine HCl 500 MG CAPS [id number] Yes Take 500 mg by mouth daily. [provider] Taking Active Self           Med Note Kai Levins, MELISSA B   Thu Nov 21, 2017 12:03 PM)    Alcohol Swabs (B-D SINGLE USE SWABS REGULAR) PADS [id number] Yes USE PRIOR TO INSULIN DOSE Copland, Gwenlyn Found, MD Taking Active   Alpha-Lipoic Acid 600 MG CAPS [id number] Yes Take 600 mg by mouth daily. [provider] Taking Active Self           Med Note Clydie Braun, Rosielee Corporan B   Tue Apr 04, 2021  1:16 PM)    aspirin 81 MG chewable tablet [id number] Yes Chew 1 tablet (81 mg total) by mouth daily.  Burnadette Pop, MD Taking Active Self  Continuous Blood Gluc Sensor (FREESTYLE LIBRE 3 SENSOR) [place] [id number] Yes Use to check blood glucose continuously throughout the day. Remove and place new sensor every 14 days. Copland, Gwenlyn Found, MD Taking Active   diclofenac (VOLTAREN) 75 MG EC tablet [id number] Yes TAKE 1 TABLET TWICE A DAY  AS NEEDED FOR JOINT PAIN Copland, Gwenlyn Found, MD Taking Active   Evolocumab [facility] SURECLICK) 140 MG/ML SOAJ [id number] Yes Inject 140 mg into the skin every 14 (fourteen) days. Copland, Gwenlyn Found, MD Taking Active   famotidine (PEPCID) 20 MG tablet [id number] Yes  Take 1 tablet (20 mg total) by mouth 2 (two) times daily. Copland, Gwenlyn Found, MD Taking Active   glipiZIDE (GLUCOTROL XL) 10 MG 24 hr tablet [id number] Yes Take 1 tablet (10 mg total) by mouth daily with breakfast. Copland, Gwenlyn Found, MD Taking Active   glucose blood (ONETOUCH VERIO) test strip [id number] Yes Use to check blood glucose twice a day Copland, Gwenlyn Found, MD Taking Active   HYDROcodone-acetaminophen (NORCO/VICODIN) 5-325 MG tablet [id number] Yes Take 1 tablet by mouth every 8 (eight) hours as needed. Copland, Gwenlyn Found, MD Taking Active   Lancets [facility] DELICA PLUS [place]) MISC [id number] Yes USE TWO TIMES A DAY TO     CHECK BLOOD SUGAR Copland, Gwenlyn Found, MD Taking Active   melatonin 5 MG TABS [id number] Yes Take 5 mg by mouth at bedtime.  [provider] Taking Active Self  metFORMIN (GLUCOPHAGE-XR) 500 MG 24 hr tablet [id number] Yes TAKE 2 TABLETS TWICE A DAY  WITH FOOD Copland, Gwenlyn Found, MD Taking Active   Multiple Vitamin (MULTIVITAMIN) tablet [id number] Yes Take 1 tablet by mouth daily. [provider] Taking Active Self  niacin (NIASPAN) 1000 MG CR tablet [id number] Yes Take 1,000 mg by mouth at bedtime. [provider] Taking Active            Med Note Clydie Braun, Cindie Laroche Nov 23, 2021  2:06 PM) Patient is taking over-the-counter niacin  omeprazole (PRILOSEC) 40 MG capsule [id number] Yes TAKE 1 CAPSULE DAILY Copland, Gwenlyn Found, MD Taking Active   tadalafil (CIALIS) 10 MG tablet [id number] Yes Take 1 tablet (10 mg total) by mouth daily as needed for erectile dysfunction. Max 20 mg total per day Copland, Gwenlyn Found, MD Taking Active   tadalafil (CIALIS) 5 MG tablet [id number] Yes Take 1 tablet (5 mg total) by mouth daily. Copland, Gwenlyn Found, MD Taking Active   tirzepatide [facility]) 5 MG/0.5ML Pen [id number] Yes Inject 5 mg into the skin once a week. Copland, Gwenlyn Found, MD Taking Active   valsartan (DIOVAN) 320 MG tablet [id number] Yes Take 0.5 tablets (160 mg  total) by mouth 2 (two) times daily. Copland, Gwenlyn Found, MD Taking Active   vitamin C (ASCORBIC ACID) 500 MG tablet [id number] Yes Take 500 mg by mouth daily. [provider] Taking Active Self  VITAMIN D-VITAMIN K PO [id number] Yes Take 1 tablet by mouth daily. [provider] Taking Active   zinc gluconate 50 MG tablet [id number] Yes Take 50 mg by mouth daily. [provider] Taking Active               Assessment/Plan:  Medication access problems Assisted patient in getting Repatha thru [facility].   Meds ordered this encounter  Medications   Evolocumab (REPATHA SURECLICK) 140 MG/ML SOAJ    Sig: Inject 140 mg into the skin every 14 (fourteen) days.  Dispense:  2 mL    Refill:  2   Type 2 DM: Last office A1c was 10.0% - 11/21/2022 per Continuous Glucose Monitor shows expected A1c around 7.0%  Continue Mounjaro 12.5mg  weekly.   Follow Up Plan: 2 to 3 months.  Reminded to make appt with PCP - A1c recheck is due. Patient states he will have to check with his wife before making appt.  Henrene Pastor, PharmD Clinical Pharmacist  Primary Care SW [facility]

## 2023-02-25 ENCOUNTER — Other Ambulatory Visit (HOSPITAL_BASED_OUTPATIENT_CLINIC_OR_DEPARTMENT_OTHER): Payer: Self-pay

## 2023-02-25 MED ORDER — REPATHA SURECLICK 140 MG/ML ~~LOC~~ SOAJ
140.0000 mg | SUBCUTANEOUS | 2 refills | Status: DC
  Filled 2023-02-25: qty 2, 28d supply, fill #0

## 2023-03-04 ENCOUNTER — Other Ambulatory Visit (HOSPITAL_BASED_OUTPATIENT_CLINIC_OR_DEPARTMENT_OTHER): Payer: Self-pay

## 2023-03-06 ENCOUNTER — Ambulatory Visit (INDEPENDENT_AMBULATORY_CARE_PROVIDER_SITE_OTHER): Payer: Medicare HMO | Admitting: *Deleted

## 2023-03-06 DIAGNOSIS — Z0000 Encounter for general adult medical examination without abnormal findings: Secondary | ICD-10-CM

## 2023-03-06 NOTE — Progress Notes (Signed)
Subjective:   Timothy Chapman is a 77 y.o. male who presents for Medicare Annual/Subsequent preventive examination.  Visit Complete: Virtual  I connected with  Timothy Chapman on 03/06/23 by a audio enabled telemedicine application and verified that I am speaking with the correct person using two identifiers.  Patient Location: Home  Provider Location: Office/Clinic  I discussed the limitations of evaluation and management by telemedicine. The patient expressed understanding and agreed to proceed.   Review of Systems     Cardiac Risk Factors include: male gender;advanced age (>55men, >22 women);diabetes mellitus;dyslipidemia;hypertension     Objective:    Today's Vitals   There is no height or weight on file to calculate BMI.     03/06/2023    1:46 PM 02/27/2022    2:22 PM 02/22/2021    1:09 PM 09/25/2019    2:06 PM 12/12/2017    1:05 PM 10/02/2017    9:37 AM  Advanced Directives  Does Patient Have a Medical Advance Directive? No No No No No No  Would patient like information on creating a medical advance directive? No - Patient declined No - Patient declined No - Patient declined No - Patient declined No - Patient declined No - Patient declined    Current Medications (verified) Outpatient Encounter Medications as of 03/06/2023  Medication Sig   Acetylcarnitine HCl 500 MG CAPS Take 500 mg by mouth daily.   Alcohol Swabs (B-D SINGLE USE SWABS REGULAR) PADS USE PRIOR TO INSULIN DOSE   Alpha-Lipoic Acid 600 MG CAPS Take 600 mg by mouth daily.   aspirin 81 MG chewable tablet Chew 1 tablet (81 mg total) by mouth daily.   Continuous Blood Gluc Sensor (FREESTYLE LIBRE 3 SENSOR) MISC Use to check blood glucose continuously throughout the day. Remove and place new sensor every 14 days.   diclofenac (VOLTAREN) 75 MG EC tablet Take 1 tablet (75 mg total) by mouth 2 (two) times daily.   Evolocumab (REPATHA SURECLICK) 140 MG/ML SOAJ Inject 140 mg into the skin every 14 (fourteen) days.    famotidine (PEPCID) 20 MG tablet Take 1 tablet (20 mg total) by mouth 2 (two) times daily.   glipiZIDE (GLUCOTROL XL) 10 MG 24 hr tablet Take 1 tablet (10 mg total) by mouth daily with breakfast.   glucose blood (ONETOUCH VERIO) test strip USE TO CHECK BLOOD GLUCOSE TWO TIMES A DAY   HYDROcodone-acetaminophen (NORCO/VICODIN) 5-325 MG tablet Take 1 tablet by mouth every 8 (eight) hours as needed.   Lancets (ONETOUCH DELICA PLUS LANCET30G) MISC USE TWO TIMES A DAY TO     CHECK BLOOD SUGAR   melatonin 5 MG TABS Take 5 mg by mouth at bedtime.    metFORMIN (GLUCOPHAGE-XR) 500 MG 24 hr tablet Take 2 tablets (1,000 mg total) by mouth 2 (two) times daily with a meal.   Multiple Vitamin (MULTIVITAMIN) tablet Take 1 tablet by mouth daily.   niacin (NIASPAN) 1000 MG CR tablet Take 1,000 mg by mouth at bedtime.   omeprazole (PRILOSEC) 40 MG capsule TAKE 1 CAPSULE DAILY.      PLEASE MAKE AN APPOINTMENT WITH YOUR DOCTOR   tadalafil (CIALIS) 10 MG tablet Take 1 tablet (10 mg total) by mouth daily as needed for erectile dysfunction. Max 20 mg total per day   tadalafil (CIALIS) 5 MG tablet Take 1 tablet (5 mg total) by mouth daily.   tirzepatide (MOUNJARO) 12.5 MG/0.5ML Pen Inject 12.5 mg into the skin once a week. (Patient not taking: Reported  on 03/06/2023)   valsartan (DIOVAN) 320 MG tablet TAKE 1 TABLET DAILY (DOSE  INCREASE)   vitamin C (ASCORBIC ACID) 500 MG tablet Take 500 mg by mouth daily.   VITAMIN D-VITAMIN K PO Take 1 tablet by mouth daily.   zinc gluconate 50 MG tablet Take 50 mg by mouth daily.   No facility-administered encounter medications on file as of 03/06/2023.    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   Cannot tolerate statins   Elevated PSA 09/26/2015   Encounter for rehabilitation 04/25/2015   Erectile dysfunction 01/17/2021   GERD (gastroesophageal reflux disease)    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 esophagitis 01/17/2021   History of adenomatous polyp of colon 06/25/2022   Sep 28, 2015 Entered By: Polly Cobia Comment: 2000 polyp; +++; 2012 tics; *Surveillance 2017Jan 11, 2017 Entered By: Polly Cobia Comment: MGM=CRC age 39   History of diverticulitis 02/08/2016   HYPERTENSION, BENIGN ESSENTIAL, UNCONTROLLED 03/12/2009   Qualifier: Diagnosis of  By: Cathren Harsh MD, Stephen     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   Insomnia 02/08/2016   Malignant neoplasm of prostate (HCC) 03/25/2021   Metabolic syndrome 06/25/2022   Myopia of both eyes 03/21/2016   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   Feb 19, 2020 Entered By: BJYNWG,NFAOZ Comment: statin induced   Neuromuscular disorder (HCC)    neuropathy but being treated   Nuclear sclerotic cataract of both eyes 03/21/2016   Pain in joint, pelvic region and thigh 06/25/2022   Paroxysmal atrial fibrillation (HCC) 12/16/2017   PONV (postoperative nausea and vomiting)    Presbyopia 03/21/2016   Regular astigmatism of both eyes 03/21/2016   Retinal edema 12/13/2017   SBO (small bowel obstruction) (HCC) 10/08/2017    Septic shock (HCC) 10/04/2017   Sleep apnea    has a sleep study but refuses that he has sleep study, not wearing CPAP   Tributary (branch) retinal vein occlusion, left eye, 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   REPLACEMENT TOTAL KNEE Left    ROTATOR  CUFF REPAIR     Bil   Family History  Problem Relation Age of Onset   Heart failure Father    Heart disease Father    Emphysema Mother    Colon cancer Maternal Grandmother    Social History   Socioeconomic History   Marital status: Married    Spouse name: Not on file   Number of children: 2   Years of education: Not on file   Highest education level: Not on file  Occupational History   Not on file  Tobacco Use   Smoking status: Former    Types: Cigarettes    Quit date: 09/18/1975    Years since quitting: 47.4   Smokeless tobacco: Never  Vaping Use   Vaping Use: Never used  Substance and Sexual Activity   Alcohol use: Not Currently    Alcohol/week: 0.0 standard drinks of alcohol    Comment: Rarely   Drug use: Never   Sexual activity: Not on file  Other Topics Concern   Not on file  Social History Narrative   Right handed   Caffeine 1-2 cups daily   Lives at home with wife and his son.   Social Determinants of Health   Financial Resource Strain: Low Risk  (02/27/2022)   Overall Financial Resource Strain (CARDIA)    Difficulty of Paying Living Expenses: Not very hard  Food Insecurity: No Food Insecurity (03/06/2023)   Hunger Vital Sign    Worried About Running Out  of Food in the Last Year: Never true    Ran Out of Food in the Last Year: Never true  Transportation Needs: No Transportation Needs (03/06/2023)   PRAPARE - Administrator, Civil Service (Medical): No    Lack of Transportation (Non-Medical): No  Physical Activity: Inactive (03/06/2023)   Exercise Vital Sign    Days of Exercise per Week: 0 days    Minutes of Exercise per Session: 0 min  Stress: No Stress Concern Present (02/27/2022)   [employer] of Occupational Health - Occupational Stress Questionnaire    Feeling of Stress : Only a little  Social Connections: Unknown (02/27/2022)   Social Connection and Isolation Panel [NHANES]    Frequency of Communication with Friends and Family: More than three times a week    Frequency of Social Gatherings with Friends and Family: More than three times a week    Attends Religious Services: Not on Marketing executive or Organizations: No    Attends Banker Meetings: Never    Marital Status: Married    Tobacco Counseling Counseling given: Not Answered   Clinical Intake:  Pre-visit preparation completed: Yes  Pain : No/denies pain  Nutritional Risks: None Diabetes: Yes CBG done?: No Did pt. bring in CBG monitor from home?: No  How often do you need to have someone help you when you read instructions, pamphlets, or other written materials from your doctor or pharmacy?: 1 - Never  Interpreter Needed?: No  Information entered by :: Donne Anon, CMA   Activities of Daily Living    03/06/2023    1:49 PM  In your present state of health, do you have any difficulty performing the following activities:  Hearing? 0  Vision? 1  Difficulty concentrating or making decisions? 0  Walking or climbing stairs? 1  Dressing or bathing? 0  Doing errands, shopping? 0  Preparing Food and eating ? N  Using the Toilet? N  In the past six  months, have you accidently leaked urine? N  Do you have problems with  loss of bowel control? N  Managing your Medications? N  Managing your Finances? N  Housekeeping or managing your Housekeeping? N    Patient Care Team: Copland, Gwenlyn Found, MD as PCP - General (Family Medicine) Henrene Pastor, RPH-CPP (Pharmacist)  Indicate any recent Medical Services you may have received from other than [facility] providers in the past year (date may be approximate).     Assessment:   This is a routine wellness examination for [facility].  Hearing/Vision screen No results found.  Dietary issues and exercise activities discussed:     Goals Addressed   None    Depression Screen    03/06/2023    1:54 PM 02/27/2022    2:20 PM 04/26/2021   11:19 AM 02/22/2021    1:11 PM 09/25/2019    2:09 PM 07/08/2017    3:12 PM 06/20/2016   11:04 AM  PHQ 2/9 Scores  PHQ - 2 Score 0 0 0 0 0 0 0  Exception Documentation      Patient refusal     Fall Risk    03/06/2023    1:48 PM 02/27/2022    2:23 PM 04/26/2021   11:19 AM 02/22/2021    1:10 PM 09/25/2019    1:55 PM  Fall Risk   Falls in the past year? 1 1 1 1 1   Number falls in past yr: 1 1  1 1   Injury with Fall? 0 1 0 0 0  Risk for fall due to : Impaired balance/gait;History of fall(s) Impaired mobility;Impaired balance/gait;Impaired vision  History of fall(s)   Follow up Falls evaluation completed Falls prevention discussed  Falls prevention discussed Education provided;Falls prevention discussed    MEDICARE RISK AT HOME:  Medicare Risk at Home - 03/06/23 1351     Any stairs in or around the home? No    If so, are there any without handrails? Yes    Home free of loose throw rugs in walkways, pet beds, electrical cords, etc? Yes    Adequate lighting in your home to reduce risk of falls? Yes    Life alert? No    Use of a cane, walker or w/c? Yes    Grab bars in the bathroom? No    Shower chair or bench in shower? No    Elevated toilet seat or a handicapped toilet? No             TIMED UP AND GO:  Was the test performed?   No    Cognitive Function:        03/06/2023    1:57 PM 02/27/2022    2:26 PM  6CIT Screen  What Year? 0 points 0 points  What month? 0 points 0 points  What time? 0 points 0 points  Count back from 20 0 points 0 points  Months in reverse 0 points 0 points  Repeat phrase 0 points 0 points  Total Score 0 points 0 points    Immunizations Immunization History  Administered Date(s) Administered   Fluad Quad(high Dose 65+) 05/28/2019, 07/06/2020   Influenza, High Dose Seasonal PF 06/20/2016, 07/08/2017   Influenza-Unspecified 08/12/2021   PFIZER Comirnaty(Gray Top)Covid-19 Tri-Sucrose Vaccine 11/15/2019, 12/15/2019, 09/02/2020   PFIZER(Purple Top)SARS-COV-2 Vaccination 11/15/2019, 12/15/2019   Pfizer Covid-19 Vaccine Bivalent Booster 84yrs & up 08/16/2021   Pneumococcal Conjugate-13 06/20/2016   Pneumococcal Polysaccharide-23 02/06/2018, 05/28/2019   Pneumococcal-Unspecified 12/20/2011, 05/18/2012   Td 11/15/2021  Tdap 09/18/2011, 10/31/2011   Zoster Recombinat (Shingrix) 02/19/2020, 05/03/2020    TDAP status: Up to date  Flu Vaccine status: Up to date  Pneumococcal vaccine status: Up to date  Covid-19 vaccine status: Information provided on how to obtain vaccines.   Qualifies for Shingles Vaccine? Yes   Zostavax completed No   Shingrix Completed?: Yes  Screening Tests Health Maintenance  Topic Date Due   COVID-19 Vaccine (7 - 2023-24 season) 05/18/2022   OPHTHALMOLOGY EXAM  06/27/2022   FOOT EXAM  08/16/2022   INFLUENZA VACCINE  04/18/2023   HEMOGLOBIN A1C  05/24/2023   Diabetic kidney evaluation - eGFR measurement  11/21/2023   Diabetic kidney evaluation - Urine ACR  11/21/2023   Medicare Annual Wellness (AWV)  03/05/2024   Colonoscopy  07/07/2028   DTaP/Tdap/Td (4 - Td or Tdap) 11/16/2031   Pneumonia Vaccine 5+ Years old  Completed   Hepatitis C Screening  Completed   Zoster Vaccines- Shingrix  Completed   HPV VACCINES  Aged Out    Health  Maintenance  Health Maintenance Due  Topic Date Due   COVID-19 Vaccine (7 - 2023-24 season) 05/18/2022   OPHTHALMOLOGY EXAM  06/27/2022   FOOT EXAM  08/16/2022    Colorectal cancer screening: Type of screening: Colonoscopy. Completed 07/07/18. Repeat every 10 years  Lung Cancer Screening: (Low Dose CT Chest recommended if Age 61-80 years, 20 pack-year currently smoking OR have quit w/in 15years.) does not qualify.   Additional Screening:  Hepatitis C Screening: does qualify; Completed 10/02/17  Vision Screening: Recommended annual ophthalmology exams for early detection of glaucoma and other disorders of the eye. Is the patient up to date with their annual eye exam?  Yes  Who is the provider or what is the name of the office in which the patient attends annual [facility] exams[facility] If pt is not established with a provider, would they like to be referred to a provider to establish care? No .   Dental Screening: Recommended annual dental exams for proper oral hygiene  Diabetic Foot Exam: Diabetic Foot Exam: Overdue, Pt has been advised about the importance in completing this exam. Pt is scheduled for diabetic foot exam on N/a.  Community Resource Referral / Chronic Care Management: CRR required this visit?  No   CCM required this visit?  No     Plan:     I have personally reviewed and noted the following in the patient's chart:   Medical and social history Use of alcohol, tobacco or illicit drugs  Current medications and supplements including opioid prescriptions. Patient is currently taking opioid prescriptions. Information provided to patient regarding non-opioid alternatives. Patient advised to discuss non-opioid treatment plan with their provider. Functional ability and status Nutritional status Physical activity Advanced directives List of other physicians Hospitalizations, surgeries, and ER visits in previous 12 months Vitals Screenings to  include cognitive, depression, and falls Referrals and appointments  In addition, I have reviewed and discussed with patient certain preventive protocols, quality metrics, and best practice recommendations. A written personalized care plan for preventive services as well as general preventive health recommendations were provided to patient.     Donne Anon, CMA   03/06/2023   After Visit Summary: (MyChart) Due to this being a telephonic visit, the after visit summary with patients personalized plan was offered to patient via MyChart   Nurse Notes: None

## 2023-03-06 NOTE — Patient Instructions (Signed)
[name] , Thank you for taking time to come for your Medicare Wellness Visit. I appreciate your ongoing commitment to your health goals. Please review the following plan we discussed and let me know if I can assist you in the future.      This is a list of the screening recommended for you and due dates:  Health Maintenance  Topic Date Due   COVID-19 Vaccine (7 - 2023-24 season) 05/18/2022   Eye exam for diabetics  06/27/2022   Complete foot exam   08/16/2022   Flu Shot  04/18/2023   Hemoglobin A1C  05/24/2023   Yearly kidney function blood test for diabetes  11/21/2023   Yearly kidney health urinalysis for diabetes  11/21/2023   Medicare Annual Wellness Visit  03/05/2024   Colon Cancer Screening  07/07/2028   DTaP/Tdap/Td vaccine (4 - Td or Tdap) 11/16/2031   Pneumonia Vaccine  Completed   Hepatitis C Screening  Completed   Zoster (Shingles) Vaccine  Completed   HPV Vaccine  Aged Out    Next appointment: Follow up in one year for your annual wellness visit.   Preventive Care 75 Years and Older, Male Preventive care refers to lifestyle choices and visits with your health care provider that can promote health and wellness. What does preventive care include? A yearly physical exam. This is also called an annual well check. Dental exams once or twice a year. Routine eye exams. Ask your health care provider how often you should have your eyes checked. Personal lifestyle choices, including: Daily care of your teeth and gums. Regular physical activity. Eating a healthy diet. Avoiding tobacco and drug use. Limiting alcohol use. Practicing safe sex. Taking low doses of aspirin every day. Taking vitamin and mineral supplements as recommended by your health care provider. What happens during an annual well check? The services and screenings done by your health care provider during your annual well check will depend on your age, overall health, lifestyle risk factors, and family  history of disease. Counseling  Your health care provider may ask you questions about your: Alcohol use. Tobacco use. Drug use. Emotional well-being. Home and relationship well-being. Sexual activity. Eating habits. History of falls. Memory and ability to understand (cognition). Work and work Astronomer. Screening  You may have the following tests or measurements: Height, weight, and BMI. Blood pressure. Lipid and cholesterol levels. These may be checked every 5 years, or more frequently if you are over 60 years old. Skin check. Lung cancer screening. You may have this screening every year starting at age 95 if you have a 30-pack-year history of smoking and currently smoke or have quit within the past 15 years. Fecal occult blood test (FOBT) of the stool. You may have this test every year starting at age 41. Flexible sigmoidoscopy or colonoscopy. You may have a sigmoidoscopy every 5 years or a colonoscopy every 10 years starting at age 84. Prostate cancer screening. Recommendations will vary depending on your family history and other risks. Hepatitis C blood test. Hepatitis B blood test. Sexually transmitted disease (STD) testing. Diabetes screening. This is done by checking your blood sugar (glucose) after you have not eaten for a while (fasting). You may have this done every 1-3 years. Abdominal aortic aneurysm (AAA) screening. You may need this if you are a current or former smoker. Osteoporosis. You may be screened starting at age 61 if you are at high risk. Talk with your health care provider about your test results, treatment options,  and if necessary, the need for more tests. Vaccines  Your health care provider may recommend certain vaccines, such as: Influenza vaccine. This is recommended every year. Tetanus, diphtheria, and acellular pertussis (Tdap, Td) vaccine. You may need a Td booster every 10 years. Zoster vaccine. You may need this after age 46. Pneumococcal  13-valent conjugate (PCV13) vaccine. One dose is recommended after age 60. Pneumococcal polysaccharide (PPSV23) vaccine. One dose is recommended after age 26. Talk to your health care provider about which screenings and vaccines you need and how often you need them. This information is not intended to replace advice given to you by your health care provider. Make sure you discuss any questions you have with your health care provider. Document Released: 09/30/2015 Document Revised: 05/23/2016 Document Reviewed: 07/05/2015 Elsevier Interactive Patient Education  2017 [place] Prevention in the Home Falls can cause injuries. They can happen to people of all ages. There are many things you can do to make your home safe and to help prevent falls. What can I do on the outside of my home? Regularly fix the edges of walkways and driveways and fix any cracks. Remove anything that might make you trip as you walk through a door, such as a raised step or threshold. Trim any bushes or trees on the path to your home. Use bright outdoor lighting. Clear any walking paths of anything that might make someone trip, such as rocks or tools. Regularly check to see if handrails are loose or broken. Make sure that both sides of any steps have handrails. Any raised decks and porches should have guardrails on the edges. Have any leaves, snow, or ice cleared regularly. Use sand or salt on walking paths during winter. Clean up any spills in your garage right away. This includes oil or grease spills. What can I do in the bathroom? Use night lights. Install grab bars by the toilet and in the tub and shower. Do not use towel bars as grab bars. Use non-skid mats or decals in the tub or shower. If you need to sit down in the shower, use a plastic, non-slip stool. Keep the floor dry. Clean up any water that spills on the floor as soon as it happens. Remove soap buildup in the tub or shower regularly. Attach  bath mats securely with double-sided non-slip rug tape. Do not have throw rugs and other things on the floor that can make you trip. What can I do in the bedroom? Use night lights. Make sure that you have a light by your bed that is easy to reach. Do not use any sheets or blankets that are too big for your bed. They should not hang down onto the floor. Have a firm chair that has side arms. You can use this for support while you get dressed. Do not have throw rugs and other things on the floor that can make you trip. What can I do in the kitchen? Clean up any spills right away. Avoid walking on wet floors. Keep items that you use a lot in easy-to-reach places. If you need to reach something above you, use a strong step stool that has a grab bar. Keep electrical cords out of the way. Do not use floor polish or wax that makes floors slippery. If you must use wax, use non-skid floor wax. Do not have throw rugs and other things on the floor that can make you trip. What can I do with my stairs? Do not leave  any items on the stairs. Make sure that there are handrails on both sides of the stairs and use them. Fix handrails that are broken or loose. Make sure that handrails are as long as the stairways. Check any carpeting to make sure that it is firmly attached to the stairs. Fix any carpet that is loose or worn. Avoid having throw rugs at the top or bottom of the stairs. If you do have throw rugs, attach them to the floor with carpet tape. Make sure that you have a light switch at the top of the stairs and the bottom of the stairs. If you do not have them, ask someone to add them for you. What else can I do to help prevent falls? Wear shoes that: Do not have high heels. Have rubber bottoms. Are comfortable and fit you well. Are closed at the toe. Do not wear sandals. If you use a stepladder: Make sure that it is fully opened. Do not climb a closed stepladder. Make sure that both sides of the  stepladder are locked into place. Ask someone to hold it for you, if possible. Clearly mark and make sure that you can see: Any grab bars or handrails. First and last steps. Where the edge of each step is. Use tools that help you move around (mobility aids) if they are needed. These include: Canes. Walkers. Scooters. Crutches. Turn on the lights when you go into a dark area. Replace any light bulbs as soon as they burn out. Set up your furniture so you have a clear path. Avoid moving your furniture around. If any of your floors are uneven, fix them. If there are any pets around you, be aware of where they are. Review your medicines with your doctor. Some medicines can make you feel dizzy. This can increase your chance of falling. Ask your doctor what other things that you can do to help prevent falls. This information is not intended to replace advice given to you by your health care provider. Make sure you discuss any questions you have with your health care provider. Document Released: 06/30/2009 Document Revised: 02/09/2016 Document Reviewed: 10/08/2014 Elsevier Interactive Patient Education  2017 [employer].

## 2023-03-11 ENCOUNTER — Other Ambulatory Visit (HOSPITAL_BASED_OUTPATIENT_CLINIC_OR_DEPARTMENT_OTHER): Payer: Self-pay

## 2023-03-11 ENCOUNTER — Ambulatory Visit: Payer: Self-pay | Admitting: Pharmacist

## 2023-03-11 DIAGNOSIS — E1140 Type 2 diabetes mellitus with diabetic neuropathy, unspecified: Secondary | ICD-10-CM

## 2023-03-11 MED ORDER — TIRZEPATIDE 12.5 MG/0.5ML ~~LOC~~ SOAJ: 12.5000 mg | SUBCUTANEOUS | 0 refills | Status: DC

## 2023-03-11 NOTE — Progress Notes (Signed)
03/11/2023 Name: Timothy Chapman MRN: [id number] DOB: 10/03/1945  Chief Complaint  Patient presents with   Medication Management    Timothy Chapman    Timothy Chapman is a 77 y.o. year old male. 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 Patient provided me with contact at [facility] to send a prescription and medical records.    Reviewed Continuous Glucose Monitor report from the last 14 days is below.  Average blood glucose has improved from 210 in March 2024 to 162 since he started [facility].  GMI has improved from 8.2% to 7.2%     Currently taking Ozmepic 2mg  weekly for the last week due to not being able to get [facility]. He is on the Medicare coverage gap and cost is > $200.  He was taking Mounjaro to 12.5mg  weekly.  Patient reports he has been following low CHO diet and has lost weight but he is not sure how much.   Medication Access/Adherence  Current Pharmacy:  [facility] [address] [place] - [address] [place] [address] Phone: [phone] Fax: [phone]  [facility] - [place], [place] - [facility] AT Portal to Registered [address] [place] [address] Phone: [phone] Fax: [phone]  [facility] [address] [place] [address] Phone: [phone] Fax: [phone]  [facility] - [place], [place] - [address] [place] [address] Phone: [phone] Fax: [phone]   Patient reports affordability concerns with their medications: Yes  Patient reports access/transportation concerns to their pharmacy: No  Patient reports adherence concerns with their medications:  No      Objective:  Lab Results  Component Value Date   HGBA1C 10.2 (H) 11/21/2022    Lab Results  Component Value Date   CREATININE 1.13  11/21/2022   BUN 23 11/21/2022   NA 136 11/21/2022   K 4.3 11/21/2022   CL 100 11/21/2022   CO2 24 11/21/2022    Lab Results  Component Value Date   CHOL 162 07/10/2022   HDL 33 (L) 07/10/2022   LDLCALC 63 07/10/2022   LDLDIRECT 106.0 08/16/2021   TRIG 427 (H) 07/10/2022   CHOLHDL 4.9 07/10/2022    Medications Reviewed Today     Reviewed by Henrene Pastor, RPH-CPP (Pharmacist) on 03/11/23 at [phone]  Med List Status: <None>   Medication Order Taking? Sig Documenting Provider Last Dose Status Informant  Acetylcarnitine HCl 500 MG CAPS [id number] Yes Take 500 mg by mouth daily. [provider] Taking Active Self           Med Note Kai Levins, MELISSA B   Thu Nov 21, 2017 12:03 PM)    Alcohol Swabs (B-D SINGLE USE SWABS REGULAR) PADS [id number] Yes USE PRIOR TO INSULIN DOSE Copland, Gwenlyn Found, MD Taking Active   Alpha-Lipoic Acid 600 MG CAPS [id number] Yes Take 600 mg by mouth daily. [provider] Taking Active Self           Med Note Clydie Braun, Uliana Brinker B   Tue Apr 04, 2021  1:16 PM)    aspirin 81 MG chewable tablet [id number] Yes Chew 1 tablet (81 mg total) by mouth daily. Burnadette Pop, MD Taking Active Self  Continuous Blood Gluc Sensor (FREESTYLE LIBRE 3 SENSOR) [place] [id number] Yes Use to check blood glucose continuously throughout the day. Remove and place new sensor every  14 days. Copland, Gwenlyn Found, MD Taking Active   diclofenac (VOLTAREN) 75 MG EC tablet [id number] Yes Take 1 tablet (75 mg total) by mouth 2 (two) times daily. Copland, Gwenlyn Found, MD Taking Active   Evolocumab [facility] SURECLICK) 140 MG/ML Ivory Broad [id number] Yes Inject 140 mg into the skin every 14 (fourteen) days. Copland, Gwenlyn Found, MD Taking Active   famotidine (PEPCID) 20 MG tablet [id number] Yes Take 1 tablet (20 mg total) by mouth 2 (two) times daily. Copland, Gwenlyn Found, MD Taking Active   glipiZIDE (GLUCOTROL XL) 10 MG 24 hr tablet [id number] Yes Take 1 tablet (10 mg total) by mouth daily with breakfast.  Copland, Gwenlyn Found, MD Taking Active   glucose blood (ONETOUCH VERIO) test strip [id number] Yes USE TO CHECK BLOOD GLUCOSE TWO TIMES A DAY Copland, Gwenlyn Found, MD Taking Active   HYDROcodone-acetaminophen (NORCO/VICODIN) 5-325 MG tablet [id number] Yes Take 1 tablet by mouth every 8 (eight) hours as needed. Copland, Gwenlyn Found, MD Taking Active   Lancets [facility] DELICA PLUS [place]) MISC [id number] Yes USE TWO TIMES A DAY TO     CHECK BLOOD SUGAR Copland, Gwenlyn Found, MD Taking Active   melatonin 5 MG TABS [id number] Yes Take 5 mg by mouth at bedtime.  [provider] Taking Active Self  metFORMIN (GLUCOPHAGE-XR) 500 MG 24 hr tablet [id number] Yes Take 2 tablets (1,000 mg total) by mouth 2 (two) times daily with a meal. Copland, Gwenlyn Found, MD Taking Active   Multiple Vitamin (MULTIVITAMIN) tablet [id number] Yes Take 1 tablet by mouth daily. [provider] Taking Active Self  niacin (NIASPAN) 1000 MG CR tablet [id number] Yes Take 1,000 mg by mouth at bedtime. [provider] Taking Active            Med Note Clydie Braun, Cindie Laroche Nov 23, 2021  2:06 PM) Patient is taking over-the-counter niacin  omeprazole (PRILOSEC) 40 MG capsule [id number] Yes TAKE 1 CAPSULE DAILY.      PLEASE MAKE AN APPOINTMENT WITH YOUR DOCTOR Copland, Gwenlyn Found, MD Taking Active   tadalafil (CIALIS) 10 MG tablet [id number] Yes Take 1 tablet (10 mg total) by mouth daily as needed for erectile dysfunction. Max 20 mg total per day Copland, Gwenlyn Found, MD Taking Active   tadalafil (CIALIS) 5 MG tablet [id number] Yes Take 1 tablet (5 mg total) by mouth daily. Copland, Gwenlyn Found, MD Taking Active   tirzepatide [facility]) 12.5 MG/0.5ML Pen [id number] No Inject 12.5 mg into the skin once a week.  Patient not taking: Reported on 03/11/2023   Copland, Gwenlyn Found, MD Not Taking Active            Med Note Marius Ditch Mar 11, 2023  9:39 AM) In coverage gap and has restarted Ozempic 2mg  that he has on hand. Trying to get  [facility] approved thru [place].   valsartan (DIOVAN) 320 MG tablet [id number] Yes TAKE 1 TABLET DAILY (DOSE  INCREASE) Copland, Gwenlyn Found, MD Taking Active   vitamin C (ASCORBIC ACID) 500 MG tablet [id number] Yes Take 500 mg by mouth daily. [provider] Taking Active Self  VITAMIN D-VITAMIN K PO [id number] Yes Take 1 tablet by mouth daily. [provider] Taking Active   zinc gluconate 50 MG tablet [id number] Yes Take 50 mg by mouth daily. [provider] Taking Active             SDOH Interventions Today    Flowsheet Row Most Recent Value  SDOH  Interventions   Financial Strain Interventions Other (Comment)  [Assited with cost of Repatha with [facility]]       Assessment/Plan:  Medication access problems Printed last PCP and Clinical Pharmacist Practitioner visits. Printed Rx and most recent Continuous Glucose Monitor report. Forwarded to PCP to review and sign. Once signed will fax to [place] to attention N.Aggie Moats, LPN and CC'd to Dr Carmon Ginsberg. Terrilee Files and A. Dulla RN. Fax # [phone]  Continue Ozempic 2mg  weekly for now but hope to change back to [facility] 12.5mg  as patient has had much better results with Mounjaro.    Follow Up Plan: 2 to 3 months.   Henrene Pastor, PharmD Clinical Pharmacist [facility]

## 2023-03-14 NOTE — Telephone Encounter (Signed)
Patient has a pending MRI order from March 6, does the pt still need to get this imaging done?

## 2023-03-14 NOTE — Telephone Encounter (Signed)
This was for pain, I'm assuming this is still warranted?

## 2023-03-15 ENCOUNTER — Encounter: Payer: Self-pay | Admitting: Family Medicine

## 2023-04-04 LAB — LIPID PANEL
Cholesterol: 97 (ref 0–200)
HDL: 25 — AB (ref 35–70)
LDL Cholesterol: 42
Triglycerides: 288 — AB (ref 40–160)

## 2023-04-04 LAB — HEMOGLOBIN A1C: Hemoglobin A1C: 6.5

## 2023-04-16 ENCOUNTER — Other Ambulatory Visit: Payer: Self-pay | Admitting: Family Medicine

## 2023-04-23 ENCOUNTER — Other Ambulatory Visit: Payer: Self-pay | Admitting: Family Medicine

## 2023-04-23 DIAGNOSIS — K219 Gastro-esophageal reflux disease without esophagitis: Secondary | ICD-10-CM

## 2023-04-23 DIAGNOSIS — M2550 Pain in unspecified joint: Secondary | ICD-10-CM

## 2023-04-27 ENCOUNTER — Other Ambulatory Visit: Payer: Self-pay | Admitting: Family Medicine

## 2023-04-29 ENCOUNTER — Ambulatory Visit: Payer: Self-pay | Admitting: Pharmacist

## 2023-04-29 DIAGNOSIS — I10 Essential (primary) hypertension: Secondary | ICD-10-CM

## 2023-04-29 DIAGNOSIS — E1140 Type 2 diabetes mellitus with diabetic neuropathy, unspecified: Secondary | ICD-10-CM

## 2023-04-29 DIAGNOSIS — E782 Mixed hyperlipidemia: Secondary | ICD-10-CM

## 2023-04-29 MED ORDER — VALSARTAN 320 MG PO TABS: 320.0000 mg | ORAL_TABLET | Freq: Every day | ORAL | 1 refills | Status: DC

## 2023-04-29 NOTE — Progress Notes (Unsigned)
05/01/2023 Name: Timothy Chapman MRN: [id number] DOB: 1945/11/27  Chief Complaint  Patient presents with   Hyperlipidemia   Diabetes   Hypertension    Timothy Chapman is a 77 y.o. year old male. [place] has been referred for Medication Assistance, DM, hypertension and hyperlipidemia.   Type 2 Diabetes:  He has restarted Mounjaro 12.5mg  weekly - getting thru [place] He is also taking metformin 1000mg  twice a day and glipizide 10mg  daily.    Reviewed Continuous Glucose Monitor report from the last 14 days is below.  Average blood glucose has improved from 210 in March 2024 to 162 since he started [facility].  GMI has improved from 7.2% to 6.8% A1c at [facility] in July was 6.5%      Patient reports he has been following low CHO / keto diet.  His weight at [place] was 234lbs - has lost about 20 lbs since 11/2022 when he started [facility].   Hyperlipidemia:  Continues to use Repatha - using [employer] [facility] for cost Labs checked at [facility] clinic 04/04/2023 Total cholesterol = 97 HDLC = 25 LDLC = 42 Triglycerides = 288  Hypertension: Taking valsartan 320mg  daily Blood pressure at [facility] 04/04/2023 was 124/82  Medication Management:  Patient report that he is taking tadalafil 5mg  daily. He will also occasionally take tadalafil 10mg  - 1 or 2 tablets as needed for prior to sexual activity. (States that he holds 5mg  dose on days that he take the as needed tadalafil dose) He reports tadalafil is working well but that he mostly take 2 tabs of tadalafil and would like to see if he could get 20mg  strength of tadalafil instead of 10mg  due to lower cost with GoodRx card.   Objective:  Lab Results  Component Value Date   HGBA1C 6.5 04/04/2023    Lab Results  Component Value Date   CREATININE 1.13 11/21/2022   BUN 23 11/21/2022   NA 136 11/21/2022   K 4.3 11/21/2022   CL 100 11/21/2022   CO2 24 11/21/2022    Lab Results  Component Value Date   CHOL 97 04/04/2023   HDL 25 (A) 04/04/2023   LDLCALC  42 04/04/2023   LDLDIRECT 106.0 08/16/2021   TRIG 288 (A) 04/04/2023   CHOLHDL 4.9 07/10/2022    Medications Reviewed Today     Reviewed by Henrene Pastor, RPH-CPP (Pharmacist) on 04/29/23 at 1354  Med List Status: <None>   Medication Order Taking? Sig Documenting Provider Last Dose Status Informant  Acetylcarnitine HCl 500 MG CAPS [id number] Yes Take 500 mg by mouth daily. [provider] Taking Active Self           Med Note Kai Levins, MELISSA B   Thu Nov 21, 2017 12:03 PM)    Alcohol Swabs (B-D SINGLE USE SWABS REGULAR) PADS [id number]  USE PRIOR TO INSULIN DOSE Copland, Gwenlyn Found, MD  Active   Alpha-Lipoic Acid 600 MG CAPS [id number] Yes Take 600 mg by mouth daily. [provider] Taking Active Self           Med Note Clydie Braun,  B   Tue Apr 04, 2021  1:16 PM)    aspirin 81 MG chewable tablet [id number] Yes Chew 1 tablet (81 mg total) by mouth daily. Burnadette Pop, MD Taking Active Self  Continuous Blood Gluc Sensor (FREESTYLE LIBRE 3 SENSOR) [place] [id number] Yes Use to check blood glucose continuously throughout the day. Remove and place new sensor every 14 days. Copland, Gwenlyn Found, MD Taking Active  diclofenac (VOLTAREN) 75 MG EC tablet [id number] Yes TAKE 1 TABLET TWICE A DAY Copland, Gwenlyn Found, MD Taking Active   Evolocumab (REPATHA SURECLICK) 140 MG/ML SOAJ [id number] Yes Inject 140 mg into the skin every 14 (fourteen) days. Copland, Gwenlyn Found, MD Taking Active   famotidine (PEPCID) 20 MG tablet [id number] Yes TAKE 1 TABLET TWICE A DAY Copland, Gwenlyn Found, MD Taking Active   glipiZIDE (GLUCOTROL XL) 10 MG 24 hr tablet [id number] Yes Take 1 tablet (10 mg total) by mouth daily with breakfast. Copland, Gwenlyn Found, MD Taking Active   glucose blood (ONETOUCH VERIO) test strip [id number] Yes USE TO CHECK BLOOD GLUCOSE TWO TIMES A DAY Copland, Gwenlyn Found, MD Taking Active   HYDROcodone-acetaminophen (NORCO/VICODIN) 5-325 MG tablet [id number]  Take 1 tablet by mouth every 8 (eight)  hours as needed. Copland, Gwenlyn Found, MD  Active   Lancets [facility] DELICA PLUS [place]) MISC [id number] Yes USE TWO TIMES A DAY TO     CHECK BLOOD SUGAR Copland, Gwenlyn Found, MD Taking Active   melatonin 5 MG TABS [id number]  Take 5 mg by mouth at bedtime.  [provider]  Active Self  metFORMIN (GLUCOPHAGE-XR) 500 MG 24 hr tablet [id number] Yes Take 2 tablets (1,000 mg total) by mouth 2 (two) times daily with a meal. Copland, Gwenlyn Found, MD Taking Active   Multiple Vitamin (MULTIVITAMIN) tablet [id number]  Take 1 tablet by mouth daily. [provider]  Active Self  niacin (NIASPAN) 1000 MG CR tablet [id number] Yes Take 1,000 mg by mouth at bedtime. [provider] Taking Active            Med Note Clydie Braun, Cindie Laroche Nov 23, 2021  2:06 PM) Patient is taking over-the-counter niacin  omeprazole (PRILOSEC) 40 MG capsule [id number] Yes TAKE 1 CAPSULE DAILY.      PLEASE MAKE AN APPOINTMENT WITH YOUR DOCTOR Copland, Gwenlyn Found, MD Taking Active   tadalafil (CIALIS) 10 MG tablet [id number]  Take 1 tablet (10 mg total) by mouth daily as needed for erectile dysfunction. Max 20 mg total per day  Patient taking differently: Take 10-20 mg by mouth daily as needed for erectile dysfunction. Max 20 mg total per day   Copland, Gwenlyn Found, MD  Active   tadalafil (CIALIS) 5 MG tablet [id number] Yes Take 1 tablet (5 mg total) by mouth daily. Copland, Gwenlyn Found, MD Taking Active   tirzepatide [facility]) 12.5 MG/0.5ML Pen [id number] Yes Inject 12.5 mg into the skin once a week. Copland, Gwenlyn Found, MD Taking Active            Med Note Clydie Braun, [place] B   Mon Apr 29, 2023  1:40 PM) Getting from [place]  valsartan (DIOVAN) 320 MG tablet [id number] Yes Take 1 tablet (320 mg total) by mouth daily. Copland, Gwenlyn Found, MD Taking Active   vitamin C (ASCORBIC ACID) 500 MG tablet [id number] Yes Take 500 mg by mouth daily. [provider] Taking Active Self  VITAMIN D-VITAMIN K PO [id number] Yes Take 1 tablet by  mouth daily. [provider] Taking Active   zinc gluconate 50 MG tablet [id number] Yes Take 50 mg by mouth daily. [provider] Taking Active                Assessment/Plan:  Type 2 DM - blood glucose improved with change back from Ozemepic to [facility]. Patient is now able to get [facility] thru [place] clinic.  Continue current medications for diabetes - Mounjaro 12.5mg   weekly, metformin and glipizide.  Continue to use Continuous Glucose Monitor to check blood glucose 3 to 4 times per day Continues low CHO / keto diet.  Discussed increase in physical activity. Patient to try to start using stationary bike more often.   Hypertension - last blood pressure at [facility] was at goal.  Continue valsartan 320mg  daily   Hyperlipidemia - LDL at goal; Tg were elevated but improved; HDLC still below goal of > 40.  Continue Repatha  Increase in exercise / weight loss should help with HDLC and triglycerides.   Medication Management:  Will check with PCP about changing prescription for tadalafil 10mg  - 1 or 2 tabs as needed to 20mg  1 tab as needed.  Updated valsartan Rx.   Meds ordered this encounter  Medications   valsartan (DIOVAN) 320 MG tablet    Sig: Take 1 tablet (320 mg total) by mouth daily.    Dispense:  90 tablet    Refill:  1      Follow Up Plan: 2 to 3 months.   Henrene Pastor, PharmD Clinical Pharmacist [facility]

## 2023-05-01 ENCOUNTER — Other Ambulatory Visit: Payer: Self-pay | Admitting: Pharmacist

## 2023-05-01 ENCOUNTER — Encounter: Payer: Self-pay | Admitting: Family Medicine

## 2023-05-01 DIAGNOSIS — N529 Male erectile dysfunction, unspecified: Secondary | ICD-10-CM

## 2023-05-01 MED ORDER — TADALAFIL 20 MG PO TABS
20.0000 mg | ORAL_TABLET | Freq: Every day | ORAL | 2 refills | Status: DC | PRN
Start: 2023-05-01 — End: 2024-03-05

## 2023-05-01 NOTE — Telephone Encounter (Signed)
Patient is taking tadalafil 5mg  daily.  He will also occasionally take tadalafil 10mg  - 1 or 2 tablets as needed (States that he holds 5mg  dose on days that he takes the as needed tadalafil dose)  He reports tadalafil is working well but that he mostly takes 2 tabs of tadalafil and would like to see if he could get 20mg  strength of tadalafil instead of 10mg  due to lower cost with GoodRx card.   Checking with PCP about Rx change from 10 to 20mg  tadalafil.

## 2023-07-16 ENCOUNTER — Other Ambulatory Visit: Payer: Self-pay | Admitting: Family Medicine

## 2023-07-16 DIAGNOSIS — K219 Gastro-esophageal reflux disease without esophagitis: Secondary | ICD-10-CM

## 2023-07-16 DIAGNOSIS — M2550 Pain in unspecified joint: Secondary | ICD-10-CM

## 2023-07-17 ENCOUNTER — Telehealth: Payer: Self-pay | Admitting: Family Medicine

## 2023-07-17 DIAGNOSIS — E1140 Type 2 diabetes mellitus with diabetic neuropathy, unspecified: Secondary | ICD-10-CM

## 2023-07-17 NOTE — Telephone Encounter (Signed)
Byrd Hesselbach from [employer] called and stated that they currently do not have Libre 3. They are requesting for a new prescription with a substitution. Please call and advise.  Tele #: [phone], option2.  Reference #: [id number]

## 2023-07-17 NOTE — Telephone Encounter (Signed)
Can you help me with this? What other continuous glucometer do you recommend?

## 2023-07-18 ENCOUNTER — Encounter: Payer: Self-pay | Admitting: Family Medicine

## 2023-07-18 ENCOUNTER — Other Ambulatory Visit: Payer: Medicare HMO | Admitting: Pharmacist

## 2023-07-18 MED ORDER — REPATHA SURECLICK 140 MG/ML ~~LOC~~ SOAJ: 140.0000 mg | SUBCUTANEOUS | 2 refills | Status: DC

## 2023-07-18 MED ORDER — GLIPIZIDE ER 10 MG PO TB24: 10.0000 mg | ORAL_TABLET | Freq: Every day | ORAL | 0 refills | Status: DC

## 2023-07-18 MED ORDER — FREESTYLE LIBRE 3 PLUS SENSOR MISC: 1 refills | Status: DC

## 2023-07-18 NOTE — Addendum Note (Signed)
Addended by: Henrene Pastor B on: 07/18/2023 01:53 PM   Modules accepted: Orders

## 2023-07-18 NOTE — Telephone Encounter (Signed)
Changed to [place] 3 plus - Rx sent to [facility].  Notified patient. He also asked for updated Rx for glipizide and Repatha. Prescriptions sent.

## 2023-07-18 NOTE — Progress Notes (Signed)
07/18/2023 Name: Timothy Chapman MRN: [id number] DOB: 01/26/1946  Chief Complaint  Patient presents with   Diabetes   Medication Management   Hypertension   Hyperlipidemia    Timothy Chapman is a 77 y.o. year old male. [facility] has been referred for Medication Assistance, DM, hypertension and hyperlipidemia.   Type 2 Diabetes:  Current Therapy: Mounjaro 12.5mg  weekly - getting thru [place]; Metformin 1000mg  twice a day and glipizide 10mg  daily - [facility]   Using [place] 3 Continuous Glucose Monitor sensors but [facility] states that are not available.  Patient also mentions that he feels sometimes the [place] 3 sensors are not accurate.   Reviewed Continuous Glucose Monitor report from the last 14 days is below.  Average blood glucose has improved from 210 in March 2024 to 162 since he started [facility].  GMI has improved from 7.2% to 6.8% A1c at [facility] in July was 6.5%  CGM Documentation:  Days Worn: 14 (recommend 14 days) % Time CGM is active: 89% (goal >=70%) Average Glucose: 160 mg/dL Glucose Management Indicator: 7.1% Glucose Variability: 15.9% (goal <36%) Time in Range:  - Time above range >250: 0% (typical goal: <5%) - Time above range 181-250: 17% (typical goal <20%) - Time in range 70-180 83% (typical goal >=70%) - Time below range 54-69: 0% (typical goal <4%) - Time below range: 0 (typical goal <1%)  Blood glucose trends: patient's blood glucose has been very stable over the last 2 weeks. Few readings that are >180 after meals. Likely related to amount of CHO in meals or snacks.         Patient reports he has been following low CHO / keto diet most days but occasionally slips. Exercise: has increased to 4 days per week - walking, elliptical or recumbent bike.    Weight at home today was 232 lbs- has lost about 20 lbs since 11/2022 when he started [facility]. Starting weight was 252 lbs Starting BMI = 37.2 Current BMI = 34.3  Wt Readings from Last 3 Encounters:  11/28/22  252 lb (114.3 kg)  11/21/22 250 lb 3.2 oz (113.5 kg)  06/26/22 247 lb 0.6 oz (112.1 kg)    Hyperlipidemia:  Continues to use Repatha - using [employer] for cost - grant good thru 08/28/2023 Labs checked at [facility] clinic 04/04/2023 Total cholesterol = 97 HDLC = 25 LDLC = 42 Triglycerides = 288  Hypertension: Current therapy: valsartan 320mg  daily  Home blood pressure 130 - 140 / 80's  Blood pressure at [facility] 04/04/2023 was 124/82  BP Readings from Last 3 Encounters:  11/28/22 138/88  11/21/22 (!) 140/80  06/26/22 (!) 162/92     Medication Management:  Patient is requesting refills fro glipizide and Repatha  Objective:  Lab Results  Component Value Date   HGBA1C 6.5 04/04/2023    Lab Results  Component Value Date   CREATININE 1.13 11/21/2022   BUN 23 11/21/2022   NA 136 11/21/2022   K 4.3 11/21/2022   CL 100 11/21/2022   CO2 24 11/21/2022    Lab Results  Component Value Date   CHOL 97 04/04/2023   HDL 25 (A) 04/04/2023   LDLCALC 42 04/04/2023   LDLDIRECT 106.0 08/16/2021   TRIG 288 (A) 04/04/2023   CHOLHDL 4.9 07/10/2022    Medications Reviewed Today   Medications were not reviewed in this encounter        Assessment/Plan:  Type 2 DM - blood glucose improving and he is experiencing weight loss due  to [facility], dietary changes and increase in exercise.  Continue current medications for diabetes - Mounjaro 12.5mg  weekly, metformin and glipizide. Discussed increasing Mounjaro to 15mg  weekly but patient wants to stick with 12.5mg  at least one more month.  Continue to use Continuous Glucose Monitor to check blood glucose 4 or more times per day - switching to [place] 3 plus sensors - Rx was sent to [facility].  Reminded patient with Libre 3+ max vitamin C per day is 1000mg /day and for Libre 2 and 3 is 500mg  per day. He will double check the amount of vitamin C in his multivitamin and if > 500mg  per dose he will stop over-the-counter vitamin C 500mg   tbas Continues low CHO / keto diet.  Continue physical activity with goal of at least 150 mins per week.   Hypertension - last blood pressure at [facility] was at goal.  Continue valsartan 320mg  daily   Hyperlipidemia - LDL at goal; Tg were elevated but improved; HDLC still below goal of > 40.  Continue Repatha every 14 days - plan to meet with pt in November to reapply fro [facility] Increase in exercise / weight loss should help with HDLC and triglycerides.   Medication Management:  Updated prescriptions for Repatha at [facility] and glipizide at [facility]  No orders of the defined types were placed in this encounter.   Follow Up Plan:November 2024 to reapply for grant; will check with PCP about recommended date for follow up   Henrene Pastor, PharmD Clinical Pharmacist [facility] Primary [facility] [place] [facility]

## 2023-07-18 NOTE — Addendum Note (Signed)
Addended by: Henrene Pastor B on: 07/18/2023 01:17 PM   Modules accepted: Orders

## 2023-07-22 ENCOUNTER — Telehealth: Payer: Medicare HMO

## 2023-07-23 ENCOUNTER — Encounter: Payer: Self-pay | Admitting: Pharmacist

## 2023-07-23 DIAGNOSIS — Z008 Encounter for other general examination: Secondary | ICD-10-CM | POA: Diagnosis not present

## 2023-07-23 LAB — HM DIABETES EYE EXAM

## 2023-07-31 ENCOUNTER — Ambulatory Visit: Payer: Medicare HMO

## 2023-07-31 ENCOUNTER — Ambulatory Visit (INDEPENDENT_AMBULATORY_CARE_PROVIDER_SITE_OTHER): Payer: Medicare HMO

## 2023-07-31 ENCOUNTER — Other Ambulatory Visit (HOSPITAL_BASED_OUTPATIENT_CLINIC_OR_DEPARTMENT_OTHER): Payer: Self-pay

## 2023-07-31 DIAGNOSIS — Z23 Encounter for immunization: Secondary | ICD-10-CM

## 2023-07-31 MED ORDER — COVID-19 MRNA VAC-TRIS(PFIZER) 30 MCG/0.3ML IM SUSY
0.3000 mL | PREFILLED_SYRINGE | Freq: Once | INTRAMUSCULAR | 0 refills | Status: AC
Start: 2023-07-31 — End: 2023-08-01
  Filled 2023-07-31: qty 0.3, 1d supply, fill #0

## 2023-08-07 ENCOUNTER — Encounter: Payer: Self-pay | Admitting: Pharmacist

## 2023-08-07 ENCOUNTER — Ambulatory Visit: Payer: Medicare HMO | Admitting: Pharmacist

## 2023-08-07 DIAGNOSIS — I10 Essential (primary) hypertension: Secondary | ICD-10-CM

## 2023-08-07 DIAGNOSIS — G720 Drug-induced myopathy: Secondary | ICD-10-CM

## 2023-08-07 DIAGNOSIS — E1140 Type 2 diabetes mellitus with diabetic neuropathy, unspecified: Secondary | ICD-10-CM

## 2023-08-07 DIAGNOSIS — Z7985 Long-term (current) use of injectable non-insulin antidiabetic drugs: Secondary | ICD-10-CM

## 2023-08-07 DIAGNOSIS — E782 Mixed hyperlipidemia: Secondary | ICD-10-CM

## 2023-08-07 DIAGNOSIS — Z7984 Long term (current) use of oral hypoglycemic drugs: Secondary | ICD-10-CM

## 2023-08-07 NOTE — Progress Notes (Signed)
08/07/2023 Name: Timothy Chapman MRN: [id number] DOB: 1946/03/03  Chief Complaint  Patient presents with   Medication Management   Diabetes   Hyperlipidemia   Hypertension    BENZINO BUZZETTA is a 77 y.o. year old male. [facility] has been referred for Medication Assistance, DM, hypertension and hyperlipidemia.   Type 2 Diabetes:  Current Therapy: Mounjaro 12.5mg  weekly - getting thru [place]; Metformin 1000mg  twice a day and glipizide 10mg  daily    Using Libre 3+ Continuous Glucose Monitor sensors now.   Reviewed Continuous Glucose Monitor report from the last 14 days is below.  Average blood glucose has improved from 210 in March 2024 to 165 since he started [facility].  GMI is 7.3% A1c at [facility] in July was 6.5%  CGM Documentation:  Days Worn: 14 (recommend 14 days) % Time CGM is active: 98% (goal >=70%) Average Glucose: 165 mg/dL Glucose Management Indicator: 7.3% Glucose Variability: 18.1% (goal <36%) Time in Range:  - Time above range >250: 0% (typical goal: <5%) - Time above range 181-250: 27% (typical goal <20%) - Time in range 70-180: 73%% (typical goal >=70%) - Time below range 54-69: 0% (typical goal <4%) - Time below range: 0 (typical goal <1%)  Blood glucose trends: patient's blood glucose has been very stable over the last 2 weeks. Few readings that are >180 after meals. Likely related to amount of CHO in meals or snacks.        Patient reports he has been following low CHO / keto diet most days but occasionally slips. His higher blood glucose numbers are usually when he is eating out. Trying to help with family grocery shopping for low glycemic foods Exercise: usually 4 days per week - walking, elliptical or recumbent bike.    Weight at home today was 232 lbs- has lost about 20 lbs since 11/2022 when he started [facility]. Starting weight was 252 lbs Starting BMI = 37.2 Current BMI = 34.3  Wt Readings from Last 3 Encounters:  11/28/22 252 lb (114.3 kg)  11/21/22  250 lb 3.2 oz (113.5 kg)  06/26/22 247 lb 0.6 oz (112.1 kg)    Hyperlipidemia:  Continues to use Repatha - using [employer] for cost - grant good thru 08/28/2023 - due to renew Labs checked at [facility] clinic 04/04/2023 Total cholesterol = 97 HDLC = 25 LDLC = 42 Triglycerides = 288  Hypertension: Current therapy: valsartan 320mg  daily  Home blood pressure 130 to 140 / 80's   Was 175/80 at dentist last week - was check with a wrist cuff.  Blood pressure at [facility] 04/04/2023 was 124/82  BP Readings from Last 3 Encounters:  11/28/22 138/88  11/21/22 (!) 140/80  06/26/22 (!) 162/92    Objective:  Lab Results  Component Value Date   HGBA1C 6.5 04/04/2023    Lab Results  Component Value Date   CREATININE 1.13 11/21/2022   BUN 23 11/21/2022   NA 136 11/21/2022   K 4.3 11/21/2022   CL 100 11/21/2022   CO2 24 11/21/2022    Lab Results  Component Value Date   CHOL 97 04/04/2023   HDL 25 (A) 04/04/2023   LDLCALC 42 04/04/2023   LDLDIRECT 106.0 08/16/2021   TRIG 288 (A) 04/04/2023   CHOLHDL 4.9 07/10/2022    Medications Reviewed Today     Reviewed by Henrene Pastor, RPH-CPP (Pharmacist) on 08/07/23 at 1446  Med List Status: <None>   Medication Order Taking? Sig Documenting Provider Last Dose Status Informant  Acetylcarnitine  HCl 500 MG CAPS [id number] No Take 500 mg by mouth daily. [provider] Taking Active Self           Med Note Kai Levins, MELISSA B   Thu Nov 21, 2017 12:03 PM)    Alcohol Swabs (B-D SINGLE USE SWABS REGULAR) PADS [id number] No USE PRIOR TO INSULIN DOSE Copland, Gwenlyn Found, MD Taking Active   Alpha-Lipoic Acid 600 MG CAPS [id number] No Take 600 mg by mouth daily. [provider] Taking Active Self           Med Note Clydie Braun, Anoop Hemmer B   Tue Apr 04, 2021  1:16 PM)    aspirin 81 MG chewable tablet [id number] No Chew 1 tablet (81 mg total) by mouth daily. Burnadette Pop, MD Taking Active Self  Continuous Glucose Sensor (FREESTYLE LIBRE 3  PLUS SENSOR) [place] [id number]  Use to check blood glucose continuously. Change sensor every 15 days. Copland, Gwenlyn Found, MD  Active   diclofenac (VOLTAREN) 75 MG EC tablet [id number] No TAKE 1 TABLET TWICE A DAY Copland, Gwenlyn Found, MD Taking Active   Evolocumab (REPATHA SURECLICK) 140 MG/ML SOAJ [id number]  Inject 140 mg into the skin every 14 (fourteen) days. Copland, Gwenlyn Found, MD  Active   famotidine (PEPCID) 20 MG tablet [id number] No Take 1 tablet (20 mg total) by mouth 2 (two) times daily. Copland, Gwenlyn Found, MD Taking Active   glipiZIDE (GLUCOTROL XL) 10 MG 24 hr tablet [id number]  Take 1 tablet (10 mg total) by mouth daily with breakfast. Copland, Gwenlyn Found, MD  Active   glucose blood (ONETOUCH VERIO) test strip [id number] No USE TO CHECK BLOOD GLUCOSE TWO TIMES A DAY Copland, Gwenlyn Found, MD Taking Active   HYDROcodone-acetaminophen (NORCO/VICODIN) 5-325 MG tablet [id number] No Take 1 tablet by mouth every 8 (eight) hours as needed.  Patient not taking: Reported on 07/18/2023   Copland, Gwenlyn Found, MD Not Taking Active   Lancets [facility] DELICA PLUS [place]) MISC [id number] No USE TWO TIMES A DAY TO     CHECK BLOOD SUGAR Copland, Gwenlyn Found, MD Taking Active   melatonin 5 MG TABS [id number] No Take 5 mg by mouth at bedtime.  [provider] Taking Active Self  metFORMIN (GLUCOPHAGE-XR) 500 MG 24 hr tablet [id number] No Take 2 tablets (1,000 mg total) by mouth 2 (two) times daily with a meal. Copland, Gwenlyn Found, MD Taking Active   Multiple Vitamin (MULTIVITAMIN) tablet [id number] No Take 1 tablet by mouth daily. [provider] Taking Active Self  niacin (NIASPAN) 1000 MG CR tablet [id number] No Take 1,000 mg by mouth at bedtime. [provider] Taking Active            Med Note Clydie Braun, Cindie Laroche Nov 23, 2021  2:06 PM) Patient is taking over-the-counter niacin  omeprazole (PRILOSEC) 40 MG capsule [id number] No Take 1 capsule (40 mg total) by mouth daily.  Patient taking  differently: Take 40 mg by mouth every other day.   Copland, Gwenlyn Found, MD Taking Active   tadalafil (CIALIS) 20 MG tablet [id number] No Take 1 tablet (20 mg total) by mouth daily as needed for erectile dysfunction. Max 20 mg total per day Copland, Gwenlyn Found, MD Taking Active   tadalafil (CIALIS) 5 MG tablet [id number] No Take 1 tablet (5 mg total) by mouth daily. Copland, Gwenlyn Found, MD Taking Active   tirzepatide [facility]) 12.5 MG/0.5ML Pen [id number] No Inject 12.5 mg into the skin once a week. Copland, Shanda Bumps  C, MD Taking Active            Med Note Clydie Braun, Royann Wildasin B   Mon Apr 29, 2023  1:40 PM) Getting from [place]  valsartan (DIOVAN) 320 MG tablet [id number] No Take 1 tablet (320 mg total) by mouth daily. Copland, Gwenlyn Found, MD Taking Active   vitamin C (ASCORBIC ACID) 500 MG tablet [id number] No Take 500 mg by mouth daily. [provider] Taking Active Self  VITAMIN D-VITAMIN K PO [id number] No Take 1 tablet by mouth daily. [provider] Taking Active   zinc gluconate 50 MG tablet [id number] No Take 50 mg by mouth daily. [provider] Taking Active                Assessment/Plan:  Type 2 DM - blood glucose improving and he is experiencing weight loss due to [facility], dietary changes and increase in exercise.  Continue current medications for diabetes - Mounjaro 12.5mg  weekly, metformin and glipizide. Discussed increasing Mounjaro to 15mg  weekly but patient wants to stick with 12.5mg  for now since he just  Continue to use Continuous Glucose Monitor to check blood glucose 4 or more times per day  Reminded patient with Libre 3+ max vitamin C per day is 1000mg /day and for Libre 2 and 3 is 500mg  per day. He will double check the amount of vitamin Continue low CHO / keto diet.  Continue physical activity with goal of at least 150 mins per week.   Hypertension - last blood pressure was at goal.  Continue valsartan 320mg  daily   Hyperlipidemia - LDL at goal; Tg were  elevated but improved; HDLC still below goal of > 40.  Continue Repatha every 14 days - re-enrolled in [facility] - good thru 08/28/2024 Increase in exercise / weight loss should help with HDLC and triglycerides.   Reminded to make follow up with PCP - patient states he couldn't make appt today. He will need to check with his wife about scheduling   Follow Up Plan:January 2025.    Henrene Pastor, PharmD Clinical Pharmacist [facility] SW [facility]

## 2023-08-15 ENCOUNTER — Other Ambulatory Visit: Payer: Self-pay | Admitting: Family Medicine

## 2023-09-25 ENCOUNTER — Ambulatory Visit (INDEPENDENT_AMBULATORY_CARE_PROVIDER_SITE_OTHER): Payer: Medicare HMO | Admitting: Pharmacist

## 2023-09-25 VITALS — Wt 232.0 lb

## 2023-09-25 DIAGNOSIS — E119 Type 2 diabetes mellitus without complications: Secondary | ICD-10-CM

## 2023-09-25 DIAGNOSIS — Z7985 Type 2 diabetes mellitus without complications: Secondary | ICD-10-CM

## 2023-09-25 DIAGNOSIS — E782 Mixed hyperlipidemia: Secondary | ICD-10-CM

## 2023-09-25 DIAGNOSIS — I10 Essential (primary) hypertension: Secondary | ICD-10-CM

## 2023-09-25 DIAGNOSIS — E1140 Type 2 diabetes mellitus with diabetic neuropathy, unspecified: Secondary | ICD-10-CM

## 2023-09-25 NOTE — Progress Notes (Signed)
 09/25/2023 Name: Timothy Chapman MRN: [id number] DOB: 1946-04-20  Chief Complaint  Patient presents with   Diabetes   Hypertension   Hyperlipidemia    Timothy Chapman is a 78 y.o. year old male. [place] has been referred for Medication Assistance, DM, hypertension and hyperlipidemia.   Type 2 Diabetes:  Current Therapy: Mounjaro  12.5mg  weekly - getting thru [place]; Metformin  1000mg  twice a day and glipizide  10mg  daily    Using Libre 3+ Continuous Glucose Monitor sensors now. (Due to taking 500mg  - 1000mg  daily of vitamin C)  Reviewed Continuous Glucose Monitor report from the last 14 days is below.  Average blood glucose has improved from 210 in March 2024 to 159 since he started Mounjaro .  GMI is 7.1% A1c at [facility] in July was 6.5%  CGM Documentation - dates 09/12/2023 to 09/25/2023 Days Worn: 14 (recommend 14 days) % Time CGM is active: 98% (goal >=70%) Average Glucose: 159 mg/dL Glucose Management Indicator: 7.1% Glucose Variability: 20.3% (goal <36%) Time in Range:  - Time above range >250: 1% (typical goal: <5%) - Time above range 181-250: 18% (typical goal <20%) - Time in range 70-180: 81%% (typical goal >=70%) - Time below range 54-69: 0% (typical goal <4%) - Time below range: 0 (typical goal <1%)  Blood glucose trends: patient's blood glucose has been good most days in the last 2 weeks. He does still have be post prandial exclusions about 4 to 5 times per week. Likely related to amount of CHO in meals or snacks.      Patient reports he has been following low CHO / keto diet most days but occasionally slips. His higher blood glucose numbers are usually when he is eating out. Trying to help with family grocery shopping for low glycemic foods Exercise: usually 4 days per week - walking, elliptical or recumbent bike.    Weight at home today was 232 lbs- has lost about 20 lbs since 11/2022 when he started Mounjaro . Starting weight was 252 lbs Starting BMI = 37.2 Current BMI  = 34.3  Wt Readings from Last 3 Encounters:  09/25/23 232 lb (105.2 kg)  11/28/22 252 lb (114.3 kg)  11/21/22 250 lb 3.2 oz (113.5 kg)    Hyperlipidemia:  Continues to use Repatha  - using [employer] for cost - grant good thru 08/27/2024 - due to renew Labs checked at [facility] clinic 04/04/2023 Total cholesterol = 97 HDLC = 25 LDLC = 42 Triglycerides = 288  Hypertension: Current therapy: valsartan  320mg  daily  Home blood pressure - has not been checking blood pressure at home recently  Was 175/80 at dentist last week - was check with a wrist cuff.  Blood pressure at [facility] 04/04/2023 was 124/82  BP Readings from Last 3 Encounters:  11/28/22 138/88  11/21/22 (!) 140/80  06/26/22 (!) 162/92    Objective:  Lab Results  Component Value Date   HGBA1C 6.5 04/04/2023    Lab Results  Component Value Date   CREATININE 1.13 11/21/2022   BUN 23 11/21/2022   NA 136 11/21/2022   K 4.3 11/21/2022   CL 100 11/21/2022   CO2 24 11/21/2022    Lab Results  Component Value Date   CHOL 97 04/04/2023   HDL 25 (A) 04/04/2023   LDLCALC 42 04/04/2023   LDLDIRECT 106.0 08/16/2021   TRIG 288 (A) 04/04/2023   CHOLHDL 4.9 07/10/2022    Medications Reviewed Today   Medications were not reviewed in this encounter  Assessment/Plan:  Type 2 DM - blood glucose improving and he is experiencing weight loss from Mounjaro , dietary changes and increase in exercise.  Continue current medications for diabetes - Mounjaro  12.5mg  weekly, metformin  and glipizide . Discussed increasing Mounjaro  to 15mg  weekly but patient wants to stick with 12.5mg  for now since he would require a new Rx to take to the [place].  Continue to use Continuous Glucose Monitor to check blood glucose 4 or more times per day; Will continue to use Libre 3+ sensors.  Continue low CHO / keto diet.  Continue physical activity with goal of at least 150 mins per week.   Hypertension - last blood pressure was at goal.   Continue valsartan  320mg  daily   Hyperlipidemia - LDL at goal; Tg were elevated but improved; HDLC still below goal of > 40.  Continue Repatha  every 14 days - re-enrolled in [employer] - good thru 08/28/2024 Increase in exercise / weight loss should help with HDLC and triglycerides.   Reminded to make follow up with PCP - patient again states he couldn't make f/u appt today. He will need to check with his wife about scheduling   Follow Up Plan: 2 to 3 months  Madelin Ray, PharmD Clinical Pharmacist [facility] Primary Care SW [facility]

## 2023-10-10 ENCOUNTER — Other Ambulatory Visit: Payer: Self-pay | Admitting: Family Medicine

## 2023-10-10 DIAGNOSIS — E1140 Type 2 diabetes mellitus with diabetic neuropathy, unspecified: Secondary | ICD-10-CM

## 2023-10-10 DIAGNOSIS — K219 Gastro-esophageal reflux disease without esophagitis: Secondary | ICD-10-CM

## 2023-10-10 DIAGNOSIS — M2550 Pain in unspecified joint: Secondary | ICD-10-CM

## 2023-10-10 MED ORDER — GLIPIZIDE ER 10 MG PO TB24: 10.0000 mg | ORAL_TABLET | Freq: Every day | ORAL | 0 refills | Status: DC

## 2023-10-10 MED ORDER — OMEPRAZOLE 40 MG PO CPDR: 40.0000 mg | DELAYED_RELEASE_CAPSULE | Freq: Every day | ORAL | 0 refills | Status: DC

## 2023-10-10 MED ORDER — VALSARTAN 320 MG PO TABS: 320.0000 mg | ORAL_TABLET | Freq: Every day | ORAL | 0 refills | Status: DC

## 2023-10-10 MED ORDER — FAMOTIDINE 20 MG PO TABS: 20.0000 mg | ORAL_TABLET | Freq: Two times a day (BID) | ORAL | 0 refills | Status: DC

## 2023-10-10 MED ORDER — DICLOFENAC SODIUM 75 MG PO TBEC: 75.0000 mg | DELAYED_RELEASE_TABLET | Freq: Two times a day (BID) | ORAL | 0 refills | Status: DC

## 2023-10-17 ENCOUNTER — Other Ambulatory Visit: Payer: Self-pay | Admitting: Family Medicine

## 2023-10-19 NOTE — Progress Notes (Signed)
 [facility] [address] [place], [place] [address] [phone]  Date:  10/24/2023   Name:  Timothy Chapman   DOB:  [birth year]   MRN:  [id number]  PCP:  Watt Harlene BROCKS, MD    Chief Complaint: Follow-up (Concerns/ questions: Fall on Tuesday night. He asks about a refill on the Norco. L arm is hurting, and hips are sore. LILLIS due/Foot exam is due)   History of Present Illness:  Timothy Chapman is a 78 y.o. very pleasant male patient who presents with the following:  Patient seen today for follow-up and lab work Most recent visit with myself was March 2024 History of diabetes, hypertension, chronic back pain, severe illness in 2019-he became ill with sepsis due to cholangitis, had acute respiratory failure and required intubation He receives some care through the [place] History of prostate cancer currently on active surveillance   Married to [name], they have a toddler son - Aloha is 3 yrs now! He is doing great but has a lot of energy, [name] admits it is challenging raising a young child in his 11s  At visit last year [name] noted some worsening of his chronic back pain.  Known history of spinal stenosis and lumbar surgery in 1985 and 1990 for ruptured disc  He continues to follow-up regularly with his ophthalmologist and is also seeing our pharmacist [name] for help with diabetes management-patient saw her most recently in January.  Using freestyle libre, taking Mounjaro  12.5, metformin , glipizide .  Lost about 20 pounds with GLP-1  Foot exam due Hemoglobin A1c Colon cancer screening up-to-date Immunizations otherwise up-to-date-May need to COVID booster  ??  Does not need PSA level- pt notes not done in a while, I can do for him today.  [facility] -he would like to get back in with Dr Shona here at [facility]   He is using a CBG- he notes his alarm went off regarding blood  sugar twice last night, said his glucose was 65- however when he checked with his manual his glucose was in the 120s He is not sure he wants to keep using the CBG  Today is Thursday- Tuesday he got tangled up getting in bed and fell - he does not think he got significanly injured and states he does not need me to check on any injuries for him today   Wt Readings from Last 3 Encounters:  10/24/23 239 lb (108.4 kg)  09/25/23 232 lb (105.2 kg)  11/28/22 252 lb (114.3 kg)   Lab Results  Component Value Date   PSA 8.02 (H) 07/06/2020   PSA 6.75 (H) 05/28/2019   PSA 6.57 (H) 07/09/2017      Lab Results  Component Value Date   HGBA1C 6.5 04/04/2023    Patient Active Problem List   Diagnosis Date Noted   Essential hypertension 06/26/2022   Mixed dyslipidemia 06/26/2022   Obesity (BMI 35.0-39.9 without comorbidity) 06/26/2022   Dyspnea on exertion 06/26/2022   Cardiac murmur 06/26/2022   Anxiety 06/25/2022   Arthritis 06/25/2022   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   Depression 06/25/2022   Diabetes mellitus treated with injections of non-insulin  medication (HCC) 06/25/2022   Neuromuscular disorder (HCC) 06/25/2022   PONV (postoperative nausea and vomiting) 06/25/2022   Sleep apnea 06/25/2022   Branch retinal vein occlusion 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/25/2022   Tributary (branch) retinal vein occlusion, left eye,  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/25/2022   History of adenomatous polyp of colon 06/25/2022   Metabolic syndrome 06/25/2022   Pain in joint, pelvic region and thigh 06/25/2022   Diabetes mellitus with neuropathy (HCC) 06/25/2022   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   Malignant neoplasm of prostate (HCC) 03/25/2021   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 esophagitis 01/17/2021   Erectile dysfunction 01/17/2021   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   Paroxysmal atrial fibrillation (HCC) 12/16/2017   Retinal edema 12/13/2017   SBO (small bowel obstruction) (HCC) 10/08/2017   ATN (acute tubular  necrosis) (HCC) 10/08/2017   Septic shock (HCC) 10/04/2017   Acute cholecystitis s/p perc cholecystomy drainage 10/04/2017    Acute respiratory failure with hypoxia (HCC)    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   Myopia of both eyes 03/21/2016   Nuclear sclerotic cataract of both eyes 03/21/2016   Presbyopia 03/21/2016   Regular astigmatism of both eyes 03/21/2016   Chronic back pain 02/08/2016   Controlled type 2 diabetes mellitus with diabetic neuropathy, without long-term current use of insulin  (HCC) 02/08/2016   Insomnia 02/08/2016   History of diverticulitis 02/08/2016   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   Elevated PSA 09/26/2015   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   Encounter for rehabilitation 04/25/2015   ABNORMAL ELECTROCARDIOGRAM 03/12/2009    Past Medical History:  Diagnosis Date   ABNORMAL ELECTROCARDIOGRAM 03/12/2009   Qualifier: Diagnosis of  By: Pauline MD, Garnette     Acute cholecystitis s/p perc cholecystomy drainage 10/04/2017    Acute respiratory failure with hypoxia (HCC)    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   Cannot tolerate statins   Elevated PSA 09/26/2015   Encounter for rehabilitation 04/25/2015   Erectile dysfunction 01/17/2021   GERD (gastroesophageal reflux disease)    GERD  without esophagitis 01/17/2021   History of adenomatous polyp of colon 06/25/2022   Sep 28, 2015 Entered By: WELBY AURELIANO HERO Comment: 2000 polyp; +++; 2012 tics; *Surveillance 2017Jan 11, 2017 Entered By: WELBY AURELIANO HERO Comment: MGM=CRC age 83   History of diverticulitis 02/08/2016   HYPERTENSION, BENIGN ESSENTIAL, UNCONTROLLED 03/12/2009   Qualifier: Diagnosis of  By: Pauline MD, Stephen     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   Insomnia 02/08/2016   Malignant neoplasm of prostate (HCC) 03/25/2021   Metabolic syndrome 06/25/2022   Myopia of both eyes 03/21/2016   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   Feb 19, 2020 Entered By: [email or web address] Comment: statin induced   Neuromuscular disorder (HCC)    neuropathy but being treated   Nuclear sclerotic cataract of both eyes 03/21/2016   Pain in joint, pelvic region and thigh 06/25/2022   Paroxysmal atrial fibrillation (HCC) 12/16/2017   PONV (postoperative nausea and vomiting)    Presbyopia 03/21/2016   Regular astigmatism of both eyes 03/21/2016   Retinal edema 12/13/2017  SBO (small bowel obstruction) (HCC) 10/08/2017   Septic shock (HCC) 10/04/2017   Sleep apnea    has a sleep study but refuses that he has sleep study, not wearing CPAP   Tributary (branch) retinal vein occlusion, left eye, 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/25/2022    Past Surgical History:  Procedure Laterality Date   BACK SURGERY     X2 - lower   CHOLECYSTECTOMY N/A 12/20/2017   Procedure: LAPAROSCOPIC CHOLECYSTECTOMY WITH INTRAOPERATIVE CHOLANGIOGRAM POSSIBLE OPEN;  Surgeon: Gail Favorite, MD;  Location: [facility] OR;  Service: General;  Laterality: N/A;   COLONOSCOPY     ESOPHAGOGASTRODUODENOSCOPY     IR RADIOLOGIST EVAL & MGMT  10/30/2017   IR RADIOLOGIST EVAL & MGMT  11/14/2017   PROSTATE SURGERY  2017   uro lift per pt.   REPLACEMENT TOTAL KNEE Left    ROTATOR CUFF REPAIR     Bil    Social History   Tobacco Use   Smoking status: Former    Current packs/day: 0.00    Types: Cigarettes    Quit date: 09/18/1975    Years  since quitting: 48.1   Smokeless tobacco: Never  Vaping Use   Vaping status: Never Used  Substance Use Topics   Alcohol  use: Not Currently    Alcohol /week: 0.0 standard drinks of alcohol     Comment: Rarely   Drug use: Never    Family History  Problem Relation Age of Onset   Heart failure Father    Heart disease Father    Emphysema Mother    Colon cancer Maternal Grandmother     Allergies  Allergen Reactions   Codeine     hallucinations   Farxiga  [Dapagliflozin ] Other (See Comments)    Genital infection and irritation   Statins     Has tried several cannot tolerate    Medication list has been reviewed and updated.  Current Outpatient Medications on File Prior to Visit  Medication Sig Dispense Refill   Acetylcarnitine HCl 500 MG CAPS Take 500 mg by mouth daily.     Alcohol  Swabs  (B-D SINGLE USE SWABS  REGULAR) PADS USE PRIOR TO INSULIN  DOSE 200 each 7   Alpha-Lipoic Acid 600 MG CAPS Take 600 mg by mouth daily.     aspirin  81 MG chewable tablet Chew 1 tablet (81 mg total) by mouth daily. 30 tablet 0   Continuous Glucose Sensor (FREESTYLE LIBRE 3 PLUS SENSOR) MISC Use to check blood glucose continuously. Change sensor every 15 days. 6 each 1   diclofenac  (VOLTAREN ) 75 MG EC tablet Take 1 tablet (75 mg total) by mouth 2 (two) times daily. 180 tablet 0   Evolocumab  (REPATHA  SURECLICK) 140 MG/ML SOAJ Inject 140 mg into the skin every 14 (fourteen) days. 2 mL 2   famotidine  (PEPCID ) 20 MG tablet Take 1 tablet (20 mg total) by mouth 2 (two) times daily. 180 tablet 0   glipiZIDE  (GLUCOTROL  XL) 10 MG 24 hr tablet Take 1 tablet (10 mg total) by mouth daily with breakfast. 90 tablet 0   HYDROcodone -acetaminophen  (NORCO/VICODIN) 5-325 MG tablet Take 1 tablet by mouth every 8 (eight) hours as needed. 30 tablet 0   Lancets (ONETOUCH DELICA PLUS LANCET30G) MISC USE TWO TIMES A DAY TO     CHECK BLOOD SUGAR 100 each 3   melatonin 5 MG TABS Take 5 mg by mouth at bedtime.      metFORMIN   (GLUCOPHAGE -XR) 500 MG 24 hr tablet Take 2 tablets (1,000 mg total) by mouth 2 (two) times  daily with a meal. 360 tablet 3   Multiple Vitamin (MULTIVITAMIN) tablet Take 1 tablet by mouth daily.     niacin  (NIASPAN ) 1000 MG CR tablet Take 1,000 mg by mouth at bedtime.     omeprazole  (PRILOSEC) 40 MG capsule Take 1 capsule (40 mg total) by mouth daily. 90 capsule 0   ONETOUCH VERIO test strip USE TO CHECK BLOOD GLUCOSE TWO TIMES A DAY 200 strip 1   tadalafil  (CIALIS ) 20 MG tablet Take 1 tablet (20 mg total) by mouth daily as needed for erectile dysfunction. Max 20 mg total per day 30 tablet 2   tadalafil  (CIALIS ) 5 MG tablet Take 1 tablet (5 mg total) by mouth daily. 90 tablet 3   tirzepatide  (MOUNJARO ) 12.5 MG/0.5ML Pen Inject 12.5 mg into the skin once a week. 6 mL 0   valsartan  (DIOVAN ) 320 MG tablet Take 1 tablet (320 mg total) by mouth daily. 90 tablet 0   VITAMIN D -VITAMIN K PO Take 1 tablet by mouth daily.     zinc gluconate 50 MG tablet Take 50 mg by mouth daily.     No current facility-administered medications on file prior to visit.    Review of Systems:  As per HPI- otherwise negative.   Physical Examination: Vitals:   10/24/23 0828  BP: (!) 142/80  Pulse: 88  Resp: 18  Temp: 97.8 F (36.6 C)  SpO2: 97%   Vitals:   10/24/23 0828  Weight: 239 lb (108.4 kg)  Height: 5' 9 (1.753 m)   Body mass index is 35.29 kg/m. Ideal Body Weight: Weight in (lb) to have BMI = 25: 168.9  GEN: no acute distress.  Obese, looks well  HEENT: Atraumatic, Normocephalic.  Ears and Nose: No external deformity. CV: RRR, No M/G/R. No JVD. No thrill. No extra heart sounds. PULM: CTA B, no wheezes, crackles, rhonchi. No retractions. No resp. distress. No accessory muscle use. ABD: S, NT, ND, +BS. No rebound. No HSM. Ventral hernia- stable  EXTR: No c/c/e PSYCH: Normally interactive. Conversant.  Foot exam- normal today   Assessment and Plan: Controlled type 2 diabetes mellitus with  diabetic neuropathy, without long-term current use of insulin  (HCC) - Plan: Comprehensive metabolic panel, Hemoglobin A1c  HYPERTENSION, BENIGN ESSENTIAL, UNCONTROLLED - Plan: CBC, Comprehensive metabolic panel  Mixed dyslipidemia - Plan: Lipid panel  History of prostate cancer - Plan: PSA, Ambulatory referral to Urology  Erectile dysfunction, unspecified erectile dysfunction type - Plan: tadalafil  (CIALIS ) 10 MG tablet  Benign prostatic hyperplasia with lower urinary tract symptoms, symptom details unspecified - Plan: tadalafil  (CIALIS ) 5 MG tablet  Patient seen today for follow-up.  Diabetes, he is currently using Mounjaro  12.5, metformin , glipizide .  Let him stop glipizide  due to concern of possible low blood sugars.  Check A1c and get back with him Blood pressure is minimally elevated today, continue current regimen Follow-up on lipids Will check PSA, Tom would like to see urology here at the med center and I made referral He notes he takes Cialis  5 mg daily, he likes to use 2 of the 10 mg tablets for total of 24 intercourse.  He is aware the max daily dosage is 20 mg Signed Harlene Schroeder, MD  Received labs as below, message to patient  Results for orders placed or performed in visit on 10/24/23  CBC   Collection Time: 10/24/23  9:06 AM  Result Value Ref Range   WBC 6.7 4.0 - 10.5 K/uL   RBC 4.87 4.22 - 5.81 Mil/uL  Platelets 255.0 150.0 - 400.0 K/uL   Hemoglobin 14.4 13.0 - 17.0 g/dL   HCT 56.8 60.9 - 47.9 %   MCV 88.5 78.0 - 100.0 fl   MCHC 33.3 30.0 - 36.0 g/dL   RDW 85.9 88.4 - 84.4 %  Comprehensive metabolic panel   Collection Time: 10/24/23  9:06 AM  Result Value Ref Range   Sodium 139 135 - 145 mEq/L   Potassium 4.3 3.5 - 5.1 mEq/L   Chloride 106 96 - 112 mEq/L   CO2 23 19 - 32 mEq/L   Glucose, Bld 165 (H) 70 - 99 mg/dL   BUN 25 (H) 6 - 23 mg/dL   Creatinine, Ser 8.69 0.40 - 1.50 mg/dL   Total Bilirubin 0.4 0.2 - 1.2 mg/dL   Alkaline Phosphatase 45 39 - 117  U/L   AST 28 0 - 37 U/L   ALT 36 0 - 53 U/L   Total Protein 7.7 6.0 - 8.3 g/dL   Albumin 4.6 3.5 - 5.2 g/dL   GFR 46.15 (L) >39.99 mL/min   Calcium 10.0 8.4 - 10.5 mg/dL  Hemoglobin J8r   Collection Time: 10/24/23  9:06 AM  Result Value Ref Range   Hgb A1c MFr Bld 7.0 (H) 4.6 - 6.5 %  Lipid panel   Collection Time: 10/24/23  9:06 AM  Result Value Ref Range   Cholesterol 111 0 - 200 mg/dL   Triglycerides 679.9 (H) 0.0 - 149.0 mg/dL   HDL 65.19 (L) >60.99 mg/dL   VLDL 35.9 (H) 0.0 - 59.9 mg/dL   LDL Cholesterol 12 0 - 99 mg/dL   Total CHOL/HDL Ratio 3    NonHDL 75.93   PSA   Collection Time: 10/24/23  9:06 AM  Result Value Ref Range   PSA 10.48 (H) 0.10 - 4.00 ng/mL

## 2023-10-19 NOTE — Patient Instructions (Addendum)
 It was good to see you again today, I will be in touch with your blood work Recommend COVID booster if none the last 6 months or so  Let's stop using glipizide - I think it may be dropping your blood sugars too low

## 2023-10-24 ENCOUNTER — Ambulatory Visit (INDEPENDENT_AMBULATORY_CARE_PROVIDER_SITE_OTHER): Payer: Medicare HMO | Admitting: Family Medicine

## 2023-10-24 ENCOUNTER — Encounter: Payer: Self-pay | Admitting: Family Medicine

## 2023-10-24 VITALS — BP 138/83 | HR 88 | Temp 97.8°F | Resp 18 | Ht 69.0 in | Wt 239.0 lb

## 2023-10-24 DIAGNOSIS — Z7984 Long term (current) use of oral hypoglycemic drugs: Secondary | ICD-10-CM

## 2023-10-24 DIAGNOSIS — Z8546 Personal history of malignant neoplasm of prostate: Secondary | ICD-10-CM

## 2023-10-24 DIAGNOSIS — E1140 Type 2 diabetes mellitus with diabetic neuropathy, unspecified: Secondary | ICD-10-CM | POA: Diagnosis not present

## 2023-10-24 DIAGNOSIS — N401 Enlarged prostate with lower urinary tract symptoms: Secondary | ICD-10-CM | POA: Diagnosis not present

## 2023-10-24 DIAGNOSIS — N529 Male erectile dysfunction, unspecified: Secondary | ICD-10-CM | POA: Diagnosis not present

## 2023-10-24 DIAGNOSIS — E782 Mixed hyperlipidemia: Secondary | ICD-10-CM

## 2023-10-24 DIAGNOSIS — Z7985 Long-term (current) use of injectable non-insulin antidiabetic drugs: Secondary | ICD-10-CM | POA: Diagnosis not present

## 2023-10-24 DIAGNOSIS — I10 Essential (primary) hypertension: Secondary | ICD-10-CM

## 2023-10-24 LAB — COMPREHENSIVE METABOLIC PANEL
ALT: 36 U/L (ref 0–53)
AST: 28 U/L (ref 0–37)
Albumin: 4.6 g/dL (ref 3.5–5.2)
Alkaline Phosphatase: 45 U/L (ref 39–117)
BUN: 25 mg/dL — ABNORMAL HIGH (ref 6–23)
CO2: 23 meq/L (ref 19–32)
Calcium: 10 mg/dL (ref 8.4–10.5)
Chloride: 106 meq/L (ref 96–112)
Creatinine, Ser: 1.3 mg/dL (ref 0.40–1.50)
GFR: 53.84 mL/min — ABNORMAL LOW (ref >60.00)
Glucose, Bld: 165 mg/dL — ABNORMAL HIGH (ref 70–99)
Potassium: 4.3 meq/L (ref 3.5–5.1)
Sodium: 139 meq/L (ref 135–145)
Total Bilirubin: 0.4 mg/dL (ref 0.2–1.2)
Total Protein: 7.7 g/dL (ref 6.0–8.3)

## 2023-10-24 LAB — CBC
HCT: 43.1 % (ref 39.0–52.0)
Hemoglobin: 14.4 g/dL (ref 13.0–17.0)
MCHC: 33.3 g/dL (ref 30.0–36.0)
MCV: 88.5 fL (ref 78.0–100.0)
Platelets: 255 10*3/uL (ref 150.0–400.0)
RBC: 4.87 Mil/uL (ref 4.22–5.81)
RDW: 14 % (ref 11.5–15.5)
WBC: 6.7 10*3/uL (ref 4.0–10.5)

## 2023-10-24 LAB — LIPID PANEL
Cholesterol: 111 mg/dL (ref 0–200)
HDL: 34.8 mg/dL — ABNORMAL LOW (ref >39.00)
LDL Cholesterol: 12 mg/dL (ref 0–99)
NonHDL: 75.93
Total CHOL/HDL Ratio: 3
Triglycerides: 320 mg/dL — ABNORMAL HIGH (ref 0.0–149.0)
VLDL: 64 mg/dL — ABNORMAL HIGH (ref 0.0–40.0)

## 2023-10-24 LAB — PSA: PSA: 10.48 ng/mL — ABNORMAL HIGH (ref 0.10–4.00)

## 2023-10-24 LAB — HEMOGLOBIN A1C: Hgb A1c MFr Bld: 7 % — ABNORMAL HIGH (ref 4.6–6.5)

## 2023-10-24 MED ORDER — TADALAFIL 10 MG PO TABS: 10.0000 mg | ORAL_TABLET | ORAL | 1 refills | Status: DC | PRN

## 2023-10-24 MED ORDER — TADALAFIL 5 MG PO TABS: 5.0000 mg | ORAL_TABLET | Freq: Every day | ORAL | 3 refills | Status: AC

## 2023-11-20 ENCOUNTER — Ambulatory Visit: Payer: Medicare HMO | Admitting: Urology

## 2023-12-11 ENCOUNTER — Encounter: Payer: Self-pay | Admitting: Urology

## 2023-12-11 ENCOUNTER — Ambulatory Visit: Admitting: Urology

## 2023-12-11 VITALS — BP 198/125 | HR 97 | Ht 69.0 in | Wt 232.0 lb

## 2023-12-11 DIAGNOSIS — R972 Elevated prostate specific antigen [PSA]: Secondary | ICD-10-CM

## 2023-12-11 DIAGNOSIS — N138 Other obstructive and reflux uropathy: Secondary | ICD-10-CM | POA: Diagnosis not present

## 2023-12-11 DIAGNOSIS — E291 Testicular hypofunction: Secondary | ICD-10-CM | POA: Diagnosis not present

## 2023-12-11 DIAGNOSIS — C61 Malignant neoplasm of prostate: Secondary | ICD-10-CM | POA: Diagnosis not present

## 2023-12-11 DIAGNOSIS — N401 Enlarged prostate with lower urinary tract symptoms: Secondary | ICD-10-CM

## 2023-12-11 LAB — MICROSCOPIC EXAMINATION

## 2023-12-11 LAB — URINALYSIS, ROUTINE W REFLEX MICROSCOPIC
Bilirubin, UA: NEGATIVE
Ketones, UA: NEGATIVE
Leukocytes,UA: NEGATIVE
Nitrite, UA: NEGATIVE
Specific Gravity, UA: 1.02 (ref 1.005–1.030)
Urobilinogen, Ur: 0.2 mg/dL (ref 0.2–1.0)
pH, UA: 5.5 (ref 5.0–7.5)

## 2023-12-11 NOTE — Progress Notes (Signed)
 Assessment: 1. Elevated PSA - prior biopsies showing ASAP/low grade prostate cancer?; MRI negative   2. BPH with obstruction/lower urinary tract symptoms   3. Hypogonadism in male      Plan: I personally reviewed the patient's chart including provider notes, and lab results. It is unclear to me if the patient actually has a prior diagnosis of prostate cancer.  I do see documentation for ASAP on a biopsy from 2013.  He was being actively followed by Dr. Logan Bores from 2015 until 2019.  I do not see any documentation in those notes that would suggest a prior diagnosis of prostate cancer.  Unfortunately, I do not have records from the [place] for review. Given his rising PSA and prior biopsies with the possibility of ASAP and/or low risk prostate cancer, I think that further evaluation with a prostate MRI is reasonable. Prostate MRI will be scheduled.   Will contact him with results. Continue tadalafil. Will arrange for follow-up after MRI available. I did not recommend any testosterone replacement until we are more certain about his diagnosis of prostate cancer.  Chief Complaint:  Chief Complaint  Patient presents with   Prostate Cancer    History of Present Illness:  Timothy Chapman is a 78 y.o. male who is seen in consultation from Copland, Gwenlyn Found, MD for evaluation of elevated PSA, ASAP on prostate biopsy in 2013, BPH with LUTS, and hypogonadism.  Elevated PSA/ASAP on biopsy: He has a long history of elevated PSAs and has previously undergone prostate biopsy.  A prostate biopsy from 10/13 showed ASAP in the right lateral apex.  Prostate volume measured 78 mL.  PSA results: 1/16 4.1 1/17 5.57 8/17 5.25 12/18 8.86 9/20 6.75 10/21 8.02 10/22 8.95 1/23 8.72 11/23 9.48 2/25 10.48  MRI of the prostate from 7/19 showed a PI-RADS 3 lesion in the left apical zone; prostate volume 90 cm MRI of prostate from 10/22 without convincing evidence of clinically significant prostate cancer;  volume 84 cm.  He has been followed at the [place]. no records available for review. There is some documentation of a diagnosis of prostate cancer with Gleason 6 disease on biopsy from 2014.  I do not find additional documentation regarding this.  BPH with LUTS: He has a long history of BPH with lower urinary tract symptoms.  Cystoscopy from 9/17 showed lateral lobe enlargement without a median lobe.  He underwent placement of a UroLift by Dr. Logan Bores in 2017.  Since that time, he has been managed with daily tadalafil.  He reports that his lower urinary tract symptoms are stable.  He does have some frequency and nocturia.  No dysuria or gross hematuria. IPSS = 8/2 today.  Hypogonadism: He has a history of hypogonadism with low testosterone.  He was previously managed with injections.  These were discontinued due to his diagnosis of prostate cancer.   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   Cannot tolerate statins   Elevated PSA 09/26/2015   Encounter for rehabilitation  04/25/2015   Erectile dysfunction 01/17/2021   GERD (gastroesophageal reflux disease)    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 esophagitis 01/17/2021   History of adenomatous polyp of colon 06/25/2022   Sep 28, 2015 Entered By: Polly Cobia Comment: 2000 polyp; +++; 2012 tics; *Surveillance 2017Jan 11, 2017 Entered By: Polly Cobia Comment: MGM=CRC age 41   History of diverticulitis 02/08/2016   HYPERTENSION, BENIGN ESSENTIAL, UNCONTROLLED 03/12/2009   Qualifier: Diagnosis of  By: Cathren Harsh MD, Stephen     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   Insomnia 02/08/2016   Malignant neoplasm of prostate (HCC) 03/25/2021   Metabolic syndrome 06/25/2022   Myopia of both eyes 03/21/2016   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   Feb 19, 2020 Entered By: [id number] Comment: statin induced   Neuromuscular disorder (HCC)    neuropathy but being treated   Nuclear sclerotic cataract of both eyes 03/21/2016   Pain in joint, pelvic region and thigh 06/25/2022   Paroxysmal atrial fibrillation (HCC) 12/16/2017   PONV (postoperative nausea and vomiting)    Presbyopia 03/21/2016   Regular astigmatism of both eyes 03/21/2016   Retinal edema 12/13/2017   SBO (small bowel obstruction) (HCC) 10/08/2017   Septic shock (HCC) 10/04/2017   Sleep apnea    has a sleep study but refuses that he has sleep study, not wearing CPAP   Tributary (branch) retinal vein occlusion, left eye, 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/25/2022    Past Surgical History:  Past Surgical History:  Procedure Laterality Date   BACK SURGERY     X2 - lower   CHOLECYSTECTOMY N/A 12/20/2017   Procedure: LAPAROSCOPIC CHOLECYSTECTOMY WITH INTRAOPERATIVE CHOLANGIOGRAM POSSIBLE OPEN;  Surgeon: Claud Kelp, MD;  Location[facility];  Service: General;  Laterality: N/A;   COLONOSCOPY     ESOPHAGOGASTRODUODENOSCOPY     IR RADIOLOGIST EVAL & MGMT  10/30/2017   IR RADIOLOGIST EVAL & MGMT  11/14/2017   PROSTATE SURGERY  2017   uro lift per pt.   REPLACEMENT TOTAL KNEE Left    ROTATOR CUFF REPAIR     Bil    Allergies:  Allergies   Allergen Reactions   Codeine     hallucinations   Farxiga [Dapagliflozin] Other (See Comments)    Genital infection and irritation   Statins     Has tried several cannot tolerate    Family History:  Family History  Problem Relation Age of Onset   Heart failure Father    Heart disease Father    Emphysema Mother    Colon cancer Maternal Grandmother     Social History:  Social History   Tobacco Use   Smoking status: Former    Current packs/day: 0.00    Types: Cigarettes    Quit date: 09/18/1975    Years since quitting: 48.2   Smokeless tobacco: Never  Vaping Use   Vaping status: Never Used  Substance Use Topics   Alcohol use: Not Currently    Alcohol/week: 0.0 standard drinks of alcohol    Comment: Rarely   Drug use: Never    Review of symptoms:  Constitutional:  Negative for unexplained weight loss, night sweats, fever, chills ENT:  Negative for nose bleeds, sinus pain, painful swallowing CV:  Negative for chest pain, shortness of breath, exercise intolerance, palpitations, loss of consciousness Resp:  Negative for cough, wheezing, shortness of breath GI:  Negative for nausea, vomiting, diarrhea, bloody stools GU:  Positives noted in HPI; otherwise negative for gross hematuria, dysuria, urinary incontinence Neuro:  Negative for seizures, poor balance, limb weakness, slurred speech Psych:  Negative for lack of energy, depression, anxiety Endocrine:  Negative for polydipsia, polyuria, symptoms of hypoglycemia (dizziness, hunger, sweating) Hematologic:  Negative for anemia, purpura, petechia, prolonged or excessive bleeding, use of anticoagulants  Allergic:  Negative for difficulty breathing or choking as a result of exposure to anything; no shellfish allergy; no allergic response (rash/itch) to materials, foods  Physical exam: BP (!) 198/125   Pulse 97   Ht 5\' 9"  (1.753 m)   Wt 232 lb (105.2 kg)   BMI 34.26 kg/m  GENERAL APPEARANCE:  Well appearing, well  developed, well nourished, NAD HEENT: Atraumatic, Normocephalic, oropharynx clear. NECK: Supple without lymphadenopathy or thyromegaly. LUNGS: Clear to auscultation bilaterally. HEART: Regular Rate and Rhythm without murmurs, gallops, or rubs. ABDOMEN: Soft, non-tender, No Masses. EXTREMITIES: Moves all extremities well.  Without clubbing, cyanosis, or edema. NEUROLOGIC:  Alert and oriented x 3, normal gait, CN II-XII grossly intact.  MENTAL STATUS:  Appropriate. BACK:  Non-tender to palpation.  No CVAT SKIN:  Warm, dry and intact.   GU: Prostate: 60+ g, NT, no nodules Rectum: Normal tone,  no masses or tenderness   Results: U/A:  0-5 WBC, 0-2 RBC

## 2023-12-13 ENCOUNTER — Ambulatory Visit: Payer: Medicare HMO | Admitting: Urology

## 2023-12-25 ENCOUNTER — Ambulatory Visit: Payer: Medicare HMO

## 2023-12-25 DIAGNOSIS — I10 Essential (primary) hypertension: Secondary | ICD-10-CM

## 2023-12-25 DIAGNOSIS — E1140 Type 2 diabetes mellitus with diabetic neuropathy, unspecified: Secondary | ICD-10-CM

## 2023-12-25 DIAGNOSIS — K219 Gastro-esophageal reflux disease without esophagitis: Secondary | ICD-10-CM

## 2023-12-25 DIAGNOSIS — E782 Mixed hyperlipidemia: Secondary | ICD-10-CM

## 2023-12-25 MED ORDER — FAMOTIDINE 20 MG PO TABS: 20.0000 mg | ORAL_TABLET | Freq: Two times a day (BID) | ORAL | 1 refills | Status: DC

## 2023-12-25 MED ORDER — FREESTYLE LIBRE 3 PLUS SENSOR MISC: 1 refills | Status: DC

## 2023-12-25 MED ORDER — REPATHA SURECLICK 140 MG/ML ~~LOC~~ SOAJ: 140.0000 mg | SUBCUTANEOUS | 5 refills | Status: DC

## 2023-12-25 MED ORDER — OMEPRAZOLE 40 MG PO CPDR: 40.0000 mg | DELAYED_RELEASE_CAPSULE | Freq: Every day | ORAL | 1 refills | Status: DC

## 2023-12-25 MED ORDER — VALSARTAN 320 MG PO TABS: 320.0000 mg | ORAL_TABLET | Freq: Every day | ORAL | 1 refills | Status: DC

## 2023-12-25 NOTE — Progress Notes (Unsigned)
 12/25/2023 Name: Timothy Chapman MRN: [id number] DOB: Jan 09, 1946  Chief Complaint  Patient presents with   Medication Management   Diabetes   Hyperlipidemia   Hypertension    Timothy Chapman is a 78 y.o. year old male. Timothy Chapman has been referred for Medication Assistance, DM, hypertension and hyperlipidemia.   Type 2 Diabetes:  Current Therapy: Mounjaro 12.5mg  weekly - getting thru [place]; Metformin 1000mg  twice a day    He was using Libre 3+ Continuous Glucose Monitor sensors but has not used them in over a month. He states that he stopped due to cost and he felt that the sensor was not accurate. He was bothered by low's during the night which were not really low's. I suspect he might have gotten compression low alert which can occur if laying on the sensor due to decrease blood circulation to the area around the sensor.   Patient reports he has been following low CHO / keto diet most days but occasionally slips. His wife has been supportive and trying to cook according to his dietary needs.   Exercise: usually 2 to 4 days per week - walking, elliptical or recumbent bike.   He does mention that since January he has been using a cane to walk due to having several falls. When asked if he felt falls were possibly related to becoming dizzy or weakness he was non specific.   Last office weight was 232 lbs- has lost about 20 lbs since 11/2022 when he started [facility]. Starting weight was 252 lbs Starting BMI = 37.2 Current BMI = 34.26  Wt Readings from Last 3 Encounters:  12/11/23 232 lb (105.2 kg)  10/24/23 239 lb (108.4 kg)  09/25/23 232 lb (105.2 kg)    Hyperlipidemia:  Continues to use Repatha - using [employer] for cost - grant good thru 08/27/2024   Hypertension: Current therapy: valsartan 320mg  daily  Home blood pressure - has not been checking blood pressure at home recently  Blood pressure at [place] last week on 12/19/2023 was 160/95  BP Readings from Last 3 Encounters:   12/11/23 (!) 198/125  10/24/23 138/83  11/28/22 138/88   Medication Management:  Patient has question about cialis / tadalafil and renal function. He mentions that [place] urologist changed tadalafil to Levitra. Per patient this was because Dr Kai Levins, urologist at the [facility] said that tadalafil could be affecting his kidneys. Timothy Chapman [facility] but [facility] because in [facility] records patient was allergic to Levitra. I review what I could see of [facility] and it appears that patient had recorded that he had tried Levitra in the past and it was not effective.   Estimated Creatinine Clearance = 73 mL / min or modified for actual body weight = 59 mL per min.    Objective:  Lab Results  Component Value Date   HGBA1C 7.0 (H) 10/24/2023    Lab Results  Component Value Date   CREATININE 1.30 10/24/2023   BUN 25 (H) 10/24/2023   NA 139 10/24/2023   K 4.3 10/24/2023   CL 106 10/24/2023   CO2 23 10/24/2023    Lab Results  Component Value Date   CHOL 111 10/24/2023   HDL 34.80 (L) 10/24/2023   LDLCALC 12 10/24/2023   LDLDIRECT 106.0 08/16/2021   TRIG 320.0 (H) 10/24/2023   CHOLHDL 3 10/24/2023    Medications Reviewed Today     Reviewed by Henrene Pastor, RPH-CPP (Pharmacist) on  12/25/23 at 1352  Med List Status: <None>   Medication Order Taking? Sig Documenting Provider Last Dose Status Informant  Acetylcarnitine HCl 500 MG CAPS [id number] Yes Take 500 mg by mouth daily. [provider] Taking Active Self           Med Note Kai Levins, MELISSA B   Thu Nov 21, 2017 12:03 PM)    Alcohol Swabs (B-D SINGLE USE SWABS REGULAR) PADS [id number]  USE PRIOR TO INSULIN DOSE Copland, Timothy Found, MD  Active   Alpha-Lipoic Acid 600 MG CAPS [id number] Yes Take 600 mg by mouth daily. [provider] Taking Active Self           Med Note Timothy Chapman, Hennesy Sobalvarro B   Tue Apr 04, 2021  1:16 PM)    Ashwagandha 500 MG CAPS [id number] Yes Take 1,000 mg by  mouth at bedtime. [provider] Taking Active   aspirin 81 MG chewable tablet [id number] Yes Chew 1 tablet (81 mg total) by mouth daily. Burnadette Pop, MD Taking Active Self  Continuous Glucose Sensor (FREESTYLE LIBRE 3 PLUS SENSOR) MISC [id number] No Use to check blood glucose continuously. Change sensor every 15 days.  Patient not taking: Reported on 12/25/2023   Copland, Timothy Found, MD Not Taking Active   diclofenac (VOLTAREN) 75 MG EC tablet [id number]  Take 1 tablet (75 mg total) by mouth 2 (two) times daily. Copland, Timothy Found, MD  Active   Evolocumab [facility] SURECLICK) 140 MG/ML Ivory Broad [id number] Yes Inject 140 mg into the skin every 14 (fourteen) days. Copland, Timothy Found, MD Taking Active   famotidine (PEPCID) 20 MG tablet [id number] Yes Take 1 tablet (20 mg total) by mouth 2 (two) times daily. Copland, Timothy Found, MD Taking Active   HYDROcodone-acetaminophen (NORCO/VICODIN) 5-325 MG tablet [id number]  Take 1 tablet by mouth every 8 (eight) hours as needed. Copland, Timothy Found, MD  Active   Lancets [facility] [place] [facility] DELICA PLUS [place]) MISC [id number]  USE TWO TIMES A DAY TO     CHECK BLOOD SUGAR Copland, Timothy Found, MD  Active   melatonin 5 MG TABS [id number] Yes Take 5 mg by mouth at bedtime.  [provider] Taking Active Self  metFORMIN (GLUCOPHAGE-XR) 500 MG 24 hr tablet [id number] Yes Take 2 tablets (1,000 mg total) by mouth 2 (two) times daily with a meal. Copland, Timothy Found, MD Taking Active   Multiple Vitamin (MULTIVITAMIN) tablet [id number]  Take 1 tablet by mouth daily. [provider]  Active Self  niacin (NIASPAN) 1000 MG CR tablet [id number]  Take 1,000 mg by mouth at bedtime. [provider]  Active            Med Note Timothy Chapman, Cindie Laroche Nov 23, 2021  2:06 PM) Patient is taking over-the-counter niacin  omeprazole (PRILOSEC) 40 MG capsule [id number] Yes Take 1 capsule (40 mg total) by mouth daily. Copland, Timothy Found, MD Taking Active   [facility] VERIO test strip  [id number]  USE TO CHECK BLOOD GLUCOSE TWO TIMES A DAY Copland, Timothy Found, MD  Active   tadalafil (CIALIS) 10 MG tablet [id number]  Take 1-2 tablets (10-20 mg total) by mouth every other day as needed for erectile dysfunction. Copland, Timothy Found, MD  Active   tadalafil (CIALIS) 20 MG tablet [id number]  Take 1 tablet (20 mg total) by mouth daily as needed for erectile dysfunction. Max 20 mg total per day Copland, Timothy Found, MD  Active   tadalafil (CIALIS) 5 MG tablet [id number]  Take  1 tablet (5 mg total) by mouth daily. Copland, Timothy Found, MD  Active   tirzepatide [facility]) 12.5 MG/0.5ML Pen [id number] Yes Inject 12.5 mg into the skin once a week. Copland, Timothy Found, MD Taking Active            Med Note Timothy Chapman, [place] B   Mon Apr 29, 2023  1:40 PM) Getting from [place]  valsartan (DIOVAN) 320 MG tablet [id number] Yes Take 1 tablet (320 mg total) by mouth daily. Copland, Timothy Found, MD Taking Active   VITAMIN D-VITAMIN K PO [id number]  Take 1 tablet by mouth daily. [provider]  Active   zinc gluconate 50 MG tablet [id number]  Take 50 mg by mouth daily. [provider]  Active                Assessment/Plan:  Type 2 DM - blood glucose stable  Continue current medications for diabetes - Mounjaro 12.5mg  weekly, metformin.  I did send in Rx for Libre 3 + sensors but at request of patient had pharmacy place on hold until he requests.  Continue low CHO / keto diet.  Continue physical activity with goal of at least 150 mins per week.   Hypertension - last office blood pressure in our office was at goal but other blood pressure reading have been high.  Continue valsartan 320mg  daily  Reviewed blood pressure goal Patient to check blood pressure 2 to 3 times per week - call office if blood pressure > 140/90  Hyperlipidemia - LDL at goal; Tg were elevated ; HDLC still below goal of > 40.  Continue Repatha every 14 days - enrolled in [facility] - good thru 08/28/2024 Increase in  exercise / weight loss should help with HDLC and triglycerides.   Medicaton Managemetn:   Follow Up Plan: 2 to 3 months  Henrene Pastor, PharmD Clinical Pharmacist [facility]

## 2023-12-29 ENCOUNTER — Encounter: Payer: Self-pay | Admitting: Family Medicine

## 2024-01-18 ENCOUNTER — Other Ambulatory Visit: Payer: Self-pay | Admitting: Family Medicine

## 2024-01-18 DIAGNOSIS — M2550 Pain in unspecified joint: Secondary | ICD-10-CM

## 2024-01-29 ENCOUNTER — Other Ambulatory Visit: Payer: Self-pay | Admitting: Family Medicine

## 2024-02-01 ENCOUNTER — Ambulatory Visit
Admission: RE | Admit: 2024-02-01 | Discharge: 2024-02-01 | Disposition: A | Discharge status: Home / Self Care | Source: Ambulatory Visit | Attending: Urology | Admitting: Urology

## 2024-02-01 DIAGNOSIS — R972 Elevated prostate specific antigen [PSA]: Secondary | ICD-10-CM | POA: Diagnosis not present

## 2024-02-01 MED ORDER — GADOPICLENOL 0.5 MMOL/ML IV SOLN
10.0000 mL | Freq: Once | INTRAVENOUS | Status: AC | PRN
  Administered 2024-02-01: 10 mL via INTRAVENOUS

## 2024-02-05 ENCOUNTER — Ambulatory Visit: Payer: Self-pay | Admitting: Urology

## 2024-02-17 DIAGNOSIS — Z8249 Family history of ischemic heart disease and other diseases of the circulatory system: Secondary | ICD-10-CM | POA: Diagnosis not present

## 2024-02-17 DIAGNOSIS — I10 Essential (primary) hypertension: Secondary | ICD-10-CM | POA: Diagnosis not present

## 2024-02-17 DIAGNOSIS — E785 Hyperlipidemia, unspecified: Secondary | ICD-10-CM | POA: Diagnosis not present

## 2024-02-17 DIAGNOSIS — Z008 Encounter for other general examination: Secondary | ICD-10-CM | POA: Diagnosis not present

## 2024-02-17 DIAGNOSIS — Z87891 Personal history of nicotine dependence: Secondary | ICD-10-CM | POA: Diagnosis not present

## 2024-02-17 DIAGNOSIS — N529 Male erectile dysfunction, unspecified: Secondary | ICD-10-CM | POA: Diagnosis not present

## 2024-02-17 DIAGNOSIS — F320 Major depressive disorder, single episode, mild: Secondary | ICD-10-CM | POA: Diagnosis not present

## 2024-02-17 DIAGNOSIS — M1990 Unspecified osteoarthritis, unspecified site: Secondary | ICD-10-CM | POA: Diagnosis not present

## 2024-02-17 DIAGNOSIS — Z833 Family history of diabetes mellitus: Secondary | ICD-10-CM | POA: Diagnosis not present

## 2024-02-17 DIAGNOSIS — Z7984 Long term (current) use of oral hypoglycemic drugs: Secondary | ICD-10-CM | POA: Diagnosis not present

## 2024-02-17 DIAGNOSIS — I2510 Atherosclerotic heart disease of native coronary artery without angina pectoris: Secondary | ICD-10-CM | POA: Diagnosis not present

## 2024-02-17 DIAGNOSIS — E1142 Type 2 diabetes mellitus with diabetic polyneuropathy: Secondary | ICD-10-CM | POA: Diagnosis not present

## 2024-02-20 ENCOUNTER — Encounter: Payer: Self-pay | Admitting: Urology

## 2024-02-24 ENCOUNTER — Other Ambulatory Visit: Payer: Self-pay | Admitting: Family Medicine

## 2024-03-05 ENCOUNTER — Ambulatory Visit (INDEPENDENT_AMBULATORY_CARE_PROVIDER_SITE_OTHER): Admitting: *Deleted

## 2024-03-05 ENCOUNTER — Ambulatory Visit

## 2024-03-05 ENCOUNTER — Telehealth: Payer: Self-pay | Admitting: *Deleted

## 2024-03-05 VITALS — Ht 69.0 in | Wt 235.0 lb

## 2024-03-05 DIAGNOSIS — M51361 Other intervertebral disc degeneration, lumbar region with lower extremity pain only: Secondary | ICD-10-CM

## 2024-03-05 DIAGNOSIS — G8929 Other chronic pain: Secondary | ICD-10-CM

## 2024-03-05 DIAGNOSIS — M549 Dorsalgia, unspecified: Secondary | ICD-10-CM | POA: Diagnosis not present

## 2024-03-05 DIAGNOSIS — E1140 Type 2 diabetes mellitus with diabetic neuropathy, unspecified: Secondary | ICD-10-CM | POA: Diagnosis not present

## 2024-03-05 DIAGNOSIS — Z5982 Transportation insecurity: Secondary | ICD-10-CM

## 2024-03-05 DIAGNOSIS — Z0000 Encounter for general adult medical examination without abnormal findings: Secondary | ICD-10-CM | POA: Diagnosis not present

## 2024-03-05 NOTE — Patient Instructions (Addendum)
 [name] , Thank you for taking time out of your busy schedule to complete your Annual Wellness Visit with me. I enjoyed our conversation and look forward to speaking with you again next year. I, as well as your care team,  appreciate your ongoing commitment to your health goals. Please review the following plan we discussed and let me know if I can assist you in the future. Your Game plan/ To Do List   I placed a referral for someone to contact you about transportation options. This may take a few weeks.  Please let us  know if you have not been contacted by anyone within 30 days.  Follow up Visits: Next Medicare AWV with our clinical staff:   03/09/25 2:20pm Next Office Visit with your provider: 03/12/24 11:40am video visit for  home health referral and 04/27/24 3:40pm for 6 month follow up in  person.  Clinician Recommendations:  Aim for 30 minutes of exercise or brisk walking, 6-8 glasses of water, and 5 servings of fruits and vegetables each day.       This is a list of the screening recommended for you and due dates:  Health Maintenance  Topic Date Due   Complete foot exam   08/16/2022   Yearly kidney health urinalysis for diabetes  11/21/2023   COVID-19 Vaccine (8 - Pfizer risk 2024-25 season) 01/28/2024   Medicare Annual Wellness Visit  03/05/2024   Flu Shot  04/17/2024   Hemoglobin A1C  04/22/2024   Eye exam for diabetics  07/22/2024   Yearly kidney function blood test for diabetes  10/23/2024   Colon Cancer Screening  07/07/2028   DTaP/Tdap/Td vaccine (4 - Td or Tdap) 11/16/2031   Pneumococcal Vaccine for age over 64  Completed   Hepatitis C Screening  Completed   Zoster (Shingles) Vaccine  Completed   HPV Vaccine  Aged Out   Meningitis B Vaccine  Aged Out   I am mailing a packet of advanced directives to you.  Please let me know if you haven't received it within a week. Once competed and notarized, you may return them by either of the following. Bring a copy of your health  care power of attorney and living will to the office to be added to your chart at your convenience. You can mail a copy to [facility] [address] [place], [place] [address] or email to [email or web address]  Advance Care Planning is important because it:  [x]  Makes sure you receive the medical care that is consistent with your values, goals, and preferences  [x]  It provides guidance to your family and loved ones and reduces their decisional burden about whether or not they are making the right decisions based on your wishes.  Follow the link provided in your after visit summary or read over the paperwork we have mailed to you to help you started getting your Advance Directives in place. If you need assistance in completing these, please reach out to us  so that we can help you!  See attachments for Exercise and Fall Prevention Tips.

## 2024-03-05 NOTE — Telephone Encounter (Signed)
 Pt had AWVS today. Needs a good bit of assistance in his home due to his mobility / balance issues. 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 He is requesting a handicap placard (I think can be printed off line) and an order for a rolling walker. He has to rely on his spouse for transportation.  I scheduled his regular follow up for 04/27/24. Let me know if he needs to come in early due to these requests.

## 2024-03-05 NOTE — Progress Notes (Signed)
 Please attest this visit in the absence of patient primary care provider.    Subjective:   Timothy Chapman is a 78 y.o. who presents for a Medicare Wellness preventive visit.  As a reminder, Annual Wellness Visits don't include a physical exam, and some assessments may be limited, especially if this visit is performed virtually. We may recommend an in-person follow-up visit with your provider if needed.  Visit Complete: Virtual I connected with  Timothy Chapman on 03/05/24 by a audio enabled telemedicine application and verified that I am speaking with the correct person using two identifiers.  Patient Location: Home  Provider Location: Office/Clinic  I discussed the limitations of evaluation and management by telemedicine. The patient expressed understanding and agreed to proceed.  Vital Signs: Because this visit was a virtual/telehealth visit, some criteria may be missing or patient reported. Any vitals not documented were not able to be obtained and vitals that have been documented are patient reported.  Persons Participating in Visit: Patient.  AWV Questionnaire: Yes: Patient Medicare AWV questionnaire was completed by the patient on 03/05/24; I have confirmed that all information answered by patient is correct and no changes since this date.  Cardiac Risk Factors include: advanced age (>43men, >74 women);diabetes mellitus;hypertension;dyslipidemia;obesity (BMI >30kg/m2);male gender     Objective:    Today's Vitals   03/05/24 1421  Weight: 235 lb (106.6 kg)  Height: 5' 9 (1.753 m)   Body mass index is 34.7 kg/m.     03/06/2023    1:46 PM 02/27/2022    2:22 PM 02/22/2021    1:09 PM 09/25/2019    2:06 PM 12/12/2017    1:05 PM 10/02/2017    9:37 AM  Advanced Directives  Does Patient Have a Medical Advance Directive? No No No No No  No   Would patient like information on creating a medical advance directive? No - Patient declined No - Patient declined No - Patient declined No -  Patient declined No - Patient declined  No - Patient declined      Data saved with a previous flowsheet row definition    Current Medications (verified) Outpatient Encounter Medications as of 03/05/2024  Medication Sig   Acetylcarnitine HCl 500 MG CAPS Take 500 mg by mouth daily.   Alcohol  Swabs  (B-D SINGLE USE SWABS  REGULAR) PADS USE PRIOR TO INSULIN  DOSE   Alpha-Lipoic Acid 600 MG CAPS Take 600 mg by mouth daily.   Ashwagandha 500 MG CAPS Take 1,000 mg by mouth at bedtime.   aspirin  81 MG chewable tablet Chew 1 tablet (81 mg total) by mouth daily.   b complex vitamins capsule Take 1 capsule by mouth daily.   Continuous Glucose Sensor (FREESTYLE LIBRE 3 PLUS SENSOR) MISC Use to check blood glucose continuously. Change sensor every 15 days.   diclofenac  (VOLTAREN ) 75 MG EC tablet TAKE 1 TABLET TWICE A DAY   Evolocumab  (REPATHA  SURECLICK) 140 MG/ML SOAJ Inject 140 mg into the skin every 14 (fourteen) days.   famotidine  (PEPCID ) 20 MG tablet Take 1 tablet (20 mg total) by mouth 2 (two) times daily.   glucose blood (ONETOUCH VERIO) test strip USE TO CHECK BLOOD GLUCOSE TWO TIMES A DAY   HYDROcodone -acetaminophen  (NORCO/VICODIN) 5-325 MG tablet Take 1 tablet by mouth every 8 (eight) hours as needed.   Lancets (ONETOUCH DELICA PLUS LANCET30G) MISC USE TWO TIMES A DAY TO     CHECK BLOOD SUGAR   melatonin 5 MG TABS Take 5 mg by mouth at  bedtime.    metFORMIN  (GLUCOPHAGE -XR) 500 MG 24 hr tablet Take 2 tablets (1,000 mg total) by mouth 2 (two) times daily with a meal.   niacin  (NIASPAN ) 1000 MG CR tablet Take 500 mg by mouth 2 (two) times daily.   omeprazole  (PRILOSEC) 40 MG capsule Take 1 capsule (40 mg total) by mouth daily.   tadalafil  (CIALIS ) 10 MG tablet Take 1-2 tablets (10-20 mg total) by mouth every other day as needed for erectile dysfunction.   tadalafil  (CIALIS ) 5 MG tablet Take 1 tablet (5 mg total) by mouth daily.   tirzepatide  (MOUNJARO ) 12.5 MG/0.5ML Pen Inject 12.5 mg into the skin  once a week.   valsartan  (DIOVAN ) 320 MG tablet Take 1 tablet (320 mg total) by mouth daily.   VITAMIN D -VITAMIN K PO Take 1 tablet by mouth daily.   zinc gluconate 50 MG tablet Take 50 mg by mouth daily.   Multiple Vitamin (MULTIVITAMIN) tablet Take 1 tablet by mouth daily. (Patient not taking: Reported on 03/05/2024)   [DISCONTINUED] tadalafil  (CIALIS ) 20 MG tablet Take 1 tablet (20 mg total) by mouth daily as needed for erectile dysfunction. Max 20 mg total per day   No facility-administered encounter medications on file as of 03/05/2024.    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   Cannot tolerate statins   Elevated PSA 09/26/2015   Encounter for rehabilitation 04/25/2015   Erectile dysfunction 01/17/2021   GERD (gastroesophageal reflux disease)    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 esophagitis 01/17/2021   History of adenomatous polyp of colon 06/25/2022   Sep 28, 2015 Entered By: WELBY AURELIANO HERO Comment: 2000 polyp; +++; 2012 tics;  *Surveillance 2017Jan 11, 2017 Entered By: WELBY AURELIANO HERO Comment: MGM=CRC age 33   History of diverticulitis 02/08/2016   HYPERTENSION, BENIGN ESSENTIAL, UNCONTROLLED 03/12/2009   Qualifier: Diagnosis of  By: Pauline MD, Stephen     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   Insomnia 02/08/2016   Malignant neoplasm of prostate (HCC) 03/25/2021   Metabolic syndrome 06/25/2022   Myopia of both eyes 03/21/2016   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   Feb 19, 2020 Entered By: [email or web address] Comment: statin induced   Neuromuscular disorder (HCC)    neuropathy but being treated   Nuclear sclerotic cataract of both eyes 03/21/2016   Pain in joint, pelvic region and thigh 06/25/2022   Paroxysmal atrial fibrillation (HCC) 12/16/2017   PONV (postoperative nausea and vomiting)    Presbyopia 03/21/2016   Regular astigmatism of both eyes 03/21/2016   Retinal edema 12/13/2017   SBO (  small bowel obstruction) (HCC) 10/08/2017   Septic shock (HCC) 10/04/2017   Sleep apnea    has a sleep study but refuses that he has sleep study, not wearing CPAP   Tributary (branch) retinal vein occlusion, left eye, 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/25/2022   Past Surgical History:  Procedure Laterality Date   BACK SURGERY     X2 - lower   CHOLECYSTECTOMY N/A 12/20/2017   Procedure: LAPAROSCOPIC CHOLECYSTECTOMY WITH INTRAOPERATIVE CHOLANGIOGRAM POSSIBLE OPEN;  Surgeon: Gail Favorite, MD;  Location: [facility];  Service: General;  Laterality: N/A;   COLONOSCOPY     ESOPHAGOGASTRODUODENOSCOPY     IR RADIOLOGIST EVAL & MGMT  10/30/2017   IR RADIOLOGIST EVAL & MGMT  11/14/2017   PROSTATE SURGERY  2017   uro lift per pt.   REPLACEMENT TOTAL KNEE Left    ROTATOR CUFF REPAIR     Bil   Family History  Problem Relation Age of Onset   Heart failure Father    Heart disease Father    Emphysema Mother    Colon cancer Maternal Grandmother    Social History   Socioeconomic History   Marital status: Married    Spouse name: Not on file   Number of children: 2   Years of education:  Not on file   Highest education level: Associate degree: academic program  Occupational History   Not on file  Tobacco Use   Smoking status: Former    Current packs/day: 0.00    Types: Cigarettes    Quit date: 09/18/1975    Years since quitting: 48.4   Smokeless tobacco: Never  Vaping Use   Vaping status: Never Used  Substance and Sexual Activity   Alcohol  use: Not Currently    Alcohol /week: 0.0 standard drinks of alcohol     Comment: Rarely   Drug use: Never   Sexual activity: Not on file  Other Topics Concern   Not on file  Social History Narrative   Right handed   Caffeine 1-2 cups daily   Lives at home with wife and his son.   Social Drivers of Health   Financial Resource Strain: Medium Risk (03/06/2024)   Overall Financial Resource Strain (CARDIA)    Difficulty of Paying Living Expenses: Somewhat hard  Food Insecurity: No Food Insecurity (03/05/2024)   Hunger Vital Sign    Worried About Running Out of Food in the Last Year: Never true    Ran Out of Food in the Last Year: Never true  Transportation Needs: Unmet Transportation Needs (03/05/2024)   PRAPARE - Administrator, Civil Service (Medical): Yes    Lack of Transportation (Non-Medical): Yes  Physical Activity: Insufficiently Active (03/05/2024)   Exercise Vital Sign    Days of Exercise per Week: 2 days    Minutes of Exercise per Session: 30 min  Stress: No Stress Concern Present (03/06/2024)   [employer] of Occupational Health - Occupational Stress Questionnaire    Feeling of Stress: Not at all  Social Connections: Moderately Isolated (03/05/2024)   Social Connection and Isolation Panel    Frequency of Communication with Friends and Family: Three times a week    Frequency of Social Gatherings with Friends and Family: Never    Attends Religious Services: Never    Database administrator or Organizations: No    Attends Banker Meetings: Never    Marital Status: Married  All questions  were addressed on 6/19, forgot to document stress concern and addended on 6/20.  Tobacco Counseling Counseling given: Not Answered    Clinical Intake:  Pre-visit preparation completed: Yes      BMI - recorded: 34.7 Nutritional Status: BMI > 30  Obese Nutritional Risks: None Diabetes: Yes CBG done?: No Did pt. bring in CBG monitor from home?: No  Lab Results  Component Value Date   HGBA1C 7.0 (H) 10/24/2023   HGBA1C 6.5 04/04/2023   HGBA1C 10.2 (H) 11/21/2022     How often do you need to have someone help you when you read instructions, pamphlets, or other written materials from your doctor or pharmacy?: 3 - Sometimes (Has cataracts) What is the last grade level you completed in school?: Associates  Interpreter Needed?: No  Information entered by :: Lolita Libra, CMA   Activities of Daily Living     03/05/2024    2:19 PM  In your present state of health, do you have any difficulty performing the following activities:  Hearing? 0  Vision? 1  Comment 07/23/23 [facility]  Difficulty concentrating or making decisions? 0  Walking or climbing stairs? 1  Dressing or bathing? 1  Doing errands, shopping? 1  Preparing Food and eating ? N  Using the Toilet? N  In the past six months, have you accidently leaked urine? N  Do you have problems with loss of bowel control? N  Managing your Medications? N  Managing your Finances? N  Housekeeping or managing your Housekeeping? Y    Patient Care Team: Copland, Harlene BROCKS, MD as PCP - General (Family Medicine) Carla Milling, RPH-CPP (Pharmacist) [place], [facility]  I have updated your Care Teams any recent Medical Services you may have received from other providers in the past year.     Assessment:   This is a routine wellness examination for [facility].  Hearing/Vision screen Hearing Screening - Comments:: Denies hearing difficulties.  Vision Screening - Comments:: Last eye exam 07/23/23 with  [facility]    Goals Addressed   None    Depression Screen     03/05/2024    2:48 PM 10/24/2023    8:43 AM 03/06/2023    1:54 PM 02/27/2022    2:20 PM 04/26/2021   11:19 AM 02/22/2021    1:11 PM 09/25/2019    2:09 PM  PHQ 2/9 Scores  PHQ - 2 Score 0 0 0 0 0 0 0    Fall Risk     03/05/2024    2:19 PM 10/24/2023    8:42 AM 03/06/2023    1:48 PM 02/27/2022    2:23 PM 04/26/2021   11:19 AM  Fall Risk   Falls in the past year? 1 1 1 1 1   Number falls in past yr: 1 1 1 1    Injury with Fall? 1 1 0 1 0  Risk for fall due to : Impaired balance/gait Impaired mobility Impaired balance/gait;History of fall(s) Impaired mobility;Impaired balance/gait;Impaired vision   Follow up Education provided Falls evaluation completed Falls evaluation completed Falls prevention discussed       Data saved with a previous flowsheet row definition    MEDICARE RISK AT HOME:  Medicare Risk at Home Any stairs in or around the home?: (Patient-Rptd) Yes If so, are there any without handrails?: (Patient-Rptd) No Home free of loose throw rugs in walkways, pet beds, electrical cords, etc?: (Patient-Rptd) Yes Adequate lighting in your home to reduce risk of falls?: (Patient-Rptd) Yes Life alert?: (Patient-Rptd) No Use of a cane, walker or w/c?: Yes (has  cane) Grab bars in the bathroom?: (Patient-Rptd) No Shower chair or bench in shower?: (Patient-Rptd) No Elevated toilet seat or a handicapped toilet?: (Patient-Rptd) No  TIMED UP AND GO:  Was the test performed?  No,audio  Cognitive Function: 6CIT completed        03/05/2024    2:51 PM 03/06/2023    1:57 PM 02/27/2022    2:26 PM  6CIT Screen  What Year? 0 points 0 points 0 points  What month? 0 points 0 points 0 points  What time? 0 points 0 points 0 points  Count back from 20 0 points 0 points 0 points  Months in reverse 0 points 0 points 0 points  Repeat phrase 0 points 0 points 0 points  Total Score 0 points 0 points 0 points     Immunizations Immunization History  Administered Date(s) Administered   Fluad Quad(high Dose 65+) 05/28/2019, 07/06/2020   Fluad Trivalent(High Dose 65+) 07/31/2023   Hep A / Hep B 01/12/2016, 02/10/2016   Influenza, High Dose Seasonal PF 06/20/2016, 07/08/2017   Influenza-Unspecified 08/12/2021   PFIZER Comirnaty (Gray Top)Covid-19 Tri-Sucrose Vaccine 11/15/2019, 12/15/2019, 09/02/2020   PFIZER(Purple Top)SARS-COV-2 Vaccination 11/15/2019, 12/15/2019   Pfizer Covid-19 Vaccine Bivalent Booster 76yrs & up 08/16/2021   Pfizer(Comirnaty )Fall Seasonal Vaccine 12 years and older 07/31/2023   Pneumococcal Conjugate-13 06/20/2016   Pneumococcal Polysaccharide-23 02/06/2018, 05/28/2019   Pneumococcal-Unspecified 12/20/2011, 05/18/2012   Td 11/15/2021   Tdap 09/18/2011, 10/31/2011   Typhoid Inactivated 01/12/2016   Zoster Recombinant(Shingrix) 02/19/2020, 05/03/2020    Screening Tests Health Maintenance  Topic Date Due   FOOT EXAM  08/16/2022   Diabetic kidney evaluation - Urine ACR  11/21/2023   COVID-19 Vaccine (8 - Pfizer risk 2024-25 season) 01/28/2024   Medicare Annual Wellness (AWV)  03/05/2024   INFLUENZA VACCINE  04/17/2024   HEMOGLOBIN A1C  04/22/2024   OPHTHALMOLOGY EXAM  07/22/2024   Diabetic kidney evaluation - eGFR measurement  10/23/2024   Colonoscopy  07/07/2028   DTaP/Tdap/Td (4 - Td or Tdap) 11/16/2031   Pneumococcal Vaccine: 50+ Years  Completed   Hepatitis C Screening  Completed   Zoster Vaccines- Shingrix  Completed   HPV VACCINES  Aged Out   Meningococcal B Vaccine  Aged Out    Health Maintenance  Health Maintenance Due  Topic Date Due   FOOT EXAM  08/16/2022   Diabetic kidney evaluation - Urine ACR  11/21/2023   COVID-19 Vaccine (8 - Pfizer risk 2024-25 season) 01/28/2024   Medicare Annual Wellness (AWV)  03/05/2024   Health Maintenance Items Addressed: Diabetic Foot Exam recommended, urine MALB at upcoming OV.  Additional Screening:  Vision  Screening: Recommended annual ophthalmology exams for early detection of glaucoma and other disorders of the eye. Would you like a referral to an eye doctor? No    Dental Screening: Recommended annual dental exams for proper oral hygiene  Community Resource Referral / Chronic Care Management: CRR required this visit?  Yes   CCM required this visit?  No   Plan:    I have personally reviewed and noted the following in the patient's chart:   Medical and social history Use of alcohol , tobacco or illicit drugs  Current medications and supplements including opioid prescriptions. Patient is not currently taking opioid prescriptions. Functional ability and status Nutritional status Physical activity Advanced directives List of other physicians Hospitalizations, surgeries, and ER visits in previous 12 months Vitals Screenings to include cognitive, depression, and falls Referrals and appointments  In addition, I have reviewed and  discussed with patient certain preventive protocols, quality metrics, and best practice recommendations. A written personalized care plan for preventive services as well as general preventive health recommendations were provided to patient.   Lolita Libra, CMA   03/06/2024   After Visit Summary: (MyChart) Due to this being a telephonic visit, the after visit summary with patients personalized plan was offered to patient via MyChart   Notes: see phone note.

## 2024-03-06 ENCOUNTER — Encounter: Payer: Self-pay | Admitting: Family Medicine

## 2024-03-06 NOTE — Telephone Encounter (Signed)
 I also scheduled pt a mychart video visit with you on 03/12/24 at 11:40am to address ambulation issues due to multilevel DDD, chronic back pain and diabetic neuropathy. Would like to place home health referral for PT eval, strengthening / balance exercises as well as instruction on use of assistive devices for ambulation and ADLs and a home health aide for ADLs.     I verified that It looks like CMS still allows video but they have to carefully document that the audio and video was available, use the wording face to face and the visit was to address the clinical need for home health.

## 2024-03-08 NOTE — Progress Notes (Unsigned)
 [facility] [address], [address] [place], [place] [address] [phone]  Date:  03/12/2024   Name:  Timothy Chapman [facility]   DOB:  02/16/1946   MRN:  [id number]  PCP:  Watt Harlene BROCKS, MD    Chief Complaint: No chief complaint on file.   History of Present Illness:  Timothy Chapman is a 78 y.o. very pleasant male patient who presents with the following:  Patient seen today with concern of worsening back pain.  Most recent visit with myself was in February Virtual visit today- pt location is home and my location is office.   Pt gives consent for a virtual visit today. The pt and myself are present on the call today   History of diabetes, hypertension, chronic back pain, severe illness in 2019-he became ill with sepsis due to cholangitis, had acute respiratory failure and required intubation He receives some care through the [place] History of prostate cancer currently on active surveillance- he just had an MRI last month which was reassuring   Married to [name], they have a toddler son - Aloha is 3 yrs now!   He has noted increasing hip pain esp the last 6- 12 months, the right is the worst.  It is making it hard for him to walk,  he may fall and needs to use a rollator now.  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   Depression 06/25/2022   Diabetes mellitus treated with injections of non-insulin  medication (HCC) 06/25/2022   Neuromuscular disorder (HCC) 06/25/2022   PONV (postoperative nausea and vomiting) 06/25/2022   Sleep apnea 06/25/2022   Branch retinal vein  occlusion 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/25/2022   Tributary (branch) retinal vein occlusion, left eye, 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/25/2022   History of adenomatous polyp of colon 06/25/2022   Metabolic syndrome 06/25/2022   Pain in joint, pelvic region and thigh 06/25/2022   Diabetes mellitus with neuropathy (HCC) 06/25/2022   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   Malignant neoplasm of prostate (HCC) 03/25/2021   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 esophagitis 01/17/2021   Erectile dysfunction 01/17/2021   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   Paroxysmal atrial fibrillation (HCC) 12/16/2017   Retinal edema 12/13/2017   SBO (small bowel obstruction) (HCC) 10/08/2017   ATN (acute tubular necrosis) (HCC) 10/08/2017   Septic shock (HCC) 10/04/2017   Acute cholecystitis s/p perc cholecystomy drainage 10/04/2017    Acute respiratory failure with hypoxia (HCC)    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   Myopia of both eyes 03/21/2016   Nuclear sclerotic cataract of both eyes 03/21/2016   Presbyopia 03/21/2016   Regular astigmatism of both eyes 03/21/2016   Chronic back pain 02/08/2016   Controlled type 2 diabetes mellitus with diabetic neuropathy, without long-term current use of insulin  (HCC) 02/08/2016   Insomnia 02/08/2016   History of diverticulitis 02/08/2016   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   Elevated PSA 09/26/2015   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   Encounter for rehabilitation 04/25/2015   ABNORMAL ELECTROCARDIOGRAM 03/12/2009    Past Medical  History:  Diagnosis Date   ABNORMAL ELECTROCARDIOGRAM 03/12/2009   Qualifier: Diagnosis of  By: Pauline MD, Stephen     Acute cholecystitis s/p perc cholecystomy drainage 10/04/2017    Acute respiratory failure with hypoxia (HCC)    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   Cannot tolerate statins   Elevated PSA 09/26/2015   Encounter for rehabilitation 04/25/2015   Erectile dysfunction 01/17/2021   GERD (gastroesophageal reflux disease)    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 esophagitis 01/17/2021   History of adenomatous polyp of colon 06/25/2022   Sep 28, 2015 Entered By: WELBY AURELIANO HERO Comment: 2000 polyp; +++; 2012 tics; *Surveillance 2017Jan 11, 2017 Entered By: WELBY AURELIANO HERO Comment: MGM=CRC age 45   History of diverticulitis 02/08/2016   HYPERTENSION, BENIGN ESSENTIAL, UNCONTROLLED 03/12/2009   Qualifier: Diagnosis of  By: Pauline MD, Stephen     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   Insomnia 02/08/2016   Malignant neoplasm of prostate (HCC) 03/25/2021   Metabolic syndrome 06/25/2022   Myopia of both eyes 03/21/2016   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   Feb 19, 2020 Entered By: [email or web address] Comment: statin induced   Neuromuscular disorder (HCC)    neuropathy but being treated   Nuclear sclerotic cataract of both eyes 03/21/2016   Pain in joint, pelvic region and thigh 06/25/2022   Paroxysmal atrial fibrillation (HCC) 12/16/2017   PONV (postoperative nausea and vomiting)    Presbyopia 03/21/2016   Regular astigmatism of both eyes 03/21/2016   Retinal edema 12/13/2017   SBO (small bowel obstruction) (HCC) 10/08/2017   Septic shock (HCC) 10/04/2017   Sleep apnea    has a sleep study but refuses that he has sleep study, not wearing CPAP   Tributary (branch) retinal vein occlusion, left eye, with macular edema  06/25/2022    Past Surgical History:  Procedure Laterality Date   BACK SURGERY     X2 - lower   CHOLECYSTECTOMY N/A 12/20/2017   Procedure: LAPAROSCOPIC CHOLECYSTECTOMY WITH INTRAOPERATIVE CHOLANGIOGRAM POSSIBLE OPEN;  Surgeon: Gail Favorite, MD;  Location: [facility] OR;  Service: General;  Laterality: N/A;   COLONOSCOPY     ESOPHAGOGASTRODUODENOSCOPY     IR RADIOLOGIST EVAL & MGMT  10/30/2017   IR RADIOLOGIST EVAL & MGMT  11/14/2017   PROSTATE SURGERY  2017   uro lift per pt.   REPLACEMENT TOTAL KNEE Left    ROTATOR CUFF REPAIR     Bil    Social History   Tobacco Use   Smoking status: Former    Current packs/day: 0.00    Types: Cigarettes    Quit date: 09/18/1975    Years since quitting: 48.5   Smokeless tobacco: Never  Vaping Use   Vaping status: Never Used  Substance Use Topics   Alcohol  use: Not Currently    Alcohol /week: 0.0 standard drinks of alcohol   Comment: Rarely   Drug use: Never    Family History  Problem Relation Age of Onset   Heart failure Father    Heart disease Father    Emphysema Mother    Colon cancer Maternal Grandmother     Allergies  Allergen Reactions   Codeine     hallucinations   Farxiga  [Dapagliflozin ] Other (See Comments)    Genital infection and irritation   Statins     Has tried several cannot tolerate    Medication list has been reviewed and updated.  Current Outpatient Medications on File Prior to Visit  Medication Sig Dispense Refill   Acetylcarnitine HCl 500 MG CAPS Take 500 mg by mouth daily.     Alcohol  Swabs  (B-D SINGLE USE SWABS  REGULAR) PADS USE PRIOR TO INSULIN  DOSE 200 each 7   Alpha-Lipoic Acid 600 MG CAPS Take 600 mg by mouth daily.     Ashwagandha 500 MG CAPS Take 1,000 mg by mouth at bedtime.     aspirin  81 MG chewable tablet Chew 1 tablet (81 mg total) by mouth daily. 30 tablet 0   b complex vitamins capsule Take 1 capsule by mouth daily.     Continuous Glucose Sensor (FREESTYLE LIBRE 3 PLUS SENSOR) MISC Use to check  blood glucose continuously. Change sensor every 15 days. 6 each 1   diclofenac  (VOLTAREN ) 75 MG EC tablet TAKE 1 TABLET TWICE A DAY 180 tablet 0   Evolocumab  (REPATHA  SURECLICK) 140 MG/ML SOAJ Inject 140 mg into the skin every 14 (fourteen) days. 2 mL 5   famotidine  (PEPCID ) 20 MG tablet Take 1 tablet (20 mg total) by mouth 2 (two) times daily. 180 tablet 1   glucose blood (ONETOUCH VERIO) test strip USE TO CHECK BLOOD GLUCOSE TWO TIMES A DAY 200 strip 6   HYDROcodone -acetaminophen  (NORCO/VICODIN) 5-325 MG tablet Take 1 tablet by mouth every 8 (eight) hours as needed. 30 tablet 0   Lancets (ONETOUCH DELICA PLUS LANCET30G) MISC USE TWO TIMES A DAY TO     CHECK BLOOD SUGAR 100 each 3   melatonin 5 MG TABS Take 5 mg by mouth at bedtime.      metFORMIN  (GLUCOPHAGE -XR) 500 MG 24 hr tablet Take 2 tablets (1,000 mg total) by mouth 2 (two) times daily with a meal. 360 tablet 0   Multiple Vitamin (MULTIVITAMIN) tablet Take 1 tablet by mouth daily. (Patient not taking: Reported on 03/05/2024)     niacin  (NIASPAN ) 1000 MG CR tablet Take 500 mg by mouth 2 (two) times daily.     omeprazole  (PRILOSEC) 40 MG capsule Take 1 capsule (40 mg total) by mouth daily. 90 capsule 1   tadalafil  (CIALIS ) 10 MG tablet Take 1-2 tablets (10-20 mg total) by mouth every other day as needed for erectile dysfunction. 90 tablet 1   tadalafil  (CIALIS ) 5 MG tablet Take 1 tablet (5 mg total) by mouth daily. 90 tablet 3   tirzepatide  (MOUNJARO ) 12.5 MG/0.5ML Pen Inject 12.5 mg into the skin once a week. 6 mL 0   valsartan  (DIOVAN ) 320 MG tablet Take 1 tablet (320 mg total) by mouth daily. 90 tablet 1   VITAMIN D -VITAMIN K PO Take 1 tablet by mouth daily.     zinc gluconate 50 MG tablet Take 50 mg by mouth daily.     No current facility-administered medications on file prior to visit.    Review of Systems:  As per HPI- otherwise negative.   Physical Examination: There were no vitals filed for  this visit. There were no vitals  filed for this visit. There is no height or weight on file to calculate BMI. Ideal Body Weight:    Pt observed via mychart video today- he looks his normal self   Assessment and Plan: Right hip pain - Plan: Ambulatory referral to Orthopedic Surgery  Controlled type 2 diabetes mellitus with diabetic neuropathy, without long-term current use of insulin  (HCC) - Plan: metFORMIN  (GLUCOPHAGE -XR) 500 MG 24 hr tablet Pt seen today with concern of significantly worsening hip pain which is limiting his mobility.  Referral to ortho for assessment and possible surgery  Refilled metformin  I provided  rx for rollator and HD parking placard form  Signed Harlene Schroeder, MD

## 2024-03-12 ENCOUNTER — Telehealth: Admitting: Family Medicine

## 2024-03-12 ENCOUNTER — Encounter: Payer: Self-pay | Admitting: Family Medicine

## 2024-03-12 VITALS — Ht 69.0 in

## 2024-03-12 DIAGNOSIS — M25551 Pain in right hip: Secondary | ICD-10-CM

## 2024-03-12 DIAGNOSIS — Z7984 Long term (current) use of oral hypoglycemic drugs: Secondary | ICD-10-CM | POA: Diagnosis not present

## 2024-03-12 DIAGNOSIS — E1140 Type 2 diabetes mellitus with diabetic neuropathy, unspecified: Secondary | ICD-10-CM | POA: Diagnosis not present

## 2024-03-12 MED ORDER — METFORMIN HCL ER 500 MG PO TB24: 1000.0000 mg | ORAL_TABLET | Freq: Two times a day (BID) | ORAL | 0 refills | Status: DC

## 2024-03-19 ENCOUNTER — Ambulatory Visit

## 2024-03-19 ENCOUNTER — Telehealth: Payer: Self-pay

## 2024-03-19 NOTE — Progress Notes (Signed)
 Complex Care Management Note Care Guide Note  03/19/2024 Name: Timothy Chapman MRN: [id number] DOB: 07-20-1946   Complex Care Management Outreach Attempts: An unsuccessful telephone outreach was attempted today to offer the patient information about available complex care management services.  Follow Up Plan:  Additional outreach attempts will be made to offer the patient complex care management information and services.   Encounter Outcome:  No Answer  [facility]  [facility], [facility] Health Care Management Assistant Direct Dial: [phone]  Fax: [phone]

## 2024-03-26 ENCOUNTER — Telehealth: Payer: Self-pay | Admitting: *Deleted

## 2024-03-26 NOTE — Progress Notes (Signed)
 Complex Care Management Note Care Guide Note  03/26/2024 Name: Timothy Chapman MRN: [id number] DOB: 06-Nov-1945   Complex Care Management Outreach Attempts: An unsuccessful telephone outreach was attempted today to offer the patient information about available complex care management services.  Follow Up Plan:  Additional outreach attempts will be made to offer the patient complex care management information and services.   Encounter Outcome:  No Answer  [facility]  Direct [phone] [phone] Website: [email or web address]

## 2024-03-30 ENCOUNTER — Telehealth: Payer: Self-pay | Admitting: *Deleted

## 2024-03-30 NOTE — Progress Notes (Signed)
 Complex Care Management Note Care Guide Note  03/30/2024 Name: WETZEL MEESTER MRN: [id number] DOB: [birth year]   Complex Care Management Outreach Attempts: A second unsuccessful outreach was attempted today to offer the patient with information about available complex care management services.  Follow Up Plan:  Additional outreach attempts will be made to offer the patient complex care management information and services.   Encounter Outcome:  No Answer [facility]  Direct Dial:[phone] Fax:[phone] Website: [email or web address]

## 2024-03-31 ENCOUNTER — Telehealth: Payer: Self-pay | Admitting: *Deleted

## 2024-03-31 NOTE — Progress Notes (Signed)
 Complex Care Management Note Care Guide Note  03/31/2024 Name: Timothy Chapman MRN: [id number] DOB: [birth year]   Complex Care Management Outreach Attempts: A third unsuccessful outreach was attempted today to offer the patient with information about available complex care management services.  Follow Up Plan:  No further outreach attempts will be made at this time. We have been unable to contact the patient to offer or enroll patient in complex care management services.  Encounter Outcome:  No Answer [facility]  Direct Dial:[phone] Fax:[phone] Website: [email or web address]

## 2024-04-01 ENCOUNTER — Telehealth: Payer: Self-pay | Admitting: *Deleted

## 2024-04-01 ENCOUNTER — Encounter: Payer: Self-pay | Admitting: Pharmacist

## 2024-04-01 ENCOUNTER — Ambulatory Visit (INDEPENDENT_AMBULATORY_CARE_PROVIDER_SITE_OTHER): Admitting: Pharmacist

## 2024-04-01 DIAGNOSIS — Z7984 Long term (current) use of oral hypoglycemic drugs: Secondary | ICD-10-CM

## 2024-04-01 DIAGNOSIS — Z7985 Long-term (current) use of injectable non-insulin antidiabetic drugs: Secondary | ICD-10-CM

## 2024-04-01 DIAGNOSIS — I10 Essential (primary) hypertension: Secondary | ICD-10-CM

## 2024-04-01 DIAGNOSIS — E782 Mixed hyperlipidemia: Secondary | ICD-10-CM

## 2024-04-01 DIAGNOSIS — E1140 Type 2 diabetes mellitus with diabetic neuropathy, unspecified: Secondary | ICD-10-CM

## 2024-04-01 MED ORDER — FREESTYLE LIBRE 3 PLUS SENSOR MISC: 1 refills | Status: DC

## 2024-04-01 NOTE — Progress Notes (Signed)
 04/01/2024 Name: Timothy Chapman MRN: [id number] DOB: [birth year]  Chief Complaint  Patient presents with   Diabetes   Hyperlipidemia   Medication Management    Timothy Chapman is a 78 y.o. year old male. [facility] has been referred for Medication Assistance, diabetes, hypertension and hyperlipidemia.   Type 2 Diabetes:  Current Therapy: Mounjaro  12.5mg  weekly - getting thru [place]; Metformin  1000mg  twice a day    He was using Libre 3+ Continuous Glucose Monitor sensors but has not used them since February 2025. He has been using One Touch glucometer but it is hard for him to use the lancing device due to his neuropathy.  He is considering going back to use Continuous Glucose Monitor sensors. There is a prescription on file that he request at anytime thru [facility]  Recent blood glucose - today was 135 but he has had some that were over 200.   Patient reports he has been having a harder time following low CHO / keto diet due to increasing food cost.   Exercise: walking a little. He feels he is a little unsteady and is trying to get a rolling walker but not sure where to get one. He has a prescription that Dr Watt provided to him.   Last office weight was 235 lbs- has lost about 17 lbs since 11/2022 when he started Mounjaro . Starting weight was 252 lbs Starting BMI = 37.2 Current BMI = 34.70  Wt Readings from Last 3 Encounters:  03/05/24 235 lb (106.6 kg)  12/11/23 232 lb (105.2 kg)  10/24/23 239 lb (108.4 kg)    Hyperlipidemia:  Continues to use Repatha  - using [employer] for cost - grant good thru 08/27/2024   Last labs showed elevated triglycerides but have improved - has been as high as 815 in past.    Hypertension: Current therapy: valsartan  320mg  daily  Home blood pressure  136/86 blood pressure at home today.  He reports blood pressure is usually 130 to 140 / 80's but has had some reading in 180's of SBP and 90 for DBP.    BP Readings from Last 3  Encounters:  12/11/23 (!) 198/125  10/24/23 138/83  11/28/22 138/88   Medication Management:  Patient mentions that he had a visit from [facility] in June 2025. Per patient, he received a letter to follow up with his PCP because his liver tests were off. He was also told that Sherleen would be sending a copy of labs and the visit notes to our office. I do not see anything in his Media record regarding these labs or visit.    LFTs in past have been within normal limits   Objective:  Lab Results  Component Value Date   HGBA1C 7.0 (H) 10/24/2023    Lab Results  Component Value Date   CREATININE 1.30 10/24/2023   BUN 25 (H) 10/24/2023   NA 139 10/24/2023   K 4.3 10/24/2023   CL 106 10/24/2023   CO2 23 10/24/2023    Lab Results  Component Value Date   CHOL 111 10/24/2023   HDL 34.80 (L) 10/24/2023   LDLCALC 12 10/24/2023   LDLDIRECT 106.0 08/16/2021   TRIG 320.0 (H) 10/24/2023   CHOLHDL 3 10/24/2023    Medications Reviewed Today     Reviewed by Carla Milling, RPH-CPP (Pharmacist) on 04/01/24 at 1346  Med List Status: <None>   Medication Order Taking? Sig Documenting Provider Last Dose Status Informant  Acetylcarnitine HCl 500 MG CAPS [id number] Yes  Take 500 mg by mouth daily. [provider]  Active Self           Med Note FELIPE, MELISSA B   Thu Nov 21, 2017 12:03 PM)    Alcohol  Swabs  (B-D SINGLE USE SWABS  REGULAR) PADS [id number]  USE PRIOR TO INSULIN  DOSE Copland, Harlene BROCKS, MD  Active   Alpha-Lipoic Acid 600 MG CAPS [id number] Yes Take 600 mg by mouth daily. [provider]  Active Self           Med Note JUSTINO, Ander Wamser B   Tue Apr 04, 2021  1:16 PM)    Ashwagandha 500 MG CAPS [id number] Yes Take 1,000 mg by mouth at bedtime. [provider]  Active   aspirin  81 MG chewable tablet [id number] Yes Chew 1 tablet (81 mg total) by mouth daily. Jillian Buttery, MD  Active Self  b complex vitamins capsule [id number] Yes Take 1 capsule by mouth daily.  [provider]  Active   Continuous Glucose Sensor (FREESTYLE LIBRE 3 PLUS SENSOR) MISC [id number]  Use to check blood glucose continuously. Change sensor every 15 days.  Patient not taking: Reported on 04/01/2024   Copland, Harlene BROCKS, MD  Active   diclofenac  (VOLTAREN ) 75 MG EC tablet [id number] Yes TAKE 1 TABLET TWICE A DAY Copland, Jessica C, MD  Active   Evolocumab  (REPATHA  SURECLICK) 140 MG/ML SOAJ [id number] Yes Inject 140 mg into the skin every 14 (fourteen) days. Copland, Harlene BROCKS, MD  Active   famotidine  (PEPCID ) 20 MG tablet [id number] Yes Take 1 tablet (20 mg total) by mouth 2 (two) times daily. Copland, Harlene BROCKS, MD  Active   glucose blood [facility] VERIO) test strip [id number] Yes USE TO CHECK BLOOD GLUCOSE TWO TIMES A DAY Copland, Harlene BROCKS, MD  Active    Patient not taking:   Discontinued 04/01/24 1346 (Patient Preference)   Lancets (ONETOUCH DELICA PLUS LANCET30G) MISC [id number] Yes USE TWO TIMES A DAY TO     CHECK BLOOD SUGAR Copland, Harlene BROCKS, MD  Active   melatonin 5 MG TABS [id number] Yes Take 5 mg by mouth at bedtime.  [provider]  Active Self  metFORMIN  (GLUCOPHAGE -XR) 500 MG 24 hr tablet [id number] Yes Take 2 tablets (1,000 mg total) by mouth 2 (two) times daily with a meal. Copland, Harlene BROCKS, MD  Active   Multiple Vitamin (MULTIVITAMIN) tablet [id number]  Take 1 tablet by mouth daily.  Patient not taking: Reported on 04/01/2024   [provider]  Active Self  niacin  (NIASPAN ) 1000 MG CR tablet [id number] Yes Take 500 mg by mouth 2 (two) times daily. [provider]  Active            Med Note JUSTINO, MADELIN KATHEE Schaumann Nov 23, 2021  2:06 PM) Patient is taking over-the-counter niacin   omeprazole  (PRILOSEC) 40 MG capsule [id number] Yes Take 1 capsule (40 mg total) by mouth daily. Copland, Harlene BROCKS, MD  Active   tadalafil  (CIALIS ) 10 MG tablet [id number] Yes Take 1-2 tablets (10-20 mg total) by mouth every other day as needed for erectile dysfunction.  Copland, Harlene BROCKS, MD  Active   tadalafil  (CIALIS ) 5 MG tablet [id number] Yes Take 1 tablet (5 mg total) by mouth daily. Copland, Harlene BROCKS, MD  Active   tirzepatide  (MOUNJARO ) 12.5 MG/0.5ML Pen [id number] Yes Inject 12.5 mg into the skin once a week. Copland, Harlene BROCKS, MD  Active            Med Note (  CARLA MILLING B   Mon Apr 29, 2023  1:40 PM) Getting from [place]  valsartan  (DIOVAN ) 320 MG tablet [id number] Yes Take 1 tablet (320 mg total) by mouth daily. Copland, Harlene BROCKS, MD  Active   VITAMIN D -VITAMIN K PO [id number] Yes Take 1 tablet by mouth daily. [provider]  Active   zinc gluconate 50 MG tablet [id number] Yes Take 50 mg by mouth daily. [provider]  Active                Assessment/Plan:  Type 2 DM - he has had some readings that were > 200 but mostly has been in recommended range. Last A1c was 7.0% but is due to have rechecked.  Continue current medications for diabetes - Mounjaro  12.5mg  weekly, metformin . Recommended he [facility] with [facility] about possibly increasing Mounjaro  to 15mg  weekly to help with postprandial highs.  Sent in Rx for [place] 3 + sensors to [facility] at patient request - 30 day supply Discussed restarting low CHO / keto diet.  Provided names of 2 pharmacies in [place] area that provide rolling walkers.   Hypertension - last office blood pressure in our office was at goal but other blood pressure reading have been high.  Continue valsartan  320mg  daily Discussed starting either amlodipine  2.5mg  or hydrochlorothiazide  25mg  but patient declined currently.  Reviewed blood pressure goal Patient to check blood pressure 2 to 3 times per week - call office if blood pressure > 140/90  Hyperlipidemia - LDL at goal; Tg were elevated ; HDLC still below goal of > 40.  Continue Repatha  every 14 days - enrolled in [facility] - good thru 08/28/2024 Reviewed foods that increase triglycerides. Patient will try to limit intake  Medicaton  Management:  Called [employer] and requested copy of home visit and labs from their visits.They are sending to [phone] - might take up to 72 hours to received per [employer] representative (Phone # called [phone]) Advised patient to hold ashwagandha since it can affect liver until we receive labs from [facility] home visit and confirm if his LFTs were elevated. Might need to consider evaluation for fatty liver if LFTs were elevated.   Meds ordered this encounter  Medications   Continuous Glucose Sensor (FREESTYLE LIBRE 3 PLUS SENSOR) MISC    Sig: Use to check blood glucose continuously. Change sensor every 15 days.    Dispense:  2 each    Refill:  1    Profile - patient will call when he wants these     Follow Up Plan: Will see Dr Watt in 1 month, I will follow up with him in 2 months.   MILLING CARLA, PharmD Clinical Pharmacist [facility]

## 2024-04-01 NOTE — Progress Notes (Signed)
 Complex Care Management Note Care Guide Note  04/01/2024 Name: LYDIA MENG MRN: [id number] DOB: 1946-01-24  Derryck Shahan Machi is a 78 y.o. year old male who is a primary care patient of Copland, Harlene BROCKS, MD . The community resource team was consulted for assistance with Transportation Needs   SDOH screenings and interventions completed:  Yes     SDOH Interventions Today    Flowsheet Row Most Recent Value  SDOH Interventions   Transportation Interventions Community Resources Provided     Care guide performed the following interventions: Patient provided with information about care guide support team and interviewed to confirm resource needs.  Follow Up Plan:  No further follow up planned at this time. The patient has been provided with needed resources.  Encounter Outcome:  Patient Visit Completed Melitza Metheny [place]Moran  [facility] Health[facility] Health Care Guide  Direct [phone] Fax:[phone] Website: [email or web address]

## 2024-04-01 NOTE — Patient Instructions (Addendum)
 [facility] [address]   PH: [phone] HOURS:   Mon-Fri: 8:30am-6:30pm  Sat:  9:00am - 1:00pm CLOSED Sunday  [facility] [address] [place], [place] [address]  PH: [phone]  MON-FRI 8AM-6PM,  SAT 9 AM-1PM

## 2024-04-06 NOTE — Progress Notes (Signed)
 Complex Care Management Note  Care Guide Note 04/06/2024 Name: BUSH MURDOCH MRN: [id number] DOB: 1946-02-22  Timotheus Salm Demery is a 78 y.o. year old male who sees Copland, Harlene BROCKS, MD for primary care. I reached out to [employer] by phone today to offer complex care management services.  Mr. Kessenich was given information about Complex Care Management services today including:   The Complex Care Management services include support from the care team which includes your Nurse Care Manager, Clinical Social Worker, or Pharmacist.  The Complex Care Management team is here to help remove barriers to the health concerns and goals most important to you. Complex Care Management services are voluntary, and the patient may decline or stop services at any time by request to their care team member.   Complex Care Management Consent Status: Patient agreed to services and verbal consent obtained.   Encounter Outcome:  Patient denied RNCM follow up.  Dreama [facility]  [facility], [facility] Health Care Management Assistant Direct Dial: [phone]  Fax: [phone]

## 2024-04-09 LAB — LIPID PANEL
Cholesterol: 101 (ref 0–200)
HDL: 31 — AB (ref 35–70)
LDL Cholesterol: 54
Triglycerides: 205 — AB (ref 40–160)

## 2024-04-09 LAB — HEMOGLOBIN A1C: Hemoglobin A1C: 8

## 2024-04-09 LAB — MICROALBUMIN / CREATININE URINE RATIO: Microalb Creat Ratio: 175.9

## 2024-04-09 LAB — PROTEIN / CREATININE RATIO, URINE
Albumin, U: 17.8
Creatinine, Urine: 101.2

## 2024-04-17 ENCOUNTER — Other Ambulatory Visit: Payer: Self-pay | Admitting: Family Medicine

## 2024-04-17 ENCOUNTER — Telehealth: Payer: Self-pay | Admitting: Pharmacist

## 2024-04-17 DIAGNOSIS — M2550 Pain in unspecified joint: Secondary | ICD-10-CM

## 2024-04-17 DIAGNOSIS — E1140 Type 2 diabetes mellitus with diabetic neuropathy, unspecified: Secondary | ICD-10-CM

## 2024-04-17 NOTE — Telephone Encounter (Signed)
 We have not received visit notes and labs from [employer] visit. Requested 04/01/2024 during patient visit with Clinical Pharmacist Practitioner. Called [employer] again to request they fax to [phone].

## 2024-04-22 ENCOUNTER — Other Ambulatory Visit (INDEPENDENT_AMBULATORY_CARE_PROVIDER_SITE_OTHER): Admitting: Pharmacist

## 2024-04-22 DIAGNOSIS — E119 Type 2 diabetes mellitus without complications: Secondary | ICD-10-CM

## 2024-04-22 NOTE — Progress Notes (Signed)
 04/22/2024 Name: Timothy Chapman MRN: [id number] DOB: [birth year]  Chief Complaint  Patient presents with   Diabetes   Medication Management   Hyperlipidemia    Timothy Chapman is a 78 y.o. year old male. [place] has been referred for Medication Assistance, diabetes, hypertension and hyperlipidemia.   Type 2 Diabetes:  Noted complications - retinopathy, neuropathy, nephropathy  Current Therapy: Mounjaro  12.5mg  weekly - getting thru [place]; Metformin  1000mg  twice a day   Past Thereapies Tried: Farxiga  - caused genital infection / irritation; glipizide  - caused hypoglycemia, Victoza  - did not get A1c / blood glucose to goals; Ozempic  - did not get A1c / blood glucose to goals  His last A1c was the [place] was 8.0% on 04/09/2024. He states he discussed with his PCP at the [place] about increasing Mounjaro  from 12.5mg  weekly to 15mg  weekly but dose has not been changed yet. Last dispense date for Mounjaro  at [facility] was 04/09/2024   He has restarted using Libre 3+ Continuous Glucose Monitor sensors on 04/14/2024.  Reviewed Continuous Glucose Monitor report today.  CGM Documentation: 04/14/2024 to 04/22/2024 Days Worn: 9 (recommend 14 days) % Time CGM is active: 61% (goal >=70%) Average Glucose: 163 mg/dL Glucose Management Indicator: 7.2% Glucose Variability: 16.4% (goal <36%) Time in Range:  - Time above range >250: 0% (typical goal: <5%) - Time above range 181-250: 20% (typical goal <20%) - Time in range 70-180 80% (typical goal >=70%) - Time below range 54-69: 0% (typical goal <4%) - Time below range: 0% (typical goal <1%)  Glucose trends: Overall it appears blood glucose has improved over the last week with GMI of 7.2% compared to A1c from [place] of 8.0%. Patient is still having some post prandial highs.       Exercise: walking a little. He feels he is a little unsteady and is trying to get a rolling walker but not sure where to get one. He has a prescription that Dr Watt provided to  him.  Patient reports he has had increase in neuropathy symptoms recently. He is only taking over-the-counter treatment for neuropathy - B Complex, Alpha Lipoic Acid and Tumeric.   Last vitamin B12 and Folate Lab Results  Component Value Date   VITAMINB12 1,438 (H) 11/21/2022   FOLATE >24.4 04/26/2021    Last office weight was 235 lbs- has lost about 17 lbs since 11/2022 when he started Mounjaro . Starting weight was 252 lbs Starting BMI = 37.2 Current BMI = 34.70  Wt Readings from Last 3 Encounters:  03/05/24 235 lb (106.6 kg)  12/11/23 232 lb (105.2 kg)  10/24/23 239 lb (108.4 kg)    CKD - G3a / A2: Urine albumin / Creatinine Ratio = 175.9 mg/g = 175.9 (04/09/2024) Last eGFR was 53.84  Currently taking valsartan  320mg  daily for hypertension; has tried Farxiga  in past for diabetes but stopped due to genital infections / irritation. He has declined to take Jardiance in past due to concerns of having similar side effect as Farxiga .   Hyperlipidemia:  Continues to use Repatha  - using [employer] for cost - grant good thru 08/27/2024   Patient noted to be intolerant to statins - caused muscle aches and cramps.  Past statins tried - rosuvastatin and atorvastatin and both cause muscle pain / cramps  Lipid panel checked at [facility] 04/09/2024 showed slightly elevated triglycerides but have improved - has been as high as 815 in past. LDL was at goal.     Medication Management:  At our last visit  in July, patient mentioned that he had a visit from [facility] in June 2025. Per patient, he received a letter to follow up with his PCP because his liver tests were off. I requested a copy of labs and office visti twice form [facility] but I have not received fax yet even thought twice they said they would fax to [phone] - main office fax.  Patient was not able to find his copy of this visit / letter.  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   Objective:  Lab Results  Component Value Date   HGBA1C 8.0 04/09/2024    Lab Results  Component Value Date   CREATININE 1.30 10/24/2023   BUN 25 (H) 10/24/2023   NA 139 10/24/2023   K 4.3 10/24/2023   CL 106 10/24/2023   CO2 23 10/24/2023    Lab Results  Component Value Date   CHOL 101 04/09/2024   HDL 31 (A) 04/09/2024   LDLCALC 54 04/09/2024   LDLDIRECT 106.0 08/16/2021   TRIG 205 (A) 04/09/2024   CHOLHDL 3 10/24/2023    Medications Reviewed Today     Reviewed by Timothy Chapman, RPH-CPP (Pharmacist) on 04/22/24 at 0908  Med List Status: <None>   Medication Order Taking? Sig Documenting Provider Last Dose Status Informant  Acetylcarnitine HCl 500 MG CAPS [id number] Yes Take 500 mg by mouth daily. [provider]  Active Self           Med Note FELIPE, MELISSA B   Thu Nov 21, 2017 12:03 PM)    Alcohol  Swabs  (B-D SINGLE USE SWABS  REGULAR) PADS [id number]  USE PRIOR TO INSULIN  DOSE Copland, Harlene BROCKS, MD  Active   Alpha-Lipoic Acid 600 MG CAPS [id number] Yes Take 600 mg by mouth daily. [provider]  Active Self           Med Note JUSTINO, Chapman B   Tue Apr 04, 2021  1:16 PM)     Patient not taking:   Discontinued 04/22/24 0907 (Discontinued by provider)   aspirin  81 MG chewable tablet [id number] Yes Chew 1 tablet (81 mg total) by mouth daily. Jillian Buttery, MD  Active Self  b complex vitamins capsule [id number] Yes Take 1 capsule by mouth daily. [provider]  Active   Continuous Glucose Sensor (FREESTYLE LIBRE 3 PLUS SENSOR) MISC [id number] Yes Use to check blood glucose continuously. Change sensor every 15 days. Copland, Harlene BROCKS, MD  Active   diclofenac  (VOLTAREN ) 75 MG EC tablet [id number] Yes TAKE 1 TABLET TWICE A DAY Copland, Harlene BROCKS, MD  Active   Evolocumab  (REPATHA  SURECLICK) 140 MG/ML SOAJ [id number] Yes Inject 140 mg into the skin every 14 (fourteen) days. Copland, Harlene BROCKS, MD  Active    famotidine  (PEPCID ) 20 MG tablet [id number] Yes Take 1 tablet (20 mg total) by mouth 2 (two) times daily. Copland, Harlene BROCKS, MD  Active   glucose blood (ONETOUCH VERIO) test strip [id number]  USE TO CHECK BLOOD GLUCOSE TWO TIMES A DAY Copland, Harlene BROCKS, MD  Active   Lancets [facility] DELICA PLUS [place]) MISC [id number]  USE TWO TIMES A DAY TO     CHECK BLOOD SUGAR Copland, Harlene BROCKS, MD  Active   melatonin 5 MG TABS [id number]  Take 5 mg by mouth at bedtime.  [provider]  Active Self  metFORMIN  (GLUCOPHAGE -XR) 500 MG 24 hr tablet [id number] Yes TAKE  2 TABLETS 2 TIMES     DAILY WITH MEALS Copland, Jessica C, MD  Active   Multiple Vitamin (MULTIVITAMIN) tablet [id number]  Take 1 tablet by mouth daily.  Patient not taking: Reported on 04/22/2024   [provider]  Active Self  niacin  (NIASPAN ) 1000 MG CR tablet [id number] Yes Take 500 mg by mouth 2 (two) times daily. [provider]  Active            Med Note JUSTINO, MADELIN KATHEE Schaumann Nov 23, 2021  2:06 PM) Patient is taking over-the-counter niacin   omeprazole  (PRILOSEC) 40 MG capsule [id number] Yes Take 1 capsule (40 mg total) by mouth daily. Copland, Harlene BROCKS, MD  Active   OVER THE COUNTER MEDICATION [id number] Yes Take 600 mg by mouth in the morning and at bedtime. NAD Plus (nicotinamide adenine dinucleotide) [provider]  Active   tadalafil  (CIALIS ) 10 MG tablet [id number] Yes Take 1-2 tablets (10-20 mg total) by mouth every other day as needed for erectile dysfunction. Copland, Harlene BROCKS, MD  Active   tadalafil  (CIALIS ) 5 MG tablet [id number] Yes Take 1 tablet (5 mg total) by mouth daily. Copland, Harlene BROCKS, MD  Active   tirzepatide  (MOUNJARO ) 12.5 MG/0.5ML Pen [id number] Yes Inject 12.5 mg into the skin once a week. Copland, Harlene BROCKS, MD  Active            Med Note JUSTINO, [place] B   Mon Apr 29, 2023  1:40 PM) Getting from [place]  valsartan  (DIOVAN ) 320 MG tablet [id number] Yes Take 1 tablet (320 mg total) by mouth  daily. Copland, Harlene BROCKS, MD  Active   VITAMIN D -VITAMIN K PO [id number] Yes Take 1 tablet by mouth daily. [provider]  Active   zinc gluconate 50 MG tablet [id number] Yes Take 50 mg by mouth daily. [provider]  Active                Assessment/Plan:  Type 2 DM - he has had some readings that were > 200 but mostly has been in recommended range. [facility] A1c at [facility] was not at goal -Since he gets Mounjaro  thru [place] clinic, recommended he reach out to his PCP at the [place] to request increase in Mounjaro  dose to 15mg  weekly. -He could get Mounjaro  thru his [facility] plan. Mounjaro  is tier 3 / preferred brand but his copay for preferred brand is 25% of medication cost which would be about $250 / month.  It is more cost effective for him to continue to get Mounjaro  thru the [place].  - Continue metformin  ER 500mg  - take 2 tablets twice a day - Continue to use Libre 3+ sensor to check blood glucose. Reviewed report with patient  CKD - G3a / A2 - did not tolerate Farxiga  in past. He is on max dose of ARB.  - consider trial of Jardiance. I suspect previous experience with Farxiga  could have been related to uncontrolled blood glucose which is blood glucose per Continuous Glucose Monitor report is much better than in the past.  - [name] is also a possibility to decrease CKD progression or referral to nephrology but is not on [place] formulary but could get exception if all the following criteria is met:  (1)Type 2 diabetes mellitus, (2) Chronic kidney disease diagnosis with estimated glomerular filtration rate >= 25 ml/min/1.71m2AND persistent albuminuria with urinary albumin-to-creatinine ratio >= 30 mg/g (3)Receiving treatment containing maximumtolerated labeled dose of an angiotensin-converting enzyme inhibitor (ACEI)  or angiotensin  receptor blocker (ARB) or unable to use an ACEI or ARB (4) Receiving treatment with a sodium-glucose cotransporter-2 (SGLT2) inhibitor or unable to use an  SGLT2 inhibitor. Cost of [name] on his [place] plan would be 25% medication cost as coapy.  - Will send message to PCP about CKD treatment options to discuss at visit 04/27/2024  Hyperlipidemia - LDL at goal; Tg were slightly elevated but improved; HDLC still below goal of > 40.  History of statin intolerance due to muscle pain Continue Repatha  every 14 days - enrolled in [facility] - good thru 08/28/2024\ Place note in appointment notes to consider statin exclusion / diagnosis code since patient has history of statin myopathy / intolerance.   Medicaton Management:  Continue to avoid ashwagandha until we rechecked his LFTs.   Abstracted [facility] 04/09/2024   Follow Up Plan: Will see Dr Watt 04/27/2024; I have follow up scheduled for 06/03/2024  Madelin Ray, PharmD Clinical Pharmacist [facility]

## 2024-04-22 NOTE — Telephone Encounter (Signed)
 Checked office faxed again and did not see that notes and labs were sent to our office. I have requested twice. Recommended patient request copy or bring his copy to our office for review. Closing out telephone note.

## 2024-04-22 NOTE — Progress Notes (Unsigned)
 [facility] [address] [place], [place] [address] [phone]  Date:  04/27/2024   Name:  Timothy Chapman [facility]   DOB:  Feb 09, 1946   MRN:  [id number]  PCP:  Timothy Harlene BROCKS, MD    Chief Complaint: No chief complaint on file.   History of Present Illness:  Timothy Chapman is a 78 y.o. very pleasant male patient who presents with the following:  Patient seen today for a follow-up visit.  I last saw Timothy Chapman virtually in June for hip pain History of diabetes, hypertension, chronic back pain, severe illness in 2019-he became ill with sepsis due to cholangitis, had acute respiratory failure and required intubation He receives some care through the [facility] History of prostate cancer currently on active surveillance- he just had an MRI earlier this year which was reassuring Married to Timothy Chapman, they have a toddler son - Timothy Chapman is 3 yrs now!   He was recently seen by our pharmacist Timothy Chapman who made some recommendations regarding his chronic kidney disease and diabetes as below.  Specifically we are considering starting him on Kerendia but cost may be an issue. Discussed this with him today Type 2 DM - he has had some readings that were > 200 but mostly has been in recommended range. Last A1c at [facility] was not at goal -Since he gets Mounjaro  thru [facility], recommended he reach out to his PCP at the [facility] to request increase in Mounjaro  dose to 15mg  weekly. -He could get Mounjaro  thru his [facility] plan. Mounjaro  is tier 3 / preferred brand but his copay for preferred brand is 25% of medication cost which would be about $250 / month.  It is more cost effective for him to continue to get Mounjaro  thru the [facility].  - Continue metformin  ER 500mg  - take 2 tablets twice a day - Continue to use Libre 3+ sensor to check blood glucose. Reviewed report with patient CKD - G3a / A2 - did not tolerate Farxiga  in past. He is on max dose of ARB.  - consider trial  of Jardiance. I suspect previous experience with Farxiga  could have been related to uncontrolled blood glucose which is blood glucose per Continuous Glucose Monitor report is much better than in the past.  - Timothy Chapman is also a possibility to decrease CKD progression or referral to nephrology but is not on [facility] formulary but could get exception if all the following criteria is met:  (1)Type 2 diabetes mellitus, (2) Chronic kidney disease diagnosis with estimated glomerular filtration rate >= 25 ml/min/1.35m2AND persistent albuminuria with urinary albumin-to-creatinine ratio >= 30 mg/g (3)Receiving treatment containing maximumtolerated labeled dose of an angiotensin-converting enzyme inhibitor (ACEI)  or angiotensin receptor blocker (ARB) or unable to use an ACEI or ARB (4) Receiving treatment with a sodium-glucose cotransporter-2 (SGLT2) inhibitor or unable to use an SGLT2 inhibitor. Cost of Timothy Chapman on his [employer] plan would be 25% medication cost as coapy.  - Will send message to PCP about CKD treatment options to discuss at visit 04/27/2024 Hyperlipidemia - LDL at goal; Tg were slightly elevated but improved; HDLC still below goal of > 40.  History of statin intolerance due to muscle pain Continue Repatha  every 14 days - enrolled in [facility] - good thru 08/28/2024\ Place note in appointment notes to consider statin exclusion / diagnosis code since patient has history of statin myopathy / intolerance.  Medicaton Management:  Continue to avoid ashwagandha until  we rechecked his LFTs.   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   Depression 06/25/2022   Diabetes mellitus treated with injections of non-insulin  medication (HCC) 06/25/2022   Neuromuscular disorder (HCC) 06/25/2022   PONV (postoperative nausea and vomiting) 06/25/2022   Sleep apnea 06/25/2022   Branch retinal vein occlusion 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/25/2022   Tributary (branch) retinal vein occlusion, left eye, 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/25/2022   History of adenomatous polyp of colon 06/25/2022   Metabolic syndrome 06/25/2022   Pain in joint, pelvic region and thigh 06/25/2022   Diabetes mellitus with neuropathy (HCC) 06/25/2022   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   Malignant neoplasm of prostate (HCC) 03/25/2021   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 esophagitis 01/17/2021   Erectile dysfunction 01/17/2021   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   Paroxysmal atrial fibrillation (HCC) 12/16/2017   Retinal edema 12/13/2017   SBO (small bowel obstruction) (HCC) 10/08/2017   ATN (acute tubular necrosis) (HCC) 10/08/2017   Septic shock (HCC) 10/04/2017   Acute cholecystitis s/p perc cholecystomy drainage 10/04/2017    Acute respiratory failure with hypoxia (HCC)    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   Myopia of both eyes 03/21/2016   Nuclear sclerotic cataract of both eyes 03/21/2016   Presbyopia 03/21/2016   Regular astigmatism of both eyes 03/21/2016   Type 2 diabetes mellitus with left eye affected by mild nonproliferative retinopathy without macular edema, without long-term current use of insulin  (HCC) 03/21/2016   Chronic back pain 02/08/2016   Controlled type 2 diabetes mellitus with diabetic neuropathy, without long-term current use of insulin  (HCC) 02/08/2016   Insomnia 02/08/2016   History of diverticulitis 02/08/2016   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   Type 2 diabetes mellitus with diabetic polyneuropathy, without long-term current use of insulin  (HCC) 10/06/2015   Elevated PSA 09/26/2015    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   Encounter for rehabilitation 04/25/2015   ABNORMAL ELECTROCARDIOGRAM 03/12/2009    Past Medical History:  Diagnosis Date   ABNORMAL ELECTROCARDIOGRAM 03/12/2009   Qualifier: Diagnosis of  By: Pauline MD, Stephen     Acute cholecystitis s/p perc cholecystomy drainage 10/04/2017    Acute respiratory failure with hypoxia (HCC)    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   Cannot tolerate statins   Elevated PSA 09/26/2015   Encounter for rehabilitation 04/25/2015   Erectile dysfunction 01/17/2021   GERD (gastroesophageal  reflux disease)    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 esophagitis 01/17/2021   History of adenomatous polyp of colon 06/25/2022   Sep 28, 2015 Entered By: WELBY AURELIANO HERO Comment: 2000 polyp; +++; 2012 tics; *Surveillance 2017Jan 11, 2017 Entered By: WELBY AURELIANO HERO Comment: MGM=CRC age 40   History of diverticulitis 02/08/2016   HYPERTENSION, BENIGN ESSENTIAL, UNCONTROLLED 03/12/2009   Qualifier: Diagnosis of  By: Pauline MD, Stephen     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   Insomnia 02/08/2016   Malignant neoplasm of prostate (HCC) 03/25/2021   Metabolic syndrome 06/25/2022   Myopia of both eyes 03/21/2016   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   Feb 19, 2020 Entered By: [email or web address],[email or web address] Comment: statin induced   Neuromuscular disorder (HCC)     neuropathy but being treated   Nuclear sclerotic cataract of both eyes 03/21/2016   Pain in joint, pelvic region and thigh 06/25/2022   Paroxysmal atrial fibrillation (HCC) 12/16/2017   PONV (postoperative nausea and vomiting)    Presbyopia 03/21/2016   Regular astigmatism of both eyes 03/21/2016   Retinal edema 12/13/2017   SBO (small bowel obstruction) (HCC) 10/08/2017   Septic shock (HCC) 10/04/2017   Sleep apnea    has a sleep study but refuses that he has sleep study, not wearing CPAP   Tributary (branch) retinal vein occlusion, left eye, 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   REPLACEMENT TOTAL KNEE Left    ROTATOR CUFF REPAIR     Bil    Social History   Tobacco Use   Smoking status: Former    Current packs/day: 0.00    Types: Cigarettes    Quit date: 09/18/1975    Years since quitting: 48.6   Smokeless tobacco: Never  Vaping Use   Vaping status: Never Used  Substance Use Topics   Alcohol  use: Not Currently    Alcohol /week: 0.0 standard drinks of alcohol     Comment: Rarely   Drug use: Never    Family History  Problem Relation Age of Onset   Heart failure Father    Heart disease Father    Emphysema Mother    Colon cancer Maternal Grandmother     Allergies  Allergen Reactions   Codeine     hallucinations   Farxiga  [Dapagliflozin ] Other (See Comments)    Genital infection and irritation   Statins     Has tried several cannot tolerate    Medication list has been reviewed and updated.  Current Outpatient Medications  on File Prior to Visit  Medication Sig Dispense Refill   Acetylcarnitine HCl 500 MG CAPS Take 500 mg by mouth daily.     Alcohol  Swabs  (B-D SINGLE USE SWABS  REGULAR) PADS USE PRIOR TO INSULIN  DOSE 200 each 7   Alpha-Lipoic Acid 600 MG CAPS Take 600 mg by mouth daily.     aspirin  81 MG chewable tablet Chew 1 tablet (81 mg total) by mouth daily. 30 tablet 0   b complex vitamins capsule Take 1 capsule by mouth daily.  Continuous Glucose Sensor (FREESTYLE LIBRE 3 PLUS SENSOR) MISC Use to check blood glucose continuously. Change sensor every 15 days. 2 each 1   diclofenac  (VOLTAREN ) 75 MG EC tablet TAKE 1 TABLET TWICE A DAY 180 tablet 0   Evolocumab  (REPATHA  SURECLICK) 140 MG/ML SOAJ Inject 140 mg into the skin every 14 (fourteen) days. 2 mL 5   famotidine  (PEPCID ) 20 MG tablet Take 1 tablet (20 mg total) by mouth 2 (two) times daily. 180 tablet 1   glucose blood (ONETOUCH VERIO) test strip USE TO CHECK BLOOD GLUCOSE TWO TIMES A DAY 200 strip 6   Lancets (ONETOUCH DELICA PLUS LANCET30G) MISC USE TWO TIMES A DAY TO     CHECK BLOOD SUGAR 100 each 3   melatonin 5 MG TABS Take 5 mg by mouth at bedtime.      metFORMIN  (GLUCOPHAGE -XR) 500 MG 24 hr tablet TAKE 2 TABLETS 2 TIMES     DAILY WITH MEALS 360 tablet 0   Multiple Vitamin (MULTIVITAMIN) tablet Take 1 tablet by mouth daily. (Patient not taking: Reported on 04/22/2024)     niacin  (NIASPAN ) 1000 MG CR tablet Take 500 mg by mouth 2 (two) times daily.     omeprazole  (PRILOSEC) 40 MG capsule Take 1 capsule (40 mg total) by mouth daily. 90 capsule 1   OVER THE COUNTER MEDICATION Take 600 mg by mouth in the morning and at bedtime. NAD Plus (nicotinamide adenine dinucleotide)     tadalafil  (CIALIS ) 10 MG tablet Take 1-2 tablets (10-20 mg total) by mouth every other day as needed for erectile dysfunction. 90 tablet 1   tadalafil  (CIALIS ) 5 MG tablet Take 1 tablet (5 mg total) by mouth daily. 90 tablet 3   tirzepatide  (MOUNJARO ) 12.5 MG/0.5ML Pen Inject 12.5  mg into the skin once a week. 6 mL 0   valsartan  (DIOVAN ) 320 MG tablet Take 1 tablet (320 mg total) by mouth daily. 90 tablet 1   VITAMIN D -VITAMIN K PO Take 1 tablet by mouth daily.     zinc gluconate 50 MG tablet Take 50 mg by mouth daily.     No current facility-administered medications on file prior to visit.    Review of Systems:  As per HPI- otherwise negative.   Physical Examination: There were no vitals filed for this visit. There were no vitals filed for this visit. There is no height or weight on file to calculate BMI. Ideal Body Weight:    GEN: no acute distress. HEENT: Atraumatic, Normocephalic.  Ears and Nose: No external deformity. CV: RRR, No M/G/R. No JVD. No thrill. No extra heart sounds. PULM: CTA B, no wheezes, crackles, rhonchi. No retractions. No resp. distress. No accessory muscle use. ABD: S, NT, ND, +BS. No rebound. No HSM. EXTR: No c/c/e PSYCH: Normally interactive. Conversant.  Needs update foot exam  Assessment and Plan: ***  Signed Harlene Schroeder, MD

## 2024-04-25 NOTE — Patient Instructions (Incomplete)
 It was good to see you today.  Recommend flu shot and COVID booster this fall As we discussed your BP is slightly high; let's see if you can lose a few lbs. If we are not able to improve your BP I would like to add a clonidine patch for you   Recommend one dose of RSV vaccine if not done already Please see me in about 6 months assuming all is well!

## 2024-04-27 ENCOUNTER — Encounter: Payer: Self-pay | Admitting: Family Medicine

## 2024-04-27 ENCOUNTER — Ambulatory Visit (INDEPENDENT_AMBULATORY_CARE_PROVIDER_SITE_OTHER): Admitting: Family Medicine

## 2024-04-27 VITALS — BP 144/80 | HR 79 | Resp 16 | Ht 69.0 in | Wt 238.0 lb

## 2024-04-27 DIAGNOSIS — I10 Essential (primary) hypertension: Secondary | ICD-10-CM | POA: Diagnosis not present

## 2024-04-27 DIAGNOSIS — E782 Mixed hyperlipidemia: Secondary | ICD-10-CM

## 2024-04-27 DIAGNOSIS — Z7984 Long term (current) use of oral hypoglycemic drugs: Secondary | ICD-10-CM | POA: Diagnosis not present

## 2024-04-27 DIAGNOSIS — Z7985 Long-term (current) use of injectable non-insulin antidiabetic drugs: Secondary | ICD-10-CM | POA: Diagnosis not present

## 2024-04-27 DIAGNOSIS — Z789 Other specified health status: Secondary | ICD-10-CM

## 2024-04-27 DIAGNOSIS — E1140 Type 2 diabetes mellitus with diabetic neuropathy, unspecified: Secondary | ICD-10-CM

## 2024-04-30 ENCOUNTER — Ambulatory Visit: Admitting: Urology

## 2024-04-30 ENCOUNTER — Encounter: Payer: Self-pay | Admitting: Urology

## 2024-04-30 VITALS — BP 186/113 | HR 71 | Ht 68.0 in | Wt 237.0 lb

## 2024-04-30 DIAGNOSIS — N138 Other obstructive and reflux uropathy: Secondary | ICD-10-CM

## 2024-04-30 DIAGNOSIS — E291 Testicular hypofunction: Secondary | ICD-10-CM | POA: Diagnosis not present

## 2024-04-30 DIAGNOSIS — Z3009 Encounter for other general counseling and advice on contraception: Secondary | ICD-10-CM

## 2024-04-30 DIAGNOSIS — N401 Enlarged prostate with lower urinary tract symptoms: Secondary | ICD-10-CM

## 2024-04-30 DIAGNOSIS — R972 Elevated prostate specific antigen [PSA]: Secondary | ICD-10-CM | POA: Diagnosis not present

## 2024-04-30 LAB — URINALYSIS, ROUTINE W REFLEX MICROSCOPIC
Bilirubin, UA: NEGATIVE
Glucose, UA: NEGATIVE
Ketones, UA: NEGATIVE
Nitrite, UA: POSITIVE — AB
Specific Gravity, UA: 1.025 (ref 1.005–1.030)
Urobilinogen, Ur: 0.2 mg/dL (ref 0.2–1.0)
pH, UA: 5.5 (ref 5.0–7.5)

## 2024-04-30 LAB — MICROSCOPIC EXAMINATION

## 2024-04-30 MED ORDER — ALPRAZOLAM 1 MG PO TABS: ORAL_TABLET | ORAL | 0 refills | Status: DC

## 2024-04-30 NOTE — Progress Notes (Signed)
 Assessment: 1. Elevated PSA - prior biopsies showing ASAP/low grade prostate cancer?; MRI negative   2. BPH with obstruction/lower urinary tract symptoms   3. Hypogonadism in male   4. Encounter for vasectomy assessment     Plan: PHI today Continue tadalafil .  Schedule for vasectomy per patient request. Prescription for alprazolam  sent to his pharmacy.  Chief Complaint:  Chief Complaint  Patient presents with   Elevated PSA    History of Present Illness:  Timothy Chapman is a 78 y.o. male who is seen for continued evaluation of elevated PSA, ASAP on prostate biopsy in 2013, BPH with LUTS, and hypogonadism.  Elevated PSA/ASAP on biopsy: He has a long history of elevated PSAs and has previously undergone prostate biopsy.  A prostate biopsy from 10/13 showed ASAP in the right lateral apex.  Prostate volume measured 78 mL.  PSA results: 1/16 4.1 1/17 5.57 8/17 5.25 12/18 8.86 9/20 6.75 10/21 8.02 10/22 8.95 1/23 8.72 11/23 9.48 2/25 10.48  MRI of the prostate from 7/19 showed a PI-RADS 3 lesion in the left apical zone; prostate volume 90 cm MRI of prostate from 10/22 without convincing evidence of clinically significant prostate cancer; volume 84 cm.  He has been followed at the [facility]. no records available for review. There is some documentation of a diagnosis of prostate cancer with Gleason 6 disease on biopsy from 2014.  I do not find additional documentation regarding this.  Prostate MRI from 02/01/2024 showed no focal lesion of intermediate or higher suspicion of prostate cancer; prostate volume 100 cm.  BPH with LUTS: He has a long history of BPH with lower urinary tract symptoms.  Cystoscopy from 9/17 showed lateral lobe enlargement without a median lobe.  He underwent placement of a UroLift by Dr. Janit in 2017.  Since that time, he has been managed with daily tadalafil .  He reports that his lower urinary tract symptoms are stable.  He does have some frequency  and nocturia.  No dysuria or gross hematuria. IPSS = 8/2.  His lower urinary tract symptoms are stable.  He continues with some frequency.  No dysuria or gross hematuria. IPSS = 6/4.  Hypogonadism: He has a history of hypogonadism with low testosterone .  He was previously managed with injections.  These were discontinued due to his diagnosis of prostate cancer.   He is interested in a vasectomy.  He is married with 1 child.  His wife is 69.  No history of scrotal trauma or infection.   Portions of the above documentation were copied from a prior visit for review purposes only.   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   Cannot tolerate statins   Elevated PSA 09/26/2015   Encounter for rehabilitation 04/25/2015   Erectile dysfunction 01/17/2021   GERD (gastroesophageal reflux disease)    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 esophagitis 01/17/2021   History of adenomatous polyp of colon 06/25/2022   Sep 28, 2015 Entered By: WELBY AURELIANO HERO Comment: 2000 polyp; +++; 2012 tics; *Surveillance  2017Jan 11, 2017 Entered By: WELBY AURELIANO HERO Comment: MGM=CRC age 42   History of diverticulitis 02/08/2016   HYPERTENSION, BENIGN ESSENTIAL, UNCONTROLLED 03/12/2009   Qualifier: Diagnosis of  By: Pauline MD, Stephen     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   Insomnia 02/08/2016   Malignant neoplasm of prostate (HCC) 03/25/2021   Metabolic syndrome 06/25/2022   Myopia of both eyes 03/21/2016   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   Feb 19, 2020 Entered By: DJOFJW,[email or web address] Comment: statin induced   Neuromuscular disorder (HCC)    neuropathy but being treated   Nuclear sclerotic cataract of both eyes 03/21/2016   Pain in joint, pelvic region and thigh 06/25/2022   Paroxysmal atrial fibrillation (HCC) 12/16/2017   PONV (postoperative nausea and vomiting)    Presbyopia 03/21/2016   Regular astigmatism of both eyes 03/21/2016   Retinal edema 12/13/2017   SBO (small bowel obstruction) (HCC) 10/08/2017   Septic shock (HCC) 10/04/2017   Sleep apnea    has a sleep study but refuses that he has sleep study, not wearing CPAP   Tributary (branch) retinal vein occlusion, left eye, 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/25/2022    Past Surgical History:  Past Surgical History:  Procedure Laterality Date   BACK SURGERY     X2 - lower   CHOLECYSTECTOMY N/A 12/20/2017   Procedure: LAPAROSCOPIC CHOLECYSTECTOMY WITH INTRAOPERATIVE CHOLANGIOGRAM POSSIBLE OPEN;  Surgeon: Gail Favorite, MD;  Location: [facility] OR;  Service: General;  Laterality: N/A;   COLONOSCOPY     ESOPHAGOGASTRODUODENOSCOPY     IR RADIOLOGIST EVAL & MGMT  10/30/2017   IR RADIOLOGIST EVAL & MGMT  11/14/2017   PROSTATE SURGERY  2017   uro lift per pt.   REPLACEMENT TOTAL KNEE Left    ROTATOR CUFF REPAIR     Bil    Allergies:  Allergies  Allergen Reactions   Codeine     hallucinations   Farxiga  [Dapagliflozin ] Other (See Comments)    Genital infection and irritation   Statins     Has tried several cannot tolerate    Family History:  Family History  Problem Relation Age of Onset   Heart  failure Father    Heart disease Father    Emphysema Mother    Colon cancer Maternal Grandmother     Social History:  Social History   Tobacco Use   Smoking status: Former    Current packs/day: 0.00    Types: Cigarettes    Quit date: 09/18/1975    Years since quitting: 48.6   Smokeless tobacco: Never  Vaping Use   Vaping status: Never Used  Substance Use Topics   Alcohol  use: Not Currently    Alcohol /week: 0.0 standard drinks of alcohol     Comment: Rarely   Drug use: Never    ROS: Constitutional:  Negative for fever, chills, weight loss CV: Negative for chest pain, previous MI, hypertension Respiratory:  Negative for shortness of breath, wheezing, sleep apnea, frequent cough GI:  Negative for nausea, vomiting, bloody stool, GERD  Physical exam: BP (!) 186/113   Pulse 71  Ht 5' 8 (1.727 m)   Wt 237 lb (107.5 kg)   BMI 36.04 kg/m  GENERAL APPEARANCE:  Well appearing, well developed, well nourished, NAD HEENT:  Atraumatic, normocephalic, oropharynx clear NECK:  Supple without lymphadenopathy or thyromegaly ABDOMEN:  Soft, non-tender, no masses EXTREMITIES:  Moves all extremities well, without clubbing, cyanosis, or edema NEUROLOGIC:  Alert and oriented x 3, normal gait, CN II-XII grossly intact MENTAL STATUS:  appropriate BACK:  Non-tender to palpation, No CVAT SKIN:  Warm, dry, and intact   Results: U/A: 6-10 WBCs, 0-2 RBCs   VASECTOMY CONSULTATION  Timothy Chapman presents for vasectomy consultation today.  He is a 78 y.o. male, Married with 1 child.  He and his wife have discussed the issues regarding long-term fertility and are comfortable with this decision.  He presents for consideration for vasectomy.  I discussed the issues in detail with him today and he expressed no reservations.  As to the procedure, no scalpel technique vasectomy is explained and reviewed in detail.  Generalized risks including but not limited to bleeding, infection, orchalgia,  testicular atrophy, epididymitis, scrotal hematoma, and chronic pain are discussed.   Additionally, he understands that the possibility of vas recanalization following vasectomy is possible although rare.  Most importantly, the patient understands that he is not sterile initially and will need a semen analysis check to confirm sterility such that no sperm are seen.  He is advised to avoid ejaculation for 10 days following the procedure.  The initial semen analysis will be checked in approximately 12 weeks and in some patients, several months may be required for clearance of all sperm.  He reports a clear understanding of the need for continued birth control until sterility is confirmed.  Otherwise, general issues regarding local anesthesia, prep, alprazolam  are discussed and he reports a clear understanding.

## 2024-04-30 NOTE — Patient Instructions (Signed)
 Vasectomy is a safe, simple, and effective office based procedure that provides men with permanent sterility.  The no scalpel technique has been a refinement but often results in less swelling and pain than the traditional vasectomy method.  Prior to a vasectomy, it is important to make a decision that you are interested in permanent sterility and that you and your partner must be completely sure that you do not want children in the future.  To prepare for the procedure, stop taking any aspirin or blood thinners for 1 week prior to the procedure.  The day of your procedure take a shower and thoroughly clean your scrotum.  It is not necessary to shave prior to the procedure as any shaving that is needed will be performed in the office.  Bring a pair of tight underwear such as briefs, boxer briefs, or athletic shorts with you for the day of the procedure.  You may eat a light meal prior to the procedure.  During the procedure, the scrotal skin will be sterilized completely.  Anesthesia will be provided by injection of a local anesthetic into the scrotum which will provide anesthesia for 2-3 hours after the procedure.  Once the local anesthetic takes effect, a tiny puncture is made in the middle of the front of the scrotum through which both vasa deferens tubes can be partially removed and the ends of the tubes cauterized.  A small absorbable suture is placed in the skin incision.  Antibiotic ointment and sterile gauze dressings are applied and are held in place with the undergarment.  Prescriptions for an antibiotic and pain medication will be sent to your pharmacy.  The antibiotic should be taken to completion.  The pain medication can be taken as needed every 4-6 hours.  If a narcotic pain medication is too strong, over-the-counter analgesics such as Tylenol or ibuprofen may be taken instead.  After the procedure, it is important to take it easy for a couple of days and apply the ice pack to the scrotal area.   The ice pack goes on top of the undergarment and should be used for 10-15 minutes at a time while you are awake.  After the first 2 days, you can gradually increase your activity.  During the first week, it is important to avoid strenuous activity or activity that puts pressure in the scrotal area.  It is also advisable to restrain from heavy lifting or long distance running during this time period.  Often, supportive underwear is helpful to reduce pain during the first week.  You should also avoid sexual activity for 10 days.  You can resume normal activities after 1 week and sexual relations in 10 days.  One of the most important considerations after vasectomy is that you are still fertile after the procedure.  It generally takes at least 20 ejaculations and 3 months time before you are considered sterile.  Even 3 months after the procedure, some men will have a few persistent sperm present.  To be considered sterile, you will need to produce a semen sample that shows no sperm.  You will receive instructions to bring in your first semen specimen to the office 3 months after the procedure.  It is very important that you continue to use your current method of birth control during this time as it is possible to achieve a pregnancy until you become sterile.  Potential complications of vasectomy include bleeding (less than 1% risk of serious bleeding), infection (less than 1%), reconnection of  the vas deferens (09/998 chance), pregnancy (09/1998 chance), shrinkage of the testicle (09/4998 chance), and chronic pain (2-4%).  Other potential consequences of vasectomy include sperm granuloma (a small round area of scar tissue in the area of the procedure is performed), and congestion of the epididymis (a fullness of the tubes where sperm are stored).  Sperm will continue to be produced by the testicles, but the sperm will eventually die and be absorbed by the body.  The amount of semen that is produced will not change.   The main difference is that the semen will not contain sperm after a man has become sterile.  The procedure does not affect your urination, sex drive, or erections.  In summary, vasectomy provides permanent birth control for men.  It is an office-based procedure which is safe, effective, and economical.  After the procedure, it takes time to become sterile so proper precautions must be taken until sterility is achieved.    Taking care of yourself after a VASECTOMY                                              Patient Information Sheet        The following information will reinforce some of the instructions that your doctor has given you.  Day of Procedure: 1) Wear the scrotal supporter and gauze pad 2) Use an ice pack on the scrotum for 15 minutes every hour for 48 hours to help reduce discomfort, swelling and bruising (do NOT place ice directly on your skin, but place on top of the supporter) 3) Expect some clear to pinkish drainage at the surgical site for the first 24-48 hours 4) If needed, use pain medications provided or ibuprofen 800 mg every 8 hours for discomfort 5) Avoid strenuous activities like mowing, lifting, jogging and exercising for 1 week.  Take it easy! 6) If you develop a fever over 101 F or sudden onset of significant swelling within the first 12 hours, please call to report this to your doctor as soon as possible.     Day Two and Three: 1) You may take a shower, but avoid tubs, pools or hot tubs. 2) Continue to wear the scrotal supporter as needed for comfort and change or remove the gauze pad if desired 3) Keep taking it easy!  Avoid strenuous activities like mowing, lifting, jogging and exercising.   4) Continue to watch for signs or symptoms of fever or significant swelling 5) Apply a small amount of antibiotic ointment to incision 1-2 times/day  The rest of the week: 1) Gradually return to normal physical activities after one week.  A return of soreness might  mean you are        "doing too much too soon". 2) Avoid sexual activity for 10 days after the procedure 3) Continue to take a shower, but avoid tubs, pools or hot tubs 4) Wearing the scrotal supporter is optional based on your comfort.     Remember to use an alternate form of contraception for 3 months until you have been checked and CLEARED by your urologist!  62-Month lab appointment:  1) The lab technician will need to look at a semen sample under a microscope  2) Use the specimen cup provided to collect the sample AT HOME 1 hour before the appointment  3) DO NOT refrigerate the specimen, but keep  at room or body temperature  4) Avoid ejaculation for 2-5 days before collecting the specimen  5) Collect the entire specimen by masturbation using NO lubricant  6) Make sure your name, MR number, date and time of collection are on the cup

## 2024-05-02 LAB — PHI SCORE REFLEX
% Free PSA: 32.3 %
PSA, Free: 2.06 ng/mL
Prostate Heath Index Score: 54.9
p2PSA: 44.8 pg/mL

## 2024-05-02 LAB — PROSTATE HEALTH INDEX: Prostate Specific Ag: 6.4 ng/mL — ABNORMAL HIGH (ref 0.0–3.9)

## 2024-05-07 ENCOUNTER — Ambulatory Visit: Payer: Self-pay | Admitting: Urology

## 2024-05-12 DIAGNOSIS — M1611 Unilateral primary osteoarthritis, right hip: Secondary | ICD-10-CM | POA: Diagnosis not present

## 2024-05-13 ENCOUNTER — Other Ambulatory Visit: Payer: Self-pay | Admitting: Family Medicine

## 2024-05-25 ENCOUNTER — Ambulatory Visit: Admitting: Urology

## 2024-05-25 ENCOUNTER — Encounter: Payer: Self-pay | Admitting: Urology

## 2024-05-25 VITALS — BP 179/115 | HR 102 | Ht 69.0 in | Wt 230.0 lb

## 2024-05-25 DIAGNOSIS — Z302 Encounter for sterilization: Secondary | ICD-10-CM

## 2024-05-25 HISTORY — PX: VASECTOMY: SHX75

## 2024-05-25 MED ORDER — CEPHALEXIN 500 MG PO CAPS: 500.0000 mg | ORAL_CAPSULE | Freq: Three times a day (TID) | ORAL | 0 refills | Status: AC

## 2024-05-25 MED ORDER — HYDROCODONE-ACETAMINOPHEN 5-325 MG PO TABS: 1.0000 | ORAL_TABLET | Freq: Four times a day (QID) | ORAL | 0 refills | Status: DC | PRN

## 2024-05-25 NOTE — Progress Notes (Signed)
 Assessment: 1. Encounter for vasectomy      Plan: Post vasectomy instructions given Rx sent. Post vasectomy semen analysis in 12 weeks Return to office in 5 months  Chief Complaint:  Chief Complaint  Patient presents with   VAS    History of Present Illness:  Timothy Chapman is a 78 y.o. male who is seen for vasectomy today. He is married with 1 child.  His wife is 15.  No history of scrotal trauma or infection.  He is followed for elevated PSA, ASAP on prostate biopsy in 2013, BPH with LUTS, and hypogonadism.  Elevated PSA/ASAP on biopsy: He has a long history of elevated PSAs and has previously undergone prostate biopsy.  A prostate biopsy from 10/13 showed ASAP in the right lateral apex.  Prostate volume measured 78 mL.  PSA results: 1/16 4.1 1/17 5.57 8/17 5.25 12/18 8.86 9/20 6.75 10/21 8.02 10/22 8.95 1/23 8.72 11/23 9.48 2/25 10.48  MRI of the prostate from 7/19 showed a PI-RADS 3 lesion in the left apical zone; prostate volume 90 cm MRI of prostate from 10/22 without convincing evidence of clinically significant prostate cancer; volume 84 cm.  He has been followed at the [facility]. no records available for review. There is some documentation of a diagnosis of prostate cancer with Gleason 6 disease on biopsy from 2014.  I do not find additional documentation regarding this.  Prostate MRI from 02/01/2024 showed no focal lesion of intermediate or higher suspicion of prostate cancer; prostate volume 100 cm.  PHI from 8/25:  PSA 6.4, free PSA 32.2%, PHI score 54.9 indicating 33.3% risk of prostate cancer.  BPH with LUTS: He has a long history of BPH with lower urinary tract symptoms.  Cystoscopy from 9/17 showed lateral lobe enlargement without a median lobe.  He underwent placement of a UroLift by Dr. Janit in 2017.  Since that time, he has been managed with daily tadalafil .  He reports that his lower urinary tract symptoms are stable.  He does have some frequency  and nocturia.  No dysuria or gross hematuria. IPSS = 8/2.  His lower urinary tract symptoms are stable.  He continued with some frequency.  No dysuria or gross hematuria. IPSS = 6/4.  Hypogonadism: He has a history of hypogonadism with low testosterone .  He was previously managed with injections.  These were discontinued due to his diagnosis of prostate cancer.  Portions of the above documentation were copied from a prior visit for review purposes only.   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/25/2022   Chronic back pain 02/08/2016   Controlled type 2 diabetes mellitus with diabetic neuropathy, without long-term current use of insulin  (HCC) 02/08/2016   Depression    Diabetes mellitus with neuropathy (HCC) 06/25/2022   Diabetes mellitus without complication (HCC)    type 2   Drug-induced  myopathy 01/21/2020   Cannot tolerate statins   Elevated PSA 09/26/2015   Encounter for rehabilitation 04/25/2015   Erectile dysfunction 01/17/2021   GERD (gastroesophageal reflux disease)    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 esophagitis 01/17/2021   History of adenomatous polyp of colon 06/25/2022   Sep 28, 2015 Entered By: WELBY AURELIANO HERO Comment: 2000 polyp; +++; 2012 tics; *Surveillance 2017Jan 11, 2017 Entered By: WELBY AURELIANO HERO Comment: MGM=CRC age 47   History of diverticulitis 02/08/2016   HYPERTENSION, BENIGN  ESSENTIAL, UNCONTROLLED 03/12/2009   Qualifier: Diagnosis of  By: Pauline MD, Stephen     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   Insomnia 02/08/2016   Malignant neoplasm of prostate (HCC) 03/25/2021   Metabolic syndrome 06/25/2022   Myopia of both eyes 03/21/2016   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   Feb 19, 2020 Entered By: [email or web address] Comment: statin induced   Neuromuscular disorder (HCC)    neuropathy but being treated   Nuclear sclerotic cataract of both eyes 03/21/2016   Pain in joint, pelvic region and thigh 06/25/2022   Paroxysmal atrial fibrillation (HCC) 12/16/2017   PONV (postoperative nausea and vomiting)    Presbyopia 03/21/2016   Regular astigmatism of both eyes 03/21/2016   Retinal edema 12/13/2017   SBO (small bowel obstruction) (HCC) 10/08/2017   Septic shock (HCC) 10/04/2017   Sleep apnea    has a sleep study but refuses that he has sleep study, not wearing CPAP   Tributary (branch) retinal vein occlusion, left eye, 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   REPLACEMENT TOTAL KNEE Left    ROTATOR CUFF REPAIR     Bil    Allergies:  Allergies  Allergen Reactions   Codeine     hallucinations   Farxiga  [Dapagliflozin ] Other (See Comments)    Genital infection and irritation   Statins     Has tried several cannot tolerate    Family History:  Family History  Problem Relation Age of Onset   Heart failure Father    Heart disease Father    Emphysema Mother    Colon cancer Maternal Grandmother     Social History:  Social  History   Tobacco Use   Smoking status: Former    Current packs/day: 0.00    Types: Cigarettes    Quit date: 09/18/1975    Years since quitting: 48.7   Smokeless tobacco: Never  Vaping Use   Vaping status: Never Used  Substance Use Topics   Alcohol  use: Not Currently    Alcohol /week: 0.0 standard drinks of alcohol     Comment: Rarely   Drug use: Never    ROS: Constitutional:  Negative for fever, chills, weight loss CV: Negative for chest pain, previous MI, hypertension Respiratory:  Negative for shortness of breath, wheezing, sleep apnea, frequent cough GI:  Negative for nausea, vomiting, bloody stool, GERD  Physical exam: BP (!) 179/115   Pulse (!) 102   Ht 5' 9 (1.753 m)   Wt 230 lb (104.3 kg)  BMI 33.97 kg/m  GENERAL APPEARANCE:  Well appearing, well developed, well nourished, NAD  Results: None  VASECTOMY PROCEDURE:  Timothy Chapman presents for vasectomy following previous vasectomy consultation and permit is signed.  The patient's anterior scrotal wall is shaved and prepped with Betadine  in standard sterile fashion.  1% lidocaine  is used as local anesthetic in the scrotal and peri vasal tissue.  A standard median raphe punch incision is made and a no scalpel technique vasectomy is performed.  Bilateral vas are isolated from the peri vasal tissue and an approximately 1 cm segment of vas is excised.  Proximal and distal segments are internally cauterized with electric heat cautery. Interposition of perivasal tissue was performed.  Bilateral palpation confirms bilateral vasectomy defect and no significant bleeding or hematoma is identified.  Neosporin gauze dressing and a scrotal support are applied.    Disposition: Patient is discharged home with Rx for pain medication and antibiotics.  Patient is given routine vasectomy instructions.   Most importantly, he is instructed and cautioned again regarding the need for protected intercourse until such time that a single   negative semen analysis has been obtained.  The initial semen analysis will be checked in approximately 12  weeks.  The patient reports a clear understanding.  He will call with any interval questions or concerns.

## 2024-05-25 NOTE — Patient Instructions (Signed)

## 2024-06-01 ENCOUNTER — Encounter: Payer: Self-pay | Admitting: Family Medicine

## 2024-06-03 ENCOUNTER — Ambulatory Visit: Admitting: Pharmacist

## 2024-06-03 ENCOUNTER — Encounter: Payer: Self-pay | Admitting: Pharmacist

## 2024-06-03 DIAGNOSIS — E113292 Type 2 diabetes mellitus with mild nonproliferative diabetic retinopathy without macular edema, left eye: Secondary | ICD-10-CM

## 2024-06-03 DIAGNOSIS — I10 Essential (primary) hypertension: Secondary | ICD-10-CM

## 2024-06-03 NOTE — Progress Notes (Signed)
 06/03/2024 Name: Timothy Chapman MRN: [id number] DOB: 02/22/1946  Chief Complaint  Patient presents with   Diabetes   Hypertension   Hyperlipidemia    Timothy Chapman is a 78 y.o. year old male. [facility] has been referred for Medication Assistance, diabetes, hypertension and hyperlipidemia.   Diabetes: Noted complications - retinopathy, neuropathy, nephropathy  Current Therapy: Mounjaro  12.5mg  weekly - getting thru [place]; Metformin  1000mg  twice a day   Past Thereapies Tried: Farxiga  - caused genital infection / irritation; glipizide  - caused hypoglycemia, Victoza  - did not get A1c / blood glucose to goals; Ozempic  - did not get A1c / blood glucose to goals  His last A1c was the [place] was 8.0% on 04/09/2024. He states he discussed with his PCP at the [place] about increasing Mounjaro  from 12.5mg  weekly to 15mg  weekly but dose has not been changed yet. Last dispense date for Mounjaro  at [place] was 04/09/2024 for 84 day supply   He has restarted using Libre 3+ Continuous Glucose Monitor sensors on 04/14/2024.  Reviewed Continuous Glucose Monitor report today.  CGM Documentation:  Days Worn: 14 (recommend 14 days) % Time CGM is active: 98% (goal >=70%) Average Glucose: 181 mg/dL Glucose Management Indicator: 7.6% Glucose Variability: 17.5% (goal <36%) Time in Range:  - Time above range >250: 2% (typical goal: <5%) - Time above range 181-250: 46% (typical goal <20%) - Time in range 70-180 52% (typical goal >=70%) - Time below range 54-69: 0% (typical goal <4%) - Time below range: 0% (typical goal <1%)  Glucose trends: Overall it appears blood glucose has improved over the last 2 months with GMI of 7.6% compared to A1c from [place] of 8.0%. Patient is still having some post prandial highs.       Exercise: walking a little. Recently had vasectomy and has not started exercising every day yet.   Macrovascular and Microvascular Risk Reduction:  Statin? no; patient previously intolerant to statin  therapy - taking Repatha  ACEi/ARB? yes (valsartan );  tried Farxiga  in past for diabetes but stopped due to genital infections / irritation. He has declined to take Jardiance in past due to concerns of having similar side effect as Farxiga .  Last urinary albumin/creatinine ratio:  Lab Results  Component Value Date   MICRALBCREAT 175.9 04/09/2024   Last eye exam:  Lab Results  Component Value Date   HMDIABEYEEXA No Retinopathy 07/23/2023   Last foot exam: 04/27/2024 Tobacco Use:  Tobacco Use: Medium Risk (06/03/2024)   Patient History    Smoking Tobacco Use: Former    Smokeless Tobacco Use: Never    Passive Exposure: Not on file     Last vitamin B12 and Folate Lab Results  Component Value Date   VITAMINB12 1,438 (H) 11/21/2022   FOLATE >24.4 04/26/2021    Last office weight was 230 lbs- has lost about 22 lbs since 11/2022 when he started Mounjaro . Starting weight was 252 lbs Starting BMI = 37.2  Wt Readings from Last 3 Encounters:  05/25/24 230 lb (104.3 kg)  04/30/24 237 lb (107.5 kg)  04/27/24 238 lb (108 kg)    CKD - G3a / A2: Currently taking valsartan  320mg  daily for hypertension; has  Hyperlipidemia:  Continues to use Repatha  - using [employer] for cost - grant good thru 08/27/2024   Patient noted to be intolerant to statins - caused muscle aches and cramps. Dr Watt did use statin exclusion code at last office visit.  Past statins tried - rosuvastatin and atorvastatin and both cause muscle  pain / cramps  Lipid panel checked at [facility] 04/09/2024 showed slightly elevated triglycerides but have improved - has been as high as 815 in past. LDL was at goal.     Objective:  Lab Results  Component Value Date   HGBA1C 8.0 04/09/2024    Lab Results  Component Value Date   CREATININE 1.30 10/24/2023   BUN 25 (H) 10/24/2023   NA 139 10/24/2023   K 4.3 10/24/2023   CL 106 10/24/2023   CO2 23 10/24/2023    Lab Results  Component Value Date   CHOL 101  04/09/2024   HDL 31 (A) 04/09/2024   LDLCALC 54 04/09/2024   LDLDIRECT 106.0 08/16/2021   TRIG 205 (A) 04/09/2024   CHOLHDL 3 10/24/2023    Medications Reviewed Today     Reviewed by Carla Milling, RPH-CPP (Pharmacist) on 06/03/24 at 1340  Med List Status: <None>   Medication Order Taking? Sig Documenting Provider Last Dose Status Informant  Acetylcarnitine HCl 500 MG CAPS [id number]  Take 500 mg by mouth daily. [provider]  Active Self           Med Note FELIPE, MELISSA B   Thu Nov 21, 2017 12:03 PM)    Alcohol  Swabs  (B-D SINGLE USE SWABS  REGULAR) PADS [id number]  USE PRIOR TO INSULIN  DOSE Copland, Harlene BROCKS, MD  Active   Alpha-Lipoic Acid 600 MG CAPS [id number]  Take 600 mg by mouth daily. [provider]  Active Self           Med Note JUSTINO, MILLING B   Tue Apr 04, 2021  1:16 PM)     Patient not taking:   Discontinued 06/03/24 1332 (Completed Course)   aspirin  81 MG chewable tablet [id number]  Chew 1 tablet (81 mg total) by mouth daily. Jillian Buttery, MD  Active Self  b complex vitamins capsule [id number]  Take 1 capsule by mouth daily. [provider]  Active   Continuous Glucose Sensor (FREESTYLE LIBRE 3 PLUS SENSOR) MISC [id number] Yes USE TO CHECK BLOOD GLUCOSE CONTINUOUSLY. CHANGE       SENSOR EVERY 15DAYS Copland, Harlene BROCKS, MD  Active   diclofenac  (VOLTAREN ) 75 MG EC tablet [id number] Yes TAKE 1 TABLET TWICE A DAY Copland, Jessica C, MD  Active            Med Note JUSTINO, Rahcel Shutes B   Wed Jun 03, 2024  1:31 PM)    Evolocumab  (REPATHA  SURECLICK) 140 MG/ML SOAJ [id number] Yes Inject 140 mg into the skin every 14 (fourteen) days. Copland, Harlene BROCKS, MD  Active   famotidine  (PEPCID ) 20 MG tablet [id number]  Take 1 tablet (20 mg total) by mouth 2 (two) times daily. Copland, Harlene BROCKS, MD  Active   glucose blood (ONETOUCH VERIO) test strip [id number]  USE TO CHECK BLOOD GLUCOSE TWO TIMES A DAY  Patient not taking: Reported on 06/03/2024   Copland, Harlene BROCKS, MD   Active   HYDROcodone -acetaminophen  (NORCO/VICODIN) 5-325 MG tablet [id number]  Take 1 tablet by mouth every 6 (six) hours as needed. Stoneking, Adine PARAS., MD  Active   Lancets [facility] DELICA PLUS [place]) MISC [id number]  USE TWO TIMES A DAY TO     CHECK BLOOD SUGAR Copland, Harlene BROCKS, MD  Active   melatonin 5 MG TABS [id number]  Take 5 mg by mouth at bedtime.  [provider]  Active Self  metFORMIN  (GLUCOPHAGE -XR) 500 MG 24 hr tablet [id number] Yes TAKE 2 TABLETS 2 TIMES  DAILY WITH MEALS Copland, Jessica C, MD  Active   Multiple Vitamin (MULTIVITAMIN) tablet [id number] Yes Take 1 tablet by mouth daily. [provider]  Active Self  niacin  (NIASPAN ) 1000 MG CR tablet [id number] Yes Take 500 mg by mouth 2 (two) times daily. [provider]  Active            Med Note JUSTINO, MADELIN KATHEE Schaumann Nov 23, 2021  2:06 PM) Patient is taking over-the-counter niacin   omeprazole  (PRILOSEC) 40 MG capsule [id number] Yes Take 1 capsule (40 mg total) by mouth daily. Copland, Harlene BROCKS, MD  Active   OVER THE COUNTER MEDICATION [id number]  Take 600 mg by mouth in the morning and at bedtime. NAD Plus (nicotinamide adenine dinucleotide) [provider]  Active   tadalafil  (CIALIS ) 10 MG tablet [id number] Yes Take 1-2 tablets (10-20 mg total) by mouth every other day as needed for erectile dysfunction. Copland, Harlene BROCKS, MD  Active   tadalafil  (CIALIS ) 5 MG tablet [id number] Yes Take 1 tablet (5 mg total) by mouth daily. Copland, Harlene BROCKS, MD  Active   tirzepatide  (MOUNJARO ) 12.5 MG/0.5ML Pen [id number] Yes Inject 12.5 mg into the skin once a week. Copland, Harlene BROCKS, MD  Active            Med Note JUSTINO, [place] B   Mon Apr 29, 2023  1:40 PM) Getting from [place]  valsartan  (DIOVAN ) 320 MG tablet [id number] Yes Take 1 tablet (320 mg total) by mouth daily. Copland, Harlene BROCKS, MD  Active   VITAMIN D -VITAMIN K PO [id number] Yes Take 1 tablet by mouth daily. [provider]  Active   zinc  gluconate 50 MG tablet [id number]  Take 50 mg by mouth daily. [provider]  Active                Assessment/Plan:  Type 2 DM - he has had some readings that were > 200 but mostly has been in recommended range. Last A1c at [facility] was not at goal -Since he gets Mounjaro  thru [place] clinic, again recommended he reach out to his PCP at the [facility] to request increase in Mounjaro  dose to 15mg  weekly. -He could get Mounjaro  thru his [facility] plan. Mounjaro  is tier 3 / preferred brand but his copay for preferred brand is 25% of medication cost which would be about $250 / month.  It is more cost effective for him to continue to get Mounjaro  thru the [place].  - Continue metformin  ER 500mg  - take 2 tablets twice a day - Continue to use Libre 3+ sensor to check blood glucose. Reviewed report with patient - Encouraged increase physical activity - goal is at least 150 minutes per week.   CKD - G3a / A2 - did not tolerate Farxiga  in past. He is on max dose of ARB.  - consider trial of Jardiance if next UACR is above goal. I suspect previous experience with Farxiga  could have been related to uncontrolled blood glucose which is blood glucose per Continuous Glucose Monitor report is much better than in the past.  - [name] is also a possibility to decrease CKD progression or referral to nephrology but is not on [place] formulary but could get exception if all the following criteria is met:  (1)Type 2 diabetes mellitus, (2) Chronic kidney disease diagnosis with estimated glomerular filtration rate >= 25 ml/min/1.60m2AND persistent albuminuria with urinary albumin-to-creatinine ratio >= 30 mg/g (3)Receiving treatment containing maximumtolerated labeled dose of an angiotensin-converting enzyme  inhibitor (ACEI)  or angiotensin receptor blocker (ARB) or unable to use an ACEI or ARB (4) Receiving treatment with a sodium-glucose cotransporter-2 (SGLT2) inhibitor or unable to use an SGLT2 inhibitor. Cost of [name] on  his [place] plan would be 25% medication cost as coapy.   Hyperlipidemia - LDL at goal; Tg were slightly elevated but improved; HDLC still below goal of > 40.  History of statin intolerance due to muscle pain Continue Repatha  every 14 days - enrolled in [facility] - good thru 08/27/2024   Follow Up Plan: follow up in 1 to 2 months.   Madelin Ray, PharmD Clinical Pharmacist [facility]

## 2024-06-10 ENCOUNTER — Encounter: Payer: Self-pay | Admitting: Family Medicine

## 2024-06-23 ENCOUNTER — Other Ambulatory Visit: Payer: Self-pay

## 2024-06-23 DIAGNOSIS — K219 Gastro-esophageal reflux disease without esophagitis: Secondary | ICD-10-CM | POA: Insufficient documentation

## 2024-06-23 DIAGNOSIS — E119 Type 2 diabetes mellitus without complications: Secondary | ICD-10-CM | POA: Insufficient documentation

## 2024-06-24 ENCOUNTER — Ambulatory Visit: Attending: Cardiology | Admitting: Cardiology

## 2024-06-24 ENCOUNTER — Encounter: Payer: Self-pay | Admitting: Cardiology

## 2024-06-24 VITALS — BP 150/86 | HR 67 | Ht 69.0 in | Wt 235.4 lb

## 2024-06-24 DIAGNOSIS — E1140 Type 2 diabetes mellitus with diabetic neuropathy, unspecified: Secondary | ICD-10-CM

## 2024-06-24 DIAGNOSIS — E0840 Diabetes mellitus due to underlying condition with diabetic neuropathy, unspecified: Secondary | ICD-10-CM

## 2024-06-24 DIAGNOSIS — E669 Obesity, unspecified: Secondary | ICD-10-CM | POA: Diagnosis not present

## 2024-06-24 DIAGNOSIS — E782 Mixed hyperlipidemia: Secondary | ICD-10-CM | POA: Diagnosis not present

## 2024-06-24 DIAGNOSIS — I10 Essential (primary) hypertension: Secondary | ICD-10-CM | POA: Diagnosis not present

## 2024-06-24 DIAGNOSIS — G4730 Sleep apnea, unspecified: Secondary | ICD-10-CM

## 2024-06-24 DIAGNOSIS — Z01810 Encounter for preprocedural cardiovascular examination: Secondary | ICD-10-CM

## 2024-06-24 NOTE — Progress Notes (Signed)
 Cardiology Office Note:    Date:  06/24/2024   ID:  Timothy Chapman, DOB 05/29/1946, MRN [id number]  PCP:  Timothy Harlene BROCKS, MD  Cardiologist:  Timothy JONELLE Crape, MD   Referring MD: Timothy Harlene BROCKS, MD    ASSESSMENT:    1. Preop cardiovascular exam   2. Essential hypertension   3. Controlled type 2 diabetes mellitus with diabetic neuropathy, without long-term current use of insulin  (HCC)   4. Obesity (BMI 35.0-39.9 without comorbidity)   5. Sleep apnea, unspecified type   6. Diabetes mellitus due to underlying condition with diabetic neuropathy, without long-term current use of insulin  (HCC)   7. Mixed dyslipidemia    PLAN:    In order of problems listed above:  Coronary artery disease: Preoperative assessment:Secondary prevention stressed with the patient.  Importance of compliance with diet medication stressed and patient verbalized standing.  In view of significant coronary artery disease will do a Lexiscan sestamibi for preop assessment.  If this is negative he is not at high risk for coronary events during the aforementioned surgery.  Meticulous hemodynamic monitoring will further reduce the risk of coronary events. Essential hypertension: Blood pressure is stable and diet was emphasized.  In view of diabetic neuropathy and frequent falls I am going to not pursue aggressive blood pressure lowering. Mixed dyslipidemia: On lipid-lowering medications followed by primary care.  Lipids reviewed diet emphasized. Diabetes mellitus and obesity: Weight reduction stressed and he promises to do better.  Risks of obesity explained. Cardiac murmur: Echocardiogram will be done to assess murmur heard on auscultation. Patient will be seen in follow-up appointment in 6 months or earlier if the patient has any concerns.    Medication Adjustments/Labs and Tests Ordered: Current medicines are reviewed at length with the patient today.  Concerns regarding medicines are outlined above.   Orders Placed This Encounter  Procedures   EKG 12-Lead   No orders of the defined types were placed in this encounter.    No chief complaint on file.    History of Present Illness:    Timothy Chapman is a 78 y.o. male.  Patient has past medical history of coronary artery disease, essential hypertension, mixed dyslipidemia, diabetes mellitus with neuropathy.  There is mention of paroxysmal atrial fibrillation but there are no records of this.  He is having frequent falls and it appears to be related to his neuropathy and probably orthopedic issues.  He is planning to undergo hip replacement surgery.  He denies any chest pain orthopnea PND but leads a sedentary lifestyle.  At the time of my evaluation, the patient is alert awake oriented and in no distress.  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   Cannot tolerate statins   Dyspnea on exertion 06/26/2022    Elevated PSA 09/26/2015   Encounter for rehabilitation 04/25/2015   Erectile dysfunction 01/17/2021   Essential hypertension 06/26/2022   GERD (gastroesophageal reflux disease)    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 esophagitis 01/17/2021   History of adenomatous polyp of colon 06/25/2022   Sep 28, 2015 Entered By: WELBY AURELIANO HERO Comment: 2000 polyp; +++; 2012 tics; *Surveillance 2017Jan 11, 2017 Entered By: WELBY AURELIANO HERO Comment: MGM=CRC age 12   History of diverticulitis 02/08/2016   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   Insomnia 02/08/2016   Malignant neoplasm of prostate (HCC) 03/25/2021   Metabolic syndrome 06/25/2022   Mixed dyslipidemia 06/26/2022   Myopia of both eyes 03/21/2016   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   Feb 19, 2020 Entered By: DJOFJW,QJPSJ Comment: statin induced   Neuromuscular disorder (HCC)    neuropathy but being treated   Nuclear sclerotic cataract of both eyes 03/21/2016   Obesity (BMI 35.0-39.9 without comorbidity) 06/26/2022   Pain in joint, pelvic region and thigh 06/25/2022   Paroxysmal atrial fibrillation (HCC) 12/16/2017   PONV (postoperative nausea and vomiting)    Presbyopia 03/21/2016   Regular astigmatism of both eyes 03/21/2016   Retinal edema 12/13/2017   SBO (small bowel obstruction) (HCC) 10/08/2017   Septic shock (HCC) 10/04/2017   Sleep apnea    has a sleep study but refuses that he has sleep study, not wearing CPAP   Tributary (branch) retinal vein occlusion, left eye, with macular edema (HCC) 06/25/2022   Type 2 diabetes mellitus with diabetic polyneuropathy, without long-term current use of insulin  (HCC) 10/06/2015   Type 2 diabetes mellitus with left eye affected by mild nonproliferative retinopathy without macular edema, without long-term current use of insulin  (HCC) 03/21/2016    Past Surgical History:  Procedure Laterality Date   BACK SURGERY     X2 - lower   CHOLECYSTECTOMY N/A 12/20/2017   Procedure: LAPAROSCOPIC CHOLECYSTECTOMY WITH INTRAOPERATIVE  CHOLANGIOGRAM POSSIBLE OPEN;  Surgeon: Gail Favorite, MD;  Location: [facility] OR;  Service: General;  Laterality: N/A;   COLONOSCOPY     ESOPHAGOGASTRODUODENOSCOPY     IR RADIOLOGIST EVAL & MGMT  10/30/2017   IR RADIOLOGIST EVAL & MGMT  11/14/2017   PROSTATE SURGERY  2017   uro lift per pt.   REPLACEMENT TOTAL KNEE Left    ROTATOR CUFF REPAIR     Bil   VASECTOMY Bilateral 05/25/2024    Current Medications: Current Meds  Medication Sig   Acetylcarnitine HCl 500 MG CAPS Take 500 mg by mouth daily.   Alcohol  Swabs  (B-D SINGLE USE SWABS  REGULAR) PADS USE PRIOR TO INSULIN  DOSE   Alpha-Lipoic Acid 600 MG CAPS Take 600 mg by mouth daily.   b complex vitamins capsule Take 1 capsule by mouth daily.   Continuous Glucose Sensor (FREESTYLE LIBRE 3 PLUS SENSOR) MISC USE TO CHECK BLOOD GLUCOSE CONTINUOUSLY. CHANGE       SENSOR EVERY 15DAYS   diclofenac  (VOLTAREN ) 75 MG EC tablet TAKE 1 TABLET TWICE A DAY   Evolocumab  (REPATHA  SURECLICK) 140 MG/ML SOAJ Inject 140 mg into the skin every 14 (fourteen) days.   famotidine  (PEPCID ) 20 MG tablet Take 1 tablet (20 mg total) by mouth 2 (two) times daily.  glucose blood (ONETOUCH VERIO) test strip USE TO CHECK BLOOD GLUCOSE TWO TIMES A DAY   HYDROcodone -acetaminophen  (NORCO/VICODIN) 5-325 MG tablet Take 1 tablet by mouth every 6 (six) hours as needed.   Lancets (ONETOUCH DELICA PLUS LANCET30G) MISC USE TWO TIMES A DAY TO     CHECK BLOOD SUGAR   melatonin 5 MG TABS Take 5 mg by mouth at bedtime.    metFORMIN  (GLUCOPHAGE -XR) 500 MG 24 hr tablet TAKE 2 TABLETS 2 TIMES     DAILY WITH MEALS   Multiple Vitamin (MULTIVITAMIN) tablet Take 1 tablet by mouth daily.   niacin  (NIASPAN ) 1000 MG CR tablet Take 500 mg by mouth 2 (two) times daily.   omeprazole  (PRILOSEC) 40 MG capsule Take 1 capsule (40 mg total) by mouth daily.   tadalafil  (CIALIS ) 10 MG tablet Take 1-2 tablets (10-20 mg total) by mouth every other day as needed for erectile dysfunction.   tadalafil   (CIALIS ) 5 MG tablet Take 1 tablet (5 mg total) by mouth daily.   tirzepatide  (MOUNJARO ) 12.5 MG/0.5ML Pen Inject 12.5 mg into the skin once a week.   valsartan  (DIOVAN ) 320 MG tablet Take 1 tablet (320 mg total) by mouth daily.   VITAMIN D -VITAMIN K PO Take 1 tablet by mouth daily.   zinc gluconate 50 MG tablet Take 50 mg by mouth daily.     Allergies:   Codeine, Farxiga  [dapagliflozin ], and Statins   Social History   Socioeconomic History   Marital status: Married    Spouse name: Not on file   Number of children: 2   Years of education: Not on file   Highest education level: Associate degree: academic program  Occupational History   Not on file  Tobacco Use   Smoking status: Former    Current packs/day: 0.00    Types: Cigarettes    Quit date: 09/18/1975    Years since quitting: 48.8   Smokeless tobacco: Never  Vaping Use   Vaping status: Never Used  Substance and Sexual Activity   Alcohol  use: Not Currently    Alcohol /week: 0.0 standard drinks of alcohol     Comment: Rarely   Drug use: Never   Sexual activity: Not on file  Other Topics Concern   Not on file  Social History Narrative   Right handed   Caffeine 1-2 cups daily   Lives at home with wife and his son.   Social Drivers of Health   Financial Resource Strain: Medium Risk (04/20/2024)   Overall Financial Resource Strain (CARDIA)    Difficulty of Paying Living Expenses: Somewhat hard  Food Insecurity: Food Insecurity Present (04/20/2024)   Hunger Vital Sign    Worried About Running Out of Food in the Last Year: Not on file    Ran Out of Food in the Last Year: Sometimes true  Transportation Needs: Unmet Transportation Needs (04/20/2024)   PRAPARE - Administrator, Civil Service (Medical): Yes    Lack of Transportation (Non-Medical): Yes  Physical Activity: Insufficiently Active (04/20/2024)   Exercise Vital Sign    Days of Exercise per Week: 2 days    Minutes of Exercise per Session: 20 min  Stress: No  Stress Concern Present (04/20/2024)   [employer] of Occupational Health - Occupational Stress Questionnaire    Feeling of Stress: Only a little  Social Connections: Moderately Integrated (04/20/2024)   Social Connection and Isolation Panel    Frequency of Communication with Friends and Family: Three times a week  Frequency of Social Gatherings with Friends and Family: Once a week    Attends Religious Services: 1 to 4 times per year    Active Member of [employer] or Organizations: No    Attends Banker Meetings: Not on file    Marital Status: Married  Recent Concern: Social Connections - Moderately Isolated (03/05/2024)   Social Connection and Isolation Panel    Frequency of Communication with Friends and Family: Three times a week    Frequency of Social Gatherings with Friends and Family: Never    Attends Religious Services: Never    Database administrator or Organizations: No    Attends Engineer, structural: Never    Marital Status: Married     Family History: The patient's family history includes Colon cancer in his maternal grandmother; Emphysema in his mother; Heart disease in his father; Heart failure in his father.  ROS:   Please see the history of present illness.    All other systems reviewed and are negative.  EKGs/Labs/Other Studies Reviewed:    The following studies were reviewed today: .SABRAEKG Interpretation Date/Time:  Wednesday June 24 2024 09:51:48 EDT Ventricular Rate:  67 PR Interval:  228 QRS Duration:  72 QT Interval:  390 QTC Calculation: 412 R Axis:   -22  Text Interpretation: Sinus rhythm with 1st degree A-V block Left ventricular hypertrophy Inferior infarct (cited on or before 07-Oct-2017) Anterior infarct , age undetermined Abnormal ECG When compared with ECG of 07-Oct-2017 05:27, PR interval has increased Anterior infarct is now Present Confirmed by Edwyna Backers [id number]) on 06/24/2024 10:19:04 AM     Recent Labs: 10/24/2023:  ALT 36; BUN 25; Creatinine, Ser 1.30; Hemoglobin 14.4; Platelets 255.0; Potassium 4.3; Sodium 139  Recent Lipid Panel    Component Value Date/Time   CHOL 101 04/09/2024 0000   CHOL 162 07/10/2022 1036   TRIG 205 (A) 04/09/2024 0000   HDL 31 (A) 04/09/2024 0000   HDL 33 (L) 07/10/2022 1036   CHOLHDL 3 10/24/2023 0906   VLDL 64.0 (H) 10/24/2023 0906   LDLCALC 54 04/09/2024 0000   LDLCALC 63 07/10/2022 1036   LDLCALC  07/06/2020 1448     Comment:     . LDL cholesterol not calculated. Triglyceride levels greater than 400 mg/dL invalidate calculated LDL results. . Reference range: <100 . Desirable range <100 mg/dL for primary prevention;   <70 mg/dL for patients with CHD or diabetic patients  with > or = 2 CHD risk factors. SABRA LDL-C is now calculated using the Martin-Hopkins  calculation, which is a validated novel method providing  better accuracy than the Friedewald equation in the  estimation of LDL-C.  Gladis APPLETHWAITE et al. SANDREA. 7986;689(80): 2061-2068  (http://education.[facility].com/faq/FAQ164)    LDLDIRECT 106.0 08/16/2021 1058    Physical Exam:    VS:  BP (!) 140/88   Pulse 67   Ht 5' 9 (1.753 m)   Wt 235 lb 6.4 oz (106.8 kg)   SpO2 97%   BMI 34.76 kg/m     Wt Readings from Last 3 Encounters:  06/24/24 235 lb 6.4 oz (106.8 kg)  05/25/24 230 lb (104.3 kg)  04/30/24 237 lb (107.5 kg)     GEN: Patient is in no acute distress HEENT: Normal NECK: No JVD; No carotid bruits LYMPHATICS: No lymphadenopathy CARDIAC: Hear sounds regular, 2/6 systolic murmur at the apex. RESPIRATORY:  Clear to auscultation without rales, wheezing or rhonchi  ABDOMEN: Soft, non-tender, non-distended MUSCULOSKELETAL:  No edema; No deformity  SKIN: Warm and dry NEUROLOGIC:  Alert and oriented x 3 PSYCHIATRIC:  Normal affect   Signed, Timothy JONELLE Crape, MD  06/24/2024 10:26 AM    [facility]

## 2024-06-24 NOTE — Patient Instructions (Signed)
 Medication Instructions:  Your physician recommends that you continue on your current medications as directed. Please refer to the Current Medication list given to you today.  *If you need a refill on your cardiac medications before your next appointment, please call your pharmacy*   Lab Work: None ordered If you have labs (blood work) drawn today and your tests are completely normal, you will receive your results only by: MyChart Message (if you have MyChart) OR A paper copy in the mail If you have any lab test that is abnormal or we need to change your treatment, we will call you to review the results.   Testing/Procedures: You are scheduled for a Myocardial Perfusion Imaging Study.  Please arrive 15 minutes prior to your appointment time for registration and insurance purposes.  The test will take approximately 3 to 4 hours to complete; you may bring reading material.  If someone comes with you to your appointment, they will need to remain in the main lobby due to limited space in the testing area.   How to prepare for your Myocardial Perfusion Test: Do not eat or drink 3 hours prior to your test, except you may have water. Do not consume products containing caffeine (regular or decaffeinated) 12 hours prior to your test. (ex: coffee, chocolate, sodas, tea). Do bring a list of your current medications with you.  If not listed below, you may take your medications as normal. Do wear comfortable clothes (no dresses or overalls) and walking shoes, tennis shoes preferred (No heels or open toe shoes are allowed). Do NOT wear cologne, perfume, aftershave, or lotions (deodorant is allowed). If these instructions are not followed, your test will have to be rescheduled.  If you cannot keep your appointment, please provide 24 hours notification to the Nuclear Lab, to avoid a possible $50 charge to your account.  Non-Cardiac CT scanning, (CAT scanning), is a noninvasive, special x-ray that  produces cross-sectional images of the body using x-rays and a computer. CT scans help physicians diagnose and treat medical conditions. For some CT exams, a contrast material is used to enhance visibility in the area of the body being studied. CT scans provide greater clarity and reveal more details than regular x-ray exams.  [facility] at [facility] [address] [place], [place] [address] [phone]  Follow-Up: At [facility], you and your health needs are our priority.  As part of our continuing mission to provide you with exceptional heart care, we have created designated Provider Care Teams.  These Care Teams include your primary Cardiologist (physician) and Advanced Practice Providers (APPs -  Physician Assistants and Nurse Practitioners) who all work together to provide you with the care you need, when you need it.  We recommend signing up for the patient portal called MyChart.  Sign up information is provided on this After Visit Summary.  MyChart is used to connect with patients for Virtual Visits (Telemedicine).  Patients are able to view lab/test results, encounter notes, upcoming appointments, etc.  Non-urgent messages can be sent to your provider as well.   To learn more about what you can do with MyChart, go to [email or web address].    Your next appointment:   12 month(s)  Provider:   Jennifer Crape, MD   Other Instructions  Cardiac Nuclear Scan A cardiac nuclear scan is a test that is done to check the flow of blood to your heart. It is done when you are resting and when you are exercising.  The test looks for problems such as: Not enough blood reaching a portion of the heart. The heart muscle not working as it should. You may need this test if you have: Heart disease. Lab results that are not normal. Had heart surgery or a balloon procedure to open up blocked arteries (angioplasty) or a small mesh tube (stent). Chest  pain. Shortness of breath. Had a heart attack. In this test, a special dye (tracer) is put into your bloodstream. The tracer will travel to your heart. A camera will then take pictures of your heart to see how the tracer moves through your heart. This test is usually done at a hospital and takes 2-4 hours. Tell a doctor about: Any allergies you have. All medicines you are taking, including vitamins, herbs, eye drops, creams, and over-the-counter medicines. Any bleeding problems you have. Any surgeries you have had. Any medical conditions you have. Whether you are pregnant or may be pregnant. Any history of asthma or long-term (chronic) lung disease. Any history of heart rhythm disorders or heart valve conditions. What are the risks? Your doctor will talk with you about risks. These may include: Serious chest pain and heart attack. This is only a risk if the stress portion of the test is done. Fast or uneven heartbeats (palpitations). A feeling of warmth in your chest. This feeling usually does not last long. Allergic reaction to the tracer. Shortness of breath or trouble breathing. What happens before the test? Ask your doctor about changing or stopping your normal medicines. Follow instructions from your doctor about what you cannot eat or drink. Remove your jewelry on the day of the test. Ask your doctor if you need to avoid nicotine or caffeine. What happens during the test? An IV tube will be inserted into one of your veins. Your doctor will give you a small amount of tracer through the IV tube. You will wait for 20-40 minutes while the tracer moves through your bloodstream. Your heart will be monitored with an electrocardiogram (ECG). You will lie down on an exam table. Pictures of your heart will be taken for about 15-20 minutes. You may also have a stress test. For this test, one of these things may be done: You will be asked to exercise on a treadmill or a stationary  bike. You will be given medicines that will make your heart work harder. This is done if you are unable to exercise. When blood flow to your heart has peaked, a tracer will again be given through the IV tube. After 20-40 minutes, you will get back on the exam table. More pictures will be taken of your heart. Depending on the tracer that is used, more pictures may need to be taken 3-4 hours later. Your IV tube will be removed when the test is over. The test may vary among doctors and hospitals. What happens after the test? Ask your doctor: Whether you can return to your normal schedule, including diet, activities, travel, and medicines. Whether you should drink more fluids. This will help to remove the tracer from your body. Ask your doctor, or the department that is doing the test: When will my results be ready? How will I get my results? What are my treatment options? What other tests do I need? What are my next steps? This information is not intended to replace advice given to you by your health care provider. Make sure you discuss any questions you have with your health care provider. Document Revised: 01/30/2022 Document  Reviewed: 01/30/2022 Elsevier Patient Education  2023 [employer].

## 2024-06-30 ENCOUNTER — Telehealth: Payer: Self-pay | Admitting: *Deleted

## 2024-06-30 ENCOUNTER — Ambulatory Visit (HOSPITAL_COMMUNITY): Admission: RE | Admit: 2024-06-30 | Source: Ambulatory Visit | Attending: Cardiology | Admitting: Cardiology

## 2024-06-30 NOTE — Telephone Encounter (Signed)
 Pt given instructions for stress test.

## 2024-07-01 ENCOUNTER — Ambulatory Visit: Attending: Cardiology

## 2024-07-01 DIAGNOSIS — Z01810 Encounter for preprocedural cardiovascular examination: Secondary | ICD-10-CM

## 2024-07-01 MED ORDER — TECHNETIUM TC 99M TETROFOSMIN IV KIT
32.3000 | PACK | Freq: Once | INTRAVENOUS | Status: AC | PRN
  Administered 2024-07-01: 32.3 via INTRAVENOUS

## 2024-07-01 MED ORDER — REGADENOSON 0.4 MG/5ML IV SOLN
0.4000 mg | Freq: Once | INTRAVENOUS | Status: AC
  Administered 2024-07-01: 0.4 mg via INTRAVENOUS

## 2024-07-01 MED ORDER — TECHNETIUM TC 99M TETROFOSMIN IV KIT
10.8000 | PACK | Freq: Once | INTRAVENOUS | Status: AC | PRN
  Administered 2024-07-01: 10.8 via INTRAVENOUS

## 2024-07-02 LAB — MYOCARDIAL PERFUSION IMAGING
LV dias vol: 87 mL (ref 62–150)
LV sys vol: 40 mL (ref 4.2–5.8)
Nuc Stress EF: 54 %
Peak HR: 97 {beats}/min
Rest HR: 75 {beats}/min
Rest Nuclear Isotope Dose: 10.8 mCi
SDS: 2
SRS: 2
SSS: 4
ST Depression (mm): 0 mm
Stress Nuclear Isotope Dose: 32.3 mCi
TID: 1.06

## 2024-07-03 ENCOUNTER — Other Ambulatory Visit: Payer: Self-pay | Admitting: Family Medicine

## 2024-07-03 DIAGNOSIS — M2550 Pain in unspecified joint: Secondary | ICD-10-CM

## 2024-07-03 DIAGNOSIS — K219 Gastro-esophageal reflux disease without esophagitis: Secondary | ICD-10-CM

## 2024-07-03 DIAGNOSIS — E1140 Type 2 diabetes mellitus with diabetic neuropathy, unspecified: Secondary | ICD-10-CM

## 2024-07-06 ENCOUNTER — Ambulatory Visit: Payer: Self-pay | Admitting: Cardiology

## 2024-07-06 DIAGNOSIS — R931 Abnormal findings on diagnostic imaging of heart and coronary circulation: Secondary | ICD-10-CM

## 2024-07-06 DIAGNOSIS — I480 Paroxysmal atrial fibrillation: Secondary | ICD-10-CM

## 2024-07-10 ENCOUNTER — Ambulatory Visit (INDEPENDENT_AMBULATORY_CARE_PROVIDER_SITE_OTHER)
Admission: RE | Admit: 2024-07-10 | Discharge: 2024-07-10 | Disposition: A | Discharge status: Home / Self Care | Source: Ambulatory Visit | Attending: Cardiology | Admitting: Cardiology

## 2024-07-10 DIAGNOSIS — I700 Atherosclerosis of aorta: Secondary | ICD-10-CM | POA: Diagnosis not present

## 2024-07-10 DIAGNOSIS — Z01810 Encounter for preprocedural cardiovascular examination: Secondary | ICD-10-CM | POA: Diagnosis not present

## 2024-07-13 NOTE — Telephone Encounter (Signed)
Viewed in MyChart Routed to PCP  

## 2024-07-26 ENCOUNTER — Other Ambulatory Visit: Payer: Self-pay | Admitting: Family Medicine

## 2024-07-26 DIAGNOSIS — E1140 Type 2 diabetes mellitus with diabetic neuropathy, unspecified: Secondary | ICD-10-CM

## 2024-07-30 ENCOUNTER — Encounter: Payer: Self-pay | Admitting: Urology

## 2024-08-03 ENCOUNTER — Other Ambulatory Visit

## 2024-08-04 ENCOUNTER — Ambulatory Visit: Payer: Self-pay | Admitting: Cardiology

## 2024-08-04 ENCOUNTER — Ambulatory Visit (HOSPITAL_BASED_OUTPATIENT_CLINIC_OR_DEPARTMENT_OTHER)
Admission: RE | Admit: 2024-08-04 | Discharge: 2024-08-04 | Disposition: A | Discharge status: Home / Self Care | Source: Ambulatory Visit | Attending: Cardiology | Admitting: Cardiology

## 2024-08-04 DIAGNOSIS — I480 Paroxysmal atrial fibrillation: Secondary | ICD-10-CM | POA: Diagnosis not present

## 2024-08-04 DIAGNOSIS — R931 Abnormal findings on diagnostic imaging of heart and coronary circulation: Secondary | ICD-10-CM | POA: Insufficient documentation

## 2024-08-04 LAB — ECHOCARDIOGRAM COMPLETE
AR max vel: 2.12 cm2
AV Area VTI: 2.11 cm2
AV Area mean vel: 2.1 cm2
AV Mean grad: 4 mmHg
AV Peak grad: 6.5 mmHg
Ao pk vel: 1.27 m/s
Area-P 1/2: 13.08 cm2
Calc EF: 64.7 %
MV M vel: 4.52 m/s
MV Peak grad: 81.7 mmHg
S' Lateral: 2.4 cm
Single Plane A2C EF: 60.8 %
Single Plane A4C EF: 68 %

## 2024-08-06 ENCOUNTER — Other Ambulatory Visit: Payer: Self-pay

## 2024-08-11 ENCOUNTER — Encounter: Payer: Self-pay | Admitting: Pharmacist

## 2024-08-11 NOTE — Progress Notes (Signed)
 Pharmacy Quality Measure Review  This patient is appearing on the insurance-providing list for being at risk of failing the adherence measure for Statin Use in Persons with Diabetes (SUPD) medications this calendar year.   Timothy Chapman has type 2 DM and hyperlipidemia. He is currently using Repatha  140mg  SQ every 14 days to treat hyperlipidemia and lower ASCVD risk.   He has tried rosuvastatin and atorvastatin in the past. He stopped both due to muscle cramps and myalgias.     Lab Results  Component Value Date   CHOL 101 04/09/2024   HDL 31 (A) 04/09/2024   LDLCALC 54 04/09/2024   LDLDIRECT 106.0 08/16/2021   TRIG 205 (A) 04/09/2024   CHOLHDL 3 10/24/2023    Assessment: hyperlipemia treated with PCSK9 due to history of statin myalgias, currently LDL is at goal.   Plan:  Plan was to coordinate with cardiology to use statin exclusion if appropriate but it appears patient has rescheduled appointment from 11/26 to 12/3.   Continue Repatha  140mg  every 14 days. - will renew [employer] in December 2025.  Madelin Ray, PharmD Clinical Pharmacist [facility]  Population Health [phone]

## 2024-08-12 ENCOUNTER — Ambulatory Visit: Admitting: Cardiology

## 2024-08-12 NOTE — Progress Notes (Signed)
 Timothy Chapman                                          MRN: [id number]   08/12/2024   The [facility] Quality Team Specialist reviewed this patient medical record for the purposes of chart review for care gap closure. The following were reviewed: chart review for care gap closure-controlling blood pressure.    [facility]I Quality Team

## 2024-08-18 ENCOUNTER — Other Ambulatory Visit: Payer: Self-pay

## 2024-08-19 ENCOUNTER — Other Ambulatory Visit: Payer: Self-pay | Admitting: Pharmacist

## 2024-08-19 ENCOUNTER — Encounter: Payer: Self-pay | Admitting: Cardiology

## 2024-08-19 ENCOUNTER — Ambulatory Visit: Attending: Cardiology | Admitting: Cardiology

## 2024-08-19 VITALS — BP 162/100 | HR 84 | Ht 68.6 in | Wt 234.4 lb

## 2024-08-19 DIAGNOSIS — E119 Type 2 diabetes mellitus without complications: Secondary | ICD-10-CM | POA: Diagnosis not present

## 2024-08-19 DIAGNOSIS — I2510 Atherosclerotic heart disease of native coronary artery without angina pectoris: Secondary | ICD-10-CM | POA: Diagnosis not present

## 2024-08-19 DIAGNOSIS — E782 Mixed hyperlipidemia: Secondary | ICD-10-CM | POA: Diagnosis not present

## 2024-08-19 DIAGNOSIS — G4730 Sleep apnea, unspecified: Secondary | ICD-10-CM

## 2024-08-19 DIAGNOSIS — I10 Essential (primary) hypertension: Secondary | ICD-10-CM | POA: Diagnosis not present

## 2024-08-19 MED ORDER — SPIRONOLACTONE 25 MG PO TABS: 12.5000 mg | ORAL_TABLET | Freq: Every day | ORAL | 3 refills | Status: DC

## 2024-08-19 NOTE — Patient Instructions (Signed)
 Medication Instructions:  Your physician has recommended you make the following change in your medication:  Spironolactone 12.5 mg daily Please refer to the Current Medication list given to you today.  *If you need a refill on your cardiac medications before your next appointment, please call your pharmacy*   Lab Work: BMP If you have labs (blood work) drawn today and your tests are completely normal, you will receive your results only by: MyChart Message (if you have MyChart) OR A paper copy in the mail If you have any lab test that is abnormal or we need to change your treatment, we will call you to review the results.   Testing/Procedures: Please keep a BP log for 1 week and return for a nurse visit.              Dr. Edwyna [address] [place], [place] [address]  Blood Pressure Record Sheet To take your blood pressure, you will need a blood pressure machine. You can buy a blood pressure machine (blood pressure monitor) at your clinic, drug store, or online. When choosing one, consider: An automatic monitor that has an arm cuff. A cuff that wraps snugly around your upper arm. You should be able to fit only one finger between your arm and the cuff. A device that stores blood pressure reading results. Do not choose a monitor that measures your blood pressure from your wrist or finger. Follow your health care provider's instructions for how to take your blood pressure. To use this form: Get one reading in the morning (a.m.) 1-2 hours after you take any medicines. Get one reading in the evening (p.m.) before supper.   Blood pressure log Date: _______________________  a.m. _____________________(1st reading) HR___________            p.m. _____________________(2nd reading) HR__________  Date: _______________________  a.m. _____________________(1st reading) HR___________            p.m. _____________________(2nd reading) HR__________  Date: _______________________  a.m.  _____________________(1st reading) HR___________            p.m. _____________________(2nd reading) HR__________  Date: _______________________  a.m. _____________________(1st reading) HR___________            p.m. _____________________(2nd reading) HR__________  Date: _______________________  a.m. _____________________(1st reading) HR___________            p.m. _____________________(2nd reading) HR__________  Date: _______________________  a.m. _____________________(1st reading) HR___________            p.m. _____________________(2nd reading) HR__________  Date: _______________________  a.m. _____________________(1st reading) HR___________            p.m. _____________________(2nd reading) HR__________   This information is not intended to replace advice given to you by your health care provider. Make sure you discuss any questions you have with your health care provider. Document Revised: 12/23/2019 Document Reviewed: 12/23/2019 Elsevier Patient Education  2021 Else[employer] Inc[employer]     Follow-Up: At [facility], you and your health needs are our priority.  As part of our continuing mission to provide you with exceptional heart care, we have created designated Provider Care Teams.  These Care Teams include your primary Cardiologist (physician) and Advanced Practice Providers (APPs -  Physician Assistants and Nurse Practitioners) who all work together to provide you with the care you need, when you need it.  We recommend signing up for the patient portal called MyChart.  Sign up information is provided on this After Visit Summary.  MyChart is used to connect with patients for  Virtual Visits (Telemedicine).  Patients are able to view lab/test results, encounter notes, upcoming appointments, etc.  Non-urgent messages can be sent to your provider as well.   To learn more about what you can do with MyChart, go to [email or web address].    Your next appointment:   9  month(s)  The format for your next appointment:   In Person  Provider:   Jennifer Crape, MD    Other Instructions none  Important Information About Sugar

## 2024-08-19 NOTE — Progress Notes (Signed)
 Cardiology Office Note:    Date:  08/19/2024   ID:  Timothy Chapman, DOB 09-Sep-1946, MRN [id number]  PCP:  Watt Harlene BROCKS, MD  Cardiologist:  Jennifer JONELLE Crape, MD   Referring MD: Watt Harlene BROCKS, MD    ASSESSMENT:    1. Essential hypertension   2. Sleep apnea, unspecified type   3. Diabetes mellitus without complication (HCC)   4. Mixed dyslipidemia   5. Coronary arteriosclerosis    PLAN:    In order of problems listed above:  Coronary atherosclerosis: Secondary prevention stressed with the patient.  Importance of compliance with diet medication stressed and patient verbalized standing. Preoperative cardiovascular evaluation: Stress test and echo were unremarkable.  We are in the process of optimizing blood pressure.  If his blood pressure is optimized and he is not at high risk for coronary events during the aforementioned surgery.  Meticulous hemodynamic monitoring will further reduce risk of coronary events. Essential hypertension: Elevated blood pressure: His blood pressure remains in the range of 150/80 or so.  I will initiate spironolactone 12.5 mg daily he will be back in 1 week for pulse blood pressure check and a Chem-7.  He will bring blood pressure log from home.  Salt and dietary issues were discussed. Diabetes mellitus: Not at the best of control.  A1c is 8 and I cautioned him against this and he promises to do better. Obesity: Weight reduction stressed diet emphasized and he promises to do better.  Risks of obesity explained. Patient will be seen in follow-up appointment in 6 months or earlier if the patient has any concerns.    Medication Adjustments/Labs and Tests Ordered: Current medicines are reviewed at length with the patient today.  Concerns regarding medicines are outlined above.  No orders of the defined types were placed in this encounter.  No orders of the defined types were placed in this encounter.    No chief complaint on file.    History  of Present Illness:    Timothy Chapman is a 78 y.o. male.  Patient has past medical history of coronary atherosclerosis, essential hypertension, mixed dyslipidemia and diabetes mellitus.  He is here for preop assessment.  Stress test and echo were unremarkable.  He denies any chest pain orthopnea or PND.  He takes care of activities of daily living.  At the time of my evaluation, the patient is alert awake oriented and in no distress.  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   Cannot tolerate statins   Dyspnea on exertion 06/26/2022   Elevated PSA 09/26/2015   Encounter for rehabilitation 04/25/2015   Erectile dysfunction 01/17/2021   Essential hypertension 06/26/2022   GERD (gastroesophageal reflux disease)    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 esophagitis 01/17/2021   History of adenomatous polyp of colon 06/25/2022   Sep 28, 2015 Entered By: WELBY AURELIANO HERO Comment: 2000 polyp;  +++; 2012 tics; *Surveillance 2017Jan 11, 2017 Entered By: WELBY AURELIANO HERO Comment: MGM=CRC age 43   History of diverticulitis 02/08/2016   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   Insomnia 02/08/2016   Malignant neoplasm of prostate (HCC) 03/25/2021   Metabolic syndrome 06/25/2022   Mixed dyslipidemia 06/26/2022   Myopia of both eyes 03/21/2016   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   Feb 19, 2020 Entered By: [email or web address] Comment: statin induced   Neuromuscular disorder (HCC)    neuropathy but being treated   Nuclear sclerotic cataract of both eyes 03/21/2016   Obesity (BMI 35.0-39.9 without comorbidity) 06/26/2022   Pain in joint, pelvic region and thigh 06/25/2022   Paroxysmal atrial fibrillation (HCC) 12/16/2017   PONV (postoperative nausea and vomiting)    Presbyopia 03/21/2016   Regular astigmatism of both eyes 03/21/2016   Retinal edema 12/13/2017   SBO (small bowel obstruction) (HCC) 10/08/2017   Septic shock (HCC) 10/04/2017   Sleep apnea    has a sleep study but refuses that he has sleep study, not wearing CPAP   Tributary (branch) retinal vein occlusion, left eye, with macular edema (HCC) 06/25/2022   Type 2 diabetes mellitus with diabetic polyneuropathy, without long-term current use of insulin  (HCC) 10/06/2015   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   REPLACEMENT TOTAL KNEE Left    ROTATOR CUFF REPAIR      Bil   VASECTOMY Bilateral 05/25/2024    Current Medications: Current Meds  Medication Sig   Acetylcarnitine HCl 500 MG CAPS Take 500 mg by mouth daily.   Alcohol  Swabs  (B-D SINGLE USE SWABS  REGULAR) PADS USE PRIOR TO INSULIN  DOSE   Alpha-Lipoic Acid 600 MG CAPS Take 600 mg by mouth daily.   b complex vitamins capsule Take 1 capsule by mouth daily.   Continuous Glucose Sensor (FREESTYLE LIBRE 3 PLUS SENSOR) MISC USE TO CHECK BLOOD GLUCOSE CONTINUOUSLY. CHANGE       SENSOR EVERY 15DAYS   diclofenac  (VOLTAREN ) 75 MG EC tablet Take 1 tablet (75 mg total) by mouth 2 (two) times daily.   Evolocumab  (REPATHA  SURECLICK) 140 MG/ML SOAJ Inject 140 mg into the skin every 14 (fourteen) days.   famotidine  (PEPCID ) 20 MG tablet Take 1 tablet (20 mg total) by mouth 2 (two) times daily.   glucose blood (ONETOUCH VERIO) test strip USE TO CHECK BLOOD GLUCOSE TWO TIMES A DAY   Lancets (ONETOUCH DELICA PLUS LANCET30G) MISC USE TWO TIMES A DAY TO  CHECK BLOOD SUGAR   melatonin 5 MG TABS Take 5 mg by mouth at bedtime.    metFORMIN  (GLUCOPHAGE -XR) 500 MG 24 hr tablet Take 2 tablets (1,000 mg total) by mouth 2 (two) times daily with a meal.   Multiple Vitamin (MULTIVITAMIN) tablet Take 1 tablet by mouth daily.   niacin  (NIASPAN ) 1000 MG CR tablet Take 500 mg by mouth 2 (two) times daily.   omeprazole  (PRILOSEC) 40 MG capsule Take 1 capsule (40 mg total) by mouth daily.   OVER THE COUNTER MEDICATION Take 600 mg by mouth daily. NAD Plus (nicotinamide adenine dinucleotide)   tadalafil  (CIALIS ) 10 MG tablet Take 1-2 tablets (10-20 mg total) by mouth every other day as needed for erectile dysfunction.   tadalafil  (CIALIS ) 5 MG tablet Take 1 tablet (5 mg total) by mouth daily.   tirzepatide  (MOUNJARO ) 12.5 MG/0.5ML Pen Inject 12.5 mg into the skin once a week.   valsartan  (DIOVAN ) 320 MG tablet Take 1 tablet (320 mg total) by mouth daily.   VITAMIN D -VITAMIN K PO Take 1 tablet by mouth daily.   zinc gluconate 50  MG tablet Take 50 mg by mouth daily.     Allergies:   Codeine, Farxiga  [dapagliflozin ], and Statins   Social History   Socioeconomic History   Marital status: Married    Spouse name: Not on file   Number of children: 2   Years of education: Not on file   Highest education level: Associate degree: academic program  Occupational History   Not on file  Tobacco Use   Smoking status: Former    Current packs/day: 0.00    Types: Cigarettes    Quit date: 09/18/1975    Years since quitting: 48.9   Smokeless tobacco: Never  Vaping Use   Vaping status: Never Used  Substance and Sexual Activity   Alcohol  use: Not Currently    Alcohol /week: 0.0 standard drinks of alcohol     Comment: Rarely   Drug use: Never   Sexual activity: Not on file  Other Topics Concern   Not on file  Social History Narrative   Right handed   Caffeine 1-2 cups daily   Lives at home with wife and his son.   Social Drivers of Health   Financial Resource Strain: Medium Risk (04/20/2024)   Overall Financial Resource Strain (CARDIA)    Difficulty of Paying Living Expenses: Somewhat hard  Food Insecurity: Food Insecurity Present (04/20/2024)   Hunger Vital Sign    Worried About Running Out of Food in the Last Year: Not on file    Ran Out of Food in the Last Year: Sometimes true  Transportation Needs: Unmet Transportation Needs (04/20/2024)   PRAPARE - Administrator, Civil Service (Medical): Yes    Lack of Transportation (Non-Medical): Yes  Physical Activity: Insufficiently Active (04/20/2024)   Exercise Vital Sign    Days of Exercise per Week: 2 days    Minutes of Exercise per Session: 20 min  Stress: No Stress Concern Present (04/20/2024)   [employer] of Occupational Health - Occupational Stress Questionnaire    Feeling of Stress: Only a little  Social Connections: Moderately Integrated (04/20/2024)   Social Connection and Isolation Panel    Frequency of Communication with Friends and Family: Three  times a week    Frequency of Social Gatherings with Friends and Family: Once a week    Attends Religious Services: 1 to 4 times per year    Active Member of Clubs or  Organizations: No    Attends Banker Meetings: Not on file    Marital Status: Married  Recent Concern: Social Connections - Moderately Isolated (03/05/2024)   Social Connection and Isolation Panel    Frequency of Communication with Friends and Family: Three times a week    Frequency of Social Gatherings with Friends and Family: Never    Attends Religious Services: Never    Database Administrator or Organizations: No    Attends Engineer, Structural: Never    Marital Status: Married     Family History: The patient's family history includes Colon cancer in his maternal grandmother; Emphysema in his mother; Heart disease in his father; Heart failure in his father.  ROS:   Please see the history of present illness.    All other systems reviewed and are negative.  EKGs/Labs/Other Studies Reviewed:    The following studies were reviewed today: .SABRA   I discussed test results with the patient at length   Recent Labs: 10/24/2023: ALT 36; BUN 25; Creatinine, Ser 1.30; Hemoglobin 14.4; Platelets 255.0; Potassium 4.3; Sodium 139  Recent Lipid Panel    Component Value Date/Time   CHOL 101 04/09/2024 0000   CHOL 162 07/10/2022 1036   TRIG 205 (A) 04/09/2024 0000   HDL 31 (A) 04/09/2024 0000   HDL 33 (L) 07/10/2022 1036   CHOLHDL 3 10/24/2023 0906   VLDL 64.0 (H) 10/24/2023 0906   LDLCALC 54 04/09/2024 0000   LDLCALC 63 07/10/2022 1036   LDLCALC  07/06/2020 1448     Comment:     . LDL cholesterol not calculated. Triglyceride levels greater than 400 mg/dL invalidate calculated LDL results. . Reference range: <100 . Desirable range <100 mg/dL for primary prevention;   <70 mg/dL for patients with CHD or diabetic patients  with > or = 2 CHD risk factors. SABRA LDL-C is now calculated using the  Martin-Hopkins  calculation, which is a validated novel method providing  better accuracy than the Friedewald equation in the  estimation of LDL-C.  Gladis APPLETHWAITE et al. SANDREA. 7986;689(80): 2061-2068  (http://education.[facility].com/faq/FAQ164)    LDLDIRECT 106.0 08/16/2021 1058    Physical Exam:    VS:  BP (!) 170/104   Pulse 84   Ht 5' 8.6 (1.742 m)   Wt 234 lb 6.4 oz (106.3 kg)   SpO2 96%   BMI 35.02 kg/m     Wt Readings from Last 3 Encounters:  08/19/24 234 lb 6.4 oz (106.3 kg)  07/01/24 235 lb (106.6 kg)  06/24/24 235 lb 6.4 oz (106.8 kg)     GEN: Patient is in no acute distress HEENT: Normal NECK: No JVD; No carotid bruits LYMPHATICS: No lymphadenopathy CARDIAC: Hear sounds regular, 2/6 systolic murmur at the apex. RESPIRATORY:  Clear to auscultation without rales, wheezing or rhonchi  ABDOMEN: Soft, non-tender, non-distended MUSCULOSKELETAL:  No edema; No deformity  SKIN: Warm and dry NEUROLOGIC:  Alert and oriented x 3 PSYCHIATRIC:  Normal affect   Signed, Jennifer JONELLE Crape, MD  08/19/2024 3:18 PM    [facility]

## 2024-08-24 ENCOUNTER — Other Ambulatory Visit

## 2024-08-24 ENCOUNTER — Other Ambulatory Visit (INDEPENDENT_AMBULATORY_CARE_PROVIDER_SITE_OTHER): Admitting: Pharmacist

## 2024-08-24 ENCOUNTER — Encounter: Payer: Self-pay | Admitting: Family Medicine

## 2024-08-24 ENCOUNTER — Other Ambulatory Visit: Payer: Self-pay

## 2024-08-24 DIAGNOSIS — E119 Type 2 diabetes mellitus without complications: Secondary | ICD-10-CM | POA: Diagnosis not present

## 2024-08-24 DIAGNOSIS — Z7985 Long-term (current) use of injectable non-insulin antidiabetic drugs: Secondary | ICD-10-CM | POA: Diagnosis not present

## 2024-08-24 DIAGNOSIS — I10 Essential (primary) hypertension: Secondary | ICD-10-CM

## 2024-08-24 DIAGNOSIS — E782 Mixed hyperlipidemia: Secondary | ICD-10-CM

## 2024-08-24 DIAGNOSIS — E1140 Type 2 diabetes mellitus with diabetic neuropathy, unspecified: Secondary | ICD-10-CM

## 2024-08-24 DIAGNOSIS — G720 Drug-induced myopathy: Secondary | ICD-10-CM

## 2024-08-24 DIAGNOSIS — Z302 Encounter for sterilization: Secondary | ICD-10-CM

## 2024-08-24 DIAGNOSIS — E7800 Pure hypercholesterolemia, unspecified: Secondary | ICD-10-CM | POA: Insufficient documentation

## 2024-08-24 LAB — HEMOGLOBIN A1C: Hgb A1c MFr Bld: 7.1 % — ABNORMAL HIGH (ref 4.6–6.5)

## 2024-08-24 MED ORDER — HYDRALAZINE HCL 10 MG PO TABS: 10.0000 mg | ORAL_TABLET | Freq: Three times a day (TID) | ORAL | 1 refills | Status: DC

## 2024-08-24 NOTE — Progress Notes (Addendum)
 08/24/2024 Name: Timothy Chapman MRN: [id number] DOB: 24-Jan-1946  Chief Complaint  Patient presents with   Diabetes   Hyperlipidemia   Hypertension    Timothy Chapman is a 78 y.o. year old male. [facility] has been referred for Medication Assistance, diabetes, hypertension and hyperlipidemia.   Diabetes: Noted complications - retinopathy, neuropathy, nephropathy  Current Therapy: Mounjaro  12.5mg  weekly - getting thru [place]; Metformin  1000mg  twice a day   Past Thereapies Tried: Farxiga  - caused genital infection / irritation; glipizide  - caused hypoglycemia, Victoza  - did not get A1c / blood glucose to goals; Ozempic  - did not get A1c / blood glucose to goals  His last A1c was the [place] was 8.0% on 04/09/2024. He states he discussed with his PCP at the [place] about increasing Mounjaro  from 12.5mg  weekly to 15mg  weekly but dose has not been changed yet.    He is using Libre 3+ Continuous Glucose Monitor sensors  Reviewed Continuous Glucose Monitor report today.  CGM Documentation:  Days Worn: 14 (recommend 14 days) % Time CGM is active: 96% (goal >=70%) Average Glucose: 152 mg/dL Glucose Management Indicator: 6.9% Glucose Variability: 15.6% (goal <36%) Time in Range:  - Time above range >250: 0% (typical goal: <5%) - Time above range 181-250: 11% (typical goal <20%) - Time in range 70-180 89% (typical goal >=70%) - Time below range 54-69: 0% (typical goal <4%) - Time below range: 0% (typical goal <1%)  Glucose trends: Overall it appears blood glucose has improved over the last 2 weeks with GMI of 6.9% compared to A1c from [place] of 8.0%.        Exercise: has restarted exercising some but is  awaiting hip replacement surgery and also possibly back surgery.    Macrovascular and Microvascular Risk Reduction:  Statin? no; patient previously intolerant to statin therapy; Taking Repatha  ACEi/ARB? yes (valsartan  320mg  daily ) Last urinary albumin/creatinine ratio:  Lab Results  Component  Value Date   MICRALBCREAT 175.9 04/09/2024   Last eye exam:  Lab Results  Component Value Date   HMDIABEYEEXA No Retinopathy 07/23/2023   Last foot exam: 04/27/2024 Tobacco Use:  Tobacco Use: Medium Risk (08/19/2024)   Patient History    Smoking Tobacco Use: Former    Smokeless Tobacco Use: Never    Passive Exposure: Not on file    Last vitamin B12 and Folate Lab Results  Component Value Date   VITAMINB12 1,438 (H) 11/21/2022   FOLATE >24.4 04/26/2021    Last office weight was 234 lbs- has lost about 18 lbs since 11/2022 when he started Mounjaro . Starting weight was 252 lbs Starting BMI = 37.2  Wt Readings from Last 3 Encounters:  08/19/24 234 lb 6.4 oz (106.3 kg)  07/01/24 235 lb (106.6 kg)  06/24/24 235 lb 6.4 oz (106.8 kg)     Hypertension:  Current medications: valsartan  320mg  daily  Dr Kerin recently prescribed spironolactone  but patient has declined to start due to potential anti-andogenic effects and lowering of testosterone  and side effects.    Medications previously tried:  Amlodipine  - caused falls / dizziness Felodipine  - lethargy Hydrochlorothiazide  - patient declined to take - he worries about side effects like dehydration and states he does not need a fluid pill because he has not had any fluid retention.  Metoprolol  - felt like a zombie Labetalol  - stopped 11/2017 - patient did not remember why Hydralazine  - took for a short time but not sure why stopped (edema? But was also taking amlodipine  at the time  as well)    Patient has a validated, automated, upper arm home BP cuff Current blood pressure readings readings: 150/70  Patient denies hypotensive s/sx including no dizziness, lightheadedness.  Patient denies hypertensive symptoms including no headache, chest pain, shortness of breath  Hyperlipidemia:  Continues to use Repatha  - using [employer] for cost - grant good thru 08/27/2024   Patient noted to be intolerant to statins - caused  muscle aches and cramps. Dr Watt did use statin exclusion code at previous office visit in 2025.  Past statins tried - rosuvastatin and atorvastatin and both cause muscle pain / cramps  Lipid panel checked at [facility] 04/09/2024 showed slightly elevated triglycerides but have improved - has been as high as 815 in past. LDL was at goal.     Objective:  Lab Results  Component Value Date   HGBA1C 8.0 04/09/2024    Lab Results  Component Value Date   CREATININE 1.30 10/24/2023   BUN 25 (H) 10/24/2023   NA 139 10/24/2023   K 4.3 10/24/2023   CL 106 10/24/2023   CO2 23 10/24/2023    Lab Results  Component Value Date   CHOL 101 04/09/2024   HDL 31 (A) 04/09/2024   LDLCALC 54 04/09/2024   LDLDIRECT 106.0 08/16/2021   TRIG 205 (A) 04/09/2024   CHOLHDL 3 10/24/2023    Medications Reviewed Today     Reviewed by Carla Milling, RPH-CPP (Pharmacist) on 08/24/24 at 1051  Med List Status: <None>   Medication Order Taking? Sig Documenting Provider Last Dose Status Informant  Acetylcarnitine HCl 500 MG CAPS [id number]  Take 500 mg by mouth daily. [provider]  Active Self           Med Note Chapman, Timothy B   Thu Nov 21, 2017 12:03 PM)    Alcohol  Swabs  (B-D SINGLE USE SWABS  REGULAR) PADS [id number]  USE PRIOR TO INSULIN  DOSE Timothy Chapman, Timothy BROCKS, MD  Active   Alpha-Lipoic Acid 600 MG CAPS [id number]  Take 600 mg by mouth daily. [provider]  Active Self           Med Note Chapman, Timothy Payson B   Tue Apr 04, 2021  1:16 PM)    b complex vitamins capsule [id number]  Take 1 capsule by mouth daily. [provider]  Active   Continuous Glucose Sensor (FREESTYLE LIBRE 3 PLUS SENSOR) MISC [id number]  USE TO CHECK BLOOD GLUCOSE CONTINUOUSLY. CHANGE       SENSOR EVERY 15DAYS Timothy Chapman, Timothy BROCKS, MD  Active   diclofenac  (VOLTAREN ) 75 MG EC tablet [id number]  Take 1 tablet (75 mg total) by mouth 2 (two) times daily. Timothy Chapman, Timothy BROCKS, MD  Active   Evolocumab  (REPATHA  SURECLICK) 140  MG/ML SOAJ [id number]  Inject 140 mg into the skin every 14 (fourteen) days. Timothy Chapman, Timothy BROCKS, MD  Active   famotidine  (PEPCID ) 20 MG tablet [id number]  Take 1 tablet (20 mg total) by mouth 2 (two) times daily. Timothy Chapman, Timothy BROCKS, MD  Active   glucose blood (ONETOUCH VERIO) test strip [id number]  USE TO CHECK BLOOD GLUCOSE TWO TIMES A DAY Timothy Chapman, Timothy BROCKS, MD  Active   Lancets [facility] DELICA PLUS [employer]) MISC [id number]  USE TWO TIMES A DAY TO     CHECK BLOOD SUGAR Timothy Chapman, Timothy BROCKS, MD  Active   melatonin 5 MG TABS [id number]  Take 5 mg by mouth at bedtime.  [provider]  Active Self  metFORMIN  (GLUCOPHAGE -XR) 500 MG 24 hr tablet  [id number]  Take 2 tablets (1,000 mg total) by mouth 2 (two) times daily with a meal. Timothy Chapman, Timothy BROCKS, MD  Active   Multiple Vitamin (MULTIVITAMIN) tablet [id number]  Take 1 tablet by mouth daily. [provider]  Active Self  niacin  (NIASPAN ) 1000 MG CR tablet [id number]  Take 500 mg by mouth 2 (two) times daily. [provider]  Active            Med Note (Chapman, Timothy L   Tue Jun 23, 2024  1:41 PM)    omeprazole  (PRILOSEC) 40 MG capsule [id number]  Take 1 capsule (40 mg total) by mouth daily. Timothy Chapman, Timothy BROCKS, MD  Active   OVER THE COUNTER MEDICATION [id number]  Take 600 mg by mouth daily. NAD Plus (nicotinamide adenine dinucleotide) [provider]  Active   spironolactone  (ALDACTONE ) 25 MG tablet [id number]  Take 0.5 tablets (12.5 mg total) by mouth daily. Revankar, Jennifer SAUNDERS, MD  Active   tadalafil  (CIALIS ) 10 MG tablet [id number]  Take 1-2 tablets (10-20 mg total) by mouth every other day as needed for erectile dysfunction. Timothy Chapman, Timothy BROCKS, MD  Active   tadalafil  (CIALIS ) 5 MG tablet [id number]  Take 1 tablet (5 mg total) by mouth daily. Timothy Chapman, Timothy BROCKS, MD  Active   tirzepatide  (MOUNJARO ) 12.5 MG/0.5ML Pen [id number]  Inject 12.5 mg into the skin once a week. Timothy Chapman, Timothy BROCKS, MD  Active            Med Note (Chapman,  Timothy L   Tue Jun 23, 2024  1:41 PM)    valsartan  (DIOVAN ) 320 MG tablet [id number]  Take 1 tablet (320 mg total) by mouth daily. Timothy Chapman, Timothy BROCKS, MD  Active   VITAMIN D -VITAMIN K PO [id number]  Take 1 tablet by mouth daily. [provider]  Active   zinc gluconate 50 MG tablet [id number]  Take 50 mg by mouth daily. [provider]  Active                Assessment/Plan:  Type 2 DM -  Last A1c at [facility] was not at goal but Continuous Glucose Monitor reports shows improved blood glucose.  - Continue Mounjaro  12.5mg  weekly. He can reach out to PCP at the [facility] to request increase to 15mg  week if he would like to increase.  - Mounjaro  thru his [facility] plan. Mounjaro  is tier 3 / preferred brand but his copay for preferred brand is 25% of medication cost which would be about $250 / month.  It is more cost effective for him to continue to get Mounjaro  thru the [place].  - Continue low carbohydrate diet. - Continue metformin  ER 500mg  - take 2 tablets twice a day - Continue to use Libre 3+ sensor to check blood glucose. Reviewed report with patient - Patient will come into office today to have A1c recheck. Ordered and lab appointment made.   CKD - G3a / A2 - did not tolerate Farxiga  in past. He is on max dose of ARB.  - consider trial of Jardiance if next UACR is above goal. I suspect previous experience with Farxiga  could have been related to uncontrolled blood glucose which his blood glucose per Continuous Glucose Monitor report is much better than in the past.  - Timothy Chapman is also a possibility to decrease CKD progression or referral to nephrology. Kerendia is not on [place] formulary but could get exception if all the following criteria is met:  (1)Type 2 diabetes mellitus, (2) Chronic kidney  disease diagnosis with estimated glomerular filtration rate >= 25 ml/min/1.74m2AND persistent albuminuria with urinary albumin-to-creatinine ratio >= 30 mg/g (3)Receiving treatment containing  maximumtolerated labeled dose of an angiotensin-converting enzyme inhibitor (ACEI)  or angiotensin receptor blocker (ARB) or unable to use an ACEI or ARB (4) Receiving treatment with a sodium-glucose cotransporter-2 (SGLT2) inhibitor or unable to use an SGLT2 inhibitor. Cost of Timothy Chapman on his [place] plan would be 25% medication cost as coapy.   Hyperlipidemia - LDL at goal; Tg were slightly elevated but improved; HDLC still below goal of > 40.  History of statin intolerance due to muscle pain - Continue Repatha  every 14 days - Re-applied for [employer] today. Pending approval. Sent diagnosis verification on-line.    Hypertension: Currently uncontrolled with multiple medications tried and experienced side effects or patient concerned about potential side effects.  - Reviewed long term cardiovascular and renal outcomes of uncontrolled blood pressure - Continue to to check home blood pressure and heart rate daily  - Will discuss options with PCP - patient seems most option to possible retrial of hydralazine  - would start with 10mg  3 times a day and increase as tolerated and needed.  - Discussed other alternatives like clonidine or retrial of low dose beta blocker - Nevivolol or carvedilol would be options to try (lowest risk of effects on ED) .  - Patient mentioned splitting valsartan  into 180mg  twice a day - we can do this but I doubt that it would lower blood pressure much. He also asked about adding ACE-I which I explained would likely not add much additional blood pressure lowering and is not currently recommended since ACE-inhibitors and ARBs have similar mechanisms of action.     Follow Up Plan: follow up in 1 to 2 weeks.   Madelin Ray, PharmD Clinical Pharmacist [facility]  08/24/2024 Addendum:  Dr Watt OK's starting hydralazine  10mg  3 times per day. Discussed with patient to continue to monitor blood pressure and to watch for dizziness,  lightheadedness.  Sent in Rx for #90 and 1 refill.  Madelin Ray, PharmD

## 2024-08-24 NOTE — Addendum Note (Signed)
 Addended by: CARLA MILLING B on: 08/24/2024 01:52 PM   Modules accepted: Orders

## 2024-08-25 ENCOUNTER — Encounter: Payer: Self-pay | Admitting: Family Medicine

## 2024-08-25 ENCOUNTER — Ambulatory Visit: Payer: Self-pay | Admitting: Pharmacist

## 2024-08-25 ENCOUNTER — Ambulatory Visit: Payer: Self-pay | Admitting: Urology

## 2024-08-25 LAB — POST-VAS SPERM EVALUATION,QUAL: Volume: 1.2 mL

## 2024-08-25 NOTE — Telephone Encounter (Signed)
 Left message on VM that A1c results had returned and show improvement. Continue to follow carbohydrate limiting diet. Patient is planning to reach out to [place] PCP to see if he can get Mounjaro  dose increased to 15mg  weekly. Continue metformin  500mg  ER 1000mg  twice a day.

## 2024-08-26 ENCOUNTER — Ambulatory Visit

## 2024-09-03 ENCOUNTER — Encounter: Payer: Self-pay | Admitting: Family Medicine

## 2024-09-04 MED ORDER — TIRZEPATIDE 12.5 MG/0.5ML ~~LOC~~ SOAJ: 12.5000 mg | SUBCUTANEOUS | 2 refills | Status: AC

## 2024-09-04 NOTE — Addendum Note (Signed)
 Addended by: WATT RAISIN C on: 09/04/2024 11:41 AM   Modules accepted: Orders

## 2024-09-07 ENCOUNTER — Other Ambulatory Visit (INDEPENDENT_AMBULATORY_CARE_PROVIDER_SITE_OTHER): Admitting: Pharmacist

## 2024-09-07 DIAGNOSIS — E119 Type 2 diabetes mellitus without complications: Secondary | ICD-10-CM

## 2024-09-07 DIAGNOSIS — I10 Essential (primary) hypertension: Secondary | ICD-10-CM

## 2024-09-07 DIAGNOSIS — Z7985 Long-term (current) use of injectable non-insulin antidiabetic drugs: Secondary | ICD-10-CM

## 2024-09-07 DIAGNOSIS — E7800 Pure hypercholesterolemia, unspecified: Secondary | ICD-10-CM

## 2024-09-07 MED ORDER — HYDRALAZINE HCL 25 MG PO TABS: 25.0000 mg | ORAL_TABLET | Freq: Three times a day (TID) | ORAL | 0 refills | Status: DC

## 2024-09-07 NOTE — Progress Notes (Addendum)
 Timothy Chapman                                          MRN: [id number]   09/07/2024   The [facility] Quality Team Specialist reviewed this patient medical record for the purposes of chart review for care gap closure. The following were reviewed: chart review for care gap closure-controlling blood pressure.  10/20/2024- no cbp to close 2025    [facility] Quality Team

## 2024-09-07 NOTE — Progress Notes (Signed)
 "  09/07/2024 Name: Timothy Chapman MRN: [id number] DOB: March 26, 1946  Chief Complaint  Patient presents with   Hypertension   Diabetes    Timothy Chapman is a 78 y.o. year old male. [facility] has been referred for Medication Assistance, diabetes, hypertension and hyperlipidemia.   Diabetes: Noted complications - retinopathy, neuropathy, nephropathy  Current Therapy: Mounjaro  12.5mg  weekly - getting thru [place]; Metformin  1000mg  twice a day   He had requested Rx from our office for Mounjaro  because he did not receive his refill from [place] yet. Patient reports cost was > $200. He did purchase it but will go back to getting from [place] since cost is only $11 / month.   Past Thereapies Tried: Farxiga  - caused genital infection / irritation; glipizide  - caused hypoglycemia, Victoza  - did not get A1c / blood glucose to goals; Ozempic  - did not get A1c / blood glucose to goals   He is using Libre 3+ Continuous Glucose Monitor sensors  Reviewed Continuous Glucose Monitor report today.  CGM Documentation: 08/25/2024 to 09/07/2024 Days Worn: 14 (recommend 14 days) % Time CGM is active: 95% (goal >=70%) Average Glucose: 142 mg/dL Glucose Management Indicator: 6.7% Glucose Variability: 17.1% (goal <36%) Time in Range:  - Time above range >250: 0% (typical goal: <5%) - Time above range 181-250: 4% (typical goal <20%) - Time in range 70-180: 96% (typical goal >=70%) - Time below range 54-69: 0% (typical goal <4%) - Time below range: 0% (typical goal <1%)  Glucose trends: Blood glucose continues to improve over the last 2 weeks with GMI of 6.7% compared to A1c from [place] of 8.0%.   Exercise: has restarted exercising some but is  awaiting hip replacement surgery and also possibly back surgery.    Macrovascular and Microvascular Risk Reduction:  Statin? no; patient previously intolerant to statin therapy; Taking Repatha  ACEi/ARB? yes (valsartan  320mg  daily ) Last urinary albumin/creatinine ratio:  Lab  Results  Component Value Date   MICRALBCREAT 175.9 04/09/2024   Last eye exam:  Lab Results  Component Value Date   HMDIABEYEEXA No Retinopathy 07/23/2023   Last foot exam: 04/27/2024 Tobacco Use:  Tobacco Use: Medium Risk (08/19/2024)   Patient History    Smoking Tobacco Use: Former    Smokeless Tobacco Use: Never    Passive Exposure: Not on file    Last vitamin B12 and Folate Lab Results  Component Value Date   VITAMINB12 1,438 (H) 11/21/2022   FOLATE >24.4 04/26/2021    Last office weight was 234 lbs- has lost about 18 lbs since 11/2022 when he started Mounjaro . Starting weight was 252 lbs Starting BMI = 37.2  Wt Readings from Last 3 Encounters:  08/19/24 234 lb 6.4 oz (106.3 kg)  07/01/24 235 lb (106.6 kg)  06/24/24 235 lb 6.4 oz (106.8 kg)     Hypertension:  Current medications: valsartan  320mg  daily, hydralazine  10mg  3 times a day  Dr Kerin recently prescribed spironolactone  but patient has declined to start due to potential anti-andogenic effects and lowering of testosterone .   Medications previously tried:  Amlodipine  - caused falls / dizziness Felodipine  - lethargy Hydrochlorothiazide  - patient declined to take - he worries about side effects like dehydration and states he does not need a fluid pill because he has not had any fluid retention.  Metoprolol  - felt like a zombie Labetalol  - stopped 11/2017 - patient did not remember why Hydralazine  - took for a short time but not sure why stopped (edema? But was also taking amlodipine   at the time as well)   Patient has a validated, automated, upper arm home BP cuff Current blood pressure readings readings: 172/98 last night; today 176/94 home blood pressure reading Lowest reading recently 157/88.   Patient denies hypotensive s/sx including no dizziness, lightheadedness.  Patient reports he had a headache the first few days after he started hydralazine  but that has improved. He denies other hypertensive  symptoms including no chest pain or shortness of breath  BP Readings from Last 3 Encounters:  08/19/24 (!) 162/100  06/24/24 (!) 150/86  05/25/24 (!) 179/115    Hyperlipidemia:  Continues to use Repatha  - using [employer] for cost - grant good thru 08/27/2024   Patient noted to be intolerant to statins - caused muscle aches and cramps. Dr Watt did use statin exclusion code at previous office visit in 2025.  Past statins tried - rosuvastatin and atorvastatin and both cause muscle pain / cramps  Lipid panel checked at [facility] 04/09/2024 showed slightly elevated triglycerides but have improved - has been as high as 815 in past. LDL was at goal.     Objective:  Lab Results  Component Value Date   HGBA1C 7.1 (H) 08/24/2024    Lab Results  Component Value Date   CREATININE 1.30 10/24/2023   BUN 25 (H) 10/24/2023   NA 139 10/24/2023   K 4.3 10/24/2023   CL 106 10/24/2023   CO2 23 10/24/2023    Lab Results  Component Value Date   CHOL 101 04/09/2024   HDL 31 (A) 04/09/2024   LDLCALC 54 04/09/2024   LDLDIRECT 106.0 08/16/2021   TRIG 205 (A) 04/09/2024   CHOLHDL 3 10/24/2023    Medications Reviewed Today     Reviewed by Carla Milling, RPH-CPP (Pharmacist) on 09/07/24 at [phone]  Med List Status: <None>   Medication Order Taking? Sig Documenting Provider Last Dose Status Informant  Acetylcarnitine HCl 500 MG CAPS [id number]  Take 500 mg by mouth daily. [provider]  Active Self           Med Note FELIPE, MELISSA B   Thu Nov 21, 2017 12:03 PM)    Alcohol  Swabs  (B-D SINGLE USE SWABS  REGULAR) PADS [id number]  USE PRIOR TO INSULIN  DOSE Copland, Harlene BROCKS, MD  Active   Alpha-Lipoic Acid 600 MG CAPS [id number]  Take 600 mg by mouth daily. [provider]  Active Self           Med Note JUSTINO, Janete Quilling B   Tue Apr 04, 2021  1:16 PM)    b complex vitamins capsule [id number]  Take 1 capsule by mouth daily. [provider]  Active   Continuous Glucose  Sensor (FREESTYLE LIBRE 3 PLUS SENSOR) MISC [id number]  USE TO CHECK BLOOD GLUCOSE CONTINUOUSLY. CHANGE       SENSOR EVERY 15DAYS Copland, Harlene BROCKS, MD  Active   diclofenac  (VOLTAREN ) 75 MG EC tablet [id number]  Take 1 tablet (75 mg total) by mouth 2 (two) times daily. Copland, Harlene BROCKS, MD  Active   Evolocumab  (REPATHA  SURECLICK) 140 MG/ML SOAJ [id number]  Inject 140 mg into the skin every 14 (fourteen) days. Copland, Harlene BROCKS, MD  Active   famotidine  (PEPCID ) 20 MG tablet [id number]  Take 1 tablet (20 mg total) by mouth 2 (two) times daily. Copland, Harlene BROCKS, MD  Active   glucose blood (ONETOUCH VERIO) test strip [id number]  USE TO CHECK BLOOD GLUCOSE TWO TIMES A DAY Copland, Harlene BROCKS, MD  Active   hydrALAZINE  (  APRESOLINE ) 10 MG tablet [id number]  Take 1 tablet (10 mg total) by mouth 3 (three) times daily. Copland, Harlene BROCKS, MD  Active   Lancets [facility] DELICA PLUS [place]) MISC [id number]  USE TWO TIMES A DAY TO     CHECK BLOOD SUGAR Copland, Harlene BROCKS, MD  Active   melatonin 5 MG TABS [id number]  Take 5 mg by mouth at bedtime.  [provider]  Active Self  metFORMIN  (GLUCOPHAGE -XR) 500 MG 24 hr tablet [id number]  Take 2 tablets (1,000 mg total) by mouth 2 (two) times daily with a meal. Copland, Harlene BROCKS, MD  Active   Multiple Vitamin (MULTIVITAMIN) tablet [id number]  Take 1 tablet by mouth daily. [provider]  Active Self  niacin  (NIASPAN ) 1000 MG CR tablet [id number]  Take 500 mg by mouth 2 (two) times daily. [provider]  Active            Med Note (GARNER, TIFFANY L   Tue Jun 23, 2024  1:41 PM)    omeprazole  (PRILOSEC) 40 MG capsule [id number]  Take 1 capsule (40 mg total) by mouth daily. Copland, Harlene BROCKS, MD  Active   OVER THE COUNTER MEDICATION [id number]  Take 600 mg by mouth daily. NAD Plus (nicotinamide adenine dinucleotide) [provider]  Active   spironolactone  (ALDACTONE ) 25 MG tablet [id number]  Take 0.5 tablets (12.5 mg total) by mouth daily.  Revankar, Jennifer SAUNDERS, MD  Active   tadalafil  (CIALIS ) 10 MG tablet [id number]  Take 1-2 tablets (10-20 mg total) by mouth every other day as needed for erectile dysfunction. Copland, Harlene BROCKS, MD  Active   tadalafil  (CIALIS ) 5 MG tablet [id number]  Take 1 tablet (5 mg total) by mouth daily. Copland, Harlene BROCKS, MD  Active   tirzepatide  (MOUNJARO ) 12.5 MG/0.5ML Pen [id number]  Inject 12.5 mg into the skin once a week. Copland, Harlene BROCKS, MD  Active   valsartan  (DIOVAN ) 320 MG tablet [id number]  Take 1 tablet (320 mg total) by mouth daily. Copland, Harlene BROCKS, MD  Active   VITAMIN D -VITAMIN K PO [id number]  Take 1 tablet by mouth daily. [provider]  Active   zinc gluconate 50 MG tablet [id number]  Take 50 mg by mouth daily. [provider]  Active            Approved for Hypercholesteremia [employer] - 08/28/2024 thru 08/27/2025 [employer] ID: [id number] Pharmacy Card No: [id number] BIN: [id number] PCN: [id number] Group: [id number]    Assessment/Plan:  Type 2 DM -  Last A1c was improved; GMI from [facility] report shows blood glucose has been at goal for the last 2 weeks.   - Continue Mounjaro  12.5mg  weekly.  - Mounjaro  thru his [facility] plan. Mounjaro  is tier 3 / preferred brand but his copay for preferred brand is 25% of medication cost which would be about $250 / month.  It is more cost effective for him to continue to get Mounjaro  thru the [place].  - Continue low carbohydrate diet. - Continue metformin  ER 500mg  - take 2 tablets twice a day - Continue to use Libre 3+ sensor to check blood glucose. Reviewed report with patient  Hyperlipidemia - LDL at goal; Tg were slightly elevated but improved; HDLC still below goal of > 40.  History of statin intolerance due to muscle pain - Continue Repatha  every 14 days - [employer] [place] has been approved thry 08/27/2025   Hypertension: Currently uncontrolled with multiple medications tried and experienced side effects or patient  concerned about potential side effects.  - Reviewed long term cardiovascular and renal outcomes of uncontrolled blood pressure - Continue to to check home blood pressure and heart rate daily  -  Increase hydralazine  to 25mg  3 times a day - Continue valsartan  320mg  daily   - Discussed other alternatives like clonidine or retrial of low dose beta blocker - Nevivolol or carvedilol would be options to try (lowest risk of effects on ED) .    Follow Up Plan: follow up in 2 weeks.    Madelin Ray, PharmD Clinical Pharmacist [facility]    "

## 2024-09-21 ENCOUNTER — Other Ambulatory Visit (INDEPENDENT_AMBULATORY_CARE_PROVIDER_SITE_OTHER): Admitting: Pharmacist

## 2024-09-21 DIAGNOSIS — E1140 Type 2 diabetes mellitus with diabetic neuropathy, unspecified: Secondary | ICD-10-CM

## 2024-09-21 DIAGNOSIS — I10 Essential (primary) hypertension: Secondary | ICD-10-CM

## 2024-09-21 MED ORDER — HYDRALAZINE HCL 50 MG PO TABS: 50.0000 mg | ORAL_TABLET | Freq: Three times a day (TID) | ORAL | 0 refills | Status: DC

## 2024-09-21 NOTE — Progress Notes (Signed)
 "  09/21/2024 Name: Timothy Chapman MRN: [id number] DOB: Jan 25, 1946  Chief Complaint  Patient presents with   Hypertension   Diabetes    Timothy Chapman is a 79 y.o. year old male. [facility] has been referred for Medication Assistance, diabetes, hypertension and hyperlipidemia.   Diabetes: Noted complications - retinopathy, neuropathy, nephropathy  Current Therapy: Mounjaro  12.5mg  weekly - getting thru [place]; Metformin  1000mg  twice a day   He had requested Rx from our office for Mounjaro  because he did not receive his refill from [place] yet. He has 1 more injection of Mounjaro . Patient reports cost at [facility] with his Medicare plan was > $200. He did purchase it once but will go back to getting from [place] since cost is only $11 / month.   Past Thereapies Tried: Farxiga  - caused genital infection / irritation; glipizide  - caused hypoglycemia, Victoza  - did not get A1c / blood glucose to goals; Ozempic  - did not get A1c / blood glucose to goals   He is using Libre 3+ Continuous Glucose Monitor sensors  Reviewed Continuous Glucose Monitor report today.  CGM Documentation: 09/08/2024 to 09/21/2024 Days Worn: 14 (recommend 14 days) % Time CGM is active: 96% (goal >=70%) Average Glucose: 153 mg/dL Glucose Management Indicator: 7.0% Glucose Variability: 18.2% (goal <36%) Time in Range:  - Time above range >250: 0% (typical goal: <5%) - Time above range 181-250: 15% (typical goal <20%) - Time in range 70-180: 84% (typical goal >=70%) - Time below range 54-69: 1% (typical goal <4%) - Time below range: 0% (typical goal <1%)  Glucose trends: Blood glucose has been stable over the last 2 weeks with GMI of 7.0% compared to A1c from [place] of 8.0%.   Exercise: has restarted exercising some but is awaiting hip replacement surgery and also possibly back surgery.    Macrovascular and Microvascular Risk Reduction:  Statin? no; patient previously intolerant to statin therapy; Taking Repatha  - reports that  pharmacy filled Repatha  12/27/ but was not able to use copay assistance / healthwell grant ACEi/ARB? yes (valsartan  320mg  daily ) Last urinary albumin/creatinine ratio:  Lab Results  Component Value Date   MICRALBCREAT 175.9 04/09/2024   Last eye exam:  Lab Results  Component Value Date   HMDIABEYEEXA No Retinopathy 07/23/2023   Last foot exam: 04/27/2024 Tobacco Use:  Tobacco Use: Medium Risk (08/19/2024)   Patient History    Smoking Tobacco Use: Former    Smokeless Tobacco Use: Never    Passive Exposure: Not on file    Last vitamin B12 and Folate Lab Results  Component Value Date   VITAMINB12 1,438 (H) 11/21/2022   FOLATE >24.4 04/26/2021    Last office weight was 234 lbs- has lost about 18 lbs since 11/2022 when he started Mounjaro . Starting weight was 252 lbs Starting BMI = 37.2  Wt Readings from Last 3 Encounters:  08/19/24 234 lb 6.4 oz (106.3 kg)  07/01/24 235 lb (106.6 kg)  06/24/24 235 lb 6.4 oz (106.8 kg)     Hypertension:  Current medications: valsartan  320mg  daily, hydralazine  25mg  3 times a day  Dr Kerin recently prescribed spironolactone  but patient has declined to start due to potential anti-andogenic effects and lowering of testosterone .   Medications previously tried:  Amlodipine  - caused falls / dizziness Felodipine  - lethargy Hydrochlorothiazide  - patient declined to take - he worries about side effects like dehydration and states he does not need a fluid pill because he has not had any fluid retention.  Metoprolol  - felt  like a zombie Labetalol  - stopped 11/2017 - patient did not remember why Hydralazine  - took for a short time but not sure why stopped (edema? But was also taking amlodipine  at the time as well)   Patient has a validated, automated, upper arm home BP cuff Current blood pressure readings readings:  today 190/93 home blood pressure reading; was 169/94 on 09/19/2024  Patient denies hypotensive s/sx including no dizziness,  lightheadedness.   BP Readings from Last 3 Encounters:  08/19/24 (!) 162/100  06/24/24 (!) 150/86  05/25/24 (!) 179/115    Hyperlipidemia:  Continues to use Repatha  - using [employer] for cost - grant good thru 08/27/2025  Patient noted to be intolerant to statins - caused muscle aches and cramps. Dr Watt did use statin exclusion code at previous office visit in 2025.  Past statins tried - rosuvastatin and atorvastatin and both cause muscle pain / cramps  Lipid panel checked at [facility] 04/09/2024 showed slightly elevated triglycerides but have improved - has been as high as 815 in past. LDL was at goal.     Objective:  Lab Results  Component Value Date   HGBA1C 7.1 (H) 08/24/2024    Lab Results  Component Value Date   CREATININE 1.30 10/24/2023   BUN 25 (H) 10/24/2023   NA 139 10/24/2023   K 4.3 10/24/2023   CL 106 10/24/2023   CO2 23 10/24/2023    Lab Results  Component Value Date   CHOL 101 04/09/2024   HDL 31 (A) 04/09/2024   LDLCALC 54 04/09/2024   LDLDIRECT 106.0 08/16/2021   TRIG 205 (A) 04/09/2024   CHOLHDL 3 10/24/2023    Medications Reviewed Today     Reviewed by Carla Milling, RPH-CPP (Pharmacist) on 09/21/24 at 1040  Med List Status: <None>   Medication Order Taking? Sig Documenting Provider Last Dose Status Informant  Acetylcarnitine HCl 500 MG CAPS [id number] Yes Take 500 mg by mouth daily. [provider]  Active Self           Med Note FELIPE, MELISSA B   Thu Nov 21, 2017 12:03 PM)    Alcohol  Swabs  (B-D SINGLE USE SWABS  REGULAR) PADS [id number] Yes USE PRIOR TO INSULIN  DOSE Copland, Harlene BROCKS, MD  Active   Alpha-Lipoic Acid 600 MG CAPS [id number] Yes Take 600 mg by mouth daily. [provider]  Active Self           Med Note JUSTINO, Lulie Hurd B   Tue Apr 04, 2021  1:16 PM)    b complex vitamins capsule [id number]  Take 1 capsule by mouth daily. [provider]  Active   Continuous Glucose Sensor (FREESTYLE LIBRE 3 PLUS  SENSOR) MISC [id number] Yes USE TO CHECK BLOOD GLUCOSE CONTINUOUSLY. CHANGE       SENSOR EVERY 15DAYS Copland, Harlene BROCKS, MD  Active   diclofenac  (VOLTAREN ) 75 MG EC tablet [id number]  Take 1 tablet (75 mg total) by mouth 2 (two) times daily. Copland, Harlene BROCKS, MD  Active   Evolocumab  (REPATHA  SURECLICK) 140 MG/ML SOAJ [id number] Yes Inject 140 mg into the skin every 14 (fourteen) days. Copland, Harlene BROCKS, MD  Active   famotidine  (PEPCID ) 20 MG tablet [id number] Yes Take 1 tablet (20 mg total) by mouth 2 (two) times daily. Copland, Harlene BROCKS, MD  Active   glucose blood (ONETOUCH VERIO) test strip [id number] Yes USE TO CHECK BLOOD GLUCOSE TWO TIMES A DAY Copland, Harlene BROCKS, MD  Active   hydrALAZINE  (APRESOLINE ) 25 MG tablet  [id number] Yes Take 1 tablet (25 mg total) by mouth 3 (three) times daily. Copland, Harlene BROCKS, MD  Active   Lancets [facility] DELICA PLUS [place]) MISC [id number] Yes USE TWO TIMES A DAY TO     CHECK BLOOD SUGAR Copland, Harlene BROCKS, MD  Active   melatonin 5 MG TABS [id number]  Take 5 mg by mouth at bedtime.  [provider]  Active Self  metFORMIN  (GLUCOPHAGE -XR) 500 MG 24 hr tablet [id number] Yes Take 2 tablets (1,000 mg total) by mouth 2 (two) times daily with a meal. Copland, Harlene BROCKS, MD  Active   Multiple Vitamin (MULTIVITAMIN) tablet [id number]  Take 1 tablet by mouth daily. [provider]  Active Self  niacin  (NIASPAN ) 1000 MG CR tablet [id number] Yes Take 500 mg by mouth 2 (two) times daily. [provider]  Active            Med Note (GARNER, TIFFANY L   Tue Jun 23, 2024  1:41 PM)    omeprazole  (PRILOSEC) 40 MG capsule [id number] Yes Take 1 capsule (40 mg total) by mouth daily. Copland, Harlene BROCKS, MD  Active   OVER THE COUNTER MEDICATION [id number]  Take 600 mg by mouth daily. NAD Plus (nicotinamide adenine dinucleotide) [provider]  Active     Discontinued 09/21/24 1039 (Patient Preference)   tadalafil  (CIALIS ) 10 MG tablet [id number]  Take 1-2  tablets (10-20 mg total) by mouth every other day as needed for erectile dysfunction. Copland, Harlene BROCKS, MD  Active   tadalafil  (CIALIS ) 5 MG tablet [id number] Yes Take 1 tablet (5 mg total) by mouth daily. Copland, Jessica C, MD  Active   tirzepatide  (MOUNJARO ) 12.5 MG/0.5ML Pen [id number] Yes Inject 12.5 mg into the skin once a week. Copland, Harlene BROCKS, MD  Active   valsartan  (DIOVAN ) 320 MG tablet [id number] Yes Take 1 tablet (320 mg total) by mouth daily. Copland, Harlene BROCKS, MD  Active   VITAMIN D -VITAMIN K PO [id number] Yes Take 1 tablet by mouth daily. [provider]  Active   zinc gluconate 50 MG tablet [id number] Yes Take 50 mg by mouth daily. [provider]  Active            Approved for Hypercholesteremia [employer] - 08/28/2024 thru 08/27/2025 [employer] ID: [id number] Pharmacy Card No: [id number] BIN: [id number] PCN: [id number] Group: [id number]    Assessment/Plan:  Type 2 DM -  Last A1c was improved; GMI from [facility] report shows blood glucose has been at goal for the last 2 weeks.   - Continue Mounjaro  12.5mg  weekly.  - Mounjaro  thru his [facility] plan. Mounjaro  is tier 3 / preferred brand but his copay for preferred brand is 25% of medication cost which would be about $250 / month.  It is more cost effective for him to continue to get Mounjaro  thru the [place].  - Continue low carbohydrate diet. - Continue metformin  ER 500mg  - take 2 tablets twice a day - Continue to use Libre 3+ sensor to check blood glucose. Reviewed report with patient  Hyperlipidemia - LDL at goal; Tg were slightly elevated but improved; HDLC still below goal of > 40.  History of statin intolerance due to muscle pain - Continue Repatha  every 14 days - [employer] has been approved thry 08/27/2025 - coordinated with [facility] pharmacy to use his newest [employer] for    Hypertension: Currently uncontrolled with multiple medications tried and experienced side effects or patient  concerned about potential side effects.  -  Reviewed long term cardiovascular and renal outcomes of uncontrolled blood pressure - Continue to to check home blood pressure and heart rate daily  -  Increase hydralazine  to 50mg  3 times a day - Continue valsartan  320mg  daily   - Discussed other options we could try in future -  clonidine or retrial of low dose beta blocker - Nevivolol or carvedilol would be options to try (lowest risk of effects on ED) .    Follow Up Plan: follow up in 2 weeks.    Madelin Ray, PharmD Clinical Pharmacist [facility]    "

## 2024-10-01 ENCOUNTER — Other Ambulatory Visit: Payer: Self-pay | Admitting: Family Medicine

## 2024-10-01 DIAGNOSIS — M2550 Pain in unspecified joint: Secondary | ICD-10-CM

## 2024-10-02 ENCOUNTER — Encounter: Payer: Self-pay | Admitting: Family Medicine

## 2024-10-02 DIAGNOSIS — E1140 Type 2 diabetes mellitus with diabetic neuropathy, unspecified: Secondary | ICD-10-CM

## 2024-10-02 DIAGNOSIS — K219 Gastro-esophageal reflux disease without esophagitis: Secondary | ICD-10-CM

## 2024-10-02 MED ORDER — VALSARTAN 320 MG PO TABS: 320.0000 mg | ORAL_TABLET | Freq: Every day | ORAL | 3 refills | Status: AC

## 2024-10-02 MED ORDER — FAMOTIDINE 20 MG PO TABS: 20.0000 mg | ORAL_TABLET | Freq: Two times a day (BID) | ORAL | 1 refills | Status: AC

## 2024-10-02 MED ORDER — METFORMIN HCL ER 500 MG PO TB24: 1000.0000 mg | ORAL_TABLET | Freq: Two times a day (BID) | ORAL | 3 refills | Status: AC

## 2024-10-05 ENCOUNTER — Encounter: Payer: Self-pay | Admitting: Family Medicine

## 2024-10-06 ENCOUNTER — Other Ambulatory Visit: Payer: Self-pay | Admitting: Pharmacist

## 2024-10-06 ENCOUNTER — Encounter: Payer: Self-pay | Admitting: Pharmacist

## 2024-10-06 DIAGNOSIS — I10 Essential (primary) hypertension: Secondary | ICD-10-CM

## 2024-10-06 NOTE — Progress Notes (Signed)
 "  10/06/2024 Name: Timothy Chapman MRN: [id number] DOB: 05/04/1946  Chief Complaint  Patient presents with   Hypertension   Diabetes    Timothy Chapman is a 79 y.o. year old male. [place] has been referred for Medication Assistance, diabetes, hypertension and hyperlipidemia.   Diabetes: Noted complications - retinopathy, neuropathy, nephropathy  Current Therapy: Mounjaro  12.5mg  weekly - getting thru [place]; Metformin  1000mg  twice a day   He had requested Rx from our office for Mounjaro  because he did not receive his refill from [place] yet. He has 1 more injection of Mounjaro . Patient reports cost at [facility] with his Medicare plan was > $200. He did purchase it once but will go back to getting from [place] since cost is only $11 / month.   Past Thereapies Tried: Farxiga  - caused genital infection / irritation; glipizide  - caused hypoglycemia, Victoza  - did not get A1c / blood glucose to goals; Ozempic  - did not get A1c / blood glucose to goals   He is using Libre 3+ Continuous Glucose Monitor sensors  Reviewed Continuous Glucose Monitor report today.  CGM Documentation: 09/23/2024 to 10/06/2024 Days Worn: 14 (recommend 14 days) % Time CGM is active: 96% (goal >=70%) Average Glucose: 167 mg/dL Glucose Management Indicator: 7.3% Glucose Variability: 11.1% (goal <36%) Time in Range:  - Time above range >250: 0% (typical goal: <5%) - Time above range 181-250: 23% (typical goal <20%) - Time in range 70-180: 77% (typical goal >=70%) - Time below range 54-69: 0% (typical goal <4%) - Time below range: 0% (typical goal <1%)  Glucose trends: Blood glucose has been stable over the last 2 weeks with GMI of 7.3%. No noticeable increases in blood glucose after meals but blood glucose stays mostly just under 180.   Exercise: has restarted exercising with some walking but is awaiting hip replacement surgery and also possibly back surgery.    Macrovascular and Microvascular Risk Reduction:  Statin? no;  patient previously intolerant to statin therapy; Taking Repatha  - reports that pharmacy filled Repatha  12/27/ but was not able to use copay assistance / healthwell grant ACEi/ARB? yes (valsartan  320mg  daily ) Last urinary albumin/creatinine ratio:  Lab Results  Component Value Date   MICRALBCREAT 175.9 04/09/2024   Last eye exam:  Lab Results  Component Value Date   HMDIABEYEEXA No Retinopathy 07/23/2023   Last foot exam: 04/27/2024 Tobacco Use:  Tobacco Use: Medium Risk (10/06/2024)   Patient History    Smoking Tobacco Use: Former    Smokeless Tobacco Use: Never    Passive Exposure: Not on file     Last office weight was 234 lbs- has lost about 18 lbs since 11/2022 when he started Mounjaro . Today patient states he has lost about 5lbs since the new year - eating better / low carb and increasing exercise  Starting weight was 252 lbs Starting BMI = 37.2  Wt Readings from Last 3 Encounters:  08/19/24 234 lb 6.4 oz (106.3 kg)  07/01/24 235 lb (106.6 kg)  06/24/24 235 lb 6.4 oz (106.8 kg)     Hypertension:  Current medications: valsartan  320mg  daily, hydralazine  50mg  3 times a day  Dr Kerin recently prescribed spironolactone  but patient has declined to start due to potential anti-andogenic effects and lowering of testosterone .   Medications previously tried:  Amlodipine  - caused falls / dizziness Felodipine  - lethargy Hydrochlorothiazide  - patient declined to take - he worries about side effects like dehydration and states he does not need a fluid pill because he has  not had any fluid retention.  Metoprolol  - felt like a zombie Labetalol  - stopped 11/2017 - patient did not remember why Hydralazine  - took for a short time but not sure why stopped (edema? But was also taking amlodipine  at the time as well)   Patient has an automated, upper arm home BP cuff but today he states he is not sure of its accuracy.  Current blood pressure readings readings:  today 175/103. He  also checked blood pressure 10/03/2024 and report blood pressure was 158/80.  He took his first dose of hydralazine  at 10:45am this morning which was about 10 minutes before he checked his blood pressure. His last dose yesterday was around 10pm which means there was more than 12 hours between doses.   Patient denies hypotensive s/sx including no dizziness, lightheadedness. He states he sometimes gets a mild headache after he takes hydralazine .   BP Readings from Last 3 Encounters:  10/06/24 (!) 175/103  08/19/24 (!) 162/100  06/24/24 (!) 150/86    Hyperlipidemia:  Continues to use Repatha  - using [employer] for cost - grant good thru 08/27/2025  Patient noted to be intolerant to statins - caused muscle aches and cramps. Dr Watt did use statin exclusion code at previous office visit in 2025.  Past statins tried - rosuvastatin and atorvastatin and both cause muscle pain / cramps  Lipid panel checked at [facility] 04/09/2024 showed slightly elevated triglycerides but have improved - has been as high as 815 in past. LDL was at goal.     Objective:  Lab Results  Component Value Date   HGBA1C 7.1 (H) 08/24/2024    Lab Results  Component Value Date   CREATININE 1.30 10/24/2023   BUN 25 (H) 10/24/2023   NA 139 10/24/2023   K 4.3 10/24/2023   CL 106 10/24/2023   CO2 23 10/24/2023    Lab Results  Component Value Date   CHOL 101 04/09/2024   HDL 31 (A) 04/09/2024   LDLCALC 54 04/09/2024   LDLDIRECT 106.0 08/16/2021   TRIG 205 (A) 04/09/2024   CHOLHDL 3 10/24/2023    Medications Reviewed Today     Reviewed by Carla Milling, RPH-CPP (Pharmacist) on 10/06/24 at 1640  Med List Status: <None>   Medication Order Taking? Sig Documenting Provider Last Dose Status Informant  Acetylcarnitine HCl 500 MG CAPS [id number] Yes Take 500 mg by mouth daily. [provider]  Active Self           Med Note FELIPE, MELISSA B   Thu Nov 21, 2017 12:03 PM)    Alcohol  Swabs  (B-D SINGLE USE  SWABS  REGULAR) PADS [id number] Yes USE PRIOR TO INSULIN  DOSE Copland, Harlene BROCKS, MD  Active   Alpha-Lipoic Acid 600 MG CAPS [id number] Yes Take 600 mg by mouth daily. [provider]  Active Self           Med Note JUSTINO, Irina Okelly B   Tue Apr 04, 2021  1:16 PM)    b complex vitamins capsule [id number] Yes Take 1 capsule by mouth daily. [provider]  Active   Bromfenac Sodium 0.09 % SOLN [id number]  INSTILL 1 drop in the LEFT eye once a day. Start drops after your arrive home from surgery.  Patient not taking: Reported on 10/06/2024   [provider]  Active   Continuous Glucose Sensor (FREESTYLE LIBRE 3 PLUS SENSOR) MISC [id number] Yes USE TO CHECK BLOOD GLUCOSE CONTINUOUSLY. CHANGE       SENSOR EVERY 15DAYS Copland,  Harlene BROCKS, MD  Active   diclofenac  (VOLTAREN ) 75 MG EC tablet [id number] Yes TAKE 1 TABLET TWICE A DAY Copland, Harlene BROCKS, MD  Active   Evolocumab  (REPATHA  SURECLICK) 140 MG/ML SOAJ [id number] Yes Inject 140 mg into the skin every 14 (fourteen) days. Copland, Harlene BROCKS, MD  Active   famotidine  (PEPCID ) 20 MG tablet [id number] Yes Take 1 tablet (20 mg total) by mouth 2 (two) times daily. Copland, Harlene BROCKS, MD  Active   glucose blood (ONETOUCH VERIO) test strip [id number] Yes USE TO CHECK BLOOD GLUCOSE TWO TIMES A DAY Copland, Harlene BROCKS, MD  Active   hydrALAZINE  (APRESOLINE ) 25 MG tablet [id number] Yes Take 1 tablet (25 mg total) by mouth 3 (three) times daily. Take along with 50mg  tablets to equal total dose of 75mg  3 times a day Copland, Harlene BROCKS, MD  Active   hydrALAZINE  (APRESOLINE ) 50 MG tablet [id number] Yes Take 1 tablet (50 mg total) by mouth 3 (three) times daily. Copland, Harlene BROCKS, MD  Active   Lancets [facility] DELICA PLUS [place]) MISC [id number] Yes USE TWO TIMES A DAY TO     CHECK BLOOD SUGAR Copland, Harlene BROCKS, MD  Active   melatonin 5 MG TABS [id number] Yes Take 5 mg by mouth at bedtime.  [provider]  Active Self  metFORMIN  (GLUCOPHAGE -XR)  500 MG 24 hr tablet [id number] Yes Take 2 tablets (1,000 mg total) by mouth 2 (two) times daily with a meal. Copland, Harlene BROCKS, MD  Active   moxifloxacin (VIGAMOX) 0.5 % ophthalmic solution [id number]  INSTILL 1 drop in the left eye 4 times a day. Start the drop after you arrive home from surgery.  Patient not taking: Reported on 10/06/2024   [provider]  Active   Multiple Vitamin (MULTIVITAMIN) tablet [id number] Yes Take 1 tablet by mouth daily. [provider]  Active Self  niacin  (NIASPAN ) 1000 MG CR tablet [id number] Yes Take 500 mg by mouth 2 (two) times daily. [provider]  Active            Med Note (GARNER, TIFFANY L   Tue Jun 23, 2024  1:41 PM)    omeprazole  (PRILOSEC) 40 MG capsule [id number] Yes Take 1 capsule (40 mg total) by mouth daily. Copland, Harlene BROCKS, MD  Active   OVER THE COUNTER MEDICATION [id number] Yes Take 600 mg by mouth daily. NAD Plus (nicotinamide adenine dinucleotide) [provider]  Active   prednisoLONE acetate (PRED FORTE) 1 % ophthalmic suspension [id number] Yes Instill 1 drop in the left eye 4 times a day. Start after you arrive home from surgery. [provider]  Active   tadalafil  (CIALIS ) 10 MG tablet [id number] Yes Take 1-2 tablets (10-20 mg total) by mouth every other day as needed for erectile dysfunction. Copland, Harlene BROCKS, MD  Active   tadalafil  (CIALIS ) 5 MG tablet [id number] Yes Take 1 tablet (5 mg total) by mouth daily. Copland, Harlene BROCKS, MD  Active   tirzepatide  (MOUNJARO ) 12.5 MG/0.5ML Pen [id number] Yes Inject 12.5 mg into the skin once a week. Copland, Harlene BROCKS, MD  Active   valsartan  (DIOVAN ) 320 MG tablet [id number] Yes Take 1 tablet (320 mg total) by mouth daily. Copland, Harlene BROCKS, MD  Active   VITAMIN D -VITAMIN K PO [id number] Yes Take 1 tablet by mouth daily. [provider]  Active   zinc gluconate 50 MG tablet [id number] Yes Take 50 mg by mouth daily. [provider]  Active  Approved for Hypercholesteremia [employer] - 08/28/2024 thru 08/27/2025 [employer] ID: [id number] Pharmacy Card No: [id number] BIN: [id number] PCN: [id number] Group: [id number]    Assessment/Plan:  Type 2 DM -  Last A1c was improved; GMI from [facility] report shows blood glucose has been at goal for the last 2 weeks.   - Continue Mounjaro  12.5mg  weekly.  - Mounjaro  thru his [facility] plan. Mounjaro  is tier 3 / preferred brand but his copay for preferred brand is 25% of medication cost which would be about $250 / month.  It is more cost effective for him to continue to get Mounjaro  thru the [place].  - Continue low carbohydrate diet. - Continue metformin  ER 500mg  - take 2 tablets twice a day - Continue to use Libre 3+ sensor to check blood glucose. Reviewed report with patient  Hyperlipidemia - LDL at goal; Tg were slightly elevated but improved; HDLC still below goal of > 40.  History of statin intolerance due to muscle pain - Continue Repatha  every 14 days - [employer] has been approved thry 08/27/2025 - coordinated with [facility] pharmacy to use his newest [employer] for    Hypertension: Currently uncontrolled with multiple medications tried and experienced side effects or patient concerned about potential side effects.  - Reviewed long term cardiovascular and renal outcomes of uncontrolled blood pressure - Continue to to check home blood pressure and heart rate daily. Discussed that he has $75/ quarter of over-the-counter benefits. Suggested he try to use over-the-counter benefits to get a new blood pressure cuff.  -  Increase hydralazine  to 75mg  3 times a day (patient will take 50mg  + 25mg  tabs 3 times per day). Also recommended the he try to take as close to every 8 hours as possible for better blood pressure control. - Continue valsartan  320mg  daily   - Discussed other options we could try in future -  clonidine or retrial of low dose beta blocker - Nevivolol or  carvedilol would be options to try (lowest risk of effects on ED) .   Follow Up Plan: follow up in 2 weeks.    Madelin Ray, PharmD Clinical Pharmacist [facility]    "

## 2024-10-12 ENCOUNTER — Other Ambulatory Visit: Payer: Self-pay | Admitting: Family Medicine

## 2024-10-13 ENCOUNTER — Other Ambulatory Visit: Payer: Self-pay | Admitting: Family Medicine

## 2024-10-14 ENCOUNTER — Other Ambulatory Visit: Payer: Self-pay | Admitting: Family Medicine

## 2024-10-16 ENCOUNTER — Other Ambulatory Visit (HOSPITAL_COMMUNITY): Payer: Self-pay

## 2024-10-16 ENCOUNTER — Encounter: Payer: Self-pay | Admitting: Cardiology

## 2024-10-16 ENCOUNTER — Telehealth: Payer: Self-pay

## 2024-10-16 ENCOUNTER — Encounter: Payer: Self-pay | Admitting: Pharmacist

## 2024-10-17 ENCOUNTER — Other Ambulatory Visit: Payer: Self-pay | Admitting: Family Medicine

## 2024-10-20 ENCOUNTER — Other Ambulatory Visit (HOSPITAL_COMMUNITY): Payer: Self-pay

## 2024-10-20 ENCOUNTER — Other Ambulatory Visit: Payer: Self-pay | Admitting: Family Medicine

## 2024-10-20 ENCOUNTER — Telehealth: Payer: Self-pay | Admitting: Pharmacist

## 2024-10-20 ENCOUNTER — Telehealth: Payer: Self-pay

## 2024-10-20 DIAGNOSIS — N529 Male erectile dysfunction, unspecified: Secondary | ICD-10-CM

## 2024-10-20 MED ORDER — HYDRALAZINE HCL 100 MG PO TABS: 100.0000 mg | ORAL_TABLET | Freq: Three times a day (TID) | ORAL | 0 refills | Status: AC

## 2024-10-26 ENCOUNTER — Other Ambulatory Visit

## 2025-03-09 ENCOUNTER — Ambulatory Visit
